# Patient Record
Sex: Male | Born: 1940
Health system: Southern US, Community
[De-identification: ages and names within clinical notes are randomized; demographics above are authoritative.]

## PROBLEM LIST (undated history)

## (undated) DIAGNOSIS — K2970 Gastritis, unspecified, without bleeding: Secondary | ICD-10-CM

## (undated) DIAGNOSIS — I2510 Atherosclerotic heart disease of native coronary artery without angina pectoris: Secondary | ICD-10-CM

## (undated) DIAGNOSIS — N400 Enlarged prostate without lower urinary tract symptoms: Secondary | ICD-10-CM

## (undated) DIAGNOSIS — I5022 Chronic systolic (congestive) heart failure: Secondary | ICD-10-CM

## (undated) DIAGNOSIS — C61 Malignant neoplasm of prostate: Secondary | ICD-10-CM

## (undated) DIAGNOSIS — I480 Paroxysmal atrial fibrillation: Secondary | ICD-10-CM

## (undated) DIAGNOSIS — K635 Polyp of colon: Secondary | ICD-10-CM

## (undated) DIAGNOSIS — G4730 Sleep apnea, unspecified: Secondary | ICD-10-CM

## (undated) DIAGNOSIS — J449 Chronic obstructive pulmonary disease, unspecified: Secondary | ICD-10-CM

## (undated) DIAGNOSIS — E785 Hyperlipidemia, unspecified: Secondary | ICD-10-CM

## (undated) DIAGNOSIS — C439 Malignant melanoma of skin, unspecified: Secondary | ICD-10-CM

## (undated) DIAGNOSIS — K750 Abscess of liver: Secondary | ICD-10-CM

## (undated) DIAGNOSIS — I5020 Unspecified systolic (congestive) heart failure: Secondary | ICD-10-CM

## (undated) DIAGNOSIS — Z87898 Personal history of other specified conditions: Secondary | ICD-10-CM

## (undated) DIAGNOSIS — E049 Nontoxic goiter, unspecified: Secondary | ICD-10-CM

## (undated) DIAGNOSIS — M1990 Unspecified osteoarthritis, unspecified site: Secondary | ICD-10-CM

## (undated) HISTORY — DX: Benign prostatic hyperplasia without lower urinary tract symptoms: N40.0

## (undated) HISTORY — DX: Chronic obstructive pulmonary disease, unspecified: J44.9

## (undated) HISTORY — DX: Hyperlipidemia, unspecified: E78.5

## (undated) HISTORY — DX: Personal history of other specified conditions: Z87.898

## (undated) HISTORY — DX: Abscess of liver: K75.0

## (undated) HISTORY — DX: Chronic systolic (congestive) heart failure: I50.22

## (undated) HISTORY — DX: Paroxysmal atrial fibrillation: I48.0

## (undated) HISTORY — DX: Nontoxic goiter, unspecified: E04.9

## (undated) HISTORY — PX: OTHER SURGICAL HISTORY: SHX169

## (undated) HISTORY — DX: Sleep apnea, unspecified: G47.30

## (undated) HISTORY — DX: Unspecified systolic (congestive) heart failure: I50.20

## (undated) HISTORY — PX: CHOLECYSTECTOMY: SHX55

## (undated) HISTORY — PX: LIVER SURGERY: SHX698

## (undated) HISTORY — DX: Unspecified osteoarthritis, unspecified site: M19.90

## (undated) HISTORY — DX: Gastritis, unspecified, without bleeding: K29.70

## (undated) HISTORY — DX: Malignant melanoma of skin, unspecified: C43.9

---

## 1999-05-30 ENCOUNTER — Encounter: Payer: Self-pay | Admitting: Family Medicine

## 1999-05-30 ENCOUNTER — Ambulatory Visit (HOSPITAL_COMMUNITY): Admission: RE | Admit: 1999-05-30 | Discharge: 1999-05-30 | Payer: Self-pay | Admitting: Family Medicine

## 1999-08-08 ENCOUNTER — Encounter: Payer: Self-pay | Admitting: Thoracic Surgery

## 1999-08-08 ENCOUNTER — Ambulatory Visit (HOSPITAL_COMMUNITY): Admission: RE | Admit: 1999-08-08 | Discharge: 1999-08-08 | Payer: Self-pay | Admitting: Thoracic Surgery

## 1999-08-11 ENCOUNTER — Encounter: Payer: Self-pay | Admitting: Thoracic Surgery

## 1999-08-18 ENCOUNTER — Encounter (INDEPENDENT_AMBULATORY_CARE_PROVIDER_SITE_OTHER): Payer: Self-pay | Admitting: Specialist

## 1999-08-18 ENCOUNTER — Encounter (INDEPENDENT_AMBULATORY_CARE_PROVIDER_SITE_OTHER): Payer: Self-pay | Admitting: *Deleted

## 1999-08-18 ENCOUNTER — Ambulatory Visit (HOSPITAL_COMMUNITY): Admission: RE | Admit: 1999-08-18 | Discharge: 1999-08-18 | Payer: Self-pay | Admitting: Thoracic Surgery

## 1999-08-18 ENCOUNTER — Encounter: Payer: Self-pay | Admitting: Thoracic Surgery

## 1999-08-18 ENCOUNTER — Emergency Department (HOSPITAL_COMMUNITY): Admission: EM | Admit: 1999-08-18 | Discharge: 1999-08-18 | Payer: Self-pay | Admitting: Emergency Medicine

## 1999-10-25 ENCOUNTER — Encounter: Admission: RE | Admit: 1999-10-25 | Discharge: 1999-10-25 | Payer: Self-pay | Admitting: Thoracic Surgery

## 1999-10-25 ENCOUNTER — Encounter: Payer: Self-pay | Admitting: Thoracic Surgery

## 2000-02-14 ENCOUNTER — Encounter: Admission: RE | Admit: 2000-02-14 | Discharge: 2000-02-14 | Payer: Self-pay | Admitting: Thoracic Surgery

## 2000-02-14 ENCOUNTER — Encounter: Payer: Self-pay | Admitting: Thoracic Surgery

## 2000-08-16 ENCOUNTER — Encounter: Payer: Self-pay | Admitting: Thoracic Surgery

## 2000-08-16 ENCOUNTER — Encounter: Admission: RE | Admit: 2000-08-16 | Discharge: 2000-08-16 | Payer: Self-pay | Admitting: Thoracic Surgery

## 2001-03-28 ENCOUNTER — Encounter: Admission: RE | Admit: 2001-03-28 | Discharge: 2001-03-28 | Payer: Self-pay | Admitting: Thoracic Surgery

## 2001-03-28 ENCOUNTER — Encounter: Payer: Self-pay | Admitting: Thoracic Surgery

## 2001-04-16 ENCOUNTER — Encounter: Admission: RE | Admit: 2001-04-16 | Discharge: 2001-04-16 | Payer: Self-pay | Admitting: Family Medicine

## 2001-04-16 ENCOUNTER — Encounter: Payer: Self-pay | Admitting: Family Medicine

## 2001-07-30 ENCOUNTER — Encounter: Admission: RE | Admit: 2001-07-30 | Discharge: 2001-07-30 | Payer: Self-pay | Admitting: General Surgery

## 2001-07-30 ENCOUNTER — Encounter: Payer: Self-pay | Admitting: General Surgery

## 2001-09-14 ENCOUNTER — Emergency Department (HOSPITAL_COMMUNITY): Admission: EM | Admit: 2001-09-14 | Discharge: 2001-09-14 | Payer: Self-pay | Admitting: Emergency Medicine

## 2002-02-18 ENCOUNTER — Encounter: Payer: Self-pay | Admitting: General Surgery

## 2002-02-18 ENCOUNTER — Encounter: Admission: RE | Admit: 2002-02-18 | Discharge: 2002-02-18 | Payer: Self-pay | Admitting: General Surgery

## 2002-10-23 ENCOUNTER — Other Ambulatory Visit: Admission: RE | Admit: 2002-10-23 | Discharge: 2002-10-23 | Payer: Self-pay | Admitting: Family Medicine

## 2003-03-10 ENCOUNTER — Encounter: Admission: RE | Admit: 2003-03-10 | Discharge: 2003-03-10 | Payer: Self-pay | Admitting: General Surgery

## 2003-03-10 ENCOUNTER — Encounter: Payer: Self-pay | Admitting: General Surgery

## 2003-07-29 ENCOUNTER — Emergency Department (HOSPITAL_COMMUNITY): Admission: AD | Admit: 2003-07-29 | Discharge: 2003-07-29 | Payer: Self-pay | Admitting: Family Medicine

## 2003-09-07 ENCOUNTER — Encounter: Admission: RE | Admit: 2003-09-07 | Discharge: 2003-09-07 | Payer: Self-pay | Admitting: Family Medicine

## 2003-11-30 ENCOUNTER — Encounter: Admission: RE | Admit: 2003-11-30 | Discharge: 2003-11-30 | Payer: Self-pay | Admitting: Family Medicine

## 2004-09-19 ENCOUNTER — Ambulatory Visit (HOSPITAL_BASED_OUTPATIENT_CLINIC_OR_DEPARTMENT_OTHER): Admission: RE | Admit: 2004-09-19 | Discharge: 2004-09-19 | Payer: Self-pay | Admitting: Plastic Surgery

## 2004-09-19 ENCOUNTER — Encounter (INDEPENDENT_AMBULATORY_CARE_PROVIDER_SITE_OTHER): Payer: Self-pay | Admitting: *Deleted

## 2004-09-19 ENCOUNTER — Ambulatory Visit (HOSPITAL_COMMUNITY): Admission: RE | Admit: 2004-09-19 | Discharge: 2004-09-19 | Payer: Self-pay | Admitting: Plastic Surgery

## 2005-04-02 ENCOUNTER — Ambulatory Visit: Admission: RE | Admit: 2005-04-02 | Discharge: 2005-04-02 | Payer: Self-pay | Admitting: Family Medicine

## 2005-04-02 DIAGNOSIS — G4730 Sleep apnea, unspecified: Secondary | ICD-10-CM

## 2005-04-02 HISTORY — DX: Sleep apnea, unspecified: G47.30

## 2005-04-23 ENCOUNTER — Ambulatory Visit: Payer: Self-pay | Admitting: Internal Medicine

## 2005-05-23 ENCOUNTER — Encounter (INDEPENDENT_AMBULATORY_CARE_PROVIDER_SITE_OTHER): Payer: Self-pay | Admitting: Internal Medicine

## 2005-05-23 ENCOUNTER — Ambulatory Visit: Payer: Self-pay | Admitting: Internal Medicine

## 2005-05-23 ENCOUNTER — Ambulatory Visit (HOSPITAL_COMMUNITY): Admission: RE | Admit: 2005-05-23 | Discharge: 2005-05-23 | Payer: Self-pay | Admitting: Internal Medicine

## 2006-07-24 ENCOUNTER — Encounter (INDEPENDENT_AMBULATORY_CARE_PROVIDER_SITE_OTHER): Payer: Self-pay | Admitting: Specialist

## 2006-07-24 ENCOUNTER — Ambulatory Visit: Payer: Self-pay | Admitting: Internal Medicine

## 2006-07-24 ENCOUNTER — Ambulatory Visit (HOSPITAL_COMMUNITY): Admission: RE | Admit: 2006-07-24 | Discharge: 2006-07-24 | Payer: Self-pay | Admitting: Internal Medicine

## 2006-07-29 ENCOUNTER — Emergency Department (HOSPITAL_COMMUNITY): Admission: EM | Admit: 2006-07-29 | Discharge: 2006-07-29 | Payer: Self-pay | Admitting: Emergency Medicine

## 2006-12-09 ENCOUNTER — Emergency Department (HOSPITAL_COMMUNITY): Admission: EM | Admit: 2006-12-09 | Discharge: 2006-12-09 | Payer: Self-pay | Admitting: Emergency Medicine

## 2006-12-25 ENCOUNTER — Ambulatory Visit (HOSPITAL_COMMUNITY): Admission: RE | Admit: 2006-12-25 | Discharge: 2006-12-25 | Payer: Self-pay | Admitting: Internal Medicine

## 2008-05-13 ENCOUNTER — Ambulatory Visit (HOSPITAL_COMMUNITY): Admission: RE | Admit: 2008-05-13 | Discharge: 2008-05-13 | Payer: Self-pay | Admitting: Internal Medicine

## 2008-05-28 DIAGNOSIS — Z87898 Personal history of other specified conditions: Secondary | ICD-10-CM

## 2008-05-28 HISTORY — DX: Personal history of other specified conditions: Z87.898

## 2008-06-28 HISTORY — PX: CARDIAC CATHETERIZATION: SHX172

## 2008-07-01 ENCOUNTER — Encounter: Admission: RE | Admit: 2008-07-01 | Discharge: 2008-07-01 | Payer: Self-pay | Admitting: Cardiology

## 2008-07-06 ENCOUNTER — Inpatient Hospital Stay (HOSPITAL_COMMUNITY): Admission: RE | Admit: 2008-07-06 | Discharge: 2008-07-07 | Payer: Self-pay | Admitting: Cardiology

## 2008-07-07 ENCOUNTER — Ambulatory Visit: Payer: Self-pay | Admitting: Vascular Surgery

## 2008-07-07 ENCOUNTER — Encounter (INDEPENDENT_AMBULATORY_CARE_PROVIDER_SITE_OTHER): Payer: Self-pay | Admitting: Cardiology

## 2008-07-28 HISTORY — PX: OTHER SURGICAL HISTORY: SHX169

## 2008-09-24 ENCOUNTER — Encounter: Admission: RE | Admit: 2008-09-24 | Discharge: 2008-09-24 | Payer: Self-pay | Admitting: Cardiology

## 2009-12-16 ENCOUNTER — Ambulatory Visit (HOSPITAL_COMMUNITY): Admission: RE | Admit: 2009-12-16 | Discharge: 2009-12-16 | Payer: Self-pay | Admitting: Internal Medicine

## 2010-06-20 ENCOUNTER — Other Ambulatory Visit: Payer: Self-pay | Admitting: Dermatology

## 2010-09-12 LAB — CBC
HCT: 44.4 % (ref 39.0–52.0)
MCHC: 34.7 g/dL (ref 30.0–36.0)
MCV: 87.8 fL (ref 78.0–100.0)
MCV: 89.3 fL (ref 78.0–100.0)
Platelets: 130 10*3/uL — ABNORMAL LOW (ref 150–400)
Platelets: 130 10*3/uL — ABNORMAL LOW (ref 150–400)
RDW: 13.7 % (ref 11.5–15.5)
WBC: 8 10*3/uL (ref 4.0–10.5)

## 2010-09-12 LAB — POCT I-STAT 3, ART BLOOD GAS (G3+)
Bicarbonate: 24.9 mEq/L — ABNORMAL HIGH (ref 20.0–24.0)
O2 Saturation: 90 %
TCO2: 26 mmol/L (ref 0–100)
pO2, Arterial: 61 mmHg — ABNORMAL LOW (ref 80.0–100.0)

## 2010-09-12 LAB — CARDIAC PANEL(CRET KIN+CKTOT+MB+TROPI)
Total CK: 116 U/L (ref 7–232)
Troponin I: 0.29 ng/mL — ABNORMAL HIGH (ref 0.00–0.06)

## 2010-09-12 LAB — BASIC METABOLIC PANEL
BUN: 13 mg/dL (ref 6–23)
Chloride: 104 mEq/L (ref 96–112)
Creatinine, Ser: 0.88 mg/dL (ref 0.4–1.5)
GFR calc Af Amer: >60 mL/min (ref >60)
GFR calc non Af Amer: >60 mL/min (ref >60)
Potassium: 3.7 mEq/L (ref 3.5–5.1)

## 2010-09-12 LAB — POCT I-STAT 3, VENOUS BLOOD GAS (G3P V)
Acid-base deficit: 6 mmol/L — ABNORMAL HIGH (ref 0.0–2.0)
O2 Saturation: 65 %

## 2010-10-10 NOTE — Cardiovascular Report (Signed)
NAMEESIQUIO, BOESEN NO.:  [id number]   MEDICAL RECORD NO.:  [id number]           PATIENT TYPE:   LOCATION:                                 FACILITY:   PHYSICIAN:  Sheliah Mends, MD      DATE OF BIRTH:  09/20/1940   DATE OF PROCEDURE:  DATE OF DISCHARGE:                            CARDIAC CATHETERIZATION   CARDIOLOGISTS:  1. Nicki Guadalajara, MD  2. Sheliah Mends, MD   INDICATION:  This is a 70 year old gentleman who presented initially  with an episode of syncope.  He has multiple risk factors for coronary  artery disease and subsequently completed a cardiac event monitor and  myocardial perfusion scan.  His cardiac event monitor showed episodes of  paroxysmal atrial fibrillation with rapid ventricular response, as well  as episodes of frequent ventricular ectopy and nonsustained ventricular  tachycardia.  His myocardial perfusion scan showed only mild  abnormalities but no area of severe ischemia.  Given his initial high-  risk presentation with syncope in context of frequent ventricular  ectopy, as well as his risk factors, a decision was made to proceed to  cardiac catheterization.   TECHNIQUE:  After informed consent was obtained, the patient was brought  to the second floor cardiac cath lab at [facility] for right  and left heart catheterization.  The patient was started on conscious  sedation using Versed and fentanyl.  In addition, he received local  anesthesia using 1% lidocaine into the right groin.  Arterial and venous  access were established using the modified Seldinger technique.  A 5 and  a 7-French sheaths were placed into the right femoral artery and right  femoral vein respectively.  Right heart catheterization was performed  using Swan-Ganz catheter.  Left heart catheterization including  selective native coronary angiography and left ventriculography was  performed using a right and left 5-French Judkins catheter, as well as a  pigtail catheter.   FINDINGS:  Right heart catheterization:  RA mean 5 mmHg, RV 29/4 mmHg,  PA 29/10 mmHg, PCWP mean of 10 mmHg.   Left heart catheterization:  Hemodynamics:  Left ventricular pressure 125/5 mmHg.  Aortic pressure  140/71 mmHg.  There was no significant aortic gradient noted on  pullback.  Left ventriculogram showed mildly reduced left ventricular  function of 50% with an anterolateral hypokinesis.   Coronary angiography:  The left main artery is a large-caliber, short  vessel that trifurcates into the LAD, ramus intermedius, and left  circumflex artery.  The LAD is a large-caliber, long vessel that wraps  around the apex.  It gives off 2 small diagonal artery branches.  The  LAD has a tandem lesion in the proximal with the first lesion located in  the proximal segment, right before the takeoff of the diagonal artery,  and the second lesion in the mid segment.  Both lesions are estimated at  80%.  There are some mild luminal irregularities in the mid and distal  segment.  In addition, there is a 40% lesion in the distal segment.   Ramus intermedius:  The ramus intermedius  is a mid size branching artery  that has mild, 40% lesion in the proximal segment.   Left circumflex artery:  The left circumflex artery is a medium-sized  vessel that gives off 2 smaller obtuse marginal branches.  The left  circumflex artery has no angiographically significant coronary artery  disease.   Right coronary artery:  The right coronary artery is a large-caliber  dominant vessel that gives off the PDA.  There is no angiographic data.  Mild luminal irregularities seen in the proximal and mid segment of the  RCA without any angiographically significant lesion.   SUMMARY:  1. Single-vessel coronary artery disease with a hemodynamically      significant tandem lesion in the proximal and mid left anterior      descending.  In addition, there is a mild, 40% lesion in the distal      left  anterior descending.  2. Mild coronary artery disease in the ramus intermedius with a 40%      lesion in the proximal segment.  3. Mild diffuse irregularities in the right coronary artery without      hemodynamic significant lesion.  4. Mildly reduced left ventricular function with an ejection fraction      of 50% and a small area of mild hypokinesis in the anterolateral      segment.  5. Normal right heart pressures.   The procedure was completed without complication.  The patient proceeded  to percutaneous coronary intervention of the left anterior descending  with Dr. Nicki Guadalajara.   Dr. Nicki Guadalajara was present throughout the entire procedure and  proctored the case.      Sheliah Mends, MD  Electronically Signed     JE/MEDQ  D:  07/06/2008  T:  07/06/2008  Job:  [id number]

## 2010-10-10 NOTE — Cardiovascular Report (Signed)
NAMESAHIL, MILNER NO.:  [id number]   MEDICAL RECORD NO.:  [id number]          PATIENT TYPE:  INP   LOCATION:  2504                         FACILITY:  [facility]   PHYSICIAN:  Nicki Guadalajara, M.D.     DATE OF BIRTH:  March 22, 1941   DATE OF PROCEDURE:  07/06/2008  DATE OF DISCHARGE:                            CARDIAC CATHETERIZATION   PROCEDURE:  Percutaneous coronary intervention of the left anterior  descending artery with primary stenting done with double bolus  Integrilin/weight-adjusted heparinization/oral Plavix/intracoronary  nitroglycerin.   INDICATIONS:  Mr. Deforrest Bogle is a 70 year old patient who was  referred to Dr. Sheliah Mends for evaluation of syncope.  He has a  history of non.  He had recently undergone a nuclear stress test which  revealed subtle abnormalities anteriorly.  Dr. Garen Lah admitted him  today for diagnostic cardiac catheterization.  Please refer to his  comprehensive catheterization report.  He is now referred for  intervention to the left anterior descending artery.   PROCEDURE IN DETAIL:  The right femoral artery and right femoral vein  catheters were still in place from the diagnostic procedure.  The 5-  French arterial catheter was upgraded to a 6-French system.  The patient  was given 600 mg oral Plavix.  He also was started on double bolus  Integrilin and received 4300 weight-adjusted heparinization.  A 6-French  XB 3.5 guide was used for the interventional procedure.  IC  nitroglycerin was administered down the LAD to again make certain there  was not significant spasm contributing to the tandem high-grade  stenoses.  An ATW marker wire was used to help with the lesion length  such that both lesions could be covered with one stent.  Consequently, a  3.5 x 33 mm Cypher stent was then inserted which was necessary to  adequately cover both sites.  This was dilated x2 to 14 atmospheres.  Post-stent dilatation was done utilizing a  4.0 x 20 mm noncompliant  Voyager balloon.  This was dilated to create a vessel taper such that  resulting at 4.0 at the proximal portion of the stent and tapering down  at 3.83 at the most distal aspect of the stent.  Due to still an area of  waste at the more proximal site, a smaller 4.0 x 8 mm noncompliant  Voyager balloon was then used to allow for high-pressure forces at this  site.  This was dilated up to 14 atmospheres corresponding to a 4.01 mm  size.  Scout angiography confirmed an excellent angiographic result.  There was brisk TIMI III flow.  There was no evidence for dissection.   HEMODYNAMIC DATA:  Central aortic pressure was 121/74.   At the start of the interventional procedure, the LAD after the first  septal perforating artery had 80-85% eccentric narrowing just after the  septal perforating artery and just proximal to the first diagonal  vessel.  Just beyond this diagonal vessel, there was an 80-90% stenosis  in the segment between the first and second diagonal vessel.  Following  placement of the 3.5 x 33 mm Cypher stent, both  lesions were covered.  Following post-stent dilatation with vessel taper from 4.0 proximally to  3.83 in the distal aspect of the stent, the entire region was reduced to  0%.  There was brisk TIMI III flow.  There was no evidence for  dissection.   IMPRESSION:  Successful primary stenting of tandem lesions in the  proximal to mid left anterior descending artery with 85% tandem stenoses  being reduced to 0% with insertion of a 3.5 x 33 mm DES Cypher stent  post-dilated to a vessel taper of 4.0 to 3.83 mm done with double bolus  Integrilin/weight-adjusted heparinization/600 mg oral  Plavix/intracoronary nitroglycerin.           ______________________________  Nicki Guadalajara, M.D.     TK/MEDQ  D:  07/06/2008  T:  07/06/2008  Job:  [id number]   cc:   Sheliah Mends, MD  Nita Sells, M.D.

## 2010-10-10 NOTE — Op Note (Signed)
NAME:  Alexander Burton, Alexander Burton               ACCOUNT NO.:  [id number]   MEDICAL RECORD NO.:  [id number]          PATIENT TYPE:  AMB   LOCATION:  DAY                           FACILITY:  [facility]   PHYSICIAN:  Lionel December, M.D.    DATE OF BIRTH:  Nov 14, 1940   DATE OF PROCEDURE:  05/13/2008  DATE OF DISCHARGE:                               OPERATIVE REPORT   PROCEDURES:  Esophagogastroduodenoscopy with esophageal dilation.   INDICATION:  Zaion is a 70 year old Caucasian male with a previous  history of GERD who decided to stop his Nexium on his own, but he had  relapse of his symptoms on a daily basis.  He also started to experience  solid food dysphagia.  He has gone back to his Nexium and is feeling  much better.  His dysphagia is also improved, but not 100%.  He still  has hoarseness.  He is undergoing diagnostic/therapeutic EGD.  Procedure  and risks were reviewed with the patient and informed consent was  obtained.   MEDICATIONS FOR CONSCIOUS SEDATION:  Cetacaine spray for pharyngeal  topical anesthesia.  Demerol 50 mg IV, Versed 5 mg IV in divided doses.   FINDINGS:  Procedure performed in endoscopy suite.  The patient's vital  signs and O2 sats were monitored during the procedure and remained  stable.  The patient was placed in left lateral recumbent position and  Pentax video scope was passed through oropharynx without any difficulty  into esophagus.   Esophagus:  Mucosa of the esophagus is normal.  He had Schatzki ring,  which is not critical.  The GE junction was at 36 cm from the incisors  and hiatus was at 39.  Mucosa of the herniated part of the stomach was  normal.   Stomach was empty and distended very well with insufflation.  Folds of  proximal stomach were normal.  Examination of the mucosa and body was  normal.  He had patchy erythema and a few erosions at antrum, but no  ulcer crater was noted.  Pylorus exam was patent.  Angularis, fundus,  and cardia were examined by  retroflexing the scope and were normal.   Duodenum:  Bulbar mucosa revealed patchy edema and erythema without  erosions or ulceration.  The scope was passed to the second part of the  duodenum.  The mucosa and folds are normal.  Endoscope was withdrawn.   Esophagus was dilated by passing 56-French Maloney dilator in full  insertion.  Esophageal mucosa was reexamined postdilation, and ring was  noted to have been disrupted.  Pictures taken for the record.  Endoscope  was withdrawn.  The patient tolerated the procedure well.   FINAL DIAGNOSES:  1. No evidence of erosive esophagitis or Barrett esophagus.  2. Schatzki ring dilated/disrupted by passing 56-French Maloney      dilator.  3. Small sliding hiatal hernia.  4. Erosive antral gastritis and bulbar duodenitis.   RECOMMENDATIONS:  1. Antireflux measures reinforced.  2. He will continue the Nexium at 40 mg p.o. q.a.m.  We will check his      H. pylori serology today.  Lionel December, M.D.  Electronically Signed     NR/MEDQ  D:  05/13/2008  T:  05/14/2008  Job:  [id number]   cc:   Catalina Pizza, M.D.  Fax: [phone]

## 2010-10-13 NOTE — Discharge Summary (Signed)
Alexander Burton, Alexander Burton NO.:  [id number]   MEDICAL RECORD NO.:  [id number]          PATIENT TYPE:  INP   LOCATION:  2504                         FACILITY:  [facility]   PHYSICIAN:  Sheliah Mends, MD      DATE OF BIRTH:  [birth year]   DATE OF ADMISSION:  07/06/2008  DATE OF DISCHARGE:  07/07/2008                               DISCHARGE SUMMARY   DISCHARGE DIAGNOSES:  1. Syncope.  2. Increased ventricular ectopy with nonsustained ventricular      tachycardia.  3. Paroxysmal atrial fibrillation.  4. Coronary artery disease with percutaneous transluminal coronary      angioplasty and stent deployment to the left anterior descending,      reducing 80% stenosis to 0 and residual nonobstructive disease.  5. Mildly decreased ejection fraction of 50%.  6. 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   DISCHARGE MEDICATIONS:  1. Finasteride 5 mg daily.  2. Nexium 40 mg twice a day.  3. Coreg 3.125 twice a day.  4. Fish oil 1 capsule daily.  5. Multivitamin 1 daily.  6. AndroGel 4 mg daily.  7. Plavix 75 mg 1 daily, do not stop, stopping could cause a heart      attack.  8. Enteric-coated aspirin 325 mg daily.  9. Nitroglycerin 1/150th 1 sublingual as needed for chest pain while      sitting, 1 every 5 minutes up to 3 in 15.   DISCHARGE INSTRUCTIONS:  1. No work for 1 week.  May return in 1 week.  2. Low-sodium heart-healthy diet.  3. Wash cath site with soap and water.  Call if any bleeding,      swelling, or drainage.  4. Increase activity slowly.  5. May shower.  6. No lifting for 1 week.  No driving for 4 days.  7. Follow up with Dr. Garen Lah on July 14, 2008, at 12 noon.  8. No lifting over 8 pounds for 1 week at least.  9. He was scheduled for right groin arterial Dopplers because of an      area in the groin on July 12, 2008, at 7:00 a.m. on the second      floor of Dr. Gretta Began office.   DISCHARGE CONDITION:   Improved.   PROCEDURES:  1. Combined left heart catheterization on July 06, 2008, by Dr.      Garen Lah.  2. On July 06, 2008, percutaneous transluminal coronary angioplasty      and stent deployment with a stent by Dr. Nicki Guadalajara.   HISTORY OF PRESENT ILLNESS:  A 70 year old gentleman that was seen by  Dr. Garen Lah secondary to irregular heart beats and nonsustained  ventricular tachycardia while he was wearing CardioNet, as well as PAF.  He had a nuclear study, which showed attenuation defect in the inferior  region of the myocardium and an EF was 47%.  The patient was  asymptomatic with the arrhythmias.  Because of significant arrhythmias  and an episode of prior syncope, leading to the CardioNet, the patient  was  brought in for cardiac catheterization with results as stated  previously.  The patient underwent procedure and did well.  By the  morning of July 07, 2008, he did have right groin hematoma,  underwent an ultrasound, which showed a pseudoaneurysm in the common  femoral artery.  I discussed with Dr. Jacinto Halim, he felt the patient was  stable and could be discharged home and follow up for repeat ultrasound  on the following Monday.   The patient was aware if the pain became worse, he would need to return.  Call the office for further instructions.      Darcella Gasman. Annie Paras, N.P.      Sheliah Mends, MD  Electronically Signed    LRI/MEDQ  D:  08/03/2008  T:  08/04/2008  Job:  [id number]   cc:   Catalina Pizza, M.D.

## 2010-10-13 NOTE — Procedures (Signed)
NAMEJAYD, Alexander Burton NO.:  [id number]   MEDICAL RECORD NO.:  [id number]          PATIENT TYPE:  OUT   LOCATION:  SLEEP LAB                     FACILITY:  [facility]   PHYSICIAN:  Marcelyn Bruins, M.D. [facility] DATE OF BIRTH:  October 27, 1940   DATE OF STUDY:  04/02/2005                              NOCTURNAL POLYSOMNOGRAM   REFERRING PHYSICIAN:  Dr. Foster Simpson   INDICATION FOR THE STUDY:  Hypersomnia with sleep apnea.   EPWORTH SCORE:  12   SLEEP ARCHITECTURE:  The patient had a total sleep time of 298 minutes with  adequate slow wave sleep but decreased REM. Sleep onset latency was mildly  prolonged at 33 minutes, and REM onset was fairly rapid at 22 minutes. Sleep  efficiency was 77%.   RESPIRATORY DATA:  The patient was found to have 52 hypopneas and 75 apneas  for a respiratory disturbance index of 26 events per hour. Events were not  positional. There was no comment by the sleep technician regarding level of  snoring. The patient did not meet split night criteria secondary to the  majority of his events occurring after 2:00 a.m.   OXYGEN DATA:  The patient had O2 desaturation as low as 82% associated with  his obstructive events.   CARDIAC DATA:  There were frequent PACs and PVCs noted throughout.   MOVEMENT/PARASOMNIA:  The patient had 37 leg jerks during the study with  very little sleep disruption.   IMPRESSION/RECOMMENDATION:  Moderate obstructive sleep apnea with a  respiratory disturbance index of 26 events per hour and oxygen desaturation  as low as 82%. The patient did not meet split night criteria secondary to  most of the events occurring after 2:00 a.m.Marland Kitchen Treatment for this degree of  sleep apnea may include weight loss if applicable, upper airway surgery,  oral appliance, and CPAP. Clinical correlation is suggested. If treatment is  undertaken with CPAP, the patient will either need to have a formal pressure  titration in the sleep lab or with an auto  titrate device.                                           ______________________________  Marcelyn Bruins, M.D. [facility]  Diplomate, American Board of Sleep  Medicine    KC/MEDQ  D:  04/17/2005 08:47:28  T:  04/17/2005 30:86:57  Job:  [id number]

## 2010-10-13 NOTE — Op Note (Signed)
NAME:  Alexander Burton, Alexander Burton               ACCOUNT NO.:  [id number]   MEDICAL RECORD NO.:  [id number]          PATIENT TYPE:  AMB   LOCATION:  DAY                           FACILITY:  [facility]   PHYSICIAN:  Lionel December, M.D.    DATE OF BIRTH:  [birth year]   DATE OF PROCEDURE:  05/23/2005  DATE OF DISCHARGE:                                 OPERATIVE REPORT   PROCEDURE:  Colonoscopy with polypectomy.   INDICATION:  Alexander Burton is a 70 year old Caucasian male who is here for screening  colonoscopy. Family history is negative for colorectal carcinoma. Procedure  and risks were reviewed with the patient, and informed consent was obtained.   MEDICATIONS FOR CONSCIOUS SEDATION:  Demerol 25 mg IV, Versed 4 mg IV.   FINDINGS:  Procedure performed in endoscopy suite. The patient's vital signs  and O2 saturation were monitored during the procedure and remained stable.  The patient was placed in left lateral position. Rectal examination  performed. No abnormality noted on external or digital exam. Olympus  videoscope was placed in rectum and advanced under vision into sigmoid colon  and beyond. Few small diverticula were noted at sigmoid colon. Scope was  passed to cecum which was identified by appendiceal orifice and ileocecal  valve. This was a sessile polyp to the right of appendiceal orifice. This  was raised with submucosal injection of normal saline and snared piecemeal.  There was good hemostasis. Most of these polyps were retrieved for  histologic examination. There was a small less than 1-cm size submucosal  lipoma at ascending colon. There was another polyp at mid transverse colon  which was elongated, over centimeter long, but it appeared to be  hyperplastic. Multiple biopsies were taken for histology and during this  process was partially ablated. Mucosa of the rest of colon was normal.  Rectal mucosa similarly was normal. Scope was retroflexed to examine  anorectal junction, and small hemorrhoids  noted below the dentate line.  Endoscope was straightened and withdrawn. The patient tolerated the  procedure well.   FINAL DIAGNOSES:  1.  Sessile polyp at cecum which was snared piecemeal after lifting of a      submucosal injection of normal saline.  2.  Another polyp at transverse colon was biopsied for histology.      Endoscopically suspicious for hyperplastic polyp.  3.  Small submucosal lipoma at the ascending colon.  4.  Few diverticula at sigmoid colon.  5.  External hemorrhoids.   RECOMMENDATIONS:  No aspirin for 10 days. High-fiber diet which he will  start next week.   I will be contacting the patient with biopsy results and further  recommendations.      Lionel December, M.D.  Electronically Signed     NR/MEDQ  D:  05/23/2005  T:  05/23/2005  Job:  [id number]

## 2010-10-13 NOTE — Op Note (Signed)
NAMEARIS, EVEN NO.:  [id number]   MEDICAL RECORD NO.:  [id number]          PATIENT TYPE:  AMB   LOCATION:  DSC                          FACILITY:  [facility]   PHYSICIAN:  Consuello Bossier., M.D.DATE OF BIRTH:  1940-11-11   DATE OF PROCEDURE:  09/19/2004  DATE OF DISCHARGE:                                 OPERATIVE REPORT   PREOPERATIVE DIAGNOSES:  1.  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  2.  Dysplastic nevus, 1 cm, right forehead scalp area, with resultant 2.5 cm      closure.   POSTOPERATIVE DIAGNOSES:  1.  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  2.  Dysplastic nevus, 1 cm, right forehead scalp area, with resultant 2.5 cm      closure.   SURGEON:  Pleas Patricia, M.D.   ANESTHESIA:  Xylocaine 2% with epinephrine 1:100,000.   FINDINGS:  The patient had the above lesions, which were treated by wide  local excision and primary closure as noted above.   PROCEDURE:  The patient was brought to the operating room and marked off for  the planned elliptical excision of the right upper forehead scalp lesion as  well as the right buttock lesion.  He was prepped with Betadine and draped  sterilely, anesthetized with Xylocaine 2% with epinephrine 1:100,000.  Following the onset of anesthesia, the forehead lesion was removed as an  excisional biopsy.  The wound was then closed in interrupted running 5-0  Prolene, resulting in a 2.5 cm linear closure.  Attention was then turned to  the right buttock area, where an even wider local excisional biopsy was  performed.  This wound, which was a 5 cm in length complex______ wound, was  then closed with interrupted subcutaneous 4-0 Monocryl followed by a running  subcuticular 4-0 Monocryl with then the application of Steri-Strips.  Light  compressive dressings were applied.  The patient tolerated  the procedure  well and was able to be discharged from the operating room to be followed by  me as an outpatient.      HH/MEDQ  D:  09/19/2004  T:  09/19/2004  Job:  [id number]

## 2010-10-13 NOTE — Op Note (Signed)
NAME:  Alexander Burton, Alexander Burton               ACCOUNT NO.:  [id number]   MEDICAL RECORD NO.:  [id number]          PATIENT TYPE:  AMB   LOCATION:  DAY                           FACILITY:  [facility]   PHYSICIAN:  Lionel December, M.D.    DATE OF BIRTH:  [birth year]   DATE OF PROCEDURE:  07/24/2006  DATE OF DISCHARGE:                               OPERATIVE REPORT   PROCEDURE:  Colonoscopy with polypectomy.   INDICATIONS:  Alexander Burton is 70 year old Caucasian male who underwent  colonoscopy in December 2006 with piecemeal polypectomy of a sessile  polyp involving blunted cecum.  He had another adenoma removed from his  transverse colon.  He was advised to come back in a year to make sure he  does not have any residual polyp at cecum.  He is free of GI symptoms.  Procedure risks were reviewed with the patient, informed consent was  obtained.   MEDS FOR CONSCIOUS SEDATION:  Demerol 25 mg IV, Versed 4 mg IV.   FINDINGS:  Procedure performed in endoscopy suite.  The patient's vital  signs and O2 sat were monitored during procedure and remained stable.  The patient's preparation was satisfactory except he had stool coating  the ascending colon and cecum which had to be vigorously washed to  examine the underlying mucosa.  Cecal landmarks were well seen.  Scar  was seen at polypectomy site above and to the right of appendiceal  orifice.  Pictures taken for the record.  There was no residual polyp.  As the scope was withdrawn colonic mucosa was carefully examined.  There  was 8 to 10 mL sessile polyp at midtransverse colon.  This was very  subtle.  A submucosal saline was injected.  This polyp could never be  snared.  Change in position did not help.  Multiple biopsies were taken  from this polyp and residual polyp was coagulated using snare tip.  Polypectomy was felt to be complete.  Pictures taken for the record.  There was another small polyp at distal transverse colon which was  ablated via cold biopsy.   Mucosa rest of the colon was normal.  There  were few scattered diverticula at sigmoid colon.  Rectal mucosa was  normal.  Scope was retroflexed examine anorectal junction which was  unremarkable.  Endoscope was then withdrawn.  The patient tolerated the  procedure well.   FINAL DIAGNOSIS:  1. No evidence of residual or recurrent polyp at cecum.  2. Small polyp ablated via cold biopsy from distal transverse colon.      8 to 10 mL sessile polyp in mid transverse colon which could not be      snared after the submucosal injection of saline.  It was biopsied      and rest of it was coagulated.  3. Few diverticula at sigmoid colon.   RECOMMENDATIONS:  High-fiber diet.  No aspirin for 10 days.  I will be  contacting the patient with results of biopsy and further  recommendations.      Lionel December, M.D.  Electronically Signed     NR/MEDQ  D:  07/24/2006  T:  07/24/2006  Job:  [id number]   cc:   Catalina Pizza, M.D.  Fax: [phone]

## 2011-03-02 LAB — H. PYLORI ANTIBODY, IGG: H Pylori IgG: 0.4 {ISR}

## 2011-03-25 ENCOUNTER — Emergency Department (HOSPITAL_COMMUNITY): Payer: Medicare Other

## 2011-03-25 ENCOUNTER — Encounter: Payer: Self-pay | Admitting: Internal Medicine

## 2011-03-25 ENCOUNTER — Inpatient Hospital Stay (HOSPITAL_COMMUNITY): Payer: Medicare Other

## 2011-03-25 ENCOUNTER — Inpatient Hospital Stay (HOSPITAL_COMMUNITY)
Admission: EM | Admit: 2011-03-25 | Discharge: 2011-03-29 | DRG: 442 | Disposition: A | Discharge status: Home Health | Payer: Medicare Other | Attending: Internal Medicine | Admitting: Internal Medicine

## 2011-03-25 DIAGNOSIS — Z860100 Personal history of colon polyps, unspecified: Secondary | ICD-10-CM

## 2011-03-25 DIAGNOSIS — Z87891 Personal history of nicotine dependence: Secondary | ICD-10-CM

## 2011-03-25 DIAGNOSIS — I4891 Unspecified atrial fibrillation: Secondary | ICD-10-CM | POA: Diagnosis present

## 2011-03-25 DIAGNOSIS — E042 Nontoxic multinodular goiter: Secondary | ICD-10-CM | POA: Diagnosis present

## 2011-03-25 DIAGNOSIS — K5730 Diverticulosis of large intestine without perforation or abscess without bleeding: Secondary | ICD-10-CM | POA: Diagnosis present

## 2011-03-25 DIAGNOSIS — I10 Essential (primary) hypertension: Secondary | ICD-10-CM | POA: Diagnosis present

## 2011-03-25 DIAGNOSIS — R7881 Bacteremia: Secondary | ICD-10-CM | POA: Diagnosis present

## 2011-03-25 DIAGNOSIS — Z85820 Personal history of malignant melanoma of skin: Secondary | ICD-10-CM

## 2011-03-25 DIAGNOSIS — Z79899 Other long term (current) drug therapy: Secondary | ICD-10-CM

## 2011-03-25 DIAGNOSIS — Z9861 Coronary angioplasty status: Secondary | ICD-10-CM

## 2011-03-25 DIAGNOSIS — K750 Abscess of liver: Principal | ICD-10-CM | POA: Diagnosis present

## 2011-03-25 DIAGNOSIS — C61 Malignant neoplasm of prostate: Secondary | ICD-10-CM | POA: Diagnosis present

## 2011-03-25 DIAGNOSIS — I2510 Atherosclerotic heart disease of native coronary artery without angina pectoris: Secondary | ICD-10-CM | POA: Diagnosis present

## 2011-03-25 DIAGNOSIS — Z7982 Long term (current) use of aspirin: Secondary | ICD-10-CM

## 2011-03-25 HISTORY — DX: Atherosclerotic heart disease of native coronary artery without angina pectoris: I25.10

## 2011-03-25 HISTORY — DX: Malignant neoplasm of prostate: C61

## 2011-03-25 LAB — COMPREHENSIVE METABOLIC PANEL
Albumin: 2.6 g/dL — ABNORMAL LOW (ref 3.5–5.2)
Alkaline Phosphatase: 77 U/L (ref 39–117)
BUN: 26 mg/dL — ABNORMAL HIGH (ref 6–23)
Calcium: 8.7 mg/dL (ref 8.4–10.5)
Creatinine, Ser: 1.09 mg/dL (ref 0.50–1.35)
GFR calc Af Amer: 76 mL/min — ABNORMAL LOW (ref >90)
Glucose, Bld: 126 mg/dL — ABNORMAL HIGH (ref 70–99)
Potassium: 3.5 mEq/L (ref 3.5–5.1)
Total Protein: 5.8 g/dL — ABNORMAL LOW (ref 6.0–8.3)

## 2011-03-25 LAB — CBC
HCT: 36.7 % — ABNORMAL LOW (ref 39.0–52.0)
Hemoglobin: 12.6 g/dL — ABNORMAL LOW (ref 13.0–17.0)
MCV: 86.8 fL (ref 78.0–100.0)
RBC: 4.23 MIL/uL (ref 4.22–5.81)
RDW: 13 % (ref 11.5–15.5)
WBC: 12.2 10*3/uL — ABNORMAL HIGH (ref 4.0–10.5)

## 2011-03-25 LAB — DIFFERENTIAL
Basophils Absolute: 0 10*3/uL (ref 0.0–0.1)
Eosinophils Relative: 0 % (ref 0–5)
Lymphocytes Relative: 3 % — ABNORMAL LOW (ref 12–46)
Lymphs Abs: 0.3 10*3/uL — ABNORMAL LOW (ref 0.7–4.0)
Neutro Abs: 11.3 10*3/uL — ABNORMAL HIGH (ref 1.7–7.7)
Neutrophils Relative %: 93 % — ABNORMAL HIGH (ref 43–77)

## 2011-03-25 LAB — URINALYSIS, ROUTINE W REFLEX MICROSCOPIC
Hgb urine dipstick: NEGATIVE
Specific Gravity, Urine: 1.03 (ref 1.005–1.030)
Urobilinogen, UA: 1 mg/dL (ref 0.0–1.0)
pH: 5.5 (ref 5.0–8.0)

## 2011-03-25 LAB — URINE MICROSCOPIC-ADD ON

## 2011-03-25 LAB — HEPATIC FUNCTION PANEL
Albumin: 2.6 g/dL — ABNORMAL LOW (ref 3.5–5.2)
Alkaline Phosphatase: 73 U/L (ref 39–117)
Total Bilirubin: 1.9 mg/dL — ABNORMAL HIGH (ref 0.3–1.2)

## 2011-03-25 LAB — POCT I-STAT TROPONIN I: Troponin i, poc: 0.07 ng/mL (ref 0.00–0.08)

## 2011-03-25 LAB — LACTATE DEHYDROGENASE: LDH: 218 U/L (ref 94–250)

## 2011-03-25 LAB — LIPASE, BLOOD: Lipase: 15 U/L (ref 11–59)

## 2011-03-25 LAB — LACTIC ACID, PLASMA: Lactic Acid, Venous: 1.5 mmol/L (ref 0.5–2.2)

## 2011-03-25 MED ORDER — IOHEXOL 300 MG/ML  SOLN: 100.0000 mL | Freq: Once | INTRAMUSCULAR | Status: AC | PRN

## 2011-03-25 NOTE — H&P (Signed)
Hospital Admission Note Date: 03/25/2011  Patient name:  Alexander Burton   Medical record number:  [id number] Date of birth:  01/05/1941    Age: 70 y.o. Gender:  male PCP:    Dr. Kathleene Hazel. Ezequiel Ganser     Medical Service:   Internal Medicine Teaching Service   Attending physician:  Dr. Cliffton Asters First Contact:   Dr. Margorie John Pager: [phone]  Second Contact:   Dr. Saralyn Pilar Pager: [phone] After Hours:    First Contact  Pager: [phone]      Second Contact  Pager: [phone]   Chief Complaint: Weakness, nausea, vomiting x 2 weeks  History of Present Illness: Patient is a 70 y.o. male with a PMHx of CAD s/p stenting, BPH, HTN who presents to [facility] for evaluation of weakness x2 weeks and nausea and vomiting times one week. The patient notes that he was in his usual state of health about 2 weeks ago at which time he noted increased weakness and fatigue, secondary to which he had trouble with daily day-to-day activities. The weakness and fatigue continued to persist, then approximately one week ago he had severe nausea and vomiting x one day of mainly food products and otherwise nonbilious and nonbloody vomitus. During this one week prior to admission he continued to have decreased appetite and some intermittent nausea but no vomiting. However on the evening prior to admission the patient again had an multiple vomiting episodes and rigors. Secondary to the severity of his symptoms, the patient's wife finally convinced him to seek medical evaluation on the morning of admission. His wife notes that the patient's weakness was so severe that he required help with ambulation just to the hospital. The patient also confirmed dysuria, frequent sweating episodes, and the patient's wife does indicate that he looks more jaundiced than his baseline. He had similar episode of nausea and rigors 1 month prior to admission, although he did not seek medical care at that time. The patient otherwise otherwise  denied pain with defecation, fevers, abdominal pain, urinary retention, sore throat, cough, nasal congestion, confusion, headaches, change in vision. Weakness occurs throughout his body, and is not manifested as a focal unilateral weakness.  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 He also notes long-standing prostate issues, for which he is followed by a urologist, Dr. Clarene Duke. He frequently calls his urologist, if he has increased urinary frequency, at which time he is prescribed antibiotics.    Current Outpatient Medications: 1) Finasteride 5 mg po, 1 tab, qam  2) Androgel, Topical, 4 Pumps, daily  3) Omega-3 Fatty Acid 1 GM Cap po, daily 4) Metoprolol Tartrate 25 MG po, 0.5 tab, bid  5) Pravastatin 20 MG, po, qam  6) Multivitamin po, daily  7) Nitroglycerin SL 0.4 MG, [id number] 8) Aspirin 81 MG, po, daily  9) Viagra 25 MG, po, daily, PRN  10) Nexium 40 MG, po  qam  Allergies: No Known Allergies   Past Medical History: 1. Coronary artery disease - status-post percutaneous transluminal coronary angioplasty and stent deployment to the left anterior descending, reducing 80% stenosis to 0 and residual nonobstructive disease. (07/2008) 2. Paroxysmal atrial fibrillation - not on coumadin therapy, rate controlled with BB  3. H/o Schatzki ring - noted on EGD (04/2008) - s/p dilation  4. H/o Erosive antral gastritis and bulbar duodenitis - noted on EGD (  04/2008) 5. Testosterone deficiency - on testosterone replacement therapy (off last 1 month 2/2 cost) 6. Moderate OSA - per sleep study (2006) -  Not currently on CPAP, although recommended per sleep study. 7. Malignant melanoma in situ - s/p excision (08/2004) 2.5 cm lesion, right buttock, with resulting 4 cm right closure.  8. Colonic polyps - last colonoscopy (04/2005) - sessile polyp at cecum, 2nd polyp  at transverse colon 9. Sigmoid diverticulosis (per colonoscopy 04/2005)  10. Thyroid goiter - right greater than left multinodular goiter per thyroid US (08/2003)    Past Surgical History: 1. Cholecystectomy - with complications including bile duct and hepatic artery laceration, requiring repair at [facility] (1993) 2. Cardiac stenting (07/2008)  3. Lymph node biopsy  Family History: 1. Mother - " hole in heart" 2. Sister - colon cancer 3. One daughter, one son - no known health problems  Social History: History   Social History  . Marital Status: Married    Spouse Name: Lucendia Herrlich    Number of Children: 2    Social History Main Topics  . Smoking status: Former smoker, quit in 2009. Previously smoked one pack per day for 53 years  . Smokeless tobacco: None   . Alcohol Use: None   . Drug Use: None    Social History Narrative  . Works on and owns a farm    Review of Systems: Constitutional:  Confirms chills, diaphoresis, decreased appetite, fatigue. Denies documented fevers.   HEENT: Denies photophobia, eye pain, redness, hearing loss, ear pain, congestion, sore throat, rhinorrhea, sneezing, mouth sores, neck stiffness and tinnitus. Confirms long-standing difficulty   Respiratory: Denies SOB, DOE, cough, chest tightness, and wheezing.  Cardiovascular: Denies chest pain, palpitations and leg swelling.  Gastrointestinal: Confirms nausea, vomiting. Denies abdominal pain, diarrhea, constipation, blood in stool and abdominal distention.  Genitourinary: Confirms dysuria. Denies urgency, frequency, hematuria, flank pain and difficulty urinating.  Musculoskeletal: Denies myalgias, back pain, joint swelling, arthralgias and gait problem.   Skin: Denies pallor, rash and wound.  Neurological: Denies dizziness, seizures, syncope, light-headedness, numbness and headaches.   Hematological: Confirms easy bruising. Denies adenopathy, personal or family bleeding history.  Psychiatric/ Behavioral:  Denies suicidal ideation, mood changes, confusion, nervousness, sleep disturbance and agitation.    Vital Signs: T: 103 --> 100.4 P: 102 --> 65 BP: 102/48 RR: 18 O2 sat: 96% on RA   Physical Exam: General: Vital signs reviewed and noted. Well-developed, well-nourished, in no acute distress; alert, appropriate and cooperative throughout examination. Clammy in appearance.  Head: Normocephalic, atraumatic.  Eyes: PERRL, EOMI, No signs of anemia. Mild scleral icterus.   Nose: Mucous membranes moist, not inflammed, nonerythematous.  Throat: Oropharynx nonerythematous, no exudate appreciated, mildly dry mucous membranes.  Neck: No deformities, masses, or tenderness noted.Supple, No carotid Bruits, no JVD.  Lungs:  Normal respiratory effort. Clear to auscultation BL without crackles or wheezes.  Heart: RRR. S1 and S2 normal without gallop, murmur, or rubs.  Abdomen:  BS normoactive. Soft, Nondistended, non-tender.  No masses or organomegaly.  Extremities: No pretibial edema. No CVA tenderness.  Neurologic: A&O X3, CN II - XII are grossly intact. Motor strength is 5/5 in the all 4 extremities, Sensations intact to light touch. HTS and FTN intact. Gait normal and Romberg negative.  Skin: No visible rashes. Cholecystectomy scar noted on RUQ of abdomen.  Genital: No tenderness to palpation of prostate on rectal exam. No scrotal or penile lesions, erythema, warmth, rashes.   Lab results: Basic Metabolic Panel: Recent Labs  Basename 03/25/11 1030   NA 139   K 3.5   CL 105   CO2 24   GLUCOSE 126*   BUN 26*   CREATININE 1.09   CALCIUM 8.7   MG --   PHOS --   Liver Function Tests: Recent Labs  Basename 03/25/11 1030   AST 33   ALT 39   ALKPHOS 77   BILITOT 2.1*   PROT 5.8*   ALBUMIN 2.6*   Recent Labs  [facility] 03/25/11 1030   LIPASE 15   AMYLASE --   CBC: Recent Labs  Basename 03/25/11 1030   WBC 12.2*   NEUTROABS 11.3*   HGB 12.6*   HCT 36.7*   MCV 86.8   PLT 103*    Urinalysis:  Ref. Range 03/25/2011 10:26  Color, Urine Latest Range: YELLOW  ORANGE (A)  Appearance Latest Range: CLEAR  CLOUDY (A)  Specific Gravity, Urine Latest Range: 1.005-1.030  1.030  pH Latest Range: 5.0-8.0  5.5  Glucose, UA Latest Range: NEGATIVE mg/dL NEGATIVE  Bilirubin Urine Latest Range: NEGATIVE  MODERATE (A)  Ketones, ur Latest Range: NEGATIVE mg/dL 40 (A)  Protein Latest Range: NEGATIVE mg/dL 161 (A)  Urobilinogen, UA Latest Range: 0.0-1.0 mg/dL 1.0  Nitrite Latest Range: NEGATIVE  POSITIVE (A)  Leukocytes, UA Latest Range: NEGATIVE  SMALL (A)  Urine-Other No range found MUCOUS PRESENT  WBC, UA Latest Range: <3 WBC/hpf 7-10  Squamous Epithelial / LPF Latest Range: RARE  RARE  Casts Latest Range: NEGATIVE  HYALINE CASTS (A)    Imaging results:  1) Dg Chest 2 View - No acute cardiopulmonary abnormality.    Assessment & Plan: Pt is a 70 yo male with PMHx of CAD, BPH, Thyroid goiter who presented for evaluation of two-week history of weakness and one week history of nausea and vomiting. Pt also confirmed dysuria x 1 week with UA in the ER showing results positive for nitrites, leukocytes, and 7-10 WBC.   1) Sepsis, likely secondary to UTI - Pt meets sepsis criteria as he is hypotensive, tachycardic, febrile and with urinary source of infection, as evidenced on urinalysis. As this urinary tract infection is occuring in a male patient, it can be considered complicated in nature. There is concern for pyelonephritis despite lack of CVA tenderness, given the patient's associated nausea, vomiting, fevers, chills. CXR is otherwise negative for acute sources of pulmonary infection, therefore, we must further evaluate for a upper urinary tract infection as we would expect extended course of antibiotics if the pt is found to have pyelonephritis. Otherwise, CXR is negative for pulmonary source of infection. As well, no clear evidence of gastrointestinal infectious etiology, as pt  remains without diarrhea. Other differential diagnoses for this patient's sepsis include zoonotic illness given that the pt works on a farm with horses and cattle. However, these are considered less likely because equine sources would largely result in GI illness (due to campylobacter or shigella, etc) or respiratory infection (not indicated per CXR or pt symptomatology). Also, bovine sources of infection such as parapoxvirus infections would be expected to cause rash, which the pt has not experienced.   Plan:  Will admit to step-down unit  Will start very broad spectrum antibiotics initially, vancomycin and zosyn, with expectation to narrow antibiotics tomorrow after source of infection further clarified.  Blood cultures x 2  Urine culture  Will obtain a CT abdomen/ pelvis to evaluate for pyelonephritis, and potential other sources of infection  Initial aggressive IVF resuscitation  2) Nausea/  Vomiting - likely secondary to acute sepsis due to UTI with possible pyelonephritis (CT pending). Otherwise, acute pancreatitis unlikely given normal lipase. Acute viral or bacterial gastroenteritis unlikely given lack of concomitant diarrhea, and intermittent nature of symptoms  Plan:   Supportive care with antiemetics  IVF resuscitation  3) Weakness - Likely secondary to #1 and 2, although may be multifactorial contributed by his testosterone deficiency, that has been untreated for the last 1 month secondary to financial limitations. He also has untreated OSA (with CPAP recommended since 2006). Thyroid disease is also a consideration given history of thyroid goiter, of which the patient has not had regular follow-up. Lastly, adrenal insufficiency can be considered in setting of hypotension, nausea/ vomiting, multiple endocrinopathies.  Plan:   Treat acute illness, per #1  Check TFTs  Check AM cortisol levels  Request records from PCP and urologist regarding testosterone work-up.   4)  Coronary artery disease - status-post PCI to LAD in 07/2008. Cardiac enzymes negative x 1.   Hold initial beta blocker initially in setting of hypotension.     5) DVT PPX - Lovenox     Margorie John, M.D. (PGY1):    ____________________________________    Date/ Time:      ____________________________________     Johnette Abraham, D.O. (PGY2):  ____________________________________    Date/ Time:      ____________________________________      I have seen and examined the patient. I reviewed the resident/fellow note and agree with the findings and plan of care as documented. My additions and revisions are included.    Signature:  ____________________________________________     Internal Medicine Teaching Service Attending    Date:    ____________________________________________

## 2011-03-26 LAB — CBC
HCT: 35.3 % — ABNORMAL LOW (ref 39.0–52.0)
MCV: 87.6 fL (ref 78.0–100.0)
RBC: 4.03 MIL/uL — ABNORMAL LOW (ref 4.22–5.81)
RDW: 13.3 % (ref 11.5–15.5)
WBC: 8.3 10*3/uL (ref 4.0–10.5)

## 2011-03-26 LAB — URINE CULTURE: Culture: NO GROWTH

## 2011-03-26 LAB — BASIC METABOLIC PANEL
BUN: 21 mg/dL (ref 6–23)
CO2: 24 mEq/L (ref 19–32)
Chloride: 103 mEq/L (ref 96–112)
Creatinine, Ser: 0.96 mg/dL (ref 0.50–1.35)

## 2011-03-27 ENCOUNTER — Inpatient Hospital Stay (HOSPITAL_COMMUNITY): Payer: Medicare Other

## 2011-03-27 ENCOUNTER — Encounter (HOSPITAL_COMMUNITY): Payer: Self-pay | Admitting: Radiology

## 2011-03-27 DIAGNOSIS — K769 Liver disease, unspecified: Secondary | ICD-10-CM

## 2011-03-27 DIAGNOSIS — R112 Nausea with vomiting, unspecified: Secondary | ICD-10-CM

## 2011-03-27 DIAGNOSIS — C61 Malignant neoplasm of prostate: Secondary | ICD-10-CM

## 2011-03-27 DIAGNOSIS — K750 Abscess of liver: Secondary | ICD-10-CM | POA: Insufficient documentation

## 2011-03-27 DIAGNOSIS — R5381 Other malaise: Secondary | ICD-10-CM

## 2011-03-27 DIAGNOSIS — R5383 Other fatigue: Secondary | ICD-10-CM

## 2011-03-27 LAB — BASIC METABOLIC PANEL
BUN: 15 mg/dL (ref 6–23)
GFR calc Af Amer: >90 mL/min (ref >90)
GFR calc non Af Amer: 80 mL/min — ABNORMAL LOW (ref >90)
Potassium: 3.7 mEq/L (ref 3.5–5.1)
Sodium: 137 mEq/L (ref 135–145)

## 2011-03-27 LAB — AFP TUMOR MARKER: AFP-Tumor Marker: 1.6 ng/mL (ref 0.0–8.0)

## 2011-03-27 LAB — PSA: PSA: 5.44 ng/mL — ABNORMAL HIGH (ref <=4.00)

## 2011-03-27 LAB — GRAM STAIN

## 2011-03-28 DIAGNOSIS — K750 Abscess of liver: Secondary | ICD-10-CM

## 2011-03-28 DIAGNOSIS — R7881 Bacteremia: Secondary | ICD-10-CM

## 2011-03-28 LAB — GLUCOSE, CAPILLARY: Glucose-Capillary: 120 mg/dL — ABNORMAL HIGH (ref 70–99)

## 2011-03-28 LAB — CBC
HCT: 37.6 % — ABNORMAL LOW (ref 39.0–52.0)
MCH: 28.9 pg (ref 26.0–34.0)
MCHC: 33.2 g/dL (ref 30.0–36.0)
RDW: 12.7 % (ref 11.5–15.5)

## 2011-03-29 DIAGNOSIS — K750 Abscess of liver: Secondary | ICD-10-CM

## 2011-03-29 DIAGNOSIS — R7881 Bacteremia: Secondary | ICD-10-CM

## 2011-03-29 LAB — CULTURE, BLOOD (ROUTINE X 2)

## 2011-03-30 ENCOUNTER — Telehealth: Payer: Self-pay | Admitting: Oncology

## 2011-03-30 ENCOUNTER — Encounter: Payer: Medicare Other | Admitting: Oncology

## 2011-03-30 ENCOUNTER — Encounter: Payer: Self-pay | Admitting: Oncology

## 2011-03-30 ENCOUNTER — Telehealth: Payer: Self-pay | Admitting: *Deleted

## 2011-03-30 NOTE — Telephone Encounter (Signed)
Gennie, a nurse with [facility] called asking if pt can shower & reporting drsg is still dry & clean. Is she to change it daily? Her # is [phone]

## 2011-03-30 NOTE — Telephone Encounter (Signed)
I spoke with Alexander Burton. Mr. Radloff has any gauze dressing over his hepatic drain site. I instructed her to have it changed it to become soiled and after he takes a shower. He is feeling better and will come in to see me on November 6.

## 2011-03-30 NOTE — Telephone Encounter (Signed)
S/w pt re appt for 11/7 @ 1 pm w/FS

## 2011-03-31 LAB — CULTURE, ROUTINE-ABSCESS

## 2011-03-31 LAB — CULTURE, BLOOD (ROUTINE X 2): Culture: NO GROWTH

## 2011-04-02 ENCOUNTER — Encounter: Payer: Self-pay | Admitting: *Deleted

## 2011-04-02 ENCOUNTER — Telehealth: Payer: Self-pay | Admitting: Emergency Medicine

## 2011-04-02 DIAGNOSIS — I10 Essential (primary) hypertension: Secondary | ICD-10-CM | POA: Insufficient documentation

## 2011-04-02 NOTE — Telephone Encounter (Signed)
EXPLAINED TO PT WIFE THAT WE NEED AN ORDER FROM DR CAMPBELL AND I WOULD CALL HER BACK TO SCHEDULE FOR NEXT WEEK

## 2011-04-03 ENCOUNTER — Encounter: Payer: Self-pay | Admitting: Internal Medicine

## 2011-04-03 ENCOUNTER — Ambulatory Visit (INDEPENDENT_AMBULATORY_CARE_PROVIDER_SITE_OTHER): Payer: Medicare Other | Admitting: Internal Medicine

## 2011-04-03 ENCOUNTER — Encounter: Payer: Self-pay | Admitting: *Deleted

## 2011-04-03 VITALS — BP 105/70 | HR 75 | Temp 97.7°F | Ht 70.0 in | Wt 198.5 lb

## 2011-04-03 DIAGNOSIS — I519 Heart disease, unspecified: Secondary | ICD-10-CM

## 2011-04-03 DIAGNOSIS — I2510 Atherosclerotic heart disease of native coronary artery without angina pectoris: Secondary | ICD-10-CM | POA: Insufficient documentation

## 2011-04-03 DIAGNOSIS — Z23 Encounter for immunization: Secondary | ICD-10-CM

## 2011-04-03 DIAGNOSIS — G4730 Sleep apnea, unspecified: Secondary | ICD-10-CM | POA: Insufficient documentation

## 2011-04-03 DIAGNOSIS — K750 Abscess of liver: Secondary | ICD-10-CM

## 2011-04-03 DIAGNOSIS — Q782 Osteopetrosis: Secondary | ICD-10-CM | POA: Insufficient documentation

## 2011-04-03 DIAGNOSIS — N400 Enlarged prostate without lower urinary tract symptoms: Secondary | ICD-10-CM

## 2011-04-03 NOTE — Progress Notes (Signed)
  Subjective:    Patient ID: Alexander Burton, male    DOB: 15-Dec-1940, 70 y.o.   MRN: [id number]  HPI Alexander Burton is in for his hospital followup visit. He was hospitalized recently with several months of fatigue and chills and was found to have a 4 x 4.2 cm abscess in the left lobe of his liver complicated by a microaerophilic streptococcal bacteremia. An abscess drain was placed and cultures grew microaerophilic strep and Escherichia coli. He was discharged home on oral levofloxacin and metronidazole. He is now on day 10 of antibiotic therapy and day 8 status post drain placement.  He is feeling better with an improved appetite. He has not had any further rigors. He has noted some mild transient itching after he takes his levofloxacin each morning. It is usually most noticeable on his hands and generally resolves within about 10 minutes. He has not noted any rash. He does not find the itching to be particularly bothersome.  His wife is flushing his drain with 10 cc of saline every morning. He is now only having about 3 cc of drain output daily.    Review of Systems     Objective:   Physical Exam  Constitutional: He appears well-developed and well-nourished. No distress.       He looks better than when he was in the hospital. He is smiling.  HENT:  Mouth/Throat: Oropharynx is clear and moist. No oropharyngeal exudate.  Cardiovascular: Normal rate, regular rhythm and normal heart sounds.   No murmur heard. Pulmonary/Chest: Breath sounds normal. He has no rales.  Abdominal: Soft. Bowel sounds are normal. He exhibits no distension. There is no tenderness.       The right upper quadrant drain site looks good. There about 3-4 cc of thin, serosanguineous fluid in the JP drain bulb. He has a large, healed right upper quadrant incision from previous open cholecystectomy.  Skin: No rash noted.  Psychiatric: He has a normal mood and affect.          Assessment & Plan:

## 2011-04-03 NOTE — Assessment & Plan Note (Signed)
[name] is improving on antibiotic therapy for a polymicrobial liver abscess. His drain output has decreased significantly so I will repeat a limited CT scan to see if the drain can be removed. He feels like he can tolerate the transient itching so I will continue his current antibiotic regimen and see him back next week after the CT scan is completed.  He is [facility] Alexander [facility]. While hospitalized he was noted to have an enlarged prostate on the CT scan and a PSA of 5.44. The scan also showed some sclerotic bone lesions and T11, L3, and L4 raising the possibility of prostate cancer with bony metastases. He also has a followup appointment with Alexander Burton, his urologist, at [facility].

## 2011-04-03 NOTE — Progress Notes (Signed)
Addended by: Jennet Maduro D on: 04/03/2011 10:50 AM   Modules accepted: Orders

## 2011-04-04 ENCOUNTER — Other Ambulatory Visit: Payer: Self-pay | Admitting: Physician Assistant

## 2011-04-04 ENCOUNTER — Ambulatory Visit (HOSPITAL_COMMUNITY)
Admission: RE | Admit: 2011-04-04 | Discharge: 2011-04-04 | Disposition: A | Discharge status: Home / Self Care | Payer: Medicare Other | Source: Ambulatory Visit | Attending: Internal Medicine | Admitting: Internal Medicine

## 2011-04-04 ENCOUNTER — Telehealth: Payer: Self-pay | Admitting: Oncology

## 2011-04-04 ENCOUNTER — Ambulatory Visit: Payer: Medicare Other

## 2011-04-04 ENCOUNTER — Other Ambulatory Visit: Payer: Self-pay | Admitting: Oncology

## 2011-04-04 ENCOUNTER — Other Ambulatory Visit (HOSPITAL_BASED_OUTPATIENT_CLINIC_OR_DEPARTMENT_OTHER): Payer: Medicare Other

## 2011-04-04 ENCOUNTER — Ambulatory Visit (HOSPITAL_BASED_OUTPATIENT_CLINIC_OR_DEPARTMENT_OTHER): Payer: Medicare Other | Admitting: Oncology

## 2011-04-04 VITALS — BP 114/72 | HR 80 | Temp 97.4°F | Ht 70.0 in | Wt 199.6 lb

## 2011-04-04 DIAGNOSIS — N400 Enlarged prostate without lower urinary tract symptoms: Secondary | ICD-10-CM

## 2011-04-04 DIAGNOSIS — K750 Abscess of liver: Secondary | ICD-10-CM

## 2011-04-04 DIAGNOSIS — M899 Disorder of bone, unspecified: Secondary | ICD-10-CM

## 2011-04-04 DIAGNOSIS — J90 Pleural effusion, not elsewhere classified: Secondary | ICD-10-CM | POA: Insufficient documentation

## 2011-04-04 DIAGNOSIS — M949 Disorder of cartilage, unspecified: Secondary | ICD-10-CM

## 2011-04-04 LAB — CBC WITH DIFFERENTIAL/PLATELET
Basophils Absolute: 0 10*3/uL (ref 0.0–0.1)
EOS%: 0.6 % (ref 0.0–7.0)
Eosinophils Absolute: 0.1 10*3/uL (ref 0.0–0.5)
HGB: 13.2 g/dL (ref 13.0–17.1)
LYMPH%: 11.9 % — ABNORMAL LOW (ref 14.0–49.0)
MCH: 29.3 pg (ref 27.2–33.4)
MCV: 89.1 fL (ref 79.3–98.0)
MONO%: 5.2 % (ref 0.0–14.0)
NEUT#: 6.9 10*3/uL — ABNORMAL HIGH (ref 1.5–6.5)
NEUT%: 82 % — ABNORMAL HIGH (ref 39.0–75.0)
Platelets: 361 10*3/uL (ref 140–400)
RDW: 14.1 % (ref 11.0–14.6)

## 2011-04-04 LAB — COMPREHENSIVE METABOLIC PANEL
AST: 23 U/L (ref 0–37)
Albumin: 3 g/dL — ABNORMAL LOW (ref 3.5–5.2)
Alkaline Phosphatase: 78 U/L (ref 39–117)
BUN: 15 mg/dL (ref 6–23)
Creatinine, Ser: 0.98 mg/dL (ref 0.50–1.35)
Glucose, Bld: 117 mg/dL — ABNORMAL HIGH (ref 70–99)
Total Bilirubin: 0.5 mg/dL (ref 0.3–1.2)

## 2011-04-04 MED ORDER — SODIUM CHLORIDE 0.9 % IV SOLN: INTRAVENOUS | Status: DC

## 2011-04-04 MED ORDER — IOHEXOL 300 MG/ML  SOLN
80.0000 mL | Freq: Once | INTRAMUSCULAR | Status: AC | PRN
  Administered 2011-04-04: 80 mL via INTRAVENOUS

## 2011-04-04 NOTE — Telephone Encounter (Signed)
gve the pt his bone scan appt for nov.

## 2011-04-04 NOTE — Progress Notes (Signed)
Hematology and Oncology Follow Up Visit  Alexander Burton [id number] [birth year] 70 y.o. 04/04/2011 2:08 PM   Principle Diagnosis: 33 -year-old with a history of prostatic enlargement and possible sclerotic bony lesions rule out metastatic malignancy.   Interim History:  70 year old gentleman who I saw as an inpatient consult during his recent hospitalization in the end of October of 2012. At that time he was found to have a hepatic abscess and underwent CT-guided drainage of abscess as well as required intravenous antibiotics. During that hospitalization he underwent a CT scan of the abdomen and pelvis which showed a sclerotic bony lesions both in the ribs as well as the spine. Patient has a known history of benign prostatic as well as elevated PSA in the past however he does not have a history of prostate cancer. Patient had been followed by Dr. Gaynelle Burton and have had a mildly elevated PSA for years. He had had a prosthetic biopsy in the past that have been benign. During this hospitalization his PSA was checked and was mildly elevated at 5.44. Patient was discharged from the hospital to followup with me today. Patient clinically is feeling well is not reporting any abdominal pain is not reporting any fevers or chills is not reporting any increase in his lower back pain he does not have any neurological symptoms does not have any paresthesias or anesthesia is. He did not have any change in his bowel habits overall he is regaining most activities of daily living.    Past Medical History:  1. Coronary artery disease - status-post percutaneous transluminal coronary angioplasty and stent deployment to the left anterior descending, reducing 80% stenosis to 0 and residual nonobstructive disease. (07/2008) 2. Paroxysmal atrial fibrillation - not on coumadin therapy, rate controlled with BB  3. H/o Schatzki ring - noted on EGD (04/2008) - s/p dilation  4. H/o Erosive antral gastritis and bulbar duodenitis  - noted on EGD (04/2008) 5. Testosterone deficiency - on testosterone replacement therapy (off last 1 month 2/2 cost) 6. Moderate OSA - per sleep study (2006) - Not currently on CPAP, although recommended per sleep study. 7. Malignant melanoma in situ - s/p excision (08/2004) 2.5 cm lesion, right buttock, with resulting 4 cm right closure.  8. Colonic polyps - last colonoscopy (04/2005) - sessile polyp at cecum, 2nd polyp at transverse colon 9. Sigmoid diverticulosis (per colonoscopy 04/2005)  10. Thyroid goiter - right greater than left multinodular goiter per thyroid US (08/2003)  Past Surgical History:  1. Cholecystectomy - with complications including bile duct and hepatic artery laceration, requiring repair at [facility] (1993)  2. Cardiac stenting (07/2008)  3. Lymph node biopsy    Medications: I have reviewed the patient's current medications.      Allergies: No Known Allergies  .  Review of Systems: Constitutional:  Negative for fever, chills, night sweats, anorexia, weight loss, pain. Cardiovascular: no chest pain or dyspnea on exertion Respiratory: no cough, shortness of breath, or wheezing Neurological: negative Dermatological: negative ENT: negative Skin Gastrointestinal: no abdominal pain, change in bowel habits, or black or bloody stools Genito-Urinary: no dysuria, trouble voiding, or hematuria Hematological and Lymphatic: negative Breast: negative Musculoskeletal: negative Remaining ROS negative.  Physical Exam: Blood pressure 114/72, pulse 80, temperature 97.4 F (36.3 C), height 5\' 10"  (1.778 m), weight 199 lb 9.6 oz (90.538 kg). ECOG:  General appearance: alert and no distress Head: Normocephalic, without obvious abnormality, atraumatic Neck: no adenopathy, no carotid bruit, no JVD, supple, symmetrical, trachea midline and thyroid  not enlarged, symmetric, no tenderness/mass/nodules Lymph nodes: Cervical, supraclavicular, and axillary nodes  normal. Heart:regular rate and rhythm, no murmurs or gallops Lung:chest clear, no wheezing, rales, normal symmetric air entry, Heart exam - S1, S2 normal, no murmur, no gallop, rate regular Abdomin:normal EXT:no erythema, induration, or nodules Lab Results: Lab Results  Component Value Date   WBC 8.4 03/28/2011   HGB 13.2 04/04/2011   HCT 40.1 04/04/2011   MCV 89.1 04/04/2011   PLT 361 04/04/2011     Chemistry      Component Value Date/Time   NA 137 03/27/2011 0402   K 3.7 03/27/2011 0402   CL 105 03/27/2011 0402   CO2 25 03/27/2011 0402   BUN 15 03/27/2011 0402   CREATININE 0.98 03/27/2011 0402      Component Value Date/Time   CALCIUM 7.9* 03/27/2011 0402   ALKPHOS 73 03/25/2011 1702   AST 37 03/25/2011 1702   ALT 41 03/25/2011 1702   BILITOT 1.9* 03/25/2011 1702      CT scan 04/04/2011.  IMPRESSION:  1. Prostatic enlargement with multiple focal sclerotic bone  lesions. Evaluation for metastatic prostate cancer is recommended.  2. Indeterminate hypo enhancing lesions in the liver are worrisome  for metastasis.  3. Right inguinal hernia containing fat.     Impression and Plan: 70 year old gentleman with the following issues:  1. Questionable sclerotic bony lesions in the spine that could be related to malignancy versus benign bone findings. I have personally reviewed these imaging studies with radiology and as mentioned the differential diagnosis includes possible benign causes however metastatic malignancy could not be completely ruled out. The study of choice recommended by radiology as a bone scan which we will stop arrange for it in the near future. If his bone scan is negative then these bone lesions arm also likely benign in nature and no further workup is needed. However, if these findings are indeed positive on the bone scan then further workup will be needed and I will be in the form of a biopsy of these bony lesions versus another imaging study such as fluorinated  PET scan. After the results of the of the bone scan are in I will communicate the findings to [name] at that time. 2. Hepatic abscess he has a drain in place and followed by infectious disease Dr. Orvan Falconer and intervention radiology.      Spent more than half the time coordinating care.    [facility], MD 11/7/20122:08 PM

## 2011-04-05 ENCOUNTER — Ambulatory Visit (HOSPITAL_COMMUNITY)
Admission: RE | Admit: 2011-04-05 | Discharge: 2011-04-05 | Disposition: A | Discharge status: Home / Self Care | Payer: Medicare Other | Source: Ambulatory Visit | Attending: Physician Assistant | Admitting: Physician Assistant

## 2011-04-05 DIAGNOSIS — K750 Abscess of liver: Secondary | ICD-10-CM | POA: Insufficient documentation

## 2011-04-05 LAB — PROTIME-INR
INR: 1.24 (ref 0.00–1.49)
Prothrombin Time: 15.9 s — ABNORMAL HIGH (ref 11.6–15.2)

## 2011-04-05 LAB — CBC
HCT: 43.8 % (ref 39.0–52.0)
Hemoglobin: 14.6 g/dL (ref 13.0–17.0)
MCH: 29.6 pg (ref 26.0–34.0)
MCHC: 33.3 g/dL (ref 30.0–36.0)
RBC: 4.93 MIL/uL (ref 4.22–5.81)

## 2011-04-05 MED ORDER — FENTANYL CITRATE 0.05 MG/ML IJ SOLN
INTRAMUSCULAR | Status: AC | PRN
  Administered 2011-04-05 (×2): 25 ug via INTRAVENOUS
  Administered 2011-04-05: 50 ug via INTRAVENOUS

## 2011-04-05 MED ORDER — FENTANYL CITRATE 0.05 MG/ML IJ SOLN
INTRAMUSCULAR | Status: AC
  Filled 2011-04-05: qty 2

## 2011-04-05 MED ORDER — MIDAZOLAM HCL 2 MG/2ML IJ SOLN
INTRAMUSCULAR | Status: AC
  Filled 2011-04-05: qty 2

## 2011-04-05 MED ORDER — MIDAZOLAM HCL 5 MG/5ML IJ SOLN
INTRAMUSCULAR | Status: AC | PRN
  Administered 2011-04-05 (×2): 1 mg via INTRAVENOUS

## 2011-04-05 NOTE — Procedures (Signed)
Procedure:  CT guided hepatic abscess catheter manipulation and aspiration Findings:  Old catheter wired and manipulated.  New puncture of liver and advancement of drain.  Aspiration performed.  Sample sent for culture Plan:  2 hour recovery

## 2011-04-05 NOTE — H&P (Signed)
Alexander Burton is an 70 y.o. male.   Chief Complaint: Peri hepatic abscess / fluid collection.  HPI: 70 yo male with previous drain placement. Recent CT reveals new fluid collection. Reviewed by Dr Fredia Sorrow - may need to reposition drain or place new drain.  Past Medical History  Diagnosis Date  . Atrial fibrillation   . CAD (coronary artery disease)   . Prostate ca   . BPH (benign prostatic hyperplasia)   . Gastritis   . Osteoarthritis   . Melanoma     sigmoid diverticulosis  . Goiter     Past Surgical History  Procedure Date  . Cholecystectomy   . Surgical resection, melanoma     Family History  Problem Relation Age of Onset  . Cancer Sister     colon   Social History:  reports that he quit smoking about 3 years ago. His smoking use included Cigarettes. He quit after 50 years of use. He has never used smokeless tobacco. He reports that he drinks about .5 ounces of alcohol per week. He reports that he does not use illicit drugs.  Allergies: No Known Allergies  Medications Prior to Admission  Medication Sig Dispense Refill  . aspirin 81 MG tablet Take 81 mg by mouth daily.        Marland Kitchen esomeprazole (NEXIUM) 40 MG capsule Take 40 mg by mouth daily before breakfast.        . finasteride (PROSCAR) 5 MG tablet Take 5 mg by mouth daily.        . fish oil-omega-3 fatty acids 1000 MG capsule Take 2 g by mouth daily.        Marland Kitchen levofloxacin (LEVAQUIN) 750 MG tablet Take 750 mg by mouth daily.        . metoprolol succinate (TOPROL-XL) 25 MG 24 hr tablet Take 25 mg by mouth daily.        . metroNIDAZOLE (FLAGYL) 500 MG tablet Take 500 mg by mouth 3 (three) times daily.        . Multiple Vitamin (MULTIVITAMIN) tablet Take 1 tablet by mouth daily.        . pravastatin (PRAVACHOL) 20 MG tablet Take 20 mg by mouth daily.        Marland Kitchen testosterone (ANDROGEL) 50 MG/5GM GEL Place 10 g onto the skin daily.         Medications Prior to Admission  Medication Dose Route Frequency Provider Last Rate  Last Dose  . iohexol (OMNIPAQUE) 300 MG/ML injection 80 mL  80 mL Intravenous Once PRN Medication Radiologist   80 mL at 04/04/11 0939  . DISCONTD: 0.9 %  sodium chloride infusion   Intravenous Continuous Anselm Pancoast, PA        Results for orders placed in visit on 04/04/11 (from the past 48 hour(s))  COMPREHENSIVE METABOLIC PANEL     Status: Abnormal (Preliminary result)   Collection Time   04/04/11  1:20 PM      Component Value Range Comment   Sodium 139  135 - 145 (mEq/L)    Potassium 4.3  3.5 - 5.3 (mEq/L)    Chloride 105  96 - 112 (mEq/L)    CO2 26  19 - 32 (mEq/L)    Glucose, Bld 117 (*) 70 - 99 (mg/dL)    BUN 15  6 - 23 (mg/dL)    Creatinine, Ser 1.61  0.50 - 1.35 (mg/dL)    Total Bilirubin 0.5  0.3 - 1.2 (mg/dL)    Alkaline  Phosphatase 78  39 - 117 (U/L)    AST 23  0 - 37 (U/L)    ALT 27  0 - 53 (U/L)    Total Protein 5.6 (*) 6.0 - 8.3 (g/dL)    Albumin 3.0 (*) 3.5 - 5.2 (g/dL)    Calcium 8.2 (*) 8.4 - 10.5 (mg/dL)   COMPREHENSIVE METABOLIC PANEL     Status: Abnormal   Collection Time   04/04/11  1:20 PM      Component Value Range Comment   Sodium 139  135 - 145 (mEq/L)    Potassium 4.3  3.5 - 5.3 (mEq/L)    Chloride 105  96 - 112 (mEq/L)    CO2 26  19 - 32 (mEq/L)    Glucose, Bld 117 (*) 70 - 99 (mg/dL)    BUN 15  6 - 23 (mg/dL)    Creatinine, Ser 1.61  0.50 - 1.35 (mg/dL)    Total Bilirubin 0.5  0.3 - 1.2 (mg/dL)    Alkaline Phosphatase 78  39 - 117 (U/L)    AST 23  0 - 37 (U/L)    ALT 27  0 - 53 (U/L)    Total Protein 5.6 (*) 6.0 - 8.3 (g/dL)    Albumin 3.0 (*) 3.5 - 5.2 (g/dL)    Calcium 8.2 (*) 8.4 - 10.5 (mg/dL)    Ct Abdomen W Contrast  04/04/2011  *RADIOLOGY REPORT*  Clinical Data:  Evaluate liver abscess, possibly remove JP drain  CT ABDOMEN WITH CONTRAST  Technique:  Multidetector CT imaging of the abdomen was performed using the standard protocol following bolus administration of intravenous contrast.  Contrast: 80mL OMNIPAQUE IOHEXOL 300 MG/ML IV  SOLN  Comparison:  CT abdomen pelvis - 03/25/2011; CT guided drain placement - 03/27/2011  Findings:  Interval decrease in size of hypoattenuating abscess within the anterior segment of the right lobe of the liver, however, there is now more well defined approximately 3.6 x 1.6 cm hypoattenuating, peripherally enhancing collection inferior and lateral to the coil of the abscess drainage catheter.  It is unclear on this examination whether the drainage catheter communicates with this now more well defined abscess.  There is a minimal amount of air about the catheter.  No new hepatic lesions.  Normal hepatic contour.  No peri hepatic fluid/ascites.  Post cholecystectomy. There is unchanged mild intrahepatic biliary ductal dilatation within the left lobe of the liver.  There is symmetric enhancement and excretion of the bilateral kidneys.  No urinary obstruction.  Unchanged appearance of left- sided partially exophytic renal cyst.  The bilateral adrenal glands are normal.  Normal pancreas and spleen.  Postsurgical change of a loop of bowel within the right lower abdomen. The bowel is otherwise normal in course and caliber without wall thickening or evidence of obstruction.  There is minimal ectasia of the infrarenal abdominal aorta, measuring approximately 2.7 x 2.5 cm in greatest transverse axial dimension. No retroperitoneal or mesenteric adenopathy.  Limited visualization of the lower thorax demonstrates interval development of a small right-sided pleural effusion and right basilar heterogeneous opacities some of which demonstrate air bronchograms.  Normal heart size.  Coronary artery calcifications. No pericardial effusion.  Unchanged sclerotic lesions within the T8, T12 and L4 vertebral bodies, the left 9th and right 8th ribs as well as within the visualized superior aspect of the right iliac crest, worrisome for multifocal osseous metastatic disease.  IMPRESSION: 1.  Interval decrease in size of hypoattenuating  abscess within the anterior segment of  the right lobe of the liver however there is a residual now more well defined approximately 3.6 cm abscess which does not definitely communicate with the subjacent abscess drainage catheter.  2.  Interval development of small right-sided pleural effusion and right basilar airspace opacities, possibly atelectasis though underlying infection is not excluded.  3.  Unchanged multifocal sclerotic osseous lesions worrisome for metastatic prostate cancer.  Above findings discussed with Michael Litter, PA ( interventional radiology) at [phone].  Original Report Authenticated By: Waynard Reeds, M.D.    Review of Systems  Constitutional: Positive for fever and chills.  Respiratory: Negative for shortness of breath.   Cardiovascular: Negative for chest pain and palpitations.  Gastrointestinal: Negative for abdominal pain.  Endo/Heme/Allergies: Bruises/bleeds easily.    Blood pressure 119/82, pulse 80, temperature 96 F (35.6 C), temperature source Oral, resp. rate 22, height 5\' 9"  (1.753 m), weight 195 lb (88.451 kg), SpO2 96.00%. Physical Exam Airway -1  Heart - RRR Lungs - decreased but clear Abd - soft non tender  Assessment/Plan Hepatic drain repositioning vs new hepatic drain.  Deatrice Spanbauer 04/05/2011, 9:42 AM

## 2011-04-08 LAB — CULTURE, ROUTINE-ABSCESS: Culture: NO GROWTH

## 2011-04-10 ENCOUNTER — Other Ambulatory Visit: Payer: Self-pay | Admitting: Ophthalmology

## 2011-04-10 LAB — ANAEROBIC CULTURE

## 2011-04-12 ENCOUNTER — Encounter: Payer: Self-pay | Admitting: Internal Medicine

## 2011-04-12 ENCOUNTER — Ambulatory Visit (INDEPENDENT_AMBULATORY_CARE_PROVIDER_SITE_OTHER): Payer: Medicare Other | Admitting: Internal Medicine

## 2011-04-12 VITALS — BP 121/76 | HR 71 | Temp 98.1°F | Ht 71.0 in | Wt 194.0 lb

## 2011-04-12 DIAGNOSIS — K750 Abscess of liver: Secondary | ICD-10-CM

## 2011-04-12 MED ORDER — METRONIDAZOLE 500 MG PO TABS: 500.0000 mg | ORAL_TABLET | Freq: Three times a day (TID) | ORAL | Status: AC

## 2011-04-12 MED ORDER — LEVOFLOXACIN 750 MG PO TABS: 750.0000 mg | ORAL_TABLET | Freq: Every day | ORAL | Status: DC

## 2011-04-12 NOTE — Progress Notes (Signed)
  Subjective:    Patient ID: Alexander Burton, male    DOB: 07-18-1940, 70 y.o.   MRN: [id number]  HPI Alexander Burton is in for his routine visit. He is feeling much better. He is now completed 19 days of antibiotic therapy for his liver abscess. Last week interventional radiology tried to reposition his catheter to access the residual abscess but was unsuccessful so the catheter was removed. He has not had any problems tolerating his antibiotics. The mild transient itching on his palms has resolved. He has not had any further fever.    Review of Systems     Objective:   Physical Exam  Constitutional: He appears well-developed and well-nourished. No distress.  Abdominal: Soft. Bowel sounds are normal. He exhibits no distension. There is no tenderness.  Psychiatric: He has a normal mood and affect.          Assessment & Plan:

## 2011-04-12 NOTE — Assessment & Plan Note (Signed)
His liver abscess is improving. However I will continue his current antibiotics and plan a repeat CT scan in 2 weeks before deciding when to stop the antibiotics.

## 2011-04-18 ENCOUNTER — Encounter (HOSPITAL_COMMUNITY)
Admission: RE | Admit: 2011-04-18 | Discharge: 2011-04-18 | Disposition: A | Discharge status: Home / Self Care | Payer: Medicare Other | Source: Ambulatory Visit | Attending: Oncology | Admitting: Oncology

## 2011-04-18 DIAGNOSIS — N400 Enlarged prostate without lower urinary tract symptoms: Secondary | ICD-10-CM

## 2011-04-18 DIAGNOSIS — M899 Disorder of bone, unspecified: Secondary | ICD-10-CM | POA: Insufficient documentation

## 2011-04-18 DIAGNOSIS — M949 Disorder of cartilage, unspecified: Secondary | ICD-10-CM | POA: Insufficient documentation

## 2011-04-18 MED ORDER — TECHNETIUM TC 99M MEDRONATE IV KIT
25.0000 | PACK | Freq: Once | INTRAVENOUS | Status: AC | PRN
  Administered 2011-04-18: 25 via INTRAVENOUS

## 2011-04-25 ENCOUNTER — Other Ambulatory Visit: Payer: Self-pay | Admitting: Oncology

## 2011-04-25 ENCOUNTER — Encounter: Payer: Self-pay | Admitting: *Deleted

## 2011-04-25 DIAGNOSIS — K750 Abscess of liver: Secondary | ICD-10-CM

## 2011-04-25 DIAGNOSIS — N400 Enlarged prostate without lower urinary tract symptoms: Secondary | ICD-10-CM

## 2011-04-26 ENCOUNTER — Telehealth: Payer: Self-pay | Admitting: Oncology

## 2011-04-26 NOTE — Telephone Encounter (Signed)
S/w wife re appt for 3/28 @ 11:30 am.

## 2011-04-27 ENCOUNTER — Ambulatory Visit
Admission: RE | Admit: 2011-04-27 | Discharge: 2011-04-27 | Disposition: A | Discharge status: Home / Self Care | Payer: Medicare Other | Source: Ambulatory Visit | Attending: Internal Medicine | Admitting: Internal Medicine

## 2011-04-27 DIAGNOSIS — K750 Abscess of liver: Secondary | ICD-10-CM

## 2011-04-27 MED ORDER — IOHEXOL 300 MG/ML  SOLN
100.0000 mL | Freq: Once | INTRAMUSCULAR | Status: AC | PRN
  Administered 2011-04-27: 100 mL via INTRAVENOUS

## 2011-04-30 NOTE — Discharge Summary (Signed)
Internal Medicine Teaching [facility] Discharge Note  Name: Alexander Burton MRN: [id number] DOB: Oct 24, 1940 70 y.o.  Date of Admission: 03/25/2011 10:05 AM Date of Discharge: 03/29/2011 Attending Physician: Dr. Campbell/Dr. Ninetta Lights  Discharge Diagnosis: 1) Hepatic Abscess- found on CT and confirmed with liver biopsy which showed E coli and microaerophilic streptococci.  2) Sclerotic bone lesions- of unknown origin. Suspicious for prostatic cancer although PSA is not elevated so other causes are a possibility. Will have further workup with outpatient oncology. 3) CAD s/p percutaneous angioplasty- PCI to LAD in 07/2008 4) Prostatic enlargement- managed by outpatient urologist Dr. Earlene Plater with finasteride. 5)Sleep apnea  Discharge Medications: Levofloxacin 750mg  by mouth daily for 14 days Metronidazole 500mg  by mouth TID for 14 days Aleve 220mg , 2 tab by mouth daily Androgel 4 pumps topically daily ASA 81mg  daily Finasteride 5mg  every morning Fish oil OTC 1 tab daily Metoprolol tartrate 12.5 mg BID MV 1 tab daily Nexium 40mg  every morning NTG 0.4 mg SL 1 tab under tongue q76min PRN CP for 3 doses Pravachol 20mg  daily  Viagra 25mg  daily PRN erectile dysfunction  Disposition and follow-up:   Alexander Burton was discharged from [facility] in Stable condition.   Dr. Orvan Falconer- ID clinic- monitor patient's progress on oral antibiotic regimen. Dr. Clelia Croft- oncology- patient will need further work up of his sclerotic bone lesions for possible malignant cause. Dr. Earlene Plater- urology- for Prostate Ca work up/BPH follow up Interventional radiology- to follow up abscess to determine if it has drained properly if indicated based on clinical course on antibiotics. Dr. Blair Dolphin office will arrange for the follow up if necessary  Consultations:  Oncology, Interventional Radiology  Procedures Performed:  Ct Abdomen W Contrast  04/27/2011  *RADIOLOGY REPORT*  Clinical Data:   Liver abscess.  CT ABDOMEN WITH CONTRAST  Technique:  Multidetector CT imaging of the abdomen was performed using the standard protocol following bolus administration of intravenous contrast.  Contrast: OMNIPAQUE IOHEXOL 300 MG/ML IV SOLN  Comparison:  04/04/2011 and bone scan 04/18/2011.  Findings:  Lung bases show a small right pleural effusion, slightly increased in size.  There is air space disease in the adjacent right lower lobe.  A small nodular density in the right lower lobe may be infectious or inflammatory.  Minimal subpleural atelectasis in the left lower lobe.  Heart size normal.  No pericardial effusion.  Percutaneous drain has been removed from a complex abscess in the right hepatic lobe.  Currently, there is a mildly heterogeneous and intermediate density collection remaining, measuring 2.9 x 2.7 cm (previously 5.1 x 4.4 cm, when remeasured.  Locules of internal air are likely related to the drain.  There is new pneumobilia.  Postop changes of cholecystectomy.  Adrenal glands and right kidney are unremarkable. Low attenuation lesions in the kidneys measure up to 3.5 cm on the left, and are likely cysts.  Spleen, pancreas, and stomach are unremarkable. Postoperative changes are seen in the distal small bowel. Visualized portion of the small bowel and colon is otherwise unremarkable.  Atherosclerotic calcification of the arterial vasculature. Infrarenal aorta measures up to 2.7 cm.  Proximal left common iliac artery measures 1.7 cm.  No pathologically enlarged lymph nodes. No free fluid.  Scattered sclerotic lesions are again seen in the spine and ribs.  IMPRESSION:  1.  Improving right hepatic lobe abscess, with interval removal of the drainage catheter. 2.  New pneumobilia may relate to the previous indwelling drainage catheter. 3.  Slight enlargement of  a small right pleural effusion, with air space disease in the right lower lobe. 4.  Scattered sclerotic lesions in the spine and ribs,  indeterminate.  Please refer to examination performed 04/18/2011 for additional imaging evaluation.  Original Report Authenticated By: Reyes Ivan, M.D.   Ct Abdomen W Contrast  04/04/2011  *RADIOLOGY REPORT*  Clinical Data:  Evaluate liver abscess, possibly remove JP drain  CT ABDOMEN WITH CONTRAST  Technique:  Multidetector CT imaging of the abdomen was performed using the standard protocol following bolus administration of intravenous contrast.  Contrast: 80mL OMNIPAQUE IOHEXOL 300 MG/ML IV SOLN  Comparison:  CT abdomen pelvis - 03/25/2011; CT guided drain placement - 03/27/2011  Findings:  Interval decrease in size of hypoattenuating abscess within the anterior segment of the right lobe of the liver, however, there is now more well defined approximately 3.6 x 1.6 cm hypoattenuating, peripherally enhancing collection inferior and lateral to the coil of the abscess drainage catheter.  It is unclear on this examination whether the drainage catheter communicates with this now more well defined abscess.  There is a minimal amount of air about the catheter.  No new hepatic lesions.  Normal hepatic contour.  No peri hepatic fluid/ascites.  Post cholecystectomy. There is unchanged mild intrahepatic biliary ductal dilatation within the left lobe of the liver.  There is symmetric enhancement and excretion of the bilateral kidneys.  No urinary obstruction.  Unchanged appearance of left- sided partially exophytic renal cyst.  The bilateral adrenal glands are normal.  Normal pancreas and spleen.  Postsurgical change of a loop of bowel within the right lower abdomen. The bowel is otherwise normal in course and caliber without wall thickening or evidence of obstruction.  There is minimal ectasia of the infrarenal abdominal aorta, measuring approximately 2.7 x 2.5 cm in greatest transverse axial dimension. No retroperitoneal or mesenteric adenopathy.  Limited visualization of the lower thorax demonstrates interval  development of a small right-sided pleural effusion and right basilar heterogeneous opacities some of which demonstrate air bronchograms.  Normal heart size.  Coronary artery calcifications. No pericardial effusion.  Unchanged sclerotic lesions within the T8, T12 and L4 vertebral bodies, the left 9th and right 8th ribs as well as within the visualized superior aspect of the right iliac crest, worrisome for multifocal osseous metastatic disease.  IMPRESSION: 1.  Interval decrease in size of hypoattenuating abscess within the anterior segment of the right lobe of the liver however there is a residual now more well defined approximately 3.6 cm abscess which does not definitely communicate with the subjacent abscess drainage catheter.  2.  Interval development of small right-sided pleural effusion and right basilar airspace opacities, possibly atelectasis though underlying infection is not excluded.  3.  Unchanged multifocal sclerotic osseous lesions worrisome for metastatic prostate cancer.  Above findings discussed with Michael Litter, PA ( interventional radiology) at [phone].  Original Report Authenticated By: Waynard Reeds, M.D.   Ct Guided Abscess Drain  04/05/2011  *RADIOLOGY REPORT*  Clinical Data: History of percutaneous drainage of hepatic abscess on 03/27/2011.  Follow-up CT yesterday demonstrated decompression at the level of the drain and undrained component of residual abscess posterior and lateral to the distal portion of the indwelling percutaneous drain.  The patient presents for potential repositioning versus placement of new hepatic drain.  CT GUIDED DRAINAGE OF HEPATIC ABSCESS  Sedation:  2.0 mg IV Versed;  100 mcg IV Fentanyl  Total Moderate Sedation Time: 30 minutes.  Procedure:  The procedure, risks, benefits,  and alternatives were explained to the patient.  Questions regarding the procedure were encouraged and answered. The patient understands and consents to the procedure.  The indwelling  hepatic drain and surrounding skin was prepped with betadine in a sterile fashion, and a sterile drape was applied covering the operative field.  A sterile gown and sterile gloves were used for the procedure. Local anesthesia was provided with 1% Lidocaine.  Initial CT was performed through the liver.  The indwelling drain was aspirated.  It was then manipulated with a guidewire.  The drain was then removed.  An 18 gauge trocar needle was advanced into the liver.  Aspiration was performed.  A guidewire was advanced.  After dilating wire access up to 10-French, a 10-French percutaneous drainage catheter was placed.  Aspiration was performed through the drain and the drain additionally retracted.  The drain was ultimately removed.  Complications: None  Findings: Wire manipulation of the preexisting drain did not allow access of the undrained component of abscess in the right lobe of the liver.  This area was therefore repunctured.  Aspiration yielded a small amount of bloody fluid containing some debris.  A sample of the fluid was sent for culture analysis.  Initial guide wire advancement was confined to the region of this undrained component of abscess.  After drain placement, the drain clearly advanced through the fluid collection into the medial aspect of the liver.  As the drain was withdrawn, there was some evidence of bleeding into the liver tract and the drain became occluded by clot.  As the drain was retracted into the region of the residual abscess fluid, further aspiration of bloody fluid was performed. Given positioning of the drain, it was not felt that leaving an indwelling drain would benefit the patient.  Follow-up CT with contrast may be helpful after further appropriate treatment to determine if there is any need for additional percutaneous drainage procedures.  IMPRESSION: The preexisting hepatic drain could not be manipulated into a position to drain the residual fluid identified by CT in the  liver. This site was repunctured and fluid withdrawn and sent for culture analysis.  A percutaneous drain was advanced over a guidewire which ended up traversing through the residual fluid collection into the liver parenchyma.  This did result in some tract bleeding that occluded the catheter.  There was ability to aspirate some additional bloody fluid from the residual fluid collection.  Given the behavior of the catheter, a drain was not left in place.  Original Report Authenticated By: Reola Calkins, M.D.   Nm Bone Scan Whole Body  04/18/2011  *RADIOLOGY REPORT*  Clinical Data: Evaluate for metastatic cancer.  Blastic lesions on CT.  NUCLEAR MEDICINE WHOLE BODY BONE SCINTIGRAPHY  Technique:  Whole body anterior and posterior images were obtained approximately 3 hours after intravenous injection of radiopharmaceutical.  Radiopharmaceutical: 26.5 mCi technetium 33m MDP IV.  Comparison: CT 03/25/2011.  Findings: The blastic areas within the ribs, lumbar spine, and iliac bones did not demonstrate increased radiotracer activity. There is a focus of increased activity within the left scapula inferior to the glenoid.  Focus of increased activity noted within the upper right calvarium.  Suspect increased activity within the left superior pubic ramus.  This may correspond to a sclerotic area seen on prior CT.  The other areas were not imaged on the prior CT abdomen pelvis.  IMPRESSION: Focal areas of increased activity within the left scapula, right calvarium and left superior pubic ramus.  The  appearance could certainly represent metastatic disease.  The other blastic areas on CT do not demonstrate increased radiotracer uptake.  Original Report Authenticated By: Cyndie Chime, M.D.   Liver biopsy- Gram Stain ABUNDANT WBC PRESENT,BOTH PMN AND MONONUCLEAR MODERATE GRAM POSITIVE COCCI IN PAIRS IN CHAINS  Admission HPI:  Patient is a 70 y.o. male with a PMHx of CAD s/p stenting, BPH, HTN who presents to [facility] for  evaluation of weakness x2 weeks and nausea and vomiting times one week. The patient notes that he was in his usual state of health about 2 weeks ago at which time he noted increased weakness and fatigue, secondary to which he had trouble with daily day-to-day activities. The weakness and fatigue continued to persist, then approximately one week ago he had severe nausea and vomiting x one day of mainly food products and otherwise nonbilious and nonbloody vomitus. During this one week prior to admission he continued to have decreased appetite and some intermittent nausea but no vomiting. However on the evening prior to admission the patient again had an multiple vomiting episodes and rigors. Secondary to the severity of his symptoms, the patient's wife finally convinced him to seek medical evaluation on the morning of admission. His wife notes that the patient's weakness was so severe that he required help with ambulation just to the hospital. The patient also confirmed dysuria, frequent sweating episodes, and the patient's wife does indicate that he looks more jaundiced than his baseline. He had similar episode of nausea and rigors 1 month prior to admission, although he did not seek medical care at that time. The patient otherwise otherwise denied pain with defecation, fevers, abdominal pain, urinary retention, sore throat, cough, nasal congestion, confusion, headaches, change in vision. Weakness occurs throughout his body, and is not manifested as a focal unilateral weakness.  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 He also notes long-standing prostate issues, for which he is followed by a urologist, Dr. Clarene Duke. He frequently calls his urologist, if he has increased urinary frequency, at which time he is prescribed antibiotics.   Admission Vital Signs:  T:  103 --> 100.4   P:  102 --> 65  BP:  102/48  RR:  18  O2 sat:  96% on RA   Admission Physical Exam:  General:  Vital signs reviewed and noted. Well-developed, well-nourished, in no acute distress; alert, appropriate and cooperative throughout examination. Clammy in appearance.   Head:  Normocephalic, atraumatic.   Eyes:  PERRL, EOMI, No signs of anemia. Mild scleral icterus.   Nose:  Mucous membranes moist, not inflammed, nonerythematous.   Throat:  Oropharynx nonerythematous, no exudate appreciated, mildly dry mucous membranes.   Neck:  No deformities, masses, or tenderness noted.Supple, No carotid Bruits, no JVD.   Lungs:  Normal respiratory effort. Clear to auscultation BL without crackles or wheezes.   Heart:  RRR. S1 and S2 normal without gallop, murmur, or rubs.   Abdomen:  BS normoactive. Soft, Nondistended, non-tender. No masses or organomegaly.   Extremities:  No pretibial edema. No CVA tenderness.   Neurologic:  A&O X3, CN II - XII are grossly intact. Motor strength is 5/5 in the all 4 extremities, Sensations intact to light touch. HTS and FTN intact. Gait normal and Romberg negative.   Skin:  No visible rashes. Cholecystectomy scar noted on RUQ of abdomen.  Genital:  No tenderness to palpation of prostate on rectal exam. No scrotal or penile lesions, erythema, warmth, rashes.    Admission Lab results:  Basic Metabolic Panel:  Recent Labs   [facility]  03/25/11 1030    NA  139    K  3.5    CL  105    CO2  24    GLUCOSE  126*    BUN  26*    CREATININE  1.09    CALCIUM  8.7    MG  --    PHOS  --    Liver Function Tests:  Recent Labs   [facility]  03/25/11 1030    AST  33    ALT  39    ALKPHOS  77    BILITOT  2.1*    PROT  5.8*    ALBUMIN  2.6*    Recent Labs   [facility]  03/25/11 1030    LIPASE  15    AMYLASE  --    CBC:  Recent Labs   Basename  03/25/11 1030    WBC  12.2*    NEUTROABS  11.3*    HGB  12.6*    HCT  36.7*    MCV  86.8    PLT  103*    Urinalysis:   Ref.  Range  03/25/2011 10:26   Color, Urine  Latest Range: YELLOW  ORANGE (A)   Appearance  Latest Range: CLEAR  CLOUDY (A)   Specific Gravity, Urine  Latest Range: 1.005-1.030  1.030   pH  Latest Range: 5.0-8.0  5.5   Glucose, UA  Latest Range: NEGATIVE mg/dL  NEGATIVE   Bilirubin Urine  Latest Range: NEGATIVE  MODERATE (A)   Ketones, ur  Latest Range: NEGATIVE mg/dL  40 (A)   Protein  Latest Range: NEGATIVE mg/dL  409 (A)   Urobilinogen, UA  Latest Range: 0.0-1.0 mg/dL  1.0   Nitrite  Latest Range: NEGATIVE  POSITIVE (A)   Leukocytes, UA  Latest Range: NEGATIVE  SMALL (A)   Urine-Other  No range found  MUCOUS PRESENT   WBC, UA  Latest Range: <3 WBC/hpf  7-10   Squamous Epithelial / LPF  Latest Range: RARE  RARE   Casts  Latest Range: NEGATIVE  HYALINE CASTS (A)     Hospital Course by problem list: #Hepatic abscess: Patient presented with fevers, rigors and weakness with no obvious source as her had no evidence of skin infection, prostatitis, pneumonia or UTI. After abdominal CT noted lesion in liver that was metastasis vs. abscess, a liver biopsy was obtained by interventional radiology. The gram stain from the biopsy showed abundant WBCs and gram positive cocci in pairs and chains. Initial gram stain grew E. Coli and microaerophilic streptococci. Patient was started on IV vancomycin and zosyn inititally which was then reduced to zosyn alone. He was sent out on an oral regimen of levofloxacin and metronidazole with follow up with infectious disease clinic.  #Sclerotic Bone lesions: patient and his family members were informed that he likely had prostate cancer given his weight loss, fevers, and findings of sclerotic bone lesions and liver mass. After the liver mass was found to be an infection and not a metastasis he was informed that cancer is still a possibility although it is not a certainty and he would need further testing to determine the cause. His PSA was only mildly elevated and his CEA  antigen was within normal limits.  Discharge Vitals:  Temp: 98.7   HR: 76   RR: 20   BP: 147/82 O2 Sat: 92% RA  Discharge Labs:    Ref. Range 03/28/2011 13:07  WBC Latest Range: 4.0-10.5 K/uL 8.4  RBC Latest Range: 4.22-5.81 MIL/uL 4.33  HGB Latest Range: 13.0-17.0 g/dL 44.0 (L)  HCT Latest Range: 39.0-52.0 % 37.6 (L)  MCV Latest Range: 78.0-100.0 fL 86.8  MCH Latest Range: 26.0-34.0 pg 28.9  MCHC Latest Range: 30.0-36.0 g/dL 10.2  RDW Latest Range: 11.5-15.5 % 12.7  Platelets Latest Range: 150-400 K/uL 140 (L)     Ref. Range 03/27/2011 04:02  Sodium Latest Range: 135-145 mEq/L 137  Potassium Latest Range: 3.5-5.3 mEq/L 3.7  Chloride Latest Range: 96-112 mEq/L 105  CO2 Latest Range: 19-32 mEq/L 25  BUN Latest Range: 6-23 mg/dL 15  Creat Latest Range: 0.50-1.35 mg/dL 7.25  Calcium Latest Range: 8.4-10.5 mg/dL 7.9 (L)  GFR calc non Af Amer Latest Range: >90 mL/min 80 (L)  GFR calc Af Amer Latest Range: >90 mL/min >90  Glucose Latest Range: 70-99 mg/dL 366 (H)    Signed: KAPADIA, Wynter Grave 04/30/2011, 7:32 PM

## 2011-05-03 ENCOUNTER — Encounter: Payer: Self-pay | Admitting: Internal Medicine

## 2011-05-03 ENCOUNTER — Ambulatory Visit (INDEPENDENT_AMBULATORY_CARE_PROVIDER_SITE_OTHER): Payer: Medicare Other | Admitting: Internal Medicine

## 2011-05-03 ENCOUNTER — Telehealth: Payer: Self-pay | Admitting: *Deleted

## 2011-05-03 VITALS — BP 109/69 | HR 66 | Temp 97.7°F | Ht 71.0 in | Wt 192.8 lb

## 2011-05-03 DIAGNOSIS — N400 Enlarged prostate without lower urinary tract symptoms: Secondary | ICD-10-CM

## 2011-05-03 DIAGNOSIS — K750 Abscess of liver: Secondary | ICD-10-CM

## 2011-05-03 MED ORDER — LEVOFLOXACIN 750 MG PO TABS: 750.0000 mg | ORAL_TABLET | Freq: Every day | ORAL | Status: DC

## 2011-05-03 NOTE — Progress Notes (Signed)
  Subjective:    Patient ID: Alexander Burton, male    DOB: [birth year], 70 y.o.   MRN: [id number]  HPI Mr. Gavigan is in for his routine followup visit. He is now completing 6 weeks of oral antibiotic therapy for his liver abscess. He is continuing to improve. He has had no more fever and his appetite is improving. 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 He was told that his bone scan did not show any strong evidence of bone cancer. He is scheduled to have a prostate biopsy by Dr. Darvin Neighbours in early January.    Review of Systems     Objective:   Physical Exam  Constitutional: He appears well-developed and well-nourished. No distress.  HENT:  Mouth/Throat: Oropharynx is clear and moist. No oropharyngeal exudate.  Cardiovascular: Normal rate, regular rhythm and normal heart sounds.   No murmur heard. Pulmonary/Chest: Breath sounds normal. He has no wheezes. He has no rales.  Abdominal: Soft. He exhibits no mass. There is no tenderness.  Psychiatric: He has a normal mood and affect.          Assessment & Plan:

## 2011-05-03 NOTE — Telephone Encounter (Signed)
Pt to f/u w/ Dr. Darvin Neighbours.  Faxing OV notes from today's visit.

## 2011-05-03 NOTE — Assessment & Plan Note (Signed)
His recent followup CT scan showed a marked decrease in the hypodense area in the right hepatic lobe. There is no residual abscess in the hypodense area is now only 27 x 28 mm in diameter. I strongly suspect that he has been cured and have recommended that he stop all antibiotics at this time. I suspect that his continued problem with anorexia may be related to his antibiotics. Dr. Earlene Plater as recommended that he be on Levaquin for 2 weeks before his biopsy. I've agreed to refill Levaquin which she will start 2 weeks before the procedure. He knows to call me if he has any problems suggesting a recurrence of his abscess.

## 2011-05-31 DIAGNOSIS — N400 Enlarged prostate without lower urinary tract symptoms: Secondary | ICD-10-CM | POA: Diagnosis not present

## 2011-05-31 DIAGNOSIS — R972 Elevated prostate specific antigen [PSA]: Secondary | ICD-10-CM | POA: Diagnosis not present

## 2011-05-31 DIAGNOSIS — K750 Abscess of liver: Secondary | ICD-10-CM | POA: Diagnosis not present

## 2011-05-31 DIAGNOSIS — N529 Male erectile dysfunction, unspecified: Secondary | ICD-10-CM | POA: Diagnosis not present

## 2011-05-31 DIAGNOSIS — K4090 Unilateral inguinal hernia, without obstruction or gangrene, not specified as recurrent: Secondary | ICD-10-CM | POA: Diagnosis not present

## 2011-05-31 DIAGNOSIS — R351 Nocturia: Secondary | ICD-10-CM | POA: Diagnosis not present

## 2011-05-31 DIAGNOSIS — E291 Testicular hypofunction: Secondary | ICD-10-CM | POA: Diagnosis not present

## 2011-05-31 DIAGNOSIS — M949 Disorder of cartilage, unspecified: Secondary | ICD-10-CM | POA: Diagnosis not present

## 2011-06-19 ENCOUNTER — Ambulatory Visit (INDEPENDENT_AMBULATORY_CARE_PROVIDER_SITE_OTHER): Payer: Medicare Other | Admitting: Internal Medicine

## 2011-06-19 ENCOUNTER — Encounter: Payer: Self-pay | Admitting: Internal Medicine

## 2011-06-19 VITALS — BP 137/86 | HR 63 | Temp 97.4°F | Ht 71.0 in | Wt 197.5 lb

## 2011-06-19 DIAGNOSIS — K750 Abscess of liver: Secondary | ICD-10-CM | POA: Diagnosis not present

## 2011-06-19 NOTE — Progress Notes (Signed)
Patient ID: Alexander Burton, male   DOB: 26-Dec-1940, 71 y.o.   MRN: [id number]  INFECTIOUS DISEASE PROGRESS NOTE    Subjective: [name] is in with his wife for his routine followup visit. He completed antibiotics for his liver abscess in early December. He did have a brief course of levofloxacin prior to his prostate biopsy earlier this month. Fortunately the biopsy did not show any evidence of cancer. In general he is feeling better. His appetite is good and he has resumed most of his usual activities. However, he did have one mild chill a week ago and he's had several episodes of "hot flashes" over the past month. These generally occur about 2 AM and may be associated with a mild sweat. He has not taken his temperature.  Objective:   General: He is in good spirits and appears in good health. His weight is up 5 pounds but he is wearing heavy work boots. Skin: No rash Lungs: Clear Cor: Regular S1 and S2 and no murmurs Abdomen: Soft and nontender   Assessment: I suspect that his liver abscess has been cured but I'm concerned about the one episode of chills and the possible nighttime fevers and sweats. I've asked him to take his temperature when he has these episodes and to call me in 2 weeks.  Plan: 1. Observe off of antibiotics 2. Monitor his temperatures and have him call me within the next 2 weeks   Cliffton Asters, MD [facility] [phone] pager   [phone] cell 06/19/2011, 10:26 AM

## 2011-06-29 ENCOUNTER — Encounter (INDEPENDENT_AMBULATORY_CARE_PROVIDER_SITE_OTHER): Payer: Self-pay | Admitting: *Deleted

## 2011-07-13 ENCOUNTER — Other Ambulatory Visit (INDEPENDENT_AMBULATORY_CARE_PROVIDER_SITE_OTHER): Payer: Self-pay | Admitting: *Deleted

## 2011-07-13 ENCOUNTER — Telehealth (INDEPENDENT_AMBULATORY_CARE_PROVIDER_SITE_OTHER): Payer: Self-pay | Admitting: *Deleted

## 2011-07-13 DIAGNOSIS — Z800 Family history of malignant neoplasm of digestive organs: Secondary | ICD-10-CM

## 2011-07-13 DIAGNOSIS — Z86010 Personal history of colonic polyps: Secondary | ICD-10-CM

## 2011-07-13 DIAGNOSIS — R195 Other fecal abnormalities: Secondary | ICD-10-CM

## 2011-07-13 NOTE — Telephone Encounter (Signed)
Patient needs movi prep 

## 2011-07-16 MED ORDER — PEG-KCL-NACL-NASULF-NA ASC-C 100 G PO SOLR: 1.0000 | Freq: Once | ORAL | Status: DC

## 2011-07-25 ENCOUNTER — Telehealth (INDEPENDENT_AMBULATORY_CARE_PROVIDER_SITE_OTHER): Payer: Self-pay | Admitting: *Deleted

## 2011-07-25 NOTE — Telephone Encounter (Signed)
agree

## 2011-07-25 NOTE — Telephone Encounter (Signed)
Requesting MD/PCP:  zack hall     Name & DOB: Alexander Burton December 27, 2040     Procedure: TCS  Reason/Indication:  Hx polyps, fam hx colon ca, loose stool  Has patient had this procedure before?  yes  If so, when, by whom and where?  2/08  Is there a family history of colon cancer?  yes  Who?  What age when diagnosed?  sister  Is patient diabetic?   no      Does patient have prosthetic heart valve?  no  Do you have a pacemaker?  no  Has patient had joint replacement within last 12 months?  no  Is patient on Coumadin, Plavix and/or Aspirin? yes  Medications: asa 81 mg daily, metoprolol 25 mg daily, finasteride 5 mg daily, nexium 40 mg daily, pravastatin 20 mg daily, centrum silver, fish oil  Allergies: nkda  Pharmacy: [facility]  Medication Adjustment: asa 2 days  Procedure date & time: 08/16/11 at 1200

## 2011-08-07 MED ORDER — SODIUM CHLORIDE 0.9 % IJ SOLN
INTRAMUSCULAR | Status: AC
  Filled 2011-08-07: qty 10

## 2011-08-13 ENCOUNTER — Encounter (HOSPITAL_COMMUNITY): Payer: Self-pay | Admitting: Pharmacy Technician

## 2011-08-15 MED ORDER — SODIUM CHLORIDE 0.45 % IV SOLN
Freq: Once | INTRAVENOUS | Status: AC
  Administered 2011-08-16: 12:00:00 via INTRAVENOUS

## 2011-08-16 ENCOUNTER — Ambulatory Visit (HOSPITAL_COMMUNITY)
Admission: RE | Admit: 2011-08-16 | Discharge: 2011-08-16 | Disposition: A | Discharge status: Home / Self Care | Payer: Medicare Other | Source: Ambulatory Visit | Attending: Internal Medicine | Admitting: Internal Medicine

## 2011-08-16 ENCOUNTER — Encounter (HOSPITAL_COMMUNITY)
Admission: RE | Disposition: A | Discharge status: Home / Self Care | Payer: Self-pay | Source: Ambulatory Visit | Attending: Internal Medicine

## 2011-08-16 ENCOUNTER — Encounter (HOSPITAL_COMMUNITY): Payer: Self-pay | Admitting: *Deleted

## 2011-08-16 DIAGNOSIS — D126 Benign neoplasm of colon, unspecified: Secondary | ICD-10-CM | POA: Diagnosis not present

## 2011-08-16 DIAGNOSIS — Z860100 Personal history of colon polyps, unspecified: Secondary | ICD-10-CM | POA: Insufficient documentation

## 2011-08-16 DIAGNOSIS — K5730 Diverticulosis of large intestine without perforation or abscess without bleeding: Secondary | ICD-10-CM

## 2011-08-16 DIAGNOSIS — Z7982 Long term (current) use of aspirin: Secondary | ICD-10-CM | POA: Diagnosis not present

## 2011-08-16 DIAGNOSIS — Z800 Family history of malignant neoplasm of digestive organs: Secondary | ICD-10-CM

## 2011-08-16 DIAGNOSIS — R195 Other fecal abnormalities: Secondary | ICD-10-CM

## 2011-08-16 HISTORY — PX: COLONOSCOPY: SHX5424

## 2011-08-16 HISTORY — DX: Polyp of colon: K63.5

## 2011-08-16 SURGERY — COLONOSCOPY
Anesthesia: Moderate Sedation

## 2011-08-16 MED ORDER — MIDAZOLAM HCL 5 MG/5ML IJ SOLN
INTRAMUSCULAR | Status: AC
  Filled 2011-08-16: qty 10

## 2011-08-16 MED ORDER — SIMETHICONE 40 MG/0.6ML PO SUSP
ORAL | Status: DC | PRN
  Administered 2011-08-16: 12:00:00

## 2011-08-16 MED ORDER — MIDAZOLAM HCL 5 MG/5ML IJ SOLN
INTRAMUSCULAR | Status: DC | PRN
  Administered 2011-08-16 (×2): 2 mg via INTRAVENOUS

## 2011-08-16 MED ORDER — MEPERIDINE HCL 50 MG/ML IJ SOLN
INTRAMUSCULAR | Status: DC | PRN
  Administered 2011-08-16 (×2): 25 mg via INTRAVENOUS

## 2011-08-16 MED ORDER — MEPERIDINE HCL 50 MG/ML IJ SOLN
INTRAMUSCULAR | Status: AC
  Filled 2011-08-16: qty 1

## 2011-08-16 NOTE — H&P (Signed)
Alexander Burton is an 71 y.o. male.   Chief Complaint: Patient is here for colonoscopy. HPI: Patient is a 71 year old Caucasian male with history of colonic adenomas. He's had for a surveillance colonoscopy. He denies abdominal pain or change in his bowel habits. He also denies rectal bleeding. A few months ago he was hospitalized at [facility]  for liver abscess felt to be related to extreme the following dental work. Family history is negative for colorectal carcinoma/  Past Medical History  Diagnosis Date  . Atrial fibrillation   . CAD (coronary artery disease)   . Prostate ca   . BPH (benign prostatic hyperplasia)   . Gastritis   . Osteoarthritis   . Melanoma     sigmoid diverticulosis  . Goiter   . Colon polyps    recent hospitalization at [facility] age for liver abscess in October 2012  Past Surgical History  Procedure Date  . Cholecystectomy   . Surgical resection, melanoma     Family History  Problem Relation Age of Onset  . Cancer Sister     colon  . Colon cancer Neg Hx    Social History:  reports that he quit smoking about 4 years ago. His smoking use included Cigarettes. He has a 75 pack-year smoking history. He has never used smokeless tobacco. He reports that he drinks about .5 ounces of alcohol per week. He reports that he does not use illicit drugs.  Allergies: No Known Allergies  Medications Prior to Admission  Medication Dose Route Frequency Provider Last Rate Last Dose  . 0.45 % sodium chloride infusion   Intravenous Once Malissa Hippo, MD 20 mL/hr at 08/16/11 1131    . meperidine (DEMEROL) 50 MG/ML injection           . midazolam (VERSED) 5 MG/5ML injection           . simethicone susp in sterile water 1000 mL irrigation    PRN Malissa Hippo, MD      . sodium chloride 0.9 % injection            Medications Prior to Admission  Medication Sig Dispense Refill  . aspirin EC 81 MG tablet Take 81 mg by mouth daily.      . Cyanocobalamin (VITAMIN B-12 PO) Take 1  tablet by mouth daily.      Marland Kitchen esomeprazole (NEXIUM) 40 MG capsule Take 40 mg by mouth daily before breakfast.        . finasteride (PROSCAR) 5 MG tablet Take 5 mg by mouth daily.        . metoprolol succinate (TOPROL-XL) 25 MG 24 hr tablet Take 25 mg by mouth daily.        . Multiple Vitamin (MULITIVITAMIN WITH MINERALS) TABS Take 1 tablet by mouth daily.      . naproxen sodium (ALEVE) 220 MG tablet Take 220 mg by mouth every 8 (eight) hours as needed. For pain      . peg 3350 powder (MOVIPREP) 100 G SOLR Take 1 kit (100 g total) by mouth once.  1 kit  0  . pravastatin (PRAVACHOL) 20 MG tablet Take 20 mg by mouth daily.        . nitroGLYCERIN (NITROSTAT) 0.4 MG SL tablet Place 0.4 mg under the tongue every 5 (five) minutes as needed. For chest pains        No results found for this or any previous visit (from the past 48 hour(s)). No results found.  Review  of Systems  Constitutional: Negative for weight loss.  Gastrointestinal: Negative for abdominal pain, diarrhea, constipation, blood in stool and melena.    Blood pressure 130/85, pulse 59, temperature 97.7 F (36.5 C), temperature source Oral, resp. rate 14, height 5\' 11"  (1.803 m), weight 197 lb (89.359 kg), SpO2 99.00%. Physical Exam  Constitutional: He appears well-developed and well-nourished.  HENT:  Mouth/Throat: Oropharynx is clear and moist.  Eyes: Conjunctivae are normal.  Neck: No thyromegaly present.  Cardiovascular: Normal rate, regular rhythm and normal heart sounds.   No murmur heard. Respiratory: Effort normal and breath sounds normal.  GI: Soft. He exhibits no distension and no mass. There is no tenderness.  Musculoskeletal: He exhibits no edema.  Lymphadenopathy:    He has no cervical adenopathy.  Skin: Skin is warm and dry.     Assessment/Plan History of colonic polyps. Surveillance colonoscopy  Sterlin Knightly U 08/16/2011, 12:17 PM

## 2011-08-16 NOTE — Op Note (Signed)
COLONOSCOPY PROCEDURE REPORT  PATIENT:  Alexander Burton  MR#:  [id number] Birthdate:  August 06, 1940, 71 y.o., male Endoscopist:  Dr. Malissa Hippo, MD Referred By:  Dr. Dwana Melena, MD Procedure Date: 08/16/2011  Procedure:   Colonoscopy with snare polypectomy.  Indications:  Patient is a 71 year old Caucasian male with history of colonic adenomas. His last exam was in November 2007. He has irregular bowel movements but denies melena or rectal bleeding.  Informed Consent:  The procedure and risks were reviewed with the patient and informed consent was obtained.  Medications:  Demerol 50 mg IV Versed 4 mg IV  Description of procedure:  After a digital rectal exam was performed, that colonoscope was advanced from the anus through the rectum and colon to the area of the cecum, ileocecal valve and appendiceal orifice. The cecum was deeply intubated. These structures were well-seen and photographed for the record. From the level of the cecum and ileocecal valve, the scope was slowly and cautiously withdrawn. The mucosal surfaces were carefully surveyed utilizing scope tip to flexion to facilitate fold flattening as needed. The scope was pulled down into the rectum where a thorough exam including retroflexion was performed.  Findings:   Prep excellent. Multiple diverticula at  sigmoid colon.. About 12 x 5 mm sessile polyp at cecum next to a scar felt to be  recurrence at previous site. I was able to elevate this polyp with saline injection and  it was snared.  Right edge was coagulated using snare tip. Single Hemoclip applied to polypectomy site. 5 to 6 mm polyp at transverse colon. Approach to this polyp was difficult with the snare. This polyp was ablated via cold biopsy.  Rest of the mucosa was normal The rectal mucosa and anal rectal junction unremarkable.   Therapeutic/Diagnostic Maneuvers Performed:  See above  Complications:  None  Cecal Withdrawal Time:  20 minutes  Impression:    Examination performed to cecum. 12 x 5 mm Sessile cecal polyp snared following saline injection and Hemoclip applied to polypectomy site. Small polyp ablated via cold biopsy from transverse colon. Multiple diverticula at sigmoid colon.  Recommendations:  Standard instructions given. Allergy contacting patient with results of biopsy and further recommendations.  Charistopher Rumble U  08/16/2011 12:54 PM  CC: Dr. Dwana Melena, MD, MD & Dr. Bonnetta Barry ref. provider found

## 2011-08-16 NOTE — Discharge Instructions (Signed)
No aspirin for one week. High fiber diet. No driving for 24 hours. Please remember you cannot have MRI until you have past Hemoclip. Physician Will contact him with biopsy resultsDiverticulosis Diverticulosis is a common condition that develops when small pouches (diverticula) form in the wall of the colon. The risk of diverticulosis increases with age. It happens more often in people who eat a low-fiber diet. Most individuals with diverticulosis have no symptoms. Those individuals with symptoms usually experience abdominal pain, constipation, or loose stools (diarrhea). HOME CARE INSTRUCTIONS   Increase the amount of fiber in your diet as directed by your caregiver or dietician. This may reduce symptoms of diverticulosis.   Your caregiver may recommend taking a dietary fiber supplement.   Drink at least 6 to 8 glasses of water each day to prevent constipation.   Try not to strain when you have a bowel movement.   Your caregiver may recommend avoiding nuts and seeds to prevent complications, although this is still an uncertain benefit.   Only take over-the-counter or prescription medicines for pain, discomfort, or fever as directed by your caregiver.  FOODS WITH HIGH FIBER CONTENT INCLUDE:  Fruits. Apple, peach, pear, tangerine, raisins, prunes.   Vegetables. Brussels sprouts, asparagus, broccoli, cabbage, carrot, cauliflower, romaine lettuce, spinach, summer squash, tomato, winter squash, zucchini.   Starchy Vegetables. Baked beans, kidney beans, lima beans, split peas, lentils, potatoes (with skin).   Grains. Whole wheat bread, brown rice, bran flake cereal, plain oatmeal, white rice, shredded wheat, bran muffins.  SEEK IMMEDIATE MEDICAL CARE IF:   You develop increasing pain or severe bloating.   You have an oral temperature above 102 F (38.9 C), not controlled by medicine.   You develop vomiting or bowel movements that are bloody or black.  Document Released: 02/09/2004  Document Revised: 05/03/2011 Document Reviewed: 10/12/2009 [facility] Patient Information 2012 [place], [place].Colon Polyps A polyp is extra tissue that grows inside your body. Colon polyps grow in the large intestine. The large intestine, also called the colon, is part of your digestive system. It is a long, hollow tube at the end of your digestive tract where your body makes and stores stool. Most polyps are not dangerous. They are benign. This means they are not cancerous. But over time, some types of polyps can turn into cancer. Polyps that are smaller than a pea are usually not harmful. But larger polyps could someday become or may already be cancerous. To be safe, doctors remove all polyps and test them.  WHO GETS POLYPS? Anyone can get polyps, but certain people are more likely than others. You may have a greater chance of getting polyps if:  You are over 50.   You have had polyps before.   Someone in your family has had polyps.   Someone in your family has had cancer of the large intestine.   Find out if someone in your family has had polyps. You may also be more likely to get polyps if you:   Eat a lot of fatty foods.   Smoke.   Drink alcohol.   Do not exercise.   Eat too much.  SYMPTOMS  Most small polyps do not cause symptoms. People often do not know they have one until their caregiver finds it during a regular checkup or while testing them for something else. Some people do have symptoms like these:  Bleeding from the anus. You might notice blood on your underwear or on toilet paper after you have had a bowel movement.  Constipation or diarrhea that lasts more than a week.   Blood in the stool. Blood can make stool look black or it can show up as red streaks in the stool.  If you have any of these symptoms, see your caregiver. HOW DOES THE DOCTOR TEST FOR POLYPS? The doctor can use four tests to check for polyps:  Digital rectal exam. The caregiver wears gloves and  checks your rectum (the last part of the large intestine) to see if it feels normal. This test would find polyps only in the rectum. Your caregiver may need to do one of the other tests listed below to find polyps higher up in the intestine.   Barium enema. The caregiver puts a liquid called barium into your rectum before taking x-rays of your large intestine. Barium makes your intestine look white in the pictures. Polyps are dark, so they are easy to see.   Sigmoidoscopy. With this test, the caregiver can see inside your large intestine. A thin flexible tube is placed into your rectum. The device is called a sigmoidoscope, which has a light and a tiny video camera in it. The caregiver uses the sigmoidoscope to look at the last third of your large intestine.   Colonoscopy. This test is like sigmoidoscopy, but the caregiver looks at all of the large intestine. It usually requires sedation. This is the most common method for finding and removing polyps.  TREATMENT   The caregiver will remove the polyp during sigmoidoscopy or colonoscopy. The polyp is then tested for cancer.   If you have had polyps, your caregiver may want you to get tested regularly in the future.  PREVENTION  There is not one sure way to prevent polyps. You might be able to lower your risk of getting them if you:  Eat more fruits and vegetables and less fatty food.   Do not smoke.   Avoid alcohol.   Exercise every day.   Lose weight if you are overweight.   Eating more calcium and folate can also lower your risk of getting polyps. Some foods that are rich in calcium are milk, cheese, and broccoli. Some foods that are rich in folate are chickpeas, kidney beans, and spinach.   Aspirin might help prevent polyps. Studies are under way.  Document Released: 02/08/2004 Document Revised: 05/03/2011 Document Reviewed: 07/16/2007 [facility] Patient Information 2012 [place], [place].Colonoscopy Care After Read the instructions  outlined below and refer to this sheet in the next few weeks. These discharge instructions provide you with general information on caring for yourself after you leave the hospital. Your doctor may also give you specific instructions. While your treatment has been planned according to the most current medical practices available, unavoidable complications occasionally occur. If you have any problems or questions after discharge, call your doctor. HOME CARE INSTRUCTIONS ACTIVITY:  You may resume your regular activity, but move at a slower pace for the next 24 hours.   Take frequent rest periods for the next 24 hours.   Walking will help get rid of the air and reduce the bloated feeling in your belly (abdomen).   No driving for 24 hours (because of the medicine (anesthesia) used during the test).   You may shower.   Do not sign any important legal documents or operate any machinery for 24 hours (because of the anesthesia used during the test).  NUTRITION:  Drink plenty of fluids.   You may resume your normal diet as instructed by your doctor.   Begin  with a light meal and progress to your normal diet. Heavy or fried foods are harder to digest and may make you feel sick to your stomach (nauseated).   Avoid alcoholic beverages for 24 hours or as instructed.  MEDICATIONS:  You may resume your normal medications unless your doctor tells you otherwise.  WHAT TO EXPECT TODAY:  Some feelings of bloating in the abdomen.   Passage of more gas than usual.   Spotting of blood in your stool or on the toilet paper.  IF YOU HAD POLYPS REMOVED DURING THE COLONOSCOPY:  No aspirin products for 7 days or as instructed.   No alcohol for 7 days or as instructed.   Eat a soft diet for the next 24 hours.  FINDING OUT THE RESULTS OF YOUR TEST Not all test results are available during your visit. If your test results are not back during the visit, make an appointment with your caregiver to find out  the results. Do not assume everything is normal if you have not heard from your caregiver or the medical facility. It is important for you to follow up on all of your test results.  SEEK IMMEDIATE MEDICAL CARE IF:  You have more than a spotting of blood in your stool.   Your belly is swollen (abdominal distention).   You are nauseated or vomiting.   You have a fever.   You have abdominal pain or discomfort that is severe or gets worse throughout the day.  Document Released: 12/27/2003 Document Revised: 05/03/2011 Document Reviewed: 12/25/2007 [facility] Patient Information 2012 [place], [place].

## 2011-08-20 ENCOUNTER — Encounter (HOSPITAL_COMMUNITY): Payer: Self-pay | Admitting: Internal Medicine

## 2011-08-23 ENCOUNTER — Ambulatory Visit (HOSPITAL_BASED_OUTPATIENT_CLINIC_OR_DEPARTMENT_OTHER): Payer: Medicare Other | Admitting: Oncology

## 2011-08-23 ENCOUNTER — Other Ambulatory Visit (HOSPITAL_BASED_OUTPATIENT_CLINIC_OR_DEPARTMENT_OTHER): Payer: Medicare Other | Admitting: Lab

## 2011-08-23 VITALS — BP 135/83 | HR 57 | Temp 97.6°F | Ht 71.0 in | Wt 192.2 lb

## 2011-08-23 DIAGNOSIS — I10 Essential (primary) hypertension: Secondary | ICD-10-CM

## 2011-08-23 DIAGNOSIS — N400 Enlarged prostate without lower urinary tract symptoms: Secondary | ICD-10-CM | POA: Diagnosis not present

## 2011-08-23 DIAGNOSIS — M949 Disorder of cartilage, unspecified: Secondary | ICD-10-CM | POA: Diagnosis not present

## 2011-08-23 DIAGNOSIS — K750 Abscess of liver: Secondary | ICD-10-CM | POA: Diagnosis not present

## 2011-08-23 DIAGNOSIS — M899 Disorder of bone, unspecified: Secondary | ICD-10-CM

## 2011-08-23 LAB — COMPREHENSIVE METABOLIC PANEL
ALT: 19 U/L (ref 0–53)
AST: 21 U/L (ref 0–37)
Albumin: 3.8 g/dL (ref 3.5–5.2)
BUN: 12 mg/dL (ref 6–23)
CO2: 29 mEq/L (ref 19–32)
Calcium: 8.8 mg/dL (ref 8.4–10.5)
Chloride: 107 mEq/L (ref 96–112)
Potassium: 4.2 mEq/L (ref 3.5–5.3)

## 2011-08-23 LAB — CBC WITH DIFFERENTIAL/PLATELET
BASO%: 0.5 % (ref 0.0–2.0)
EOS%: 1.2 % (ref 0.0–7.0)
MCH: 29.4 pg (ref 27.2–33.4)
MCHC: 33.2 g/dL (ref 32.0–36.0)
MONO#: 0.3 10*3/uL (ref 0.1–0.9)
RDW: 14 % (ref 11.0–14.6)
WBC: 5.3 10*3/uL (ref 4.0–10.3)
lymph#: 1.3 10*3/uL (ref 0.9–3.3)

## 2011-08-23 NOTE — Progress Notes (Signed)
Hematology and Oncology Follow Up Visit  Alexander Burton [id number] 1941/04/09 71 y.o. 08/23/2011 12:31 PM   Principle Diagnosis: 71 -year-old with a history of prostatic enlargement and possible sclerotic bony lesions rule out metastatic malignancy vs. Benign etiology.    Interim History:  71 year old gentleman who I saw as an inpatient consult during his recent hospitalization in the end of October of 2012. At that time he was found to have a hepatic abscess and underwent CT-guided drainage of abscess as well as required intravenous antibiotics. During that hospitalization he underwent a CT scan of the abdomen and pelvis which showed a sclerotic bony lesions both in the ribs as well as the spine. Patient has a known history of benign prostatic as well as elevated PSA in the past however he does not have a history of prostate cancer. Patient had been followed by Dr. Gaynelle Burton and have had a mildly elevated PSA for years. He had had a prosthetic biopsy in the past that have been benign.    Since his last visit, Alexander Burton clinically is feeling well is not reporting any abdominal pain is not reporting any fevers or chills is not reporting any increase in his lower back pain he does not have any neurological symptoms does not have any paresthesias or anesthesia is. He did not have any change in his bowel habits overall he is regaining most activities of daily living.   Medications: I have reviewed the patient's current medications.  Allergies: No Known Allergies   Review of Systems: Constitutional:  Negative for fever, chills, night sweats, anorexia, weight loss, pain. Cardiovascular: no chest pain or dyspnea on exertion Respiratory: no cough, shortness of breath, or wheezing Neurological: negative Dermatological: negative ENT: negative Skin Gastrointestinal: no abdominal pain, change in bowel habits, or black or bloody stools Genito-Urinary: no dysuria, trouble voiding, or  hematuria Hematological and Lymphatic: negative Breast: negative Musculoskeletal: negative Remaining ROS negative.  Physical Exam: Blood pressure 135/83, pulse 57, temperature 97.6 F (36.4 C), temperature source Oral, height 5\' 11"  (1.803 m), weight 192 lb 3.2 oz (87.181 kg). ECOG: 1 General appearance: alert and no distress Head: Normocephalic, without obvious abnormality, atraumatic Neck: no adenopathy, no carotid bruit, no JVD, supple, symmetrical, trachea midline and thyroid not enlarged, symmetric, no tenderness/mass/nodules Lymph nodes: Cervical, supraclavicular, and axillary nodes normal. Heart:regular rate and rhythm, no murmurs or gallops Lung:chest clear, no wheezing, rales, normal symmetric air entry, Heart exam - S1, S2 normal, no murmur, no gallop, rate regular Abdomin:normal EXT:no erythema, induration, or nodules Lab Results: Lab Results  Component Value Date   WBC 5.3 08/23/2011   HGB 14.4 08/23/2011   HCT 43.3 08/23/2011   MCV 88.6 08/23/2011   PLT 137 Occ Large platelets present* 08/23/2011     Chemistry      Component Value Date/Time   NA 139 04/04/2011 1320   NA 139 04/04/2011 1320   K 4.3 04/04/2011 1320   K 4.3 04/04/2011 1320   CL 105 04/04/2011 1320   CL 105 04/04/2011 1320   CO2 26 04/04/2011 1320   CO2 26 04/04/2011 1320   BUN 15 04/04/2011 1320   BUN 15 04/04/2011 1320   CREATININE 0.98 04/04/2011 1320   CREATININE 0.98 04/04/2011 1320      Component Value Date/Time   CALCIUM 8.2* 04/04/2011 1320   CALCIUM 8.2* 04/04/2011 1320   ALKPHOS 78 04/04/2011 1320   ALKPHOS 78 04/04/2011 1320   AST 23 04/04/2011 1320   AST 23 04/04/2011  1320   ALT 27 04/04/2011 1320   ALT 27 04/04/2011 1320   BILITOT 0.5 04/04/2011 1320   BILITOT 0.5 04/04/2011 1320      Impression and Plan: 71 year old gentleman with the following issues:  1. Questionable sclerotic bony lesions in the spine that could be related to malignancy versus benign bone findings. I have personally reviewed  these imaging studies with radiology and as mentioned the differential diagnosis includes possible benign causes however metastatic malignancy could not be completely ruled out. I will repeat his bone scan in 3-4 months and compare to his previous one. If there are changes at that time, I will consider a bone biopsy.   2. Hepatic abscess he has a drain in place and followed by infectious disease Alexander Burton and intervention radiology.    Alexander Hose, MD 3/28/201312:31 PM

## 2011-08-29 ENCOUNTER — Encounter (INDEPENDENT_AMBULATORY_CARE_PROVIDER_SITE_OTHER): Payer: Self-pay | Admitting: *Deleted

## 2011-09-04 DIAGNOSIS — H1045 Other chronic allergic conjunctivitis: Secondary | ICD-10-CM | POA: Diagnosis not present

## 2011-09-04 DIAGNOSIS — H0230 Blepharochalasis unspecified eye, unspecified eyelid: Secondary | ICD-10-CM | POA: Diagnosis not present

## 2011-09-04 DIAGNOSIS — H04129 Dry eye syndrome of unspecified lacrimal gland: Secondary | ICD-10-CM | POA: Diagnosis not present

## 2011-09-04 DIAGNOSIS — H2510 Age-related nuclear cataract, unspecified eye: Secondary | ICD-10-CM | POA: Diagnosis not present

## 2011-10-30 DIAGNOSIS — I10 Essential (primary) hypertension: Secondary | ICD-10-CM | POA: Diagnosis not present

## 2011-10-30 DIAGNOSIS — I2510 Atherosclerotic heart disease of native coronary artery without angina pectoris: Secondary | ICD-10-CM | POA: Diagnosis not present

## 2011-10-30 DIAGNOSIS — Z9861 Coronary angioplasty status: Secondary | ICD-10-CM | POA: Diagnosis not present

## 2011-10-30 DIAGNOSIS — E782 Mixed hyperlipidemia: Secondary | ICD-10-CM | POA: Diagnosis not present

## 2011-11-06 ENCOUNTER — Telehealth: Payer: Self-pay | Admitting: *Deleted

## 2011-11-06 NOTE — Telephone Encounter (Signed)
I spoke to Mrs. Alexander Burton. He has been having night sweats for the last 3-4 weeks with subjective fevers. His temperature has been in the 99-100 range. He has also had anorexia and believes he has lost some weight.  I asked that he come in tomorrow morning and would like him scheduled for a visit at 10 AM.

## 2011-11-06 NOTE — Telephone Encounter (Signed)
Message left, specifics of appt given.

## 2011-11-06 NOTE — Telephone Encounter (Signed)
Patient scheduled appointment for tomorrow at 10:00 AM

## 2011-11-06 NOTE — Telephone Encounter (Signed)
Patient's wife called stating he is running a low grade fever of 99-100 for the past couple of days.  He is also having night sweats.  Last seen 05/2011, and was told to call if any fevers within 2 weeks.  Please advise. Wendall Mola CMA

## 2011-11-07 ENCOUNTER — Encounter: Payer: Self-pay | Admitting: Internal Medicine

## 2011-11-07 ENCOUNTER — Ambulatory Visit (INDEPENDENT_AMBULATORY_CARE_PROVIDER_SITE_OTHER): Payer: Medicare Other | Admitting: Internal Medicine

## 2011-11-07 VITALS — BP 133/85 | HR 58 | Temp 97.8°F | Ht 71.0 in | Wt 192.8 lb

## 2011-11-07 DIAGNOSIS — R61 Generalized hyperhidrosis: Secondary | ICD-10-CM | POA: Diagnosis not present

## 2011-11-07 LAB — COMPREHENSIVE METABOLIC PANEL
Albumin: 3.7 g/dL (ref 3.5–5.2)
Alkaline Phosphatase: 195 U/L — ABNORMAL HIGH (ref 39–117)
CO2: 27 mEq/L (ref 19–32)
Calcium: 8.9 mg/dL (ref 8.4–10.5)
Chloride: 107 mEq/L (ref 96–112)
Glucose, Bld: 88 mg/dL (ref 70–99)
Potassium: 4.5 mEq/L (ref 3.5–5.3)
Sodium: 142 mEq/L (ref 135–145)
Total Protein: 6 g/dL (ref 6.0–8.3)

## 2011-11-07 LAB — CBC
Hemoglobin: 14.2 g/dL (ref 13.0–17.0)
RBC: 4.81 MIL/uL (ref 4.22–5.81)
WBC: 6 10*3/uL (ref 4.0–10.5)

## 2011-11-07 LAB — URINALYSIS, ROUTINE W REFLEX MICROSCOPIC
Bilirubin Urine: NEGATIVE
Protein, ur: NEGATIVE mg/dL
Urobilinogen, UA: 0.2 mg/dL (ref 0.0–1.0)

## 2011-11-07 NOTE — Progress Notes (Signed)
Patient ID: Alexander Burton, male   DOB: Sep 24, 1940, 71 y.o.   MRN: [id number]     [facility] for Infectious Disease  Patient Active Problem List  Diagnosis  . Hypertension  . Liver abscess  . CAD S/P percutaneous coronary angioplasty  . Prostate enlargement  . Bony sclerosis  . Sleep apnea  . Night sweats    Patient's Medications  New Prescriptions   No medications on file  Previous Medications   CYANOCOBALAMIN (VITAMIN B-12 PO)    Take 1 tablet by mouth daily.   ESOMEPRAZOLE (NEXIUM) 40 MG CAPSULE    Take 40 mg by mouth daily before breakfast.     FINASTERIDE (PROSCAR) 5 MG TABLET    Take 5 mg by mouth daily.     METOPROLOL SUCCINATE (TOPROL-XL) 25 MG 24 HR TABLET    Take 25 mg by mouth daily.     MULTIPLE VITAMIN (MULITIVITAMIN WITH MINERALS) TABS    Take 1 tablet by mouth daily.   NITROGLYCERIN (NITROSTAT) 0.4 MG SL TABLET    Place 0.4 mg under the tongue every 5 (five) minutes as needed. For chest pains   PRAVASTATIN (PRAVACHOL) 20 MG TABLET    Take 20 mg by mouth daily.    Modified Medications   No medications on file  Discontinued Medications   No medications on file    Subjective: Alexander Burton is seen because he started feeling badly about one month ago. I saw him last October for fever of unknown origin and he was found to have a polymicrobial liver abscess.  That appeared to have resolved after percutaneous drainage and a course of anti-biotic therapy. He was doing well as of his last visit with me in January. He resumed all normal activities but started to feel worse about one month ago. He states that he suddenly became quite fatigued and could not do his usual activities. He also began having drenching night sweats and low-grade fevers up to 99.5. He also has developed some anorexia and believes he has probably lost a few pounds. About one week ago he started having some mild dysuria and noted some blood in his urine on a few occasions. He saw his cardiologist, Dr.  Herbie Baltimore, last week and mentioned his symptoms and was told that he needed to be seen soon.   He has baseline nocturia that has not changed. He also has some intermittent blurring of his vision over the past year that has not changed and he states that he was told his eye exam was normal recently.he denies any headache or other pains, sore throat, cough, shortness of breath, chest pain, nausea, vomiting, or diarrhea.  Objective: Temp: 97.8 F (36.6 C) (06/12 0932) Temp src: Oral (06/12 0932) BP: 133/85 mmHg (06/12 0932) Pulse Rate: 58  (06/12 0932)  General: his weight is down 5 pounds since January to 192. He is in no distress. Skin: he has a few small bruises on his forearms and well tanned skin. She no rash or splinter hemorrhages. No palpable adenopathy Eyes: No conjunctival hemorrhages Oral: Clear Lungs: clear Cor: regular S1 and S2 no murmurs Abdomen: soft and nontender. I cannot feel a liver, spleen or other masses. No CVA or suprapubic tenderness Joints and extremities: normal.  Assessment: I do not see any obvious cause for his recent night sweats, low-grade fevers and anorexia. I doubt they are all explained by his recent bladder symptoms. It would be unusual for him to have a relapse of his liver  abscess this long after completing antibiotic therapy but I would have a low threshold for repeating an abdominal CT scan if no cause of his symptoms are forthcoming. I will start with basic blood work, urine studies and blood cultures today.  Plan: 1. Observe off of antibiotics 2. CBC and complete metabolic panel 3. UA and urine culture 4. Blood cultures x2 5. Followup in one week   Cliffton Asters, MD [facility] for Infectious Disease [facility] [phone] pager   [phone] cell 11/07/2011, 10:27 AM

## 2011-11-08 LAB — CULTURE, URINE COMPREHENSIVE: Colony Count: NO GROWTH

## 2011-11-09 ENCOUNTER — Other Ambulatory Visit: Payer: Self-pay | Admitting: Internal Medicine

## 2011-11-09 ENCOUNTER — Telehealth: Payer: Self-pay | Admitting: Internal Medicine

## 2011-11-09 DIAGNOSIS — K750 Abscess of liver: Secondary | ICD-10-CM

## 2011-11-09 NOTE — Telephone Encounter (Signed)
He continues to have low-grade fevers, malaise and night sweats. His liver enzymes have gone up recently. His blood cultures are negative so far. I will repeat a contrasted CT scan of his abdomen to look for relapse of his liver abscess or other lesions. I spoke with Alexander Burton (cel [phone]) and her they agree with that plan.

## 2011-11-09 NOTE — Progress Notes (Signed)
Scheduled at [facility], [address]  Appt.  Monday, November 12, 2011 @ 1000.  Clear liquids only 4 hours prior.  Pt/wife notified.  Verbalized understanding.

## 2011-11-12 ENCOUNTER — Ambulatory Visit
Admission: RE | Admit: 2011-11-12 | Discharge: 2011-11-12 | Disposition: A | Discharge status: Home / Self Care | Payer: Medicare Other | Source: Ambulatory Visit | Attending: Internal Medicine | Admitting: Internal Medicine

## 2011-11-12 DIAGNOSIS — K750 Abscess of liver: Secondary | ICD-10-CM | POA: Diagnosis not present

## 2011-11-12 DIAGNOSIS — I719 Aortic aneurysm of unspecified site, without rupture: Secondary | ICD-10-CM | POA: Diagnosis not present

## 2011-11-12 DIAGNOSIS — M948X9 Other specified disorders of cartilage, unspecified sites: Secondary | ICD-10-CM | POA: Diagnosis not present

## 2011-11-12 MED ORDER — IOHEXOL 300 MG/ML  SOLN: 100.0000 mL | Freq: Once | INTRAMUSCULAR | Status: DC | PRN

## 2011-11-12 MED ORDER — IOHEXOL 300 MG/ML  SOLN
100.0000 mL | Freq: Once | INTRAMUSCULAR | Status: AC | PRN
  Administered 2011-11-12: 100 mL via INTRAVENOUS

## 2011-11-13 ENCOUNTER — Telehealth: Payer: Self-pay | Admitting: *Deleted

## 2011-11-13 NOTE — Telephone Encounter (Signed)
Reminded of appt for tomorrow.

## 2011-11-14 ENCOUNTER — Encounter: Payer: Self-pay | Admitting: Internal Medicine

## 2011-11-14 ENCOUNTER — Ambulatory Visit (INDEPENDENT_AMBULATORY_CARE_PROVIDER_SITE_OTHER): Payer: Medicare Other | Admitting: Internal Medicine

## 2011-11-14 VITALS — BP 114/70 | HR 61 | Temp 97.7°F | Ht 71.0 in | Wt 193.5 lb

## 2011-11-14 DIAGNOSIS — R748 Abnormal levels of other serum enzymes: Secondary | ICD-10-CM

## 2011-11-14 DIAGNOSIS — R7402 Elevation of levels of lactic acid dehydrogenase (LDH): Secondary | ICD-10-CM

## 2011-11-14 LAB — COMPREHENSIVE METABOLIC PANEL
Albumin: 3.6 g/dL (ref 3.5–5.2)
CO2: 27 mEq/L (ref 19–32)
Glucose, Bld: 99 mg/dL (ref 70–99)
Potassium: 4 mEq/L (ref 3.5–5.3)
Sodium: 140 mEq/L (ref 135–145)
Total Bilirubin: 1.6 mg/dL — ABNORMAL HIGH (ref 0.3–1.2)
Total Protein: 5.8 g/dL — ABNORMAL LOW (ref 6.0–8.3)

## 2011-11-14 NOTE — Progress Notes (Signed)
Patient ID: Alexander Burton, male   DOB: [birth year], 71 y.o.   MRN: [id number]    [facility] for Infectious Disease  Patient Active Problem List  Diagnosis  . Hypertension  . Liver abscess  . CAD S/P percutaneous coronary angioplasty  . Prostate enlargement  . Bony sclerosis  . Sleep apnea  . Night sweats  . Elevated liver enzymes    Patient's Medications  New Prescriptions   No medications on file  Previous Medications   ASPIRIN 81 MG TABLET    Take 81 mg by mouth daily.   CYANOCOBALAMIN (VITAMIN B-12 PO)    Take 1 tablet by mouth daily.   ESOMEPRAZOLE (NEXIUM) 40 MG CAPSULE    Take 40 mg by mouth daily before breakfast.     FINASTERIDE (PROSCAR) 5 MG TABLET    Take 5 mg by mouth daily.     METOPROLOL SUCCINATE (TOPROL-XL) 25 MG 24 HR TABLET    Take 25 mg by mouth daily.     MULTIPLE VITAMIN (MULITIVITAMIN WITH MINERALS) TABS    Take 1 tablet by mouth daily.   NITROGLYCERIN (NITROSTAT) 0.4 MG SL TABLET    Place 0.4 mg under the tongue every 5 (five) minutes as needed. For chest pains   PRAVASTATIN (PRAVACHOL) 20 MG TABLET    Take 20 mg by mouth daily.    Modified Medications   No medications on file  Discontinued Medications   No medications on file    Subjective: Alexander Burton is in for his followup visit today. There have been a few occasions in the past week when he has felt slightly warm but his highest recorded temperature has been 98.8. He has not had any further night sweats but he continues to feel quite fatigued. He has had a few bouts of nausea without vomiting. He states that he occasionally has very mild dysuria but has not noted any hematuria. He states his appetite is not as good as it normally is.  Objective: Temp: 97.7 F (36.5 C) (06/19 1434) Temp src: Oral (06/19 1434) BP: 114/70 mmHg (06/19 1434) Pulse Rate: 61  (06/19 1434)  General: he is in no distress. His usual dry wit is intact. Skin: eyes no change in the tanned, skin over his hands and forearms. He  has a bruise on his left forearm. He has no acute rash or palpable adenopathy Lungs: clear Cor: regular S1 and S2 and no murmurs Abdomen: soft and nontender. I cannot feel a liver, spleen or other masses   Lab Results Lab Results  Component Value Date   WBC 6.0 11/07/2011   HGB 14.2 11/07/2011   HCT 42.2 11/07/2011   MCV 87.7 11/07/2011   PLT 207 11/07/2011    Lab Results  Component Value Date   CREATININE 0.88 11/07/2011   BUN 16 11/07/2011   NA 142 11/07/2011   K 4.5 11/07/2011   CL 107 11/07/2011   CO2 27 11/07/2011    Lab Results  Component Value Date   ALT 106* 11/07/2011   AST 78* 11/07/2011   ALKPHOS 195* 11/07/2011   BILITOT 1.1 11/07/2011    Blood cultures: No growth  CT ABDOMEN WITH CONTRAST   IMPRESSION:  1. No residual or recurrent liver abscess.  2. Prominent cystic duct remnant.  3. Stable small aortic aneurysm.  4. Sclerotic bone lesions, question history of prostate cancer.  5. Stable atherosclerotic changes involving the aorta and small  aortic aneurysm.  Original Report Authenticated By: P. Loralie Champagne, M.D.  Assessment: There is no evidence of relapse of his liver abscess but his liver enzymes have risen coincident with feeling worse over the last 5 weeks. I see no apparent cause for his elevated liver enzymes but will repeat them today and check viral serologies.  Plan: 1. Repeat liver enzymes 2. Check acute hepatitis panel and CMV IgM   Cliffton Asters, MD [facility] for Infectious Disease [facility] [phone] pager   [phone] cell 11/14/2011, 3:00 PM

## 2011-11-15 LAB — HEPATITIS PANEL, ACUTE: Hepatitis B Surface Ag: NEGATIVE

## 2011-11-16 LAB — CMV IGM: CMV IgM: 0.2 (ref <0.90)

## 2011-11-20 ENCOUNTER — Telehealth: Payer: Self-pay | Admitting: Internal Medicine

## 2011-11-20 NOTE — Telephone Encounter (Signed)
I spoke with Alexander Burton today. He has not had any further fever since his visit with me last week. However he has developed an upper respiratory infection and feels fairly miserable with that. I informed him that his liver enzymes continue to remain elevated but the other testing did not reveal any acute problems. I suggested that we wait and see how he is feeling once his upper respiratory infection resolves. He will need followup liver enzyme testing. He is due to see his primary care physician, Dr. Dwana Melena, on June 28 and has a followup visit with me next month.

## 2011-11-23 DIAGNOSIS — R7402 Elevation of levels of lactic acid dehydrogenase (LDH): Secondary | ICD-10-CM | POA: Diagnosis not present

## 2011-11-23 DIAGNOSIS — R7401 Elevation of levels of liver transaminase levels: Secondary | ICD-10-CM | POA: Diagnosis not present

## 2011-11-23 DIAGNOSIS — E785 Hyperlipidemia, unspecified: Secondary | ICD-10-CM | POA: Diagnosis not present

## 2011-11-23 DIAGNOSIS — R5381 Other malaise: Secondary | ICD-10-CM | POA: Diagnosis not present

## 2011-11-23 DIAGNOSIS — E782 Mixed hyperlipidemia: Secondary | ICD-10-CM | POA: Diagnosis not present

## 2011-11-23 DIAGNOSIS — R5383 Other fatigue: Secondary | ICD-10-CM | POA: Diagnosis not present

## 2011-11-23 DIAGNOSIS — E559 Vitamin D deficiency, unspecified: Secondary | ICD-10-CM | POA: Diagnosis not present

## 2011-11-23 DIAGNOSIS — Z79899 Other long term (current) drug therapy: Secondary | ICD-10-CM | POA: Diagnosis not present

## 2011-11-23 DIAGNOSIS — I10 Essential (primary) hypertension: Secondary | ICD-10-CM | POA: Diagnosis not present

## 2011-12-05 ENCOUNTER — Other Ambulatory Visit: Payer: Self-pay | Admitting: Internal Medicine

## 2011-12-05 ENCOUNTER — Telehealth: Payer: Self-pay | Admitting: *Deleted

## 2011-12-05 DIAGNOSIS — R748 Abnormal levels of other serum enzymes: Secondary | ICD-10-CM

## 2011-12-05 DIAGNOSIS — R197 Diarrhea, unspecified: Secondary | ICD-10-CM | POA: Insufficient documentation

## 2011-12-05 NOTE — Telephone Encounter (Signed)
Wife reports diarrhea, weak, anorexia, nausea, sweats, fatigue but no fever x 1 wk.  No vomiting.  Full PE by PCP Dr Margo Aye last week.  Very little food/fluid intake.  Next available MD clinic appt 12/17/11.  Please advise.

## 2011-12-05 NOTE — Telephone Encounter (Signed)
Please ask [name] to come in for repeat lab work and so we can obtain stool specimens for culture, Giardia and C. Difficile PCR. I will call him once the results are available.

## 2011-12-05 NOTE — Telephone Encounter (Signed)
Coming in 12/06/11 in the AM.  Bringing stool specimen.

## 2011-12-06 ENCOUNTER — Other Ambulatory Visit: Payer: Self-pay | Admitting: Internal Medicine

## 2011-12-06 ENCOUNTER — Other Ambulatory Visit: Payer: Medicare Other

## 2011-12-06 DIAGNOSIS — R748 Abnormal levels of other serum enzymes: Secondary | ICD-10-CM

## 2011-12-06 DIAGNOSIS — R197 Diarrhea, unspecified: Secondary | ICD-10-CM

## 2011-12-06 LAB — COMPREHENSIVE METABOLIC PANEL
BUN: 19 mg/dL (ref 6–23)
CO2: 28 mEq/L (ref 19–32)
Creat: 0.93 mg/dL (ref 0.50–1.35)
Glucose, Bld: 98 mg/dL (ref 70–99)
Total Bilirubin: 2.5 mg/dL — ABNORMAL HIGH (ref 0.3–1.2)

## 2011-12-07 ENCOUNTER — Other Ambulatory Visit: Payer: Medicare Other

## 2011-12-07 DIAGNOSIS — R197 Diarrhea, unspecified: Secondary | ICD-10-CM | POA: Diagnosis not present

## 2011-12-08 LAB — CBC
HCT: 45.1 % (ref 39.0–52.0)
Hemoglobin: 13.4 g/dL (ref 13.0–17.0)
MCH: 29.6 pg (ref 26.0–34.0)
MCV: 99.8 fL (ref 78.0–100.0)
RBC: 4.52 MIL/uL (ref 4.22–5.81)

## 2011-12-10 DIAGNOSIS — R300 Dysuria: Secondary | ICD-10-CM | POA: Diagnosis not present

## 2011-12-10 DIAGNOSIS — N529 Male erectile dysfunction, unspecified: Secondary | ICD-10-CM | POA: Diagnosis not present

## 2011-12-10 DIAGNOSIS — R972 Elevated prostate specific antigen [PSA]: Secondary | ICD-10-CM | POA: Diagnosis not present

## 2011-12-13 ENCOUNTER — Telehealth: Payer: Self-pay | Admitting: Licensed Clinical Social Worker

## 2011-12-13 NOTE — Telephone Encounter (Signed)
Patient's wife called wanting lab results from last weeks visit, the patient is still having night sweats.

## 2011-12-14 ENCOUNTER — Other Ambulatory Visit: Payer: Self-pay | Admitting: *Deleted

## 2011-12-14 ENCOUNTER — Other Ambulatory Visit: Payer: Self-pay | Admitting: Internal Medicine

## 2011-12-14 DIAGNOSIS — K750 Abscess of liver: Secondary | ICD-10-CM | POA: Diagnosis not present

## 2011-12-14 DIAGNOSIS — R61 Generalized hyperhidrosis: Secondary | ICD-10-CM

## 2011-12-14 LAB — COMPREHENSIVE METABOLIC PANEL
ALT: 90 U/L — ABNORMAL HIGH (ref 0–53)
AST: 99 U/L — ABNORMAL HIGH (ref 0–37)
Albumin: 3.7 g/dL (ref 3.5–5.2)
Alkaline Phosphatase: 597 U/L — ABNORMAL HIGH (ref 39–117)
BUN: 20 mg/dL (ref 6–23)
Creat: 0.94 mg/dL (ref 0.50–1.35)
Potassium: 4.3 mEq/L (ref 3.5–5.3)

## 2011-12-14 LAB — CBC WITH DIFFERENTIAL/PLATELET
Basophils Absolute: 0 10*3/uL (ref 0.0–0.1)
Basophils Relative: 0 % (ref 0–1)
HCT: 39.8 % (ref 39.0–52.0)
MCHC: 33.9 g/dL (ref 30.0–36.0)
Monocytes Absolute: 0.5 10*3/uL (ref 0.1–1.0)
Neutro Abs: 7.5 10*3/uL (ref 1.7–7.7)
Platelets: 318 10*3/uL (ref 150–400)
RDW: 13.5 % (ref 11.5–15.5)

## 2011-12-14 NOTE — Telephone Encounter (Signed)
Alexander Burton continues to be bothered by chills and night sweats. He has been taking his temperature but the highest recorded temperature is 98.8 recently. He still has anorexia and weakness. He saw his urologist, Dr. Darvin Neighbours, 4 days ago and had a PSA and urinalysis done. He told Dr. Earlene Plater and he been having some blood in his urine. He has not been on any antibiotic recently. His liver enzymes continue to climb. He has C. difficile PCR was negative. I will have him come in for repeat blood work including repeat blood cultures today.

## 2011-12-17 ENCOUNTER — Other Ambulatory Visit: Payer: Medicare Other | Admitting: Lab

## 2011-12-17 ENCOUNTER — Encounter (HOSPITAL_COMMUNITY): Payer: Self-pay

## 2011-12-17 ENCOUNTER — Other Ambulatory Visit: Payer: Medicare Other

## 2011-12-17 ENCOUNTER — Encounter (HOSPITAL_COMMUNITY)
Admission: RE | Admit: 2011-12-17 | Discharge: 2011-12-17 | Disposition: A | Discharge status: Home / Self Care | Payer: Medicare Other | Source: Ambulatory Visit | Attending: Oncology | Admitting: Oncology

## 2011-12-17 DIAGNOSIS — K750 Abscess of liver: Secondary | ICD-10-CM | POA: Diagnosis not present

## 2011-12-17 DIAGNOSIS — C439 Malignant melanoma of skin, unspecified: Secondary | ICD-10-CM | POA: Diagnosis not present

## 2011-12-17 DIAGNOSIS — I10 Essential (primary) hypertension: Secondary | ICD-10-CM | POA: Insufficient documentation

## 2011-12-17 MED ORDER — TECHNETIUM TC 99M MEDRONATE IV KIT
23.7000 | PACK | Freq: Once | INTRAVENOUS | Status: AC | PRN
Start: 2011-12-17 — End: 2011-12-17
  Administered 2011-12-17: 23.7 via INTRAVENOUS

## 2011-12-18 ENCOUNTER — Other Ambulatory Visit: Payer: Self-pay | Admitting: Internal Medicine

## 2011-12-18 ENCOUNTER — Telehealth: Payer: Self-pay | Admitting: Licensed Clinical Social Worker

## 2011-12-18 DIAGNOSIS — R748 Abnormal levels of other serum enzymes: Secondary | ICD-10-CM

## 2011-12-18 NOTE — Telephone Encounter (Signed)
His blood cultures are negative but his alkaline phosphatase continues to climb and is now over 500. He had one fever or last week shortly after having his blood drawn on July 19. His temperature was 101.9 associated with shaking chills. He is feeling better today. I've asked the lab to obtain a GGT to help determine if the alkaline phosphatase is of hepatic origin. If it is elevated all I will get a right upper quadrant ultrasound. He is due to see me July 30.

## 2011-12-18 NOTE — Telephone Encounter (Signed)
Patient called inquiring about results, I gave her the results of the blood culture and explained it was preliminary. She was concerned about her husband not feeling any better and wanted to talk to Dr. Orvan Falconer. She would like a call on her cell at [phone]

## 2011-12-19 LAB — GAMMA GT: GGT: 239 U/L — ABNORMAL HIGH (ref 7–51)

## 2011-12-20 ENCOUNTER — Telehealth: Payer: Self-pay | Admitting: *Deleted

## 2011-12-20 DIAGNOSIS — R972 Elevated prostate specific antigen [PSA]: Secondary | ICD-10-CM | POA: Diagnosis not present

## 2011-12-20 DIAGNOSIS — K750 Abscess of liver: Secondary | ICD-10-CM

## 2011-12-20 DIAGNOSIS — R310 Gross hematuria: Secondary | ICD-10-CM | POA: Diagnosis not present

## 2011-12-20 DIAGNOSIS — R748 Abnormal levels of other serum enzymes: Secondary | ICD-10-CM

## 2011-12-20 NOTE — Telephone Encounter (Signed)
Pt scheduled at [facility], [address], [address], for Tues., July 30 @ 0930 for RUQ U/S.  NPO after midnight.  Pt will be finished by 1015 in time for appt w/ Dr. Orvan Falconer at [phone].  RN spoke with pt's wife.  Shared appt information and NPO status.  Wife verbalized back these instructions.

## 2011-12-21 ENCOUNTER — Ambulatory Visit (HOSPITAL_BASED_OUTPATIENT_CLINIC_OR_DEPARTMENT_OTHER): Payer: Medicare Other | Admitting: Oncology

## 2011-12-21 ENCOUNTER — Telehealth: Payer: Self-pay | Admitting: Oncology

## 2011-12-21 VITALS — BP 126/79 | HR 59 | Temp 97.0°F | Ht 71.0 in | Wt 186.5 lb

## 2011-12-21 DIAGNOSIS — R5381 Other malaise: Secondary | ICD-10-CM | POA: Diagnosis not present

## 2011-12-21 DIAGNOSIS — B338 Other specified viral diseases: Secondary | ICD-10-CM | POA: Diagnosis not present

## 2011-12-21 DIAGNOSIS — M899 Disorder of bone, unspecified: Secondary | ICD-10-CM

## 2011-12-21 DIAGNOSIS — K750 Abscess of liver: Secondary | ICD-10-CM

## 2011-12-21 DIAGNOSIS — M949 Disorder of cartilage, unspecified: Secondary | ICD-10-CM | POA: Diagnosis not present

## 2011-12-21 DIAGNOSIS — R5383 Other fatigue: Secondary | ICD-10-CM

## 2011-12-21 NOTE — Progress Notes (Signed)
Hematology and Oncology Follow Up Visit  Alexander Burton [id number] [birth year] 71 y.o. 12/21/2011 11:05 AM   Principle Diagnosis: 24 -year-old with a history of prostatic enlargement and possible sclerotic bony lesions rule out metastatic malignancy vs. Benign etiology.    Interim History:  Alexander Burton who I saw as an inpatient consult during his recent hospitalization in the end of October of 2012. At that time he was found to have a hepatic abscess and underwent CT-guided drainage of abscess as well as required intravenous antibiotics. During that hospitalization he underwent a CT scan of the abdomen and pelvis which showed a sclerotic bony lesions both in the ribs as well as the spine. Patient has a known history of benign prostatic as well as elevated PSA in the past however he does not have a history of prostate cancer. Patient had been followed by Dr. Gaynelle Arabian and have had a mildly elevated PSA for years. He had had a prosthetic biopsy in the past that have been benign.  Since his last visit, Alexander Burton clinically is feeling well is not reporting any abdominal pain is not reporting any fevers or chills is not reporting any increase in his lower back pain he does not have any neurological symptoms does not have any paresthesias or anesthesia is. He did not have any change in his bowel habits overall he is regaining most activities of daily living. He does reports night sweats at times as well as some weight loss. His LFT are elevated and getting U/S done and following up with Dr. Orvan Falconer from [place].  Medications: I have reviewed the patient's current medications.  Allergies: No Known Allergies   Review of Systems: Constitutional:  Negative for fever, chills, night sweats, anorexia, weight loss, pain. Cardiovascular: no chest pain or dyspnea on exertion Respiratory: no cough, shortness of breath, or wheezing Neurological: negative Dermatological: negative ENT:  negative Skin Gastrointestinal: no abdominal pain, change in bowel habits, or black or bloody stools Genito-Urinary: no dysuria, trouble voiding, or hematuria Hematological and Lymphatic: negative Breast: negative Musculoskeletal: negative Remaining ROS negative.  Physical Exam: Blood pressure 126/79, pulse Alexander, temperature 97 F (36.1 C), temperature source Oral, height 5\' 11"  (1.803 m), weight 186 lb 8 oz (84.596 kg). ECOG: 1 General appearance: alert and no distress Head: Normocephalic, without obvious abnormality, atraumatic Neck: no adenopathy, no carotid bruit, no JVD, supple, symmetrical, trachea midline and thyroid not enlarged, symmetric, no tenderness/mass/nodules Lymph nodes: Cervical, supraclavicular, and axillary nodes normal. Heart:regular rate and rhythm, no murmurs or gallops Lung:chest clear, no wheezing, rales, normal symmetric air entry, Heart exam - S1, S2 normal, no murmur, no gallop, rate regular Abdomin:normal EXT:no erythema, induration, or nodules Lab Results: Lab Results  Component Value Date   WBC 9.2 12/14/2011   HGB 13.5 12/14/2011   HCT 39.8 12/14/2011   MCV 87.5 12/14/2011   PLT 318 12/14/2011     Chemistry      Component Value Date/Time   NA 142 12/14/2011 1546   K 4.3 12/14/2011 1546   CL 106 12/14/2011 1546   CO2 29 12/14/2011 1546   BUN 20 12/14/2011 1546   CREATININE 0.94 12/14/2011 1546   CREATININE 0.89 08/23/2011 1139      Component Value Date/Time   CALCIUM 8.8 12/14/2011 1546   ALKPHOS 597* 12/14/2011 1546   AST 99* 12/14/2011 1546   ALT 90* 12/14/2011 1546   BILITOT 0.8 12/14/2011 1546     NUCLEAR MEDICINE WHOLE BODY BONE SCINTIGRAPHY  Technique: Whole  body anterior and posterior images were obtained  approximately 3 hours after intravenous injection of  radiopharmaceutical.  Radiopharmaceutical: 23.7MILLI CURIE TC-MDP TECHNETIUM TC 68M  MEDRONATE IV KIT  Comparison: 04/18/2011  Findings:  Focal areas of increased uptake identified at  the right right  parietal bone, lateral margin of the mid left scapula, and at the  medial margin of the left acetabulum, consistent with osseous  metastases.  These are unchanged since the previous exam.  No additional areas of abnormal increased or decreased tracer  localization identified. Tracer retention within urinary bladder  obscures portions of the anterior central pelvis.  Otherwise normal urinary tract and soft tissue distribution of  tracer.  IMPRESSION:  Stable foci of uptake at the left scapula, right parietal bone and  left pelvis, cannot exclude osseous metastases.  No new scintigraphic abnormalities identified.   Impression and Plan: Alexander Burton with the following issues:  1. Questionable sclerotic bony lesions in the spine that could be related to malignancy versus benign bone findings. I have personally reviewed these imaging studies with radiology and as mentioned the differential diagnosis includes possible benign causes however metastatic malignancy could not be completely ruled out. His follow up Bone scan on 12/17/2011 is unchanged makes it less likely a cancer diagnosis. I will continue to follow him for that.  2. Hepatic abscess that has resolved. Still have elevated LFTs and being followed by Dr. Orvan Falconer.  3. Weakness/fever/ sweats. Unclear etiology, There is always a chance there is an occult cancer but it is not very obvious at this time.  I will continue to follow him for that.     [facility], MD 7/26/201311:05 AM

## 2011-12-21 NOTE — Telephone Encounter (Signed)
Gave pt appt calendar for September 2013 lab and MD °

## 2011-12-25 ENCOUNTER — Encounter: Payer: Self-pay | Admitting: Internal Medicine

## 2011-12-25 ENCOUNTER — Ambulatory Visit (INDEPENDENT_AMBULATORY_CARE_PROVIDER_SITE_OTHER): Payer: Medicare Other | Admitting: Internal Medicine

## 2011-12-25 ENCOUNTER — Ambulatory Visit
Admission: RE | Admit: 2011-12-25 | Discharge: 2011-12-25 | Disposition: A | Discharge status: Home / Self Care | Payer: Medicare Other | Source: Ambulatory Visit | Attending: Internal Medicine | Admitting: Internal Medicine

## 2011-12-25 VITALS — BP 137/80 | HR 63 | Temp 97.7°F | Ht 71.0 in | Wt 184.8 lb

## 2011-12-25 DIAGNOSIS — K750 Abscess of liver: Secondary | ICD-10-CM

## 2011-12-25 DIAGNOSIS — R748 Abnormal levels of other serum enzymes: Secondary | ICD-10-CM | POA: Diagnosis not present

## 2011-12-25 DIAGNOSIS — R634 Abnormal weight loss: Secondary | ICD-10-CM | POA: Diagnosis not present

## 2011-12-25 DIAGNOSIS — Z9089 Acquired absence of other organs: Secondary | ICD-10-CM | POA: Diagnosis not present

## 2011-12-25 NOTE — Progress Notes (Signed)
Patient ID: Alexander Burton, male   DOB: [birth year], 71 y.o.   MRN: [id number]    [facility] for Infectious Disease  Patient Active Problem List  Diagnosis  . Hypertension  . Liver abscess  . CAD S/P percutaneous coronary angioplasty  . Prostate enlargement  . Bony sclerosis  . Sleep apnea  . Night sweats  . Elevated liver enzymes  . Diarrhea  . Weight loss, unintentional    Patient's Medications  New Prescriptions   No medications on file  Previous Medications   ASPIRIN 81 MG TABLET    Take 81 mg by mouth daily.   CYANOCOBALAMIN (VITAMIN B-12 PO)    Take 1 tablet by mouth daily.   ESOMEPRAZOLE (NEXIUM) 40 MG CAPSULE    Take 40 mg by mouth daily before breakfast.     FINASTERIDE (PROSCAR) 5 MG TABLET    Take 5 mg by mouth daily.     METOPROLOL SUCCINATE (TOPROL-XL) 25 MG 24 HR TABLET    Take 25 mg by mouth daily.     MULTIPLE VITAMIN (MULITIVITAMIN WITH MINERALS) TABS    Take 1 tablet by mouth daily.   NITROGLYCERIN (NITROSTAT) 0.4 MG SL TABLET    Place 0.4 mg under the tongue every 5 (five) minutes as needed. For chest pains   PRAVASTATIN (PRAVACHOL) 20 MG TABLET    Take 20 mg by mouth daily.    Modified Medications   No medications on file  Discontinued Medications   No medications on file    Subjective: Alexander Burton is in for his routine visit. He continues to have extreme fatigue and malaise. His appetite is poor and he continues to lose weight. He has not had any recent fevers that he is aware of but states that he does have mild night sweats at night. He has not had any pain, nausea, vomiting, diarrhea, or dysuria. He occasionally has some mild dry cough but no shortness of breath. 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  Objective: Temp: 97.7 F (36.5 C) (07/30 1026) Temp src: Oral (07/30 1026) BP: 137/80 mmHg (07/30 1026) Pulse Rate: 63  (07/30  1026)  General: He looks worried and frustrated Skin: Scattered bruises but no acute rash Lungs: Clear Cor: Regular S1 and S2 and no murmur Abdomen: Soft nontender  Lab Results Lab Results  Component Value Date   WBC 9.2 12/14/2011   HGB 13.5 12/14/2011   HCT 39.8 12/14/2011   MCV 87.5 12/14/2011   PLT 318 12/14/2011    Lab Results  Component Value Date   CREATININE 0.94 12/14/2011   BUN 20 12/14/2011   NA 142 12/14/2011   K 4.3 12/14/2011   CL 106 12/14/2011   CO2 29 12/14/2011    Lab Results  Component Value Date   ALT 90* 12/14/2011   AST 99* 12/14/2011   ALKPHOS 597* 12/14/2011   BILITOT 0.8 12/14/2011      Assessment: So far there is no clear explanation for his malaise, liver enzymes with predominant elevation about alkaline phosphatase and weight loss. His CT scan did not show any recurrence of his liver abscess or other new lesions and an ultrasound today showed no acute abnormalities. He has had a prior cholecystectomy. Blood cultures done in June and again in early July are negative.  Plan: GI referral   Cliffton Asters, MD  [facility] [phone] pager   [phone] cell 12/25/2011, 10:50 AM

## 2011-12-25 NOTE — Progress Notes (Signed)
Pt has an appointment with Dr. Samuel Jester in [place], Monday, December 31, 2011 @ 1530.  Pt notified.

## 2011-12-31 ENCOUNTER — Other Ambulatory Visit (INDEPENDENT_AMBULATORY_CARE_PROVIDER_SITE_OTHER): Payer: Self-pay | Admitting: Internal Medicine

## 2011-12-31 ENCOUNTER — Ambulatory Visit (INDEPENDENT_AMBULATORY_CARE_PROVIDER_SITE_OTHER): Payer: Medicare Other | Admitting: Internal Medicine

## 2011-12-31 ENCOUNTER — Encounter (INDEPENDENT_AMBULATORY_CARE_PROVIDER_SITE_OTHER): Payer: Self-pay | Admitting: Internal Medicine

## 2011-12-31 VITALS — BP 110/70 | HR 68 | Temp 97.5°F | Resp 20 | Ht 71.0 in | Wt 187.7 lb

## 2011-12-31 DIAGNOSIS — R509 Fever, unspecified: Secondary | ICD-10-CM

## 2011-12-31 DIAGNOSIS — R748 Abnormal levels of other serum enzymes: Secondary | ICD-10-CM

## 2011-12-31 NOTE — Patient Instructions (Signed)
Discontinue pravastatin for now. Physician will contact you with results of blood work.

## 2011-12-31 NOTE — Progress Notes (Signed)
Presenting complaint;  Elevated transaminases with markedly elevated serum alkaline phosphatase. Intermittent fever and chills. History of present illness;  Alexander Burton is 71 year old Caucasian male who is referred through courtesy of Dr. Cliffton Asters for evaluation of elevated transaminases and rapidly rising alkaline phosphatase. Patient was admitted to [facility] in October 2012 and fever chills and weakness and found to have liver abscess. He was treated with antibiotics and percutaneous drainage. Cultures grew Escherichia coli and microaerophilic streptococci. During this acute illness agents transaminases remained normal he had mildly elevated bilirubin.  Patient did fine until 10-12 weeks ago when he began to experience similar symptoms that he had in October 2012 when he was diagnosed with hepatic abscess. On initial presentation in June 2013 his AST was 78, ALT 106 and AP was 195. He had abdominopelvic CT on 11/12/2011 and there was no evidence of recurrent liver abscess. His LFTs are repeated and alkaline phosphatase has been rising with fluctuating but stable transaminases. On 12/25/2011 underwent hepatobiliary ultrasound and no acute abnormality was noted. CBD measured 4 mm. Evidence of prior cholecystectomy. Since the recurrent hepatic abscess was ruled out patient is therefore here for further evaluation. He is accompanied by his wife. His symptoms are episodic generally occurring once a week. He feels extremely tired, gums febrile and developed chills and diaphoreses along with frequent belching and nausea and vomiting. He also loses his appetite for couple of days. He has lost a few pounds. He denies hematemesis diarrhea melena or rectal bleeding. He has noted intermittent hematuria usually after he's been riding his tractor. Serology is Dr. Earlene Plater feels it secondary to enlarged prostate. Review of the systems is positive for pain in his knee and shoulder joints. He denies pruritus,  abdominal pain or skin rash. He recalls he had 2 of his seat pulled few weeks prior to diagnosis of liver abscess. He is using Anaprox no more than 3-4 times a week. Patient also complains of intermittent solid food dysphagia and wants to have his esophagus dilated.   Current Medications: Current Outpatient Prescriptions  Medication Sig Dispense Refill  . aspirin 81 MG tablet Take 81 mg by mouth daily.      . Cyanocobalamin (VITAMIN B-12 PO) Take 1 tablet by mouth daily.      Marland Kitchen esomeprazole (NEXIUM) 40 MG capsule Take 40 mg by mouth daily before breakfast.        . finasteride (PROSCAR) 5 MG tablet Take 5 mg by mouth daily.        . metoprolol succinate (TOPROL-XL) 25 MG 24 hr tablet Take 25 mg by mouth daily.        . Multiple Vitamin (MULITIVITAMIN WITH MINERALS) TABS Take 1 tablet by mouth daily.      . naproxen sodium (ANAPROX) 220 MG tablet Take 220 mg by mouth as needed.      . nitroGLYCERIN (NITROSTAT) 0.4 MG SL tablet Place 0.4 mg under the tongue every 5 (five) minutes as needed. For chest pains      . pravastatin (PRAVACHOL) 20 MG tablet Take 20 mg by mouth daily.         Past medical history; Hypertension. Hyperlipidemia. Sleep apnea. He suffered bile duct injury at the time of laparoscopic cholecystectomy in 1993 and was transferred from [facility] to [facility] and injury repaired. He has not experienced any problems since then. Mediastinal lymph node biopsy In March 2001 no malignancy identified. Lymph node biopsy from neck in March 2003 and once again was negative.  Benign prostatic hypertrophy. Prostate biopsy negative for carcinoma. He is under care of Dr. Earlene Plater. Coronary artery disease. One vessel was stented in February 2010. History of colonic polyps removed on 3 prior colonoscopies. Most recent exam was in March 2013 with removal of 2 tubular adenomas. History of sclerotic bone lesions. He has been evaluated by oncologist and bone scan November 2012 and motor is Nedra Hai  in July 2013 and these lesions are stable. Malignant melanoma in situ removed from  right gluteal region in April 2006. He has small AAA less than 3 cm in diameter.  Allergies; NKA. Family history; Father died of emphysema at age 61. Mother had CAD and died at 51. He has one sister with surgery for colorectal carcinoma 2 years ago at age 8 and is doing fine. Social history; He is married and has 2 children in good health. 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 He smoked one pack of cigarettes per day for over 40 years and finally quit 5 years ago. He drinks alcohol socially no more than 2 drinks a month.  Objective: Blood pressure 110/70, pulse 68, temperature 97.5 F (36.4 C), temperature source Oral, resp. rate 20, height 5\' 11"  (1.803 m), weight 187 lb 11.2 oz (85.14 kg). Patient is alert and in no acute distress Conjunctiva is pink. Sclera is nonicteric Oropharyngeal mucosa is normal. No neck masses or thyromegaly noted. Cardiac exam with regular rhythm normal S1 and S2. No murmur or gallop noted. Lungs are clear to auscultation. Abdomen. He has prominent right subcostal scar. Bowel sounds are normal. Abdomen is soft and nontender without organomegaly or masses. Rectal examination deferred. Normal external genitalia. No LE edema or clubbing noted.  Labs/studies Results: Extensive lab data reviewed. AFP 1.6 on 03/27/2011. CBC with differential from 12/14/2011. WBC 9.2, H&H 13.5 and 39.8, platelet count 318K. diff pertinent for 81% neutrophils. LFTs from 12/14/2011; Total bilirubin 0.8, AP 597, AST 99, ALT 90 and serum albumin 3.7. Blood cultures from 12/14/2011 are negative. Recent ultrasound and CT reviewed along along with mixing studies from October and November 2012.      Assessment:  #1. [name] is 71 year old Caucasian male who presents with 10 to 12 week history of elevated transaminases with fluctuating pattern but rapidly rising alkaline phosphatase.  His alkaline phosphatase was 70 in March 2013, 195 on 11/07/2011 and 5 97 on 12/14/2011. Patient also has been experiencing spells of fever up to 102, chills diaphoreses fatigue loss of appetite nausea vomiting and belching. He generally recovers within 2-3 days. He was treated for liver abscess last fall. No abnormality noted to hepatic parenchyma on CT of 11/12/2011 an ultrasound of 12/20/2011. Patient's symptom complex and cholestatic pattern suggests autoimmune etiology such as autoimmune cholangitis, sarcoidosis as well as PBC although acute symptoms like his would be unusual. Given history of bile duct injury, I am also concerned that he has recurrent cholangitis but I do not see any evidence of dilated intrahepatic biliary radicals. I believe this is the most likely diagnosis given history of liver abscess  In October 2012. #2. Solid food dysphagia to be addressed when acute illness has resolved.    Recommendations; Will review imaging studies with Dr. Tyron Russell or associates. He would go to the lab for the following studies; LFTs, sedimentation rate, ANA, AMA, SMA,  ACE level as well as hepatitis B surface antigen and hepatitis C virus antibody. Will ask lab to freeze some serum in case further studies are needed. Patient advised to hold off  pravastatin until his condition has  been sorted out.  Will request records from [facility] regarding surgery for bile duct injury in 1993. Patient go to the lab for another set of blood cultures blood cultures x2 if he has temp greater than 101F.  Further recommendations will be made after lab studies completed and reviewed.   Addendum; Patient's vital markers were negative in June 2013; therefore [facility] lab called and these 2 tests cancelled.

## 2012-01-01 LAB — HEPATIC FUNCTION PANEL
ALT: 67 U/L — ABNORMAL HIGH (ref 0–53)
AST: 51 U/L — ABNORMAL HIGH (ref 0–37)
Alkaline Phosphatase: 523 U/L — ABNORMAL HIGH (ref 39–117)
Bilirubin, Direct: 0.2 mg/dL (ref 0.0–0.3)
Indirect Bilirubin: 0.5 mg/dL (ref 0.0–0.9)
Total Protein: 6.2 g/dL (ref 6.0–8.3)

## 2012-01-01 LAB — ANTI-NUCLEAR AB-TITER (ANA TITER): ANA Titer 1: 1:320 {titer} — ABNORMAL HIGH

## 2012-01-01 LAB — ANA: Anti Nuclear Antibody(ANA): POSITIVE — AB

## 2012-01-08 LAB — ANGIOTENSIN CONVERTING ENZYME: Angiotensin-Converting Enzyme: 42 U/L (ref 8–52)

## 2012-01-09 ENCOUNTER — Other Ambulatory Visit: Payer: Self-pay | Admitting: Radiology

## 2012-01-09 ENCOUNTER — Other Ambulatory Visit (INDEPENDENT_AMBULATORY_CARE_PROVIDER_SITE_OTHER): Payer: Self-pay | Admitting: Internal Medicine

## 2012-01-09 ENCOUNTER — Encounter (INDEPENDENT_AMBULATORY_CARE_PROVIDER_SITE_OTHER): Payer: Self-pay

## 2012-01-09 DIAGNOSIS — R748 Abnormal levels of other serum enzymes: Secondary | ICD-10-CM

## 2012-01-10 LAB — ANTI-SMOOTH MUSCLE ANTIBODY, IGG: Smooth Muscle Ab: 13 U (ref <20)

## 2012-01-11 ENCOUNTER — Encounter (HOSPITAL_COMMUNITY): Payer: Self-pay | Admitting: Pharmacy Technician

## 2012-01-14 ENCOUNTER — Ambulatory Visit (HOSPITAL_COMMUNITY)
Admission: RE | Admit: 2012-01-14 | Discharge: 2012-01-14 | Disposition: A | Discharge status: Home / Self Care | Payer: Medicare Other | Source: Ambulatory Visit | Attending: Internal Medicine | Admitting: Internal Medicine

## 2012-01-14 ENCOUNTER — Encounter (HOSPITAL_COMMUNITY): Payer: Self-pay

## 2012-01-14 DIAGNOSIS — R7989 Other specified abnormal findings of blood chemistry: Secondary | ICD-10-CM | POA: Insufficient documentation

## 2012-01-14 DIAGNOSIS — Z8546 Personal history of malignant neoplasm of prostate: Secondary | ICD-10-CM | POA: Diagnosis not present

## 2012-01-14 DIAGNOSIS — K8309 Other cholangitis: Secondary | ICD-10-CM | POA: Diagnosis not present

## 2012-01-14 DIAGNOSIS — R748 Abnormal levels of other serum enzymes: Secondary | ICD-10-CM

## 2012-01-14 LAB — CBC
Platelets: 122 10*3/uL — ABNORMAL LOW (ref 150–400)
RBC: 4.37 MIL/uL (ref 4.22–5.81)
WBC: 5.8 10*3/uL (ref 4.0–10.5)

## 2012-01-14 LAB — PROTIME-INR
INR: 1.13 (ref 0.00–1.49)
Prothrombin Time: 14.7 seconds (ref 11.6–15.2)

## 2012-01-14 MED ORDER — FENTANYL CITRATE 0.05 MG/ML IJ SOLN
INTRAMUSCULAR | Status: AC | PRN
  Administered 2012-01-14: 25 ug via INTRAVENOUS

## 2012-01-14 MED ORDER — SODIUM CHLORIDE 0.9 % IV SOLN: INTRAVENOUS | Status: DC

## 2012-01-14 MED ORDER — SODIUM CHLORIDE 0.9 % IV SOLN
Freq: Once | INTRAVENOUS | Status: AC
  Administered 2012-01-14: 1000 mL via INTRAVENOUS

## 2012-01-14 MED ORDER — HYDROCODONE-ACETAMINOPHEN 5-325 MG PO TABS: 1.0000 | ORAL_TABLET | ORAL | Status: DC | PRN

## 2012-01-14 MED ORDER — FENTANYL CITRATE 0.05 MG/ML IJ SOLN
INTRAMUSCULAR | Status: AC
  Filled 2012-01-14: qty 4

## 2012-01-14 MED ORDER — MIDAZOLAM HCL 2 MG/2ML IJ SOLN
INTRAMUSCULAR | Status: AC
  Filled 2012-01-14: qty 4

## 2012-01-14 MED ORDER — MIDAZOLAM HCL 5 MG/5ML IJ SOLN
INTRAMUSCULAR | Status: AC | PRN
  Administered 2012-01-14: 1 mg via INTRAVENOUS

## 2012-01-14 NOTE — Progress Notes (Signed)
1330 post liver biopsy and medication discharge instructions given  Both patient and spouse voice understanding.

## 2012-01-14 NOTE — Procedures (Signed)
US guided random core biopsy right hepatic lobe x3 via 18 gauge needle. Medications utilized- 1% lidocaine to skin and SQ tissues, versed 1 mg IV, fentanyl 25 mcg IV. Pathology pending. No immediate complications.

## 2012-01-14 NOTE — H&P (Signed)
Alexander Burton is an 71 y.o. male.   Chief Complaint: increased liver function tests x 3 months Hx liver abscess- no evidence abscess on CT 10/2011 Pt now scheduled for liver core biopsy HPI: afib; CAD; prostate ca; melanoma  Past Medical History  Diagnosis Date  . Atrial fibrillation   . CAD (coronary artery disease)   . Prostate ca   . BPH (benign prostatic hyperplasia)   . Gastritis   . Osteoarthritis   . Melanoma     sigmoid diverticulosis  . Goiter   . Colon polyps     Past Surgical History  Procedure Date  . Cholecystectomy   . Surgical resection, melanoma   . Colonoscopy 08/16/2011    Procedure: COLONOSCOPY;  Surgeon: Malissa Hippo, MD;  Location: [facility];  Service: Endoscopy;  Laterality: N/A;  1200    Family History  Problem Relation Age of Onset  . Cancer Sister     colon  . Colon cancer Neg Hx    Social History:  reports that he quit smoking about 4 years ago. His smoking use included Cigarettes. He has a 75 pack-year smoking history. He has never used smokeless tobacco. He reports that he drinks about .5 ounces of alcohol per week. He reports that he does not use illicit drugs.  Allergies: No Known Allergies   (Not in a hospital admission)  Results for orders placed during the hospital encounter of 01/14/12 (from the past 48 hour(s))  APTT     Status: Normal   Collection Time   01/14/12  8:34 AM      Component Value Range Comment   aPTT 30  24 - 37 seconds   CBC     Status: Abnormal   Collection Time   01/14/12  8:34 AM      Component Value Range Comment   WBC 5.8  4.0 - 10.5 K/uL    RBC 4.37  4.22 - 5.81 MIL/uL    Hemoglobin 13.0  13.0 - 17.0 g/dL    HCT 14.7 (*) 82.9 - 52.0 %    MCV 88.3  78.0 - 100.0 fL    MCH 29.7  26.0 - 34.0 pg    MCHC 33.7  30.0 - 36.0 g/dL    RDW 56.2  13.0 - 86.5 %    Platelets 122 (*) 150 - 400 K/uL   PROTIME-INR     Status: Normal   Collection Time   01/14/12  8:34 AM      Component Value Range Comment   Prothrombin Time 14.7  11.6 - 15.2 seconds    INR 1.13  0.00 - 1.49    No results found.  Review of Systems  Constitutional: Positive for weight loss. Negative for fever.  Respiratory: Negative for shortness of breath and wheezing.   Cardiovascular: Negative for chest pain.  Gastrointestinal: Positive for nausea and abdominal pain.    Blood pressure 120/72, pulse 60, temperature 97.1 F (36.2 C), temperature source Oral, resp. rate 18, height 5\' 11"  (1.803 m), weight 185 lb (83.915 kg), SpO2 97.00%. Physical Exam  Constitutional: He is oriented to person, place, and time. He appears well-developed and well-nourished.  Cardiovascular: Normal rate, regular rhythm and normal heart sounds.   No murmur heard. Respiratory: Effort normal and breath sounds normal. He has no wheezes.  GI: Soft. Bowel sounds are normal. There is no tenderness.  Musculoskeletal: Normal range of motion.  Neurological: He is alert and oriented to person, place, and time.  Skin: Skin is warm and dry.  Psychiatric: He has a normal mood and affect. His behavior is normal. Judgment and thought content normal.     Assessment/Plan Increasing liver fxns x 3 months Has hx of liver abscess - no evidence on CT 6/13 No fever; wbc normal Scheduled now for liver core bx Pt aware of procedure benefits and risks and agreeable to proceed Consent signed  Rashema Seawright A 01/14/2012, 9:25 AM

## 2012-01-15 ENCOUNTER — Other Ambulatory Visit (INDEPENDENT_AMBULATORY_CARE_PROVIDER_SITE_OTHER): Payer: Self-pay | Admitting: Internal Medicine

## 2012-01-15 DIAGNOSIS — K8309 Other cholangitis: Secondary | ICD-10-CM

## 2012-01-15 MED ORDER — METRONIDAZOLE 500 MG PO TABS: 500.0000 mg | ORAL_TABLET | Freq: Two times a day (BID) | ORAL | Status: AC

## 2012-01-15 MED ORDER — LEVOFLOXACIN 500 MG PO TABS: 500.0000 mg | ORAL_TABLET | Freq: Every day | ORAL | Status: AC

## 2012-01-17 ENCOUNTER — Encounter (INDEPENDENT_AMBULATORY_CARE_PROVIDER_SITE_OTHER): Payer: Self-pay | Admitting: Internal Medicine

## 2012-01-17 ENCOUNTER — Ambulatory Visit (INDEPENDENT_AMBULATORY_CARE_PROVIDER_SITE_OTHER): Payer: Medicare Other | Admitting: Internal Medicine

## 2012-01-17 VITALS — BP 110/68 | HR 76 | Temp 98.7°F | Resp 20 | Ht 71.0 in | Wt 185.5 lb

## 2012-01-17 DIAGNOSIS — K8309 Other cholangitis: Secondary | ICD-10-CM

## 2012-01-17 NOTE — Progress Notes (Signed)
Presenting complaint;  Followup for liver problems. Patient is here to review results of all studies and further management. Last visit 12/31/2011.  Subjective:  Patient is 71 year old Caucasian male who was last seen on 12/31/2011 for elevated transaminases with cholestatic pattern. He was treated for liver abscess in October 2012. This time he presented with elevated transaminases and recurrent spells of fever and chills for 12 weeks.He was evaluated by Dr.John Orvan Falconer. Blood cultures were negative. No biliary dilation noted on imaging studies. Past history significant for hepaticojejunostomy in 1993 for bile duct injury at the time of laparoscopic cholecystectomy. Patient's sedimentation rate was elevated at 49. ANA was positive at a titer of 1:320 with speckled pattern. SMA, ANA and ACE levels were negative or normal. He therefore underwent liver biopsy on 01/14/2012 revealing typical changes of acute cholangitis. I reviewed the results with patient's wife over 2 days ago and he was begun on Levaquin and metronidazole and this visit was arranged. He had fever one week ago. Resume he feels fine. He denies abdominal pain nausea vomiting. His weight is down by 2 pounds.   Current Medications: Current Outpatient Prescriptions  Medication Sig Dispense Refill  . aspirin 81 MG tablet Take 81 mg by mouth daily.      Marland Kitchen esomeprazole (NEXIUM) 40 MG capsule Take 40 mg by mouth daily before breakfast.        . finasteride (PROSCAR) 5 MG tablet Take 5 mg by mouth daily.        . hydroxypropyl methylcellulose (ISOPTO TEARS) 2.5 % ophthalmic solution Place 2 drops into both eyes 3 (three) times daily as needed. For dry eyes      . levofloxacin (LEVAQUIN) 500 MG tablet Take 1 tablet (500 mg total) by mouth daily.  15 tablet  1  . metoprolol succinate (TOPROL-XL) 25 MG 24 hr tablet Take 25 mg by mouth daily.       . metroNIDAZOLE (FLAGYL) 500 MG tablet Take 1 tablet (500 mg total) by mouth 2 (two) times  daily.  30 tablet  1  . Multiple Vitamin (MULITIVITAMIN WITH MINERALS) TABS Take 1 tablet by mouth daily.      . naproxen sodium (ANAPROX) 220 MG tablet Take 220 mg by mouth as needed. For pain      . nitroGLYCERIN (NITROSTAT) 0.4 MG SL tablet Place 0.4 mg under the tongue every 5 (five) minutes as needed. For chest pains      . pravastatin (PRAVACHOL) 20 MG tablet Take 20 mg by mouth daily.        . vitamin B-12 (CYANOCOBALAMIN) 1000 MCG tablet Take 1,000 mcg by mouth daily.         Objective: Blood pressure 110/68, pulse 76, temperature 98.7 F (37.1 C), temperature source Oral, resp. rate 20, height 5\' 11"  (1.803 m), weight 185 lb 8 oz (84.142 kg). Patient appears to be in no acute distress. Conjunctiva is pink. Sclera is nonicteric Oropharyngeal mucosa is normal. No neck masses or thyromegaly noted. Abdomen. He has long right subcostal scar. Abdomen is soft and nontender without organomegaly or masses.  No LE edema or clubbing noted.  Labs/studies Results: Liver biopsy from 01/14/2012 reveals changes of acute cholangitis.  Assessment:  Recurrent cholangitis appears to be secondary to anastomotic stricture at hepatic lobe jejunostomy. He needs to have the stricture dilated one way or the other. I doubt that this would be approachable via ERCP and he possibly would need percutaneous dilation and stenting. Obviously he would need cholangiography at that  time. Patient and his wife would like for him to be referred to [facility] where he had hepaticojejunostomy.   Plan:  Continue Levaquin and metronidazole at current dose. Report to the emergency room if he have temp greater than 101F. I will contact Dr. Gayland Curry of Biliary service at [facility] and arrange for visit ASAP.   Copy to Dr.Zack Hall,MD Copy to Dr. Cliffton Asters, MD

## 2012-01-17 NOTE — Patient Instructions (Addendum)
Restora  1 capsule by mouth daily. Referral to [facility] to be arranged. Notify if he have abdominal pain or fever greater than 101F

## 2012-02-06 DIAGNOSIS — K8309 Other cholangitis: Secondary | ICD-10-CM | POA: Diagnosis not present

## 2012-02-06 DIAGNOSIS — I10 Essential (primary) hypertension: Secondary | ICD-10-CM | POA: Diagnosis not present

## 2012-02-06 DIAGNOSIS — I2510 Atherosclerotic heart disease of native coronary artery without angina pectoris: Secondary | ICD-10-CM | POA: Diagnosis not present

## 2012-02-08 DIAGNOSIS — T819XXA Unspecified complication of procedure, initial encounter: Secondary | ICD-10-CM | POA: Diagnosis not present

## 2012-02-08 DIAGNOSIS — K8309 Other cholangitis: Secondary | ICD-10-CM | POA: Diagnosis not present

## 2012-02-15 ENCOUNTER — Telehealth: Payer: Self-pay | Admitting: Oncology

## 2012-02-15 NOTE — Telephone Encounter (Signed)
Pt/wife lmonvm to cx 9/26 appt due to surgery @ [facility]. Message to FS.

## 2012-02-18 DIAGNOSIS — I2510 Atherosclerotic heart disease of native coronary artery without angina pectoris: Secondary | ICD-10-CM | POA: Diagnosis present

## 2012-02-18 DIAGNOSIS — IMO0002 Reserved for concepts with insufficient information to code with codable children: Secondary | ICD-10-CM | POA: Diagnosis present

## 2012-02-18 DIAGNOSIS — I10 Essential (primary) hypertension: Secondary | ICD-10-CM | POA: Diagnosis present

## 2012-02-18 DIAGNOSIS — Z5189 Encounter for other specified aftercare: Secondary | ICD-10-CM | POA: Diagnosis not present

## 2012-02-18 DIAGNOSIS — Z9861 Coronary angioplasty status: Secondary | ICD-10-CM | POA: Diagnosis not present

## 2012-02-18 DIAGNOSIS — K831 Obstruction of bile duct: Secondary | ICD-10-CM | POA: Diagnosis not present

## 2012-02-18 DIAGNOSIS — I714 Abdominal aortic aneurysm, without rupture, unspecified: Secondary | ICD-10-CM | POA: Diagnosis present

## 2012-02-18 DIAGNOSIS — Z23 Encounter for immunization: Secondary | ICD-10-CM | POA: Diagnosis not present

## 2012-02-18 DIAGNOSIS — K8309 Other cholangitis: Secondary | ICD-10-CM | POA: Diagnosis not present

## 2012-02-19 DIAGNOSIS — Z9861 Coronary angioplasty status: Secondary | ICD-10-CM | POA: Diagnosis not present

## 2012-02-19 DIAGNOSIS — I2510 Atherosclerotic heart disease of native coronary artery without angina pectoris: Secondary | ICD-10-CM | POA: Diagnosis not present

## 2012-02-19 DIAGNOSIS — IMO0002 Reserved for concepts with insufficient information to code with codable children: Secondary | ICD-10-CM | POA: Diagnosis not present

## 2012-02-19 DIAGNOSIS — I10 Essential (primary) hypertension: Secondary | ICD-10-CM | POA: Diagnosis not present

## 2012-02-19 DIAGNOSIS — K8309 Other cholangitis: Secondary | ICD-10-CM | POA: Diagnosis not present

## 2012-02-19 DIAGNOSIS — I714 Abdominal aortic aneurysm, without rupture: Secondary | ICD-10-CM | POA: Diagnosis not present

## 2012-02-19 DIAGNOSIS — Z5189 Encounter for other specified aftercare: Secondary | ICD-10-CM | POA: Diagnosis not present

## 2012-02-19 DIAGNOSIS — K831 Obstruction of bile duct: Secondary | ICD-10-CM | POA: Diagnosis not present

## 2012-02-21 ENCOUNTER — Ambulatory Visit: Payer: Medicare Other | Admitting: Oncology

## 2012-02-21 ENCOUNTER — Other Ambulatory Visit: Payer: Medicare Other | Admitting: Lab

## 2012-02-22 DIAGNOSIS — Z9889 Other specified postprocedural states: Secondary | ICD-10-CM | POA: Diagnosis not present

## 2012-02-22 DIAGNOSIS — R109 Unspecified abdominal pain: Secondary | ICD-10-CM | POA: Diagnosis not present

## 2012-02-22 DIAGNOSIS — K8309 Other cholangitis: Secondary | ICD-10-CM | POA: Diagnosis not present

## 2012-02-22 DIAGNOSIS — R198 Other specified symptoms and signs involving the digestive system and abdomen: Secondary | ICD-10-CM | POA: Diagnosis not present

## 2012-02-22 DIAGNOSIS — Z79899 Other long term (current) drug therapy: Secondary | ICD-10-CM | POA: Diagnosis not present

## 2012-02-22 DIAGNOSIS — R630 Anorexia: Secondary | ICD-10-CM | POA: Diagnosis not present

## 2012-02-25 ENCOUNTER — Other Ambulatory Visit (HOSPITAL_COMMUNITY): Payer: Self-pay | Admitting: Surgery

## 2012-02-25 DIAGNOSIS — K828 Other specified diseases of gallbladder: Secondary | ICD-10-CM

## 2012-02-27 ENCOUNTER — Telehealth: Payer: Self-pay | Admitting: Oncology

## 2012-02-27 NOTE — Telephone Encounter (Signed)
Returned wife's call re r/s appt and gv her new appt for 10/25 @ 8:30 am.

## 2012-02-28 ENCOUNTER — Ambulatory Visit (HOSPITAL_COMMUNITY)
Admission: RE | Admit: 2012-02-28 | Discharge: 2012-02-28 | Disposition: A | Discharge status: Home / Self Care | Payer: Medicare Other | Source: Ambulatory Visit | Attending: Surgery | Admitting: Surgery

## 2012-02-28 DIAGNOSIS — I77811 Abdominal aortic ectasia: Secondary | ICD-10-CM | POA: Insufficient documentation

## 2012-02-28 DIAGNOSIS — Z9089 Acquired absence of other organs: Secondary | ICD-10-CM | POA: Diagnosis not present

## 2012-02-28 DIAGNOSIS — K828 Other specified diseases of gallbladder: Secondary | ICD-10-CM | POA: Diagnosis not present

## 2012-02-28 DIAGNOSIS — I700 Atherosclerosis of aorta: Secondary | ICD-10-CM | POA: Diagnosis not present

## 2012-02-28 MED ORDER — IOHEXOL 300 MG/ML  SOLN
100.0000 mL | Freq: Once | INTRAMUSCULAR | Status: AC | PRN
  Administered 2012-02-28: 100 mL via INTRAVENOUS

## 2012-03-18 DIAGNOSIS — T85698A Other mechanical complication of other specified internal prosthetic devices, implants and grafts, initial encounter: Secondary | ICD-10-CM | POA: Diagnosis not present

## 2012-03-18 DIAGNOSIS — K8309 Other cholangitis: Secondary | ICD-10-CM | POA: Diagnosis not present

## 2012-03-18 DIAGNOSIS — Y838 Other surgical procedures as the cause of abnormal reaction of the patient, or of later complication, without mention of misadventure at the time of the procedure: Secondary | ICD-10-CM | POA: Diagnosis not present

## 2012-03-21 ENCOUNTER — Other Ambulatory Visit (HOSPITAL_BASED_OUTPATIENT_CLINIC_OR_DEPARTMENT_OTHER): Payer: Medicare Other

## 2012-03-21 ENCOUNTER — Telehealth: Payer: Self-pay | Admitting: Oncology

## 2012-03-21 ENCOUNTER — Ambulatory Visit (HOSPITAL_BASED_OUTPATIENT_CLINIC_OR_DEPARTMENT_OTHER): Payer: Medicare Other | Admitting: Oncology

## 2012-03-21 VITALS — BP 109/71 | HR 68 | Temp 97.1°F | Resp 18 | Ht 71.0 in | Wt 177.2 lb

## 2012-03-21 DIAGNOSIS — K8309 Other cholangitis: Secondary | ICD-10-CM

## 2012-03-21 DIAGNOSIS — C61 Malignant neoplasm of prostate: Secondary | ICD-10-CM

## 2012-03-21 DIAGNOSIS — M949 Disorder of cartilage, unspecified: Secondary | ICD-10-CM

## 2012-03-21 DIAGNOSIS — K750 Abscess of liver: Secondary | ICD-10-CM

## 2012-03-21 DIAGNOSIS — N400 Enlarged prostate without lower urinary tract symptoms: Secondary | ICD-10-CM

## 2012-03-21 DIAGNOSIS — M899 Disorder of bone, unspecified: Secondary | ICD-10-CM | POA: Diagnosis not present

## 2012-03-21 LAB — COMPREHENSIVE METABOLIC PANEL (CC13)
AST: 30 U/L (ref 5–34)
Alkaline Phosphatase: 214 U/L — ABNORMAL HIGH (ref 40–150)
BUN: 13 mg/dL (ref 7.0–26.0)
Creatinine: 0.8 mg/dL (ref 0.7–1.3)
Glucose: 92 mg/dl (ref 70–99)
Total Bilirubin: 1.3 mg/dL — ABNORMAL HIGH (ref 0.20–1.20)

## 2012-03-21 LAB — CBC WITH DIFFERENTIAL/PLATELET
Eosinophils Absolute: 0.1 10*3/uL (ref 0.0–0.5)
MONO#: 0.4 10*3/uL (ref 0.1–0.9)
MONO%: 6.3 % (ref 0.0–14.0)
NEUT#: 4.4 10*3/uL (ref 1.5–6.5)
RBC: 4.89 10*6/uL (ref 4.20–5.82)
RDW: 14.1 % (ref 11.0–14.6)
WBC: 6.1 10*3/uL (ref 4.0–10.3)

## 2012-03-21 NOTE — Progress Notes (Signed)
Hematology and Oncology Follow Up Visit  Alexander Burton [id number] 04-21-1941 71 y.o. 03/21/2012 8:59 AM   Principle Diagnosis: 45 -year-old with a history of prostatic enlargement and possible sclerotic bony lesions rule out metastatic malignancy vs. Benign etiology.    Interim History:  71 year old gentleman who I saw as an inpatient consult during his recent hospitalization in the end of October of 2012. At that time he was found to have a hepatic abscess and underwent CT-guided drainage of abscess as well as required intravenous antibiotics. During that hospitalization he underwent a CT scan of the abdomen and pelvis which showed a sclerotic bony lesions both in the ribs as well as the spine. Patient has a known history of benign prostatic as well as elevated PSA in the past however he does not have a history of prostate cancer. Patient had been followed by Dr. Gaynelle Burton and have had a mildly elevated PSA for years. He had had a prosthetic biopsy in the past that have been benign.  His Lesions by bone scan appeared stable over the last year.   Since his last visit, Alexander Burton had multiple evaluations for his  Recurrent cholangitis and had a procedure done at [facility]. clinically is feeling well is not reporting any abdominal pain is not reporting any fevers or chills is not reporting any increase in his lower back pain he does not have any neurological symptoms does not have any paresthesias or anesthesia is. He did not have any change in his bowel habits overall he is regaining most activities of daily living. He does reports night sweats at times as well as some weight loss. He is continue to be on antibiotics.   Medications: I have reviewed the patient's current medications.  Allergies: No Known Allergies   Review of Systems: Constitutional:  Negative for fever, chills, night sweats, anorexia, weight loss, pain. Cardiovascular: no chest pain or dyspnea on exertion Respiratory: no  cough, shortness of breath, or wheezing Neurological: negative Dermatological: negative ENT: negative Skin Gastrointestinal: no abdominal pain, change in bowel habits, or black or bloody stools Genito-Urinary: no dysuria, trouble voiding, or hematuria Hematological and Lymphatic: negative Breast: negative Musculoskeletal: negative Remaining ROS negative.  Physical Exam: Blood pressure 109/71, pulse 68, temperature 97.1 F (36.2 C), temperature source Oral, resp. rate 18, height 5\' 11"  (1.803 m), weight 177 lb 3.2 oz (80.377 kg). ECOG: 1 General appearance: alert and no distress Head: Normocephalic, without obvious abnormality, atraumatic Neck: no adenopathy, no carotid bruit, no JVD, supple, symmetrical, trachea midline and thyroid not enlarged, symmetric, no tenderness/mass/nodules Lymph nodes: Cervical, supraclavicular, and axillary nodes normal. Heart:regular rate and rhythm, no murmurs or gallops Lung:chest clear, no wheezing, rales, normal symmetric air entry, Heart exam - S1, S2 normal, no murmur, no gallop, rate regular Abdomin:normal EXT:no erythema, induration, or nodules Lab Results: Lab Results  Component Value Date   WBC 6.1 03/21/2012   HGB 14.9 03/21/2012   HCT 43.8 03/21/2012   MCV 89.7 03/21/2012   PLT 138* 03/21/2012     Chemistry      Component Value Date/Time   NA 142 12/14/2011 1546   K 4.3 12/14/2011 1546   CL 106 12/14/2011 1546   CO2 29 12/14/2011 1546   BUN 20 12/14/2011 1546   CREATININE 0.94 12/14/2011 1546   CREATININE 0.89 08/23/2011 1139      Component Value Date/Time   CALCIUM 8.8 12/14/2011 1546   ALKPHOS 523* 12/31/2011 1736   AST 51* 12/31/2011 1736  ALT 67* 12/31/2011 1736   BILITOT 0.7 12/31/2011 1736     CT ABDOMEN WITH CONTRAST 02/27/2012  Stable appearance of multiple sclerotic bony lesions dating  back to at least March 25, 2011.  Surgical changes of cholecystectomy and hepaticojejunostomy. A  percutaneous transhepatic biliary drain  enters the liver via the  right anterior biliary ductal system and passes through the  hepaticojejunostomy. The biliary drain is coiled upon itself  within the proximal jejunum. No evidence of enteric, or biliary  obstruction. The side-to-side enteral enteric anastomosis in the  right lower quadrant is patent and mildly dilated.   Impression and Plan: 71 year old gentleman with the following issues:  1. Questionable sclerotic bony lesions in the spine that could be related to malignancy versus benign bone findings. I have personally reviewed these imaging studies with radiology and as mentioned the differential diagnosis includes possible benign causes however metastatic malignancy could not be completely ruled out. His follow up Bone scan on 12/17/2011 is unchanged makes it less likely a cancer diagnosis. His CT scan from 10/2 was reviewed and support benign findings.  I will repeat his bone scan next year.   2. Recurrent cholangitis appears to be secondary to anastomotic stricture at hepatic lobe jejunostomy. Managed by hepatology.  3. Weakness/fever/ sweats. This is likely due to cholangitis which it seems to be improving.     Alexander Signore, MD 10/25/20138:59 AM

## 2012-03-21 NOTE — Telephone Encounter (Signed)
Gave pt appt for June 2014 lab and Bone scan then MD visit

## 2012-03-25 DIAGNOSIS — K8309 Other cholangitis: Secondary | ICD-10-CM | POA: Diagnosis not present

## 2012-03-28 DIAGNOSIS — J9819 Other pulmonary collapse: Secondary | ICD-10-CM | POA: Diagnosis not present

## 2012-03-28 DIAGNOSIS — R197 Diarrhea, unspecified: Secondary | ICD-10-CM | POA: Diagnosis not present

## 2012-03-28 DIAGNOSIS — I10 Essential (primary) hypertension: Secondary | ICD-10-CM | POA: Diagnosis present

## 2012-03-28 DIAGNOSIS — I739 Peripheral vascular disease, unspecified: Secondary | ICD-10-CM | POA: Diagnosis present

## 2012-03-28 DIAGNOSIS — K831 Obstruction of bile duct: Secondary | ICD-10-CM | POA: Diagnosis not present

## 2012-03-28 DIAGNOSIS — T8579XA Infection and inflammatory reaction due to other internal prosthetic devices, implants and grafts, initial encounter: Secondary | ICD-10-CM | POA: Diagnosis not present

## 2012-03-28 DIAGNOSIS — Z85820 Personal history of malignant melanoma of skin: Secondary | ICD-10-CM | POA: Diagnosis not present

## 2012-03-28 DIAGNOSIS — R112 Nausea with vomiting, unspecified: Secondary | ICD-10-CM | POA: Diagnosis not present

## 2012-03-28 DIAGNOSIS — K8309 Other cholangitis: Secondary | ICD-10-CM | POA: Diagnosis not present

## 2012-03-28 DIAGNOSIS — I714 Abdominal aortic aneurysm, without rupture: Secondary | ICD-10-CM | POA: Diagnosis present

## 2012-03-28 DIAGNOSIS — Z9861 Coronary angioplasty status: Secondary | ICD-10-CM | POA: Diagnosis not present

## 2012-03-28 DIAGNOSIS — I2510 Atherosclerotic heart disease of native coronary artery without angina pectoris: Secondary | ICD-10-CM | POA: Diagnosis present

## 2012-03-28 DIAGNOSIS — N400 Enlarged prostate without lower urinary tract symptoms: Secondary | ICD-10-CM | POA: Diagnosis present

## 2012-03-28 DIAGNOSIS — R509 Fever, unspecified: Secondary | ICD-10-CM | POA: Diagnosis not present

## 2012-04-03 DIAGNOSIS — Z4682 Encounter for fitting and adjustment of non-vascular catheter: Secondary | ICD-10-CM | POA: Diagnosis not present

## 2012-04-03 DIAGNOSIS — Y838 Other surgical procedures as the cause of abnormal reaction of the patient, or of later complication, without mention of misadventure at the time of the procedure: Secondary | ICD-10-CM | POA: Diagnosis not present

## 2012-04-03 DIAGNOSIS — K8309 Other cholangitis: Secondary | ICD-10-CM | POA: Diagnosis not present

## 2012-04-03 DIAGNOSIS — T85698A Other mechanical complication of other specified internal prosthetic devices, implants and grafts, initial encounter: Secondary | ICD-10-CM | POA: Diagnosis not present

## 2012-04-11 DIAGNOSIS — K8309 Other cholangitis: Secondary | ICD-10-CM | POA: Diagnosis not present

## 2012-04-14 ENCOUNTER — Encounter (INDEPENDENT_AMBULATORY_CARE_PROVIDER_SITE_OTHER): Payer: Self-pay

## 2012-05-02 DIAGNOSIS — K8309 Other cholangitis: Secondary | ICD-10-CM | POA: Diagnosis not present

## 2012-06-02 DIAGNOSIS — K831 Obstruction of bile duct: Secondary | ICD-10-CM | POA: Diagnosis not present

## 2012-06-02 DIAGNOSIS — K8309 Other cholangitis: Secondary | ICD-10-CM | POA: Diagnosis not present

## 2012-06-09 DIAGNOSIS — N400 Enlarged prostate without lower urinary tract symptoms: Secondary | ICD-10-CM | POA: Diagnosis not present

## 2012-06-09 DIAGNOSIS — R21 Rash and other nonspecific skin eruption: Secondary | ICD-10-CM | POA: Diagnosis not present

## 2012-06-09 DIAGNOSIS — R972 Elevated prostate specific antigen [PSA]: Secondary | ICD-10-CM | POA: Diagnosis not present

## 2012-06-09 DIAGNOSIS — N320 Bladder-neck obstruction: Secondary | ICD-10-CM | POA: Diagnosis not present

## 2012-06-09 DIAGNOSIS — E291 Testicular hypofunction: Secondary | ICD-10-CM | POA: Diagnosis not present

## 2012-06-20 DIAGNOSIS — K8309 Other cholangitis: Secondary | ICD-10-CM | POA: Diagnosis not present

## 2012-07-21 DIAGNOSIS — L738 Other specified follicular disorders: Secondary | ICD-10-CM | POA: Diagnosis not present

## 2012-07-21 DIAGNOSIS — R197 Diarrhea, unspecified: Secondary | ICD-10-CM | POA: Diagnosis not present

## 2012-07-25 DIAGNOSIS — K8309 Other cholangitis: Secondary | ICD-10-CM | POA: Diagnosis not present

## 2012-08-08 DIAGNOSIS — K8309 Other cholangitis: Secondary | ICD-10-CM | POA: Diagnosis not present

## 2012-08-08 DIAGNOSIS — Y838 Other surgical procedures as the cause of abnormal reaction of the patient, or of later complication, without mention of misadventure at the time of the procedure: Secondary | ICD-10-CM | POA: Diagnosis not present

## 2012-08-08 DIAGNOSIS — T85698A Other mechanical complication of other specified internal prosthetic devices, implants and grafts, initial encounter: Secondary | ICD-10-CM | POA: Diagnosis not present

## 2012-08-08 DIAGNOSIS — Z434 Encounter for attention to other artificial openings of digestive tract: Secondary | ICD-10-CM | POA: Diagnosis not present

## 2012-08-08 DIAGNOSIS — I10 Essential (primary) hypertension: Secondary | ICD-10-CM | POA: Diagnosis not present

## 2012-08-15 DIAGNOSIS — Z9089 Acquired absence of other organs: Secondary | ICD-10-CM | POA: Diagnosis not present

## 2012-08-15 DIAGNOSIS — K831 Obstruction of bile duct: Secondary | ICD-10-CM | POA: Diagnosis not present

## 2012-08-15 DIAGNOSIS — Z931 Gastrostomy status: Secondary | ICD-10-CM | POA: Diagnosis not present

## 2012-08-15 DIAGNOSIS — R509 Fever, unspecified: Secondary | ICD-10-CM | POA: Diagnosis not present

## 2012-08-15 DIAGNOSIS — R059 Cough, unspecified: Secondary | ICD-10-CM | POA: Diagnosis not present

## 2012-08-15 DIAGNOSIS — K7689 Other specified diseases of liver: Secondary | ICD-10-CM | POA: Diagnosis not present

## 2012-08-15 DIAGNOSIS — R110 Nausea: Secondary | ICD-10-CM | POA: Diagnosis not present

## 2012-08-15 DIAGNOSIS — G4733 Obstructive sleep apnea (adult) (pediatric): Secondary | ICD-10-CM | POA: Diagnosis not present

## 2012-08-15 DIAGNOSIS — R05 Cough: Secondary | ICD-10-CM | POA: Diagnosis not present

## 2012-08-15 DIAGNOSIS — I2510 Atherosclerotic heart disease of native coronary artery without angina pectoris: Secondary | ICD-10-CM | POA: Diagnosis not present

## 2012-08-15 DIAGNOSIS — I714 Abdominal aortic aneurysm, without rupture: Secondary | ICD-10-CM | POA: Diagnosis not present

## 2012-08-15 DIAGNOSIS — Z7982 Long term (current) use of aspirin: Secondary | ICD-10-CM | POA: Diagnosis not present

## 2012-08-15 DIAGNOSIS — K8309 Other cholangitis: Secondary | ICD-10-CM | POA: Diagnosis not present

## 2012-08-15 DIAGNOSIS — Z938 Other artificial opening status: Secondary | ICD-10-CM | POA: Diagnosis not present

## 2012-08-15 DIAGNOSIS — I10 Essential (primary) hypertension: Secondary | ICD-10-CM | POA: Diagnosis not present

## 2012-08-15 DIAGNOSIS — R935 Abnormal findings on diagnostic imaging of other abdominal regions, including retroperitoneum: Secondary | ICD-10-CM | POA: Diagnosis not present

## 2012-08-15 DIAGNOSIS — E785 Hyperlipidemia, unspecified: Secondary | ICD-10-CM | POA: Diagnosis present

## 2012-08-15 DIAGNOSIS — Z79899 Other long term (current) drug therapy: Secondary | ICD-10-CM | POA: Diagnosis not present

## 2012-08-15 DIAGNOSIS — R918 Other nonspecific abnormal finding of lung field: Secondary | ICD-10-CM | POA: Diagnosis not present

## 2012-08-26 DIAGNOSIS — K831 Obstruction of bile duct: Secondary | ICD-10-CM | POA: Diagnosis not present

## 2012-08-26 DIAGNOSIS — K8309 Other cholangitis: Secondary | ICD-10-CM | POA: Diagnosis not present

## 2012-09-05 DIAGNOSIS — H04129 Dry eye syndrome of unspecified lacrimal gland: Secondary | ICD-10-CM | POA: Diagnosis not present

## 2012-09-05 DIAGNOSIS — H1045 Other chronic allergic conjunctivitis: Secondary | ICD-10-CM | POA: Diagnosis not present

## 2012-09-05 DIAGNOSIS — H2510 Age-related nuclear cataract, unspecified eye: Secondary | ICD-10-CM | POA: Diagnosis not present

## 2012-09-08 DIAGNOSIS — K8309 Other cholangitis: Secondary | ICD-10-CM | POA: Diagnosis not present

## 2012-09-08 DIAGNOSIS — K831 Obstruction of bile duct: Secondary | ICD-10-CM | POA: Diagnosis not present

## 2012-09-29 DIAGNOSIS — IMO0002 Reserved for concepts with insufficient information to code with codable children: Secondary | ICD-10-CM | POA: Diagnosis not present

## 2012-09-29 DIAGNOSIS — K831 Obstruction of bile duct: Secondary | ICD-10-CM | POA: Diagnosis not present

## 2012-09-29 DIAGNOSIS — K8309 Other cholangitis: Secondary | ICD-10-CM | POA: Diagnosis not present

## 2012-10-21 DIAGNOSIS — I10 Essential (primary) hypertension: Secondary | ICD-10-CM | POA: Diagnosis not present

## 2012-10-21 DIAGNOSIS — E782 Mixed hyperlipidemia: Secondary | ICD-10-CM | POA: Diagnosis not present

## 2012-10-24 ENCOUNTER — Telehealth: Payer: Self-pay | Admitting: Hematology & Oncology

## 2012-10-24 NOTE — Telephone Encounter (Signed)
Pt's wife called wanting to cx 6-25 and 27 appointments due to appointment at [facility] on 6-23. I gave her main CC number and told her I would transfer her to Dr. Alver Fisher RN and if she didn't answer to leave a detailed message. I transferred call to desk RN for Dr. Clelia Croft.

## 2012-11-17 DIAGNOSIS — T85698A Other mechanical complication of other specified internal prosthetic devices, implants and grafts, initial encounter: Secondary | ICD-10-CM | POA: Diagnosis not present

## 2012-11-17 DIAGNOSIS — Z980 Intestinal bypass and anastomosis status: Secondary | ICD-10-CM | POA: Diagnosis not present

## 2012-11-17 DIAGNOSIS — K8309 Other cholangitis: Secondary | ICD-10-CM | POA: Diagnosis not present

## 2012-11-17 DIAGNOSIS — Y838 Other surgical procedures as the cause of abnormal reaction of the patient, or of later complication, without mention of misadventure at the time of the procedure: Secondary | ICD-10-CM | POA: Diagnosis not present

## 2012-11-19 ENCOUNTER — Other Ambulatory Visit (HOSPITAL_BASED_OUTPATIENT_CLINIC_OR_DEPARTMENT_OTHER): Payer: Medicare Other

## 2012-11-19 ENCOUNTER — Encounter (HOSPITAL_COMMUNITY)
Admission: RE | Admit: 2012-11-19 | Discharge: 2012-11-19 | Disposition: A | Discharge status: Home / Self Care | Payer: Medicare Other | Source: Ambulatory Visit | Attending: Oncology | Admitting: Oncology

## 2012-11-19 DIAGNOSIS — M899 Disorder of bone, unspecified: Secondary | ICD-10-CM | POA: Diagnosis not present

## 2012-11-19 DIAGNOSIS — K750 Abscess of liver: Secondary | ICD-10-CM

## 2012-11-19 DIAGNOSIS — N400 Enlarged prostate without lower urinary tract symptoms: Secondary | ICD-10-CM

## 2012-11-19 DIAGNOSIS — M949 Disorder of cartilage, unspecified: Secondary | ICD-10-CM | POA: Diagnosis not present

## 2012-11-19 DIAGNOSIS — C61 Malignant neoplasm of prostate: Secondary | ICD-10-CM | POA: Diagnosis not present

## 2012-11-19 LAB — CBC WITH DIFFERENTIAL/PLATELET
Basophils Absolute: 0 10*3/uL (ref 0.0–0.1)
Eosinophils Absolute: 0.1 10*3/uL (ref 0.0–0.5)
HCT: 39.6 % (ref 38.4–49.9)
HGB: 13.5 g/dL (ref 13.0–17.1)
MCH: 29.7 pg (ref 27.2–33.4)
MCV: 87.3 fL (ref 79.3–98.0)
MONO%: 7.5 % (ref 0.0–14.0)
NEUT#: 4.3 10*3/uL (ref 1.5–6.5)
NEUT%: 67.8 % (ref 39.0–75.0)
Platelets: 148 10*3/uL (ref 140–400)
RDW: 14.4 % (ref 11.0–14.6)

## 2012-11-19 LAB — COMPREHENSIVE METABOLIC PANEL (CC13)
ALT: 23 U/L (ref 0–55)
Albumin: 2.9 g/dL — ABNORMAL LOW (ref 3.5–5.0)
CO2: 25 mEq/L (ref 22–29)
Calcium: 8.6 mg/dL (ref 8.4–10.4)
Chloride: 109 mEq/L — ABNORMAL HIGH (ref 98–107)
Potassium: 4 mEq/L (ref 3.5–5.1)
Sodium: 141 mEq/L (ref 136–145)
Total Protein: 5.9 g/dL — ABNORMAL LOW (ref 6.4–8.3)

## 2012-11-19 MED ORDER — TECHNETIUM TC 99M MEDRONATE IV KIT
25.0000 | PACK | Freq: Once | INTRAVENOUS | Status: AC | PRN
  Administered 2012-11-19: 25 via INTRAVENOUS

## 2012-11-21 ENCOUNTER — Telehealth: Payer: Self-pay | Admitting: Oncology

## 2012-11-21 ENCOUNTER — Ambulatory Visit (HOSPITAL_BASED_OUTPATIENT_CLINIC_OR_DEPARTMENT_OTHER): Payer: Medicare Other | Admitting: Oncology

## 2012-11-21 VITALS — BP 140/88 | HR 67 | Temp 97.0°F | Resp 20 | Ht 71.0 in | Wt 184.1 lb

## 2012-11-21 DIAGNOSIS — Q782 Osteopetrosis: Secondary | ICD-10-CM

## 2012-11-21 NOTE — Progress Notes (Signed)
Hematology and Oncology Follow Up Visit  Alexander Burton [id number] [birth year] 72 y.o. 11/21/2012 9:07 AM   Principle Diagnosis: 21 -year-old with a history of prostatic enlargement and possible sclerotic bony lesions rule out metastatic malignancy vs. Benign etiology.    Interim History: 72 year old gentleman who I saw as an inpatient consult during his recent hospitalization in the end of October of 2012. At that time he was found to have a hepatic abscess and underwent CT-guided drainage of abscess as well as required intravenous antibiotics. During that hospitalization he underwent a CT scan of the abdomen and pelvis which showed a sclerotic bony lesions both in the ribs as well as the spine. Patient has a known history of benign prostatic as well as elevated PSA in the past however he does not have a history of prostate cancer. Patient had been followed by Dr. Gaynelle Arabian and have had a mildly elevated PSA for years. He had had a prosthetic biopsy in the past that have been benign.  His Lesions by bone scan appeared stable over the last 2 years.   Since his last visit, [name] had multiple evaluations for his  Recurrent cholangitis and had a procedure done at [facility]. clinically is feeling well is not reporting any abdominal pain is not reporting any fevers or chills is not reporting any increase in his lower back pain he does not have any neurological symptoms does not have any paresthesias or anesthesia is. He did not have any change in his bowel habits overall he is regaining most activities of daily living.  No bone pain reported.   Medications: I have reviewed the patient's current medications.  Allergies: No Known Allergies   Review of Systems: Constitutional:  Negative for fever, chills, night sweats, anorexia, weight loss, pain. Cardiovascular: no chest pain or dyspnea on exertion Respiratory: no cough, shortness of breath, or wheezing Neurological: negative Dermatological:  negative ENT: negative Skin Gastrointestinal: no abdominal pain, change in bowel habits, or black or bloody stools Genito-Urinary: no dysuria, trouble voiding, or hematuria Hematological and Lymphatic: negative Breast: negative Musculoskeletal: negative Remaining ROS negative.  Physical Exam: Blood pressure 140/88, pulse 67, temperature 97 F (36.1 C), resp. rate 20, height 5\' 11"  (1.803 m), weight 184 lb 1.6 oz (83.507 kg). ECOG: 1 General appearance: alert and no distress Head: Normocephalic, without obvious abnormality, atraumatic Neck: no adenopathy, no carotid bruit, no JVD, supple, symmetrical, trachea midline and thyroid not enlarged, symmetric, no tenderness/mass/nodules Lymph nodes: Cervical, supraclavicular, and axillary nodes normal. Heart:regular rate and rhythm, no murmurs or gallops Lung:chest clear, no wheezing, rales, normal symmetric air entry, Heart exam - S1, S2 normal, no murmur, no gallop, rate regular Abdomin:normal EXT:no erythema, induration, or nodules Lab Results: Lab Results  Component Value Date   WBC 6.4 11/19/2012   HGB 13.5 11/19/2012   HCT 39.6 11/19/2012   MCV 87.3 11/19/2012   PLT 148 11/19/2012     Chemistry      Component Value Date/Time   NA 141 11/19/2012 1044   NA 142 12/14/2011 1546   K 4.0 11/19/2012 1044   K 4.3 12/14/2011 1546   CL 109* 11/19/2012 1044   CL 106 12/14/2011 1546   CO2 25 11/19/2012 1044   CO2 29 12/14/2011 1546   BUN 26.3* 11/19/2012 1044   BUN 20 12/14/2011 1546   CREATININE 0.9 11/19/2012 1044   CREATININE 0.94 12/14/2011 1546   CREATININE 0.89 08/23/2011 1139      Component Value Date/Time  CALCIUM 8.6 11/19/2012 1044   CALCIUM 8.8 12/14/2011 1546   ALKPHOS 282* 11/19/2012 1044   ALKPHOS 523* 12/31/2011 1736   AST 24 11/19/2012 1044   AST 51* 12/31/2011 1736   ALT 23 11/19/2012 1044   ALT 67* 12/31/2011 1736   BILITOT 0.81 11/19/2012 1044   BILITOT 0.7 12/31/2011 1736     NUCLEAR MEDICINE WHOLE BODY BONE SCINTIGRAPHY   Technique: Whole body anterior and posterior images were obtained  approximately 3 hours after intravenous injection of  radiopharmaceutical.  Radiopharmaceutical: CURIE TC-MDP TECHNETIUM TC 83M  MEDRONATE IV KIT  Comparison: 12/17/2011  Radiographic correlation: None  Findings:  Abnormal foci of increased tracer localization at the right  parietal calvarium, lateral mid left scapula, and medial margin of  the left hip joint are unchanged.  New focus of abnormal increased tracer localization at  anterolateral right seventh rib.  No additional new sites of abnormal tracer localization identified.  Expected urinary tract and soft tissue distribution of tracer.  IMPRESSION:  Stable sites of abnormal increased tracer localization at the high  right parietal bone, left scapula, and medial aspect of left hip  question osseous metastases.  New focus of increased tracer localization at the anterolateral  right seventh rib, nonspecific, metastatic disease not excluded.     Impression and Plan: 72 year old gentleman with the following issues:  1. Questionable sclerotic bony lesions in the spine that could be related to malignancy versus benign bone findings. I have personally reviewed these imaging studies. The differential diagnosis includes possible benign causes however metastatic malignancy could not be completely ruled out. His follow up Bone scan  is unchanged makes it less likely a cancer diagnosis. His CT scan from 10/2 was reviewed and support benign findings.  For the time being we will continue observation and follow up. If any of these lesions appear more obvious, we will attempt a biopsy.   2. Recurrent cholangitis appears to be secondary to anastomotic stricture at hepatic lobe jejunostomy. Managed by hepatology at [facility].   3. Weakness/fever/ sweats. This is likely due to cholangitis which it seems to be improving.     [facility], MD 6/27/20149:07 AM

## 2012-11-21 NOTE — Telephone Encounter (Signed)
Pt wants to come by at the end of June 2015 lab and MD ok with Dr. Clelia Croft

## 2012-11-22 ENCOUNTER — Encounter: Payer: Self-pay | Admitting: *Deleted

## 2012-11-24 ENCOUNTER — Encounter: Payer: Self-pay | Admitting: Cardiology

## 2012-11-25 ENCOUNTER — Encounter: Payer: Self-pay | Admitting: Cardiology

## 2012-11-25 ENCOUNTER — Ambulatory Visit (INDEPENDENT_AMBULATORY_CARE_PROVIDER_SITE_OTHER): Payer: Medicare Other | Admitting: Cardiology

## 2012-11-25 VITALS — BP 116/76 | HR 58 | Ht 71.0 in | Wt 184.7 lb

## 2012-11-25 DIAGNOSIS — K8309 Other cholangitis: Secondary | ICD-10-CM

## 2012-11-25 DIAGNOSIS — Z9861 Coronary angioplasty status: Secondary | ICD-10-CM

## 2012-11-25 DIAGNOSIS — I4891 Unspecified atrial fibrillation: Secondary | ICD-10-CM

## 2012-11-25 DIAGNOSIS — Z955 Presence of coronary angioplasty implant and graft: Secondary | ICD-10-CM

## 2012-11-25 DIAGNOSIS — I2510 Atherosclerotic heart disease of native coronary artery without angina pectoris: Secondary | ICD-10-CM

## 2012-11-25 DIAGNOSIS — Z01818 Encounter for other preprocedural examination: Secondary | ICD-10-CM

## 2012-11-25 DIAGNOSIS — I480 Paroxysmal atrial fibrillation: Secondary | ICD-10-CM

## 2012-11-25 DIAGNOSIS — I10 Essential (primary) hypertension: Secondary | ICD-10-CM

## 2012-11-25 MED ORDER — PRAVASTATIN SODIUM 20 MG PO TABS: 20.0000 mg | ORAL_TABLET | Freq: Every day | ORAL | Status: DC

## 2012-11-25 MED ORDER — METOPROLOL TARTRATE 25 MG PO TABS: 12.5000 mg | ORAL_TABLET | Freq: Two times a day (BID) | ORAL | Status: DC

## 2012-11-25 NOTE — Patient Instructions (Addendum)
Your physician wants you to follow-up in: 1 year. You will receive a reminder letter in the mail two months in advance. If you don't receive a letter, please call our office to schedule the follow-up appointment.  We sent refills of your Pravastatin & Metoprolol to your pharmacy.  If you need to have surgery in the future, contact us to set up an appointment for cardiac clearance.

## 2012-12-02 DIAGNOSIS — Z9861 Coronary angioplasty status: Secondary | ICD-10-CM | POA: Diagnosis not present

## 2012-12-02 DIAGNOSIS — K831 Obstruction of bile duct: Secondary | ICD-10-CM | POA: Diagnosis not present

## 2012-12-02 DIAGNOSIS — I10 Essential (primary) hypertension: Secondary | ICD-10-CM | POA: Diagnosis not present

## 2012-12-02 DIAGNOSIS — I2510 Atherosclerotic heart disease of native coronary artery without angina pectoris: Secondary | ICD-10-CM | POA: Diagnosis not present

## 2012-12-02 DIAGNOSIS — K8309 Other cholangitis: Secondary | ICD-10-CM | POA: Diagnosis not present

## 2012-12-03 DIAGNOSIS — H01139 Eczematous dermatitis of unspecified eye, unspecified eyelid: Secondary | ICD-10-CM | POA: Diagnosis not present

## 2012-12-03 DIAGNOSIS — H04129 Dry eye syndrome of unspecified lacrimal gland: Secondary | ICD-10-CM | POA: Diagnosis not present

## 2012-12-03 DIAGNOSIS — H0230 Blepharochalasis unspecified eye, unspecified eyelid: Secondary | ICD-10-CM | POA: Diagnosis not present

## 2012-12-03 DIAGNOSIS — H1045 Other chronic allergic conjunctivitis: Secondary | ICD-10-CM | POA: Diagnosis not present

## 2012-12-07 ENCOUNTER — Encounter: Payer: Self-pay | Admitting: Cardiology

## 2012-12-07 DIAGNOSIS — Z955 Presence of coronary angioplasty implant and graft: Secondary | ICD-10-CM | POA: Insufficient documentation

## 2012-12-07 DIAGNOSIS — I2510 Atherosclerotic heart disease of native coronary artery without angina pectoris: Secondary | ICD-10-CM | POA: Insufficient documentation

## 2012-12-07 DIAGNOSIS — I480 Paroxysmal atrial fibrillation: Secondary | ICD-10-CM | POA: Insufficient documentation

## 2012-12-07 NOTE — Progress Notes (Signed)
Patient ID: Alexander Burton, male   DOB: January 19, 1941, 72 y.o.   MRN: [id number]  Clinic Note: HPI: Alexander Burton is a 72 y.o. male with a PMH below who presents today for follow-up of coronary disease. He is a very pleasant 72 year old gentleman whom I last saw a year ago. He's had significant complications fromhis distant cholecystectomy and now has an indwelling drain. Apparently is due to have a drain exchanged at [facility] by Dr. Bennye Alm (of [facility]). There is making the need to go back for a repeat operation.  Interval History: , cardiology standpoint he is doing fine he denies any chest pain or shortness of breath with exertion.he does note occasionally getting yellowish short of breath with activity but not routinely. He does have easy bruising and bleeding but the nothing overly significant. He denies  For any lightheadedness, dizziness or wooziness except for what is having issues with his chronic liver problems. He denies any TIA or amaurosis fugax symptoms. No syncope or near-syncope. No claudication symptoms. He still remains as active as he can be with his indwelling abscess drain. I think he is on suppressive antibiotics now. He denies any melena, hematochezia or hematuria. No nosebleed.  Past Medical History  Diagnosis Date  . Prostate ca   . BPH (benign prostatic hyperplasia)   . Gastritis   . Osteoarthritis   . Melanoma     sigmoid diverticulosis  . Goiter   . Colon polyps   . CAD (coronary artery disease), native coronary artery February 2010    mid LAD lesion: PCI with 3.5 mm x 35 mm Cypher DES postdilated to 4.0 mm;   . Hyperlipidemia   . PAF (paroxysmal atrial fibrillation)   . COPD (chronic obstructive pulmonary disease)   . Syncope and collapse January 2010    PVC's and14 beats NSVT as well as atrial afib  . Sleep apnea 04/02/2005    nocturnal polysomogram ordered K.Clance  . Hepatic abscess, chronic[572.0]    resulting from distant surgery; currently with indwelling drain  . Presence of drug coated stent in LAD coronary artery     Prior Cardiac Evaluation and Past Surgical History: Past Surgical History  Procedure Laterality Date  . Cholecystectomy      complicated by rupture of common bile duct and other vascular structures. Has now had multiple issues with chronic hepatic abscess. Currently has indwelling drain  . Surgical resection, melanoma    . Colonoscopy  08/16/2011    Procedure: COLONOSCOPY;  Surgeon: Malissa Hippo, MD;  Location: [facility];  Service: Endoscopy;  Laterality: N/A;  1200  . Cardiac catheterization  Feb 2010    post DES to LAD ,3.5 X 33 mm Cypher stent,dilated up to 3.8 mm 80 to90% stenosis btwn first and second diag branc w.moderate disease EF 50%  . Doppler echocardiography  JAN 2010    EF 50 to 55% w/boderline concentic LVH,mildly dilated atrium ,frequent PVC   . Nm myocar perf wall motion  06/09/2008    EF 47% LV systolic fx mildly reduced.Ventricular fx preserved.  Clint Lipps doppler  07/28/2008    NORMAL RGT GROIN DUPLEX DOPPLER  . Cardionet monitor  06/08/2008-06/24/2008    Allergies  Allergen Reactions  . Other Itching and Other (See Comments)    Contrast dye erythema    Current Outpatient Prescriptions  Medication Sig Dispense Refill  . aspirin 81 MG tablet Take 81 mg by mouth daily.      Marland Kitchen  esomeprazole (NEXIUM) 40 MG capsule Take 40 mg by mouth daily before breakfast.        . finasteride (PROSCAR) 5 MG tablet Take 5 mg by mouth daily.        Marland Kitchen levofloxacin (LEVAQUIN) 500 MG tablet Take 500 mg by mouth Daily.      . metoprolol tartrate (LOPRESSOR) 25 MG tablet Take 0.5 tablets (12.5 mg total) by mouth 2 (two) times daily.  90 tablet  3  . Multiple Vitamin (MULITIVITAMIN WITH MINERALS) TABS Take 1 tablet by mouth daily.      . naproxen sodium (ANAPROX) 220 MG tablet Take 220 mg by mouth as needed. For pain      . nitroGLYCERIN (NITROSTAT) 0.4 MG  SL tablet Place 0.4 mg under the tongue every 5 (five) minutes as needed. For chest pains      . NON FORMULARY Place 1 drop into both eyes as needed. Restora - OTC      . pravastatin (PRAVACHOL) 20 MG tablet Take 1 tablet (20 mg total) by mouth daily.  90 tablet  3  . predniSONE (DELTASONE) 50 MG tablet Take 50 mg by mouth every 7 (seven) weeks. 13 hrs, 7 hrs, 1 hr prior to procedure      . Probiotic Product (PROBIOTIC DAILY PO) Take 1 tablet by mouth daily.      . vitamin B-12 (CYANOCOBALAMIN) 1000 MCG tablet Take 1,000 mcg by mouth daily.       No current facility-administered medications for this visit.    History   Social History  . Marital Status: Married    Spouse Name: N/A    Number of Children: 2  . Years of Education: N/A   Occupational History  .     Social History Main Topics  . Smoking status: Former Smoker -- 1.50 packs/day for 50 years    Types: Cigarettes    Quit date: 05/29/2007  . Smokeless tobacco: Never Used  . Alcohol Use: 0.5 oz/week    1 drink(s) per week  . Drug Use: No  . Sexually Active: Not on file   Other Topics Concern  . Not on file   Social History Narrative   Married father to come a grandfather 3. 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 Has more fatigue with walking but still able to walk up to 2 miles prior to his most recent hospitalization.    ROS: A comprehensive Review of Systems - Negative except pertinent positives noted above and GI issues below. Gastrointestinal ROS: indwelling drain for recurrent hepatic abscess. Due for exchange within the month. Possible upcoming surgery.  PHYSICAL EXAM BP 116/76  Pulse 58  Ht 5\' 11"  (1.803 m)  Wt 184 lb 11.2 oz (83.779 kg)  BMI 25.77 kg/m2 General appearance: alert, cooperative, appears stated age, no distress and pleasant mood and affect. Anicteric. Well-nourished. Answers questions verbally. Significant thoracic kyphosis with barrel chest. Neck: no carotid bruit and no  JVD Lungs: clear to auscultation bilaterally, normal percussion bilaterally and diminished /distant tubular breath sounds throughout Heart: regular rate and rhythm, S1, S2 normal, no murmur, click, rub or gallop, normal apical impulse and intermittently there is a split S2 Abdomen: soft/NT/M.D. as and BS. He does have a drain from the right upper quadrant that is dressed with a dressing clean dry and intact. Extremities: extremities normal, atraumatic, no cyanosis or edema, no edema, redness or tenderness in the calves or thighs and no ulcers, gangrene or trophic changes Pulses: mildly  diminished posterior tibial pulses bilaterally. Neurologic: Grossly normal  QMV:HQIONGEXB today: Yes Rate:58 , Rhythm: sinus bradycardia, otherwise normal;    ASSESSMENT: Overall stable from cardiac standpoint with possible upcoming surgery. He has not had an ischemic evaluation since his stent. I recommended he discuss with his liver doctors history if and when he should stop the Pravachol to avoid any hepatic toxicity.  CAD S/P percutaneous coronary angioplasty Relatively stable. No active symptoms but is not really been as active as he usually is. Since he is having intermittent dyspnea and is not really been active of late. I think would be prudent for risk stratification to consider a stress test, simply due to the nature of the surgery. Within not having active angina, however I would be reluctant to proceed with any invasive evaluation unless he were to have a high-risk stress test  Pre-op evaluation If there is a planned surgery, would consider Myoview stress test, based on his overall lack of activity with his current illness.  Cholangitis, recurrent I'm somewhat concerned about it being a statin for his dyslipidemia. I would defer to his hepatologist in surgeon as to whether think he should stay on pravastatin at this current time.  It would be fine to stop it if need be.  Presence of drug coated  stent in LAD coronary artery Currently just on aspirin. Not Plavix, Effient, or Brilinta.  PAF (paroxysmal atrial fibrillation) As best until he's not had any further episodes of this since his PCI. For that reason he has not been on any anticoagulation, and is only a beta blocker for rate control.  Hypertension Well-controlled on current agents.   PLAN: Per problem list. Orders Placed This Encounter  Procedures  . EKG 12-Lead    Followup: 1 year  Zephyr Sausedo W. Herbie Baltimore, M.D., M.S. [facility] [address] [place], [place]  [address]  [phone] Pager # [phone]

## 2012-12-07 NOTE — Assessment & Plan Note (Signed)
Well-controlled on current agents.

## 2012-12-07 NOTE — Assessment & Plan Note (Signed)
I'm somewhat concerned about it being a statin for his dyslipidemia. I would defer to his hepatologist in surgeon as to whether think he should stay on pravastatin at this current time.  It would be fine to stop it if need be.

## 2012-12-07 NOTE — Assessment & Plan Note (Signed)
Relatively stable. No active symptoms but is not really been as active as he usually is. Since he is having intermittent dyspnea and is not really been active of late. I think would be prudent for risk stratification to consider a stress test, simply due to the nature of the surgery. Within not having active angina, however I would be reluctant to proceed with any invasive evaluation unless he were to have a high-risk stress test

## 2012-12-07 NOTE — Assessment & Plan Note (Signed)
Currently just on aspirin. Not Plavix, Effient, or Brilinta.

## 2012-12-07 NOTE — Assessment & Plan Note (Signed)
If there is a planned surgery, would consider Myoview stress test, based on his overall lack of activity with his current illness.

## 2012-12-07 NOTE — Assessment & Plan Note (Signed)
As best until he's not had any further episodes of this since his PCI. For that reason he has not been on any anticoagulation, and is only a beta blocker for rate control.

## 2012-12-15 DIAGNOSIS — N320 Bladder-neck obstruction: Secondary | ICD-10-CM | POA: Diagnosis not present

## 2012-12-15 DIAGNOSIS — R972 Elevated prostate specific antigen [PSA]: Secondary | ICD-10-CM | POA: Diagnosis not present

## 2012-12-15 DIAGNOSIS — N400 Enlarged prostate without lower urinary tract symptoms: Secondary | ICD-10-CM | POA: Diagnosis not present

## 2013-01-14 DIAGNOSIS — K831 Obstruction of bile duct: Secondary | ICD-10-CM | POA: Diagnosis not present

## 2013-01-14 DIAGNOSIS — I739 Peripheral vascular disease, unspecified: Secondary | ICD-10-CM | POA: Diagnosis not present

## 2013-01-14 DIAGNOSIS — Z9861 Coronary angioplasty status: Secondary | ICD-10-CM | POA: Diagnosis not present

## 2013-01-14 DIAGNOSIS — I2510 Atherosclerotic heart disease of native coronary artery without angina pectoris: Secondary | ICD-10-CM | POA: Diagnosis not present

## 2013-01-14 DIAGNOSIS — K8309 Other cholangitis: Secondary | ICD-10-CM | POA: Diagnosis not present

## 2013-01-14 DIAGNOSIS — N400 Enlarged prostate without lower urinary tract symptoms: Secondary | ICD-10-CM | POA: Diagnosis not present

## 2013-01-14 DIAGNOSIS — E785 Hyperlipidemia, unspecified: Secondary | ICD-10-CM | POA: Diagnosis not present

## 2013-01-14 DIAGNOSIS — G4730 Sleep apnea, unspecified: Secondary | ICD-10-CM | POA: Diagnosis not present

## 2013-01-14 DIAGNOSIS — I10 Essential (primary) hypertension: Secondary | ICD-10-CM | POA: Diagnosis not present

## 2013-01-22 ENCOUNTER — Telehealth: Payer: Self-pay | Admitting: Cardiology

## 2013-01-22 NOTE — Telephone Encounter (Signed)
Having liver surgery on 9-3.Need to know if he need to stop his aspirin? If so when? If you don t get her on her cell phone-please leave message on her home [phone]

## 2013-01-22 NOTE — Telephone Encounter (Signed)
Message forwarded to Dr. Harding.  

## 2013-01-23 NOTE — Telephone Encounter (Signed)
We usually stop things ~5 days pre-op.   Would prefer that the patient does not stop it, but if the surgeons insist - she should not take it starting today.  Marykay Lex, MD

## 2013-01-28 DIAGNOSIS — I2510 Atherosclerotic heart disease of native coronary artery without angina pectoris: Secondary | ICD-10-CM | POA: Diagnosis not present

## 2013-01-28 DIAGNOSIS — R0902 Hypoxemia: Secondary | ICD-10-CM | POA: Diagnosis not present

## 2013-01-28 DIAGNOSIS — J9819 Other pulmonary collapse: Secondary | ICD-10-CM | POA: Diagnosis not present

## 2013-01-28 DIAGNOSIS — K831 Obstruction of bile duct: Secondary | ICD-10-CM | POA: Diagnosis not present

## 2013-01-28 DIAGNOSIS — N400 Enlarged prostate without lower urinary tract symptoms: Secondary | ICD-10-CM | POA: Diagnosis present

## 2013-01-28 DIAGNOSIS — K560 Paralytic ileus: Secondary | ICD-10-CM | POA: Diagnosis not present

## 2013-01-28 DIAGNOSIS — I10 Essential (primary) hypertension: Secondary | ICD-10-CM | POA: Diagnosis present

## 2013-01-28 DIAGNOSIS — A419 Sepsis, unspecified organism: Secondary | ICD-10-CM | POA: Diagnosis not present

## 2013-01-28 DIAGNOSIS — A4901 Methicillin susceptible Staphylococcus aureus infection, unspecified site: Secondary | ICD-10-CM | POA: Diagnosis present

## 2013-01-28 DIAGNOSIS — Z9889 Other specified postprocedural states: Secondary | ICD-10-CM | POA: Diagnosis not present

## 2013-01-28 DIAGNOSIS — Z9861 Coronary angioplasty status: Secondary | ICD-10-CM | POA: Diagnosis not present

## 2013-01-28 DIAGNOSIS — R918 Other nonspecific abnormal finding of lung field: Secondary | ICD-10-CM | POA: Diagnosis not present

## 2013-01-28 DIAGNOSIS — I739 Peripheral vascular disease, unspecified: Secondary | ICD-10-CM | POA: Diagnosis present

## 2013-01-28 DIAGNOSIS — Z85820 Personal history of malignant melanoma of skin: Secondary | ICD-10-CM | POA: Diagnosis not present

## 2013-01-28 DIAGNOSIS — R6510 Systemic inflammatory response syndrome (SIRS) of non-infectious origin without acute organ dysfunction: Secondary | ICD-10-CM | POA: Diagnosis not present

## 2013-01-28 DIAGNOSIS — E785 Hyperlipidemia, unspecified: Secondary | ICD-10-CM | POA: Diagnosis present

## 2013-01-28 DIAGNOSIS — K660 Peritoneal adhesions (postprocedural) (postinfection): Secondary | ICD-10-CM | POA: Diagnosis present

## 2013-01-28 DIAGNOSIS — E876 Hypokalemia: Secondary | ICD-10-CM | POA: Diagnosis not present

## 2013-01-28 DIAGNOSIS — B3789 Other sites of candidiasis: Secondary | ICD-10-CM | POA: Diagnosis present

## 2013-01-28 DIAGNOSIS — K929 Disease of digestive system, unspecified: Secondary | ICD-10-CM | POA: Diagnosis present

## 2013-01-28 DIAGNOSIS — Z4682 Encounter for fitting and adjustment of non-vascular catheter: Secondary | ICD-10-CM | POA: Diagnosis not present

## 2013-01-28 DIAGNOSIS — J90 Pleural effusion, not elsewhere classified: Secondary | ICD-10-CM | POA: Diagnosis not present

## 2013-01-28 DIAGNOSIS — K8309 Other cholangitis: Secondary | ICD-10-CM | POA: Diagnosis not present

## 2013-01-28 DIAGNOSIS — G4730 Sleep apnea, unspecified: Secondary | ICD-10-CM | POA: Diagnosis present

## 2013-01-28 NOTE — Telephone Encounter (Signed)
Returned call to [place].  Stated pt did stop ASA and is in surgery now.  RN apologized for miscommunication as RN was out of the office and message was not received until today.  Verbalized understanding.

## 2013-02-07 DIAGNOSIS — I739 Peripheral vascular disease, unspecified: Secondary | ICD-10-CM | POA: Diagnosis not present

## 2013-02-07 DIAGNOSIS — I10 Essential (primary) hypertension: Secondary | ICD-10-CM | POA: Diagnosis not present

## 2013-02-07 DIAGNOSIS — G4733 Obstructive sleep apnea (adult) (pediatric): Secondary | ICD-10-CM | POA: Diagnosis not present

## 2013-02-07 DIAGNOSIS — I2510 Atherosclerotic heart disease of native coronary artery without angina pectoris: Secondary | ICD-10-CM | POA: Diagnosis not present

## 2013-02-09 DIAGNOSIS — I10 Essential (primary) hypertension: Secondary | ICD-10-CM | POA: Diagnosis not present

## 2013-02-09 DIAGNOSIS — I2510 Atherosclerotic heart disease of native coronary artery without angina pectoris: Secondary | ICD-10-CM | POA: Diagnosis not present

## 2013-02-09 DIAGNOSIS — G4733 Obstructive sleep apnea (adult) (pediatric): Secondary | ICD-10-CM | POA: Diagnosis not present

## 2013-02-09 DIAGNOSIS — I739 Peripheral vascular disease, unspecified: Secondary | ICD-10-CM | POA: Diagnosis not present

## 2013-02-10 DIAGNOSIS — I10 Essential (primary) hypertension: Secondary | ICD-10-CM | POA: Diagnosis not present

## 2013-02-10 DIAGNOSIS — G4733 Obstructive sleep apnea (adult) (pediatric): Secondary | ICD-10-CM | POA: Diagnosis not present

## 2013-02-10 DIAGNOSIS — I2510 Atherosclerotic heart disease of native coronary artery without angina pectoris: Secondary | ICD-10-CM | POA: Diagnosis not present

## 2013-02-10 DIAGNOSIS — I739 Peripheral vascular disease, unspecified: Secondary | ICD-10-CM | POA: Diagnosis not present

## 2013-02-11 DIAGNOSIS — G4733 Obstructive sleep apnea (adult) (pediatric): Secondary | ICD-10-CM | POA: Diagnosis not present

## 2013-02-11 DIAGNOSIS — I739 Peripheral vascular disease, unspecified: Secondary | ICD-10-CM | POA: Diagnosis not present

## 2013-02-11 DIAGNOSIS — I10 Essential (primary) hypertension: Secondary | ICD-10-CM | POA: Diagnosis not present

## 2013-02-11 DIAGNOSIS — I2510 Atherosclerotic heart disease of native coronary artery without angina pectoris: Secondary | ICD-10-CM | POA: Diagnosis not present

## 2013-02-12 DIAGNOSIS — I10 Essential (primary) hypertension: Secondary | ICD-10-CM | POA: Diagnosis not present

## 2013-02-12 DIAGNOSIS — I2510 Atherosclerotic heart disease of native coronary artery without angina pectoris: Secondary | ICD-10-CM | POA: Diagnosis not present

## 2013-02-12 DIAGNOSIS — G4733 Obstructive sleep apnea (adult) (pediatric): Secondary | ICD-10-CM | POA: Diagnosis not present

## 2013-02-12 DIAGNOSIS — I739 Peripheral vascular disease, unspecified: Secondary | ICD-10-CM | POA: Diagnosis not present

## 2013-02-16 DIAGNOSIS — I739 Peripheral vascular disease, unspecified: Secondary | ICD-10-CM | POA: Diagnosis not present

## 2013-02-16 DIAGNOSIS — I2510 Atherosclerotic heart disease of native coronary artery without angina pectoris: Secondary | ICD-10-CM | POA: Diagnosis not present

## 2013-02-16 DIAGNOSIS — I10 Essential (primary) hypertension: Secondary | ICD-10-CM | POA: Diagnosis not present

## 2013-02-16 DIAGNOSIS — R300 Dysuria: Secondary | ICD-10-CM | POA: Diagnosis not present

## 2013-02-16 DIAGNOSIS — G4733 Obstructive sleep apnea (adult) (pediatric): Secondary | ICD-10-CM | POA: Diagnosis not present

## 2013-02-17 DIAGNOSIS — I739 Peripheral vascular disease, unspecified: Secondary | ICD-10-CM | POA: Diagnosis not present

## 2013-02-17 DIAGNOSIS — G4733 Obstructive sleep apnea (adult) (pediatric): Secondary | ICD-10-CM | POA: Diagnosis not present

## 2013-02-17 DIAGNOSIS — I2510 Atherosclerotic heart disease of native coronary artery without angina pectoris: Secondary | ICD-10-CM | POA: Diagnosis not present

## 2013-02-17 DIAGNOSIS — I10 Essential (primary) hypertension: Secondary | ICD-10-CM | POA: Diagnosis not present

## 2013-02-19 DIAGNOSIS — I739 Peripheral vascular disease, unspecified: Secondary | ICD-10-CM | POA: Diagnosis not present

## 2013-02-19 DIAGNOSIS — I10 Essential (primary) hypertension: Secondary | ICD-10-CM | POA: Diagnosis not present

## 2013-02-19 DIAGNOSIS — G4733 Obstructive sleep apnea (adult) (pediatric): Secondary | ICD-10-CM | POA: Diagnosis not present

## 2013-02-19 DIAGNOSIS — I2510 Atherosclerotic heart disease of native coronary artery without angina pectoris: Secondary | ICD-10-CM | POA: Diagnosis not present

## 2013-02-20 DIAGNOSIS — K831 Obstruction of bile duct: Secondary | ICD-10-CM | POA: Diagnosis not present

## 2013-02-20 DIAGNOSIS — Z9889 Other specified postprocedural states: Secondary | ICD-10-CM | POA: Diagnosis not present

## 2013-02-20 DIAGNOSIS — K8309 Other cholangitis: Secondary | ICD-10-CM | POA: Diagnosis not present

## 2013-02-24 DIAGNOSIS — R609 Edema, unspecified: Secondary | ICD-10-CM | POA: Diagnosis not present

## 2013-02-24 DIAGNOSIS — K750 Abscess of liver: Secondary | ICD-10-CM | POA: Diagnosis not present

## 2013-02-24 DIAGNOSIS — I10 Essential (primary) hypertension: Secondary | ICD-10-CM | POA: Diagnosis present

## 2013-02-24 DIAGNOSIS — T819XXA Unspecified complication of procedure, initial encounter: Secondary | ICD-10-CM | POA: Diagnosis not present

## 2013-02-24 DIAGNOSIS — Z9889 Other specified postprocedural states: Secondary | ICD-10-CM | POA: Diagnosis not present

## 2013-02-24 DIAGNOSIS — R918 Other nonspecific abnormal finding of lung field: Secondary | ICD-10-CM | POA: Diagnosis not present

## 2013-02-24 DIAGNOSIS — R197 Diarrhea, unspecified: Secondary | ICD-10-CM | POA: Diagnosis not present

## 2013-02-24 DIAGNOSIS — E785 Hyperlipidemia, unspecified: Secondary | ICD-10-CM | POA: Diagnosis present

## 2013-02-24 DIAGNOSIS — N400 Enlarged prostate without lower urinary tract symptoms: Secondary | ICD-10-CM | POA: Diagnosis present

## 2013-02-24 DIAGNOSIS — K831 Obstruction of bile duct: Secondary | ICD-10-CM | POA: Diagnosis not present

## 2013-02-24 DIAGNOSIS — J90 Pleural effusion, not elsewhere classified: Secondary | ICD-10-CM | POA: Diagnosis not present

## 2013-02-24 DIAGNOSIS — J9819 Other pulmonary collapse: Secondary | ICD-10-CM | POA: Diagnosis not present

## 2013-02-24 DIAGNOSIS — I2510 Atherosclerotic heart disease of native coronary artery without angina pectoris: Secondary | ICD-10-CM | POA: Diagnosis present

## 2013-02-24 DIAGNOSIS — Z4682 Encounter for fitting and adjustment of non-vascular catheter: Secondary | ICD-10-CM | POA: Diagnosis not present

## 2013-02-24 DIAGNOSIS — R188 Other ascites: Secondary | ICD-10-CM | POA: Diagnosis not present

## 2013-02-24 DIAGNOSIS — J95811 Postprocedural pneumothorax: Secondary | ICD-10-CM | POA: Diagnosis not present

## 2013-02-24 DIAGNOSIS — E860 Dehydration: Secondary | ICD-10-CM | POA: Diagnosis present

## 2013-02-24 DIAGNOSIS — R109 Unspecified abdominal pain: Secondary | ICD-10-CM | POA: Diagnosis not present

## 2013-02-24 DIAGNOSIS — J9383 Other pneumothorax: Secondary | ICD-10-CM | POA: Diagnosis not present

## 2013-02-24 DIAGNOSIS — K651 Peritoneal abscess: Secondary | ICD-10-CM | POA: Diagnosis not present

## 2013-02-24 DIAGNOSIS — R509 Fever, unspecified: Secondary | ICD-10-CM | POA: Diagnosis not present

## 2013-03-03 DIAGNOSIS — I2510 Atherosclerotic heart disease of native coronary artery without angina pectoris: Secondary | ICD-10-CM | POA: Diagnosis not present

## 2013-03-03 DIAGNOSIS — I10 Essential (primary) hypertension: Secondary | ICD-10-CM | POA: Diagnosis not present

## 2013-03-03 DIAGNOSIS — I739 Peripheral vascular disease, unspecified: Secondary | ICD-10-CM | POA: Diagnosis not present

## 2013-03-03 DIAGNOSIS — G4733 Obstructive sleep apnea (adult) (pediatric): Secondary | ICD-10-CM | POA: Diagnosis not present

## 2013-03-04 DIAGNOSIS — I10 Essential (primary) hypertension: Secondary | ICD-10-CM | POA: Diagnosis not present

## 2013-03-04 DIAGNOSIS — I2510 Atherosclerotic heart disease of native coronary artery without angina pectoris: Secondary | ICD-10-CM | POA: Diagnosis not present

## 2013-03-04 DIAGNOSIS — G4733 Obstructive sleep apnea (adult) (pediatric): Secondary | ICD-10-CM | POA: Diagnosis not present

## 2013-03-04 DIAGNOSIS — I739 Peripheral vascular disease, unspecified: Secondary | ICD-10-CM | POA: Diagnosis not present

## 2013-03-06 DIAGNOSIS — G4730 Sleep apnea, unspecified: Secondary | ICD-10-CM | POA: Diagnosis not present

## 2013-03-06 DIAGNOSIS — Z4682 Encounter for fitting and adjustment of non-vascular catheter: Secondary | ICD-10-CM | POA: Diagnosis not present

## 2013-03-06 DIAGNOSIS — R188 Other ascites: Secondary | ICD-10-CM | POA: Diagnosis not present

## 2013-03-06 DIAGNOSIS — I739 Peripheral vascular disease, unspecified: Secondary | ICD-10-CM | POA: Diagnosis not present

## 2013-03-06 DIAGNOSIS — I10 Essential (primary) hypertension: Secondary | ICD-10-CM | POA: Diagnosis not present

## 2013-03-06 DIAGNOSIS — R110 Nausea: Secondary | ICD-10-CM | POA: Diagnosis not present

## 2013-03-06 DIAGNOSIS — R5381 Other malaise: Secondary | ICD-10-CM | POA: Diagnosis not present

## 2013-03-06 DIAGNOSIS — M7989 Other specified soft tissue disorders: Secondary | ICD-10-CM | POA: Diagnosis not present

## 2013-03-06 DIAGNOSIS — N400 Enlarged prostate without lower urinary tract symptoms: Secondary | ICD-10-CM | POA: Diagnosis not present

## 2013-03-06 DIAGNOSIS — T85898A Other specified complication of other internal prosthetic devices, implants and grafts, initial encounter: Secondary | ICD-10-CM | POA: Diagnosis not present

## 2013-03-06 DIAGNOSIS — K750 Abscess of liver: Secondary | ICD-10-CM | POA: Diagnosis not present

## 2013-03-06 DIAGNOSIS — K8309 Other cholangitis: Secondary | ICD-10-CM | POA: Diagnosis not present

## 2013-03-06 DIAGNOSIS — Z79899 Other long term (current) drug therapy: Secondary | ICD-10-CM | POA: Diagnosis not present

## 2013-03-06 DIAGNOSIS — I319 Disease of pericardium, unspecified: Secondary | ICD-10-CM | POA: Diagnosis not present

## 2013-03-06 DIAGNOSIS — E785 Hyperlipidemia, unspecified: Secondary | ICD-10-CM | POA: Diagnosis not present

## 2013-03-06 DIAGNOSIS — R609 Edema, unspecified: Secondary | ICD-10-CM | POA: Diagnosis not present

## 2013-03-06 DIAGNOSIS — I2510 Atherosclerotic heart disease of native coronary artery without angina pectoris: Secondary | ICD-10-CM | POA: Diagnosis not present

## 2013-03-07 DIAGNOSIS — R110 Nausea: Secondary | ICD-10-CM | POA: Diagnosis not present

## 2013-03-07 DIAGNOSIS — Z4682 Encounter for fitting and adjustment of non-vascular catheter: Secondary | ICD-10-CM | POA: Diagnosis not present

## 2013-03-07 DIAGNOSIS — I319 Disease of pericardium, unspecified: Secondary | ICD-10-CM | POA: Diagnosis not present

## 2013-03-07 DIAGNOSIS — G4730 Sleep apnea, unspecified: Secondary | ICD-10-CM | POA: Diagnosis not present

## 2013-03-07 DIAGNOSIS — T85898A Other specified complication of other internal prosthetic devices, implants and grafts, initial encounter: Secondary | ICD-10-CM | POA: Diagnosis not present

## 2013-03-07 DIAGNOSIS — K8309 Other cholangitis: Secondary | ICD-10-CM | POA: Diagnosis not present

## 2013-03-07 DIAGNOSIS — R188 Other ascites: Secondary | ICD-10-CM | POA: Diagnosis not present

## 2013-03-07 DIAGNOSIS — R609 Edema, unspecified: Secondary | ICD-10-CM | POA: Diagnosis not present

## 2013-03-07 DIAGNOSIS — K750 Abscess of liver: Secondary | ICD-10-CM | POA: Diagnosis not present

## 2013-03-07 DIAGNOSIS — I10 Essential (primary) hypertension: Secondary | ICD-10-CM | POA: Diagnosis not present

## 2013-03-08 DIAGNOSIS — I10 Essential (primary) hypertension: Secondary | ICD-10-CM | POA: Diagnosis not present

## 2013-03-08 DIAGNOSIS — K651 Peritoneal abscess: Secondary | ICD-10-CM | POA: Diagnosis not present

## 2013-03-08 DIAGNOSIS — T85898A Other specified complication of other internal prosthetic devices, implants and grafts, initial encounter: Secondary | ICD-10-CM | POA: Diagnosis not present

## 2013-03-08 DIAGNOSIS — R5381 Other malaise: Secondary | ICD-10-CM | POA: Diagnosis not present

## 2013-03-08 DIAGNOSIS — G4730 Sleep apnea, unspecified: Secondary | ICD-10-CM | POA: Diagnosis not present

## 2013-03-08 DIAGNOSIS — K750 Abscess of liver: Secondary | ICD-10-CM | POA: Diagnosis not present

## 2013-03-08 DIAGNOSIS — R110 Nausea: Secondary | ICD-10-CM | POA: Diagnosis not present

## 2013-03-08 DIAGNOSIS — K8309 Other cholangitis: Secondary | ICD-10-CM | POA: Diagnosis not present

## 2013-03-09 DIAGNOSIS — R5381 Other malaise: Secondary | ICD-10-CM | POA: Diagnosis not present

## 2013-03-09 DIAGNOSIS — Z79899 Other long term (current) drug therapy: Secondary | ICD-10-CM | POA: Diagnosis not present

## 2013-03-09 DIAGNOSIS — R110 Nausea: Secondary | ICD-10-CM | POA: Diagnosis not present

## 2013-03-09 DIAGNOSIS — I10 Essential (primary) hypertension: Secondary | ICD-10-CM | POA: Diagnosis not present

## 2013-03-09 DIAGNOSIS — I2510 Atherosclerotic heart disease of native coronary artery without angina pectoris: Secondary | ICD-10-CM | POA: Diagnosis not present

## 2013-03-09 DIAGNOSIS — K651 Peritoneal abscess: Secondary | ICD-10-CM | POA: Diagnosis not present

## 2013-03-09 DIAGNOSIS — G4733 Obstructive sleep apnea (adult) (pediatric): Secondary | ICD-10-CM | POA: Diagnosis not present

## 2013-03-09 DIAGNOSIS — I739 Peripheral vascular disease, unspecified: Secondary | ICD-10-CM | POA: Diagnosis not present

## 2013-03-13 DIAGNOSIS — K651 Peritoneal abscess: Secondary | ICD-10-CM | POA: Diagnosis not present

## 2013-03-13 DIAGNOSIS — Z9889 Other specified postprocedural states: Secondary | ICD-10-CM | POA: Diagnosis not present

## 2013-03-16 DIAGNOSIS — G4733 Obstructive sleep apnea (adult) (pediatric): Secondary | ICD-10-CM | POA: Diagnosis not present

## 2013-03-16 DIAGNOSIS — I739 Peripheral vascular disease, unspecified: Secondary | ICD-10-CM | POA: Diagnosis not present

## 2013-03-16 DIAGNOSIS — I2510 Atherosclerotic heart disease of native coronary artery without angina pectoris: Secondary | ICD-10-CM | POA: Diagnosis not present

## 2013-03-16 DIAGNOSIS — T8140XA Infection following a procedure, unspecified, initial encounter: Secondary | ICD-10-CM | POA: Diagnosis not present

## 2013-03-16 DIAGNOSIS — I10 Essential (primary) hypertension: Secondary | ICD-10-CM | POA: Diagnosis not present

## 2013-03-23 DIAGNOSIS — I10 Essential (primary) hypertension: Secondary | ICD-10-CM | POA: Diagnosis not present

## 2013-03-23 DIAGNOSIS — Z79899 Other long term (current) drug therapy: Secondary | ICD-10-CM | POA: Diagnosis not present

## 2013-03-23 DIAGNOSIS — I2510 Atherosclerotic heart disease of native coronary artery without angina pectoris: Secondary | ICD-10-CM | POA: Diagnosis not present

## 2013-03-23 DIAGNOSIS — G4733 Obstructive sleep apnea (adult) (pediatric): Secondary | ICD-10-CM | POA: Diagnosis not present

## 2013-03-23 DIAGNOSIS — I739 Peripheral vascular disease, unspecified: Secondary | ICD-10-CM | POA: Diagnosis not present

## 2013-03-24 DIAGNOSIS — J9819 Other pulmonary collapse: Secondary | ICD-10-CM | POA: Diagnosis not present

## 2013-03-24 DIAGNOSIS — E785 Hyperlipidemia, unspecified: Secondary | ICD-10-CM | POA: Diagnosis not present

## 2013-03-24 DIAGNOSIS — J9383 Other pneumothorax: Secondary | ICD-10-CM | POA: Diagnosis not present

## 2013-03-24 DIAGNOSIS — I2510 Atherosclerotic heart disease of native coronary artery without angina pectoris: Secondary | ICD-10-CM | POA: Diagnosis not present

## 2013-03-24 DIAGNOSIS — J90 Pleural effusion, not elsewhere classified: Secondary | ICD-10-CM | POA: Diagnosis not present

## 2013-03-24 DIAGNOSIS — R918 Other nonspecific abnormal finding of lung field: Secondary | ICD-10-CM | POA: Diagnosis not present

## 2013-03-24 DIAGNOSIS — Z79899 Other long term (current) drug therapy: Secondary | ICD-10-CM | POA: Diagnosis not present

## 2013-03-24 DIAGNOSIS — I10 Essential (primary) hypertension: Secondary | ICD-10-CM | POA: Diagnosis not present

## 2013-03-24 DIAGNOSIS — R109 Unspecified abdominal pain: Secondary | ICD-10-CM | POA: Diagnosis not present

## 2013-03-24 DIAGNOSIS — R509 Fever, unspecified: Secondary | ICD-10-CM | POA: Diagnosis not present

## 2013-03-24 DIAGNOSIS — K8309 Other cholangitis: Secondary | ICD-10-CM | POA: Diagnosis not present

## 2013-03-25 DIAGNOSIS — J90 Pleural effusion, not elsewhere classified: Secondary | ICD-10-CM | POA: Diagnosis not present

## 2013-03-25 DIAGNOSIS — J9819 Other pulmonary collapse: Secondary | ICD-10-CM | POA: Diagnosis not present

## 2013-03-25 DIAGNOSIS — R918 Other nonspecific abnormal finding of lung field: Secondary | ICD-10-CM | POA: Diagnosis not present

## 2013-03-27 DIAGNOSIS — R609 Edema, unspecified: Secondary | ICD-10-CM | POA: Diagnosis not present

## 2013-03-27 DIAGNOSIS — K651 Peritoneal abscess: Secondary | ICD-10-CM | POA: Diagnosis not present

## 2013-03-27 DIAGNOSIS — R918 Other nonspecific abnormal finding of lung field: Secondary | ICD-10-CM | POA: Diagnosis not present

## 2013-03-27 DIAGNOSIS — J90 Pleural effusion, not elsewhere classified: Secondary | ICD-10-CM | POA: Diagnosis not present

## 2013-03-27 DIAGNOSIS — Z9889 Other specified postprocedural states: Secondary | ICD-10-CM | POA: Diagnosis not present

## 2013-03-27 DIAGNOSIS — J9383 Other pneumothorax: Secondary | ICD-10-CM | POA: Diagnosis not present

## 2013-03-31 DIAGNOSIS — K651 Peritoneal abscess: Secondary | ICD-10-CM | POA: Diagnosis not present

## 2013-03-31 DIAGNOSIS — R109 Unspecified abdominal pain: Secondary | ICD-10-CM | POA: Diagnosis not present

## 2013-03-31 DIAGNOSIS — R110 Nausea: Secondary | ICD-10-CM | POA: Diagnosis not present

## 2013-03-31 DIAGNOSIS — Z9889 Other specified postprocedural states: Secondary | ICD-10-CM | POA: Diagnosis not present

## 2013-03-31 DIAGNOSIS — J90 Pleural effusion, not elsewhere classified: Secondary | ICD-10-CM | POA: Diagnosis not present

## 2013-03-31 DIAGNOSIS — R609 Edema, unspecified: Secondary | ICD-10-CM | POA: Diagnosis not present

## 2013-04-03 DIAGNOSIS — I739 Peripheral vascular disease, unspecified: Secondary | ICD-10-CM | POA: Diagnosis not present

## 2013-04-03 DIAGNOSIS — I10 Essential (primary) hypertension: Secondary | ICD-10-CM | POA: Diagnosis not present

## 2013-04-03 DIAGNOSIS — I2510 Atherosclerotic heart disease of native coronary artery without angina pectoris: Secondary | ICD-10-CM | POA: Diagnosis not present

## 2013-04-03 DIAGNOSIS — G4733 Obstructive sleep apnea (adult) (pediatric): Secondary | ICD-10-CM | POA: Diagnosis not present

## 2013-04-17 DIAGNOSIS — J90 Pleural effusion, not elsewhere classified: Secondary | ICD-10-CM | POA: Diagnosis not present

## 2013-04-17 DIAGNOSIS — R110 Nausea: Secondary | ICD-10-CM | POA: Diagnosis not present

## 2013-04-17 DIAGNOSIS — R197 Diarrhea, unspecified: Secondary | ICD-10-CM | POA: Diagnosis not present

## 2013-04-17 DIAGNOSIS — K651 Peritoneal abscess: Secondary | ICD-10-CM | POA: Diagnosis not present

## 2013-05-15 DIAGNOSIS — K831 Obstruction of bile duct: Secondary | ICD-10-CM | POA: Diagnosis not present

## 2013-05-15 DIAGNOSIS — K8309 Other cholangitis: Secondary | ICD-10-CM | POA: Diagnosis not present

## 2013-06-18 ENCOUNTER — Other Ambulatory Visit: Payer: Self-pay | Admitting: Dermatology

## 2013-06-18 DIAGNOSIS — D239 Other benign neoplasm of skin, unspecified: Secondary | ICD-10-CM | POA: Diagnosis not present

## 2013-06-18 DIAGNOSIS — D2330 Other benign neoplasm of skin of unspecified part of face: Secondary | ICD-10-CM | POA: Diagnosis not present

## 2013-06-18 DIAGNOSIS — L570 Actinic keratosis: Secondary | ICD-10-CM | POA: Diagnosis not present

## 2013-06-18 DIAGNOSIS — Z85828 Personal history of other malignant neoplasm of skin: Secondary | ICD-10-CM | POA: Diagnosis not present

## 2013-06-18 DIAGNOSIS — L821 Other seborrheic keratosis: Secondary | ICD-10-CM | POA: Diagnosis not present

## 2013-06-18 DIAGNOSIS — Z85820 Personal history of malignant melanoma of skin: Secondary | ICD-10-CM | POA: Diagnosis not present

## 2013-06-18 DIAGNOSIS — D485 Neoplasm of uncertain behavior of skin: Secondary | ICD-10-CM | POA: Diagnosis not present

## 2013-07-28 DIAGNOSIS — R1084 Generalized abdominal pain: Secondary | ICD-10-CM | POA: Diagnosis not present

## 2013-07-28 DIAGNOSIS — K651 Peritoneal abscess: Secondary | ICD-10-CM | POA: Diagnosis not present

## 2013-07-28 DIAGNOSIS — K8309 Other cholangitis: Secondary | ICD-10-CM | POA: Diagnosis not present

## 2013-07-28 DIAGNOSIS — R509 Fever, unspecified: Secondary | ICD-10-CM | POA: Diagnosis not present

## 2013-07-29 ENCOUNTER — Other Ambulatory Visit (HOSPITAL_COMMUNITY): Payer: Self-pay | Admitting: Internal Medicine

## 2013-07-29 ENCOUNTER — Ambulatory Visit (HOSPITAL_COMMUNITY)
Admission: RE | Admit: 2013-07-29 | Discharge: 2013-07-29 | Disposition: A | Discharge status: Home / Self Care | Payer: Medicare Other | Source: Ambulatory Visit | Attending: Internal Medicine | Admitting: Internal Medicine

## 2013-07-29 DIAGNOSIS — R197 Diarrhea, unspecified: Secondary | ICD-10-CM | POA: Insufficient documentation

## 2013-07-29 DIAGNOSIS — R509 Fever, unspecified: Secondary | ICD-10-CM | POA: Diagnosis not present

## 2013-07-29 DIAGNOSIS — N400 Enlarged prostate without lower urinary tract symptoms: Secondary | ICD-10-CM | POA: Diagnosis not present

## 2013-07-29 DIAGNOSIS — J90 Pleural effusion, not elsewhere classified: Secondary | ICD-10-CM | POA: Diagnosis not present

## 2013-07-29 DIAGNOSIS — I723 Aneurysm of iliac artery: Secondary | ICD-10-CM | POA: Diagnosis not present

## 2013-07-29 DIAGNOSIS — K7460 Unspecified cirrhosis of liver: Secondary | ICD-10-CM

## 2013-07-29 DIAGNOSIS — K651 Peritoneal abscess: Secondary | ICD-10-CM | POA: Diagnosis not present

## 2013-07-29 DIAGNOSIS — K5730 Diverticulosis of large intestine without perforation or abscess without bleeding: Secondary | ICD-10-CM | POA: Insufficient documentation

## 2013-07-29 DIAGNOSIS — R109 Unspecified abdominal pain: Secondary | ICD-10-CM | POA: Diagnosis not present

## 2013-07-29 DIAGNOSIS — R634 Abnormal weight loss: Secondary | ICD-10-CM | POA: Diagnosis not present

## 2013-07-29 DIAGNOSIS — K8309 Other cholangitis: Secondary | ICD-10-CM | POA: Diagnosis not present

## 2013-07-29 DIAGNOSIS — Z7682 Awaiting organ transplant status: Secondary | ICD-10-CM

## 2013-07-29 DIAGNOSIS — R1084 Generalized abdominal pain: Secondary | ICD-10-CM | POA: Diagnosis not present

## 2013-07-30 ENCOUNTER — Ambulatory Visit (HOSPITAL_COMMUNITY): Admission: RE | Admit: 2013-07-30 | Payer: Medicare Other | Source: Ambulatory Visit

## 2013-07-31 DIAGNOSIS — K8309 Other cholangitis: Secondary | ICD-10-CM | POA: Diagnosis not present

## 2013-07-31 DIAGNOSIS — R109 Unspecified abdominal pain: Secondary | ICD-10-CM | POA: Diagnosis not present

## 2013-07-31 DIAGNOSIS — R110 Nausea: Secondary | ICD-10-CM | POA: Diagnosis not present

## 2013-07-31 DIAGNOSIS — K651 Peritoneal abscess: Secondary | ICD-10-CM | POA: Diagnosis not present

## 2013-07-31 DIAGNOSIS — K831 Obstruction of bile duct: Secondary | ICD-10-CM | POA: Diagnosis not present

## 2013-09-15 DIAGNOSIS — R109 Unspecified abdominal pain: Secondary | ICD-10-CM | POA: Diagnosis not present

## 2013-09-15 DIAGNOSIS — R197 Diarrhea, unspecified: Secondary | ICD-10-CM | POA: Diagnosis not present

## 2013-09-15 DIAGNOSIS — R509 Fever, unspecified: Secondary | ICD-10-CM | POA: Diagnosis not present

## 2013-11-02 DIAGNOSIS — N400 Enlarged prostate without lower urinary tract symptoms: Secondary | ICD-10-CM | POA: Diagnosis not present

## 2013-11-02 DIAGNOSIS — N320 Bladder-neck obstruction: Secondary | ICD-10-CM | POA: Diagnosis not present

## 2013-11-02 DIAGNOSIS — R972 Elevated prostate specific antigen [PSA]: Secondary | ICD-10-CM | POA: Diagnosis not present

## 2013-11-02 DIAGNOSIS — N529 Male erectile dysfunction, unspecified: Secondary | ICD-10-CM | POA: Diagnosis not present

## 2013-11-09 ENCOUNTER — Encounter (INDEPENDENT_AMBULATORY_CARE_PROVIDER_SITE_OTHER): Payer: Self-pay | Admitting: *Deleted

## 2013-11-09 DIAGNOSIS — N400 Enlarged prostate without lower urinary tract symptoms: Secondary | ICD-10-CM | POA: Diagnosis present

## 2013-11-09 DIAGNOSIS — K5730 Diverticulosis of large intestine without perforation or abscess without bleeding: Secondary | ICD-10-CM | POA: Diagnosis not present

## 2013-11-09 DIAGNOSIS — J449 Chronic obstructive pulmonary disease, unspecified: Secondary | ICD-10-CM | POA: Diagnosis present

## 2013-11-09 DIAGNOSIS — R509 Fever, unspecified: Secondary | ICD-10-CM | POA: Diagnosis not present

## 2013-11-09 DIAGNOSIS — R109 Unspecified abdominal pain: Secondary | ICD-10-CM | POA: Diagnosis not present

## 2013-11-09 DIAGNOSIS — K219 Gastro-esophageal reflux disease without esophagitis: Secondary | ICD-10-CM | POA: Diagnosis present

## 2013-11-09 DIAGNOSIS — M949 Disorder of cartilage, unspecified: Secondary | ICD-10-CM | POA: Diagnosis present

## 2013-11-09 DIAGNOSIS — Z87891 Personal history of nicotine dependence: Secondary | ICD-10-CM | POA: Diagnosis not present

## 2013-11-09 DIAGNOSIS — I10 Essential (primary) hypertension: Secondary | ICD-10-CM | POA: Diagnosis present

## 2013-11-09 DIAGNOSIS — E785 Hyperlipidemia, unspecified: Secondary | ICD-10-CM | POA: Diagnosis present

## 2013-11-09 DIAGNOSIS — R1084 Generalized abdominal pain: Secondary | ICD-10-CM | POA: Diagnosis not present

## 2013-11-09 DIAGNOSIS — M899 Disorder of bone, unspecified: Secondary | ICD-10-CM | POA: Diagnosis present

## 2013-11-09 DIAGNOSIS — A419 Sepsis, unspecified organism: Secondary | ICD-10-CM | POA: Diagnosis not present

## 2013-11-09 DIAGNOSIS — D696 Thrombocytopenia, unspecified: Secondary | ICD-10-CM | POA: Diagnosis not present

## 2013-11-09 DIAGNOSIS — K5289 Other specified noninfective gastroenteritis and colitis: Secondary | ICD-10-CM | POA: Diagnosis present

## 2013-11-09 DIAGNOSIS — I2510 Atherosclerotic heart disease of native coronary artery without angina pectoris: Secondary | ICD-10-CM | POA: Diagnosis not present

## 2013-11-20 ENCOUNTER — Ambulatory Visit (HOSPITAL_BASED_OUTPATIENT_CLINIC_OR_DEPARTMENT_OTHER): Payer: Medicare Other | Admitting: Oncology

## 2013-11-20 ENCOUNTER — Telehealth: Payer: Self-pay | Admitting: Oncology

## 2013-11-20 ENCOUNTER — Other Ambulatory Visit (HOSPITAL_BASED_OUTPATIENT_CLINIC_OR_DEPARTMENT_OTHER): Payer: Medicare Other

## 2013-11-20 ENCOUNTER — Encounter: Payer: Self-pay | Admitting: Oncology

## 2013-11-20 VITALS — BP 130/75 | HR 57 | Temp 97.5°F | Resp 17 | Ht 71.0 in | Wt 182.6 lb

## 2013-11-20 DIAGNOSIS — K745 Biliary cirrhosis, unspecified: Secondary | ICD-10-CM | POA: Diagnosis not present

## 2013-11-20 DIAGNOSIS — M899 Disorder of bone, unspecified: Secondary | ICD-10-CM

## 2013-11-20 DIAGNOSIS — M949 Disorder of cartilage, unspecified: Secondary | ICD-10-CM

## 2013-11-20 DIAGNOSIS — Q782 Osteopetrosis: Secondary | ICD-10-CM

## 2013-11-20 DIAGNOSIS — D696 Thrombocytopenia, unspecified: Secondary | ICD-10-CM | POA: Diagnosis not present

## 2013-11-20 DIAGNOSIS — N400 Enlarged prostate without lower urinary tract symptoms: Secondary | ICD-10-CM | POA: Diagnosis not present

## 2013-11-20 DIAGNOSIS — C61 Malignant neoplasm of prostate: Secondary | ICD-10-CM

## 2013-11-20 LAB — CBC WITH DIFFERENTIAL/PLATELET
BASO%: 0.6 % (ref 0.0–2.0)
Basophils Absolute: 0 10*3/uL (ref 0.0–0.1)
EOS ABS: 0.1 10*3/uL (ref 0.0–0.5)
EOS%: 1.3 % (ref 0.0–7.0)
HCT: 34.3 % — ABNORMAL LOW (ref 38.4–49.9)
HGB: 10.7 g/dL — ABNORMAL LOW (ref 13.0–17.1)
LYMPH%: 17.1 % (ref 14.0–49.0)
MCH: 26.8 pg — AB (ref 27.2–33.4)
MCHC: 31.3 g/dL — ABNORMAL LOW (ref 32.0–36.0)
MCV: 85.6 fL (ref 79.3–98.0)
MONO#: 0.3 10*3/uL (ref 0.1–0.9)
MONO%: 6.5 % (ref 0.0–14.0)
NEUT#: 3.6 10*3/uL (ref 1.5–6.5)
NEUT%: 74.5 % (ref 39.0–75.0)
PLATELETS: 133 10*3/uL — AB (ref 140–400)
RBC: 4.01 10*6/uL — AB (ref 4.20–5.82)
RDW: 17.2 % — ABNORMAL HIGH (ref 11.0–14.6)
WBC: 4.9 10*3/uL (ref 4.0–10.3)
lymph#: 0.8 10*3/uL — ABNORMAL LOW (ref 0.9–3.3)

## 2013-11-20 LAB — COMPREHENSIVE METABOLIC PANEL (CC13)
ALK PHOS: 330 U/L — AB (ref 40–150)
ALT: 46 U/L (ref 0–55)
AST: 40 U/L — ABNORMAL HIGH (ref 5–34)
Albumin: 3 g/dL — ABNORMAL LOW (ref 3.5–5.0)
Anion Gap: 7 mEq/L (ref 3–11)
BILIRUBIN TOTAL: 1.18 mg/dL (ref 0.20–1.20)
BUN: 16 mg/dL (ref 7.0–26.0)
CO2: 28 mEq/L (ref 22–29)
Calcium: 8.6 mg/dL (ref 8.4–10.4)
Chloride: 110 mEq/L — ABNORMAL HIGH (ref 98–109)
Creatinine: 0.8 mg/dL (ref 0.7–1.3)
Glucose: 78 mg/dl (ref 70–140)
Potassium: 4.3 mEq/L (ref 3.5–5.1)
SODIUM: 144 meq/L (ref 136–145)
TOTAL PROTEIN: 5.8 g/dL — AB (ref 6.4–8.3)

## 2013-11-20 NOTE — Telephone Encounter (Signed)
gv adn printed appt sched and avs for pt for Dec °

## 2013-11-20 NOTE — Progress Notes (Signed)
Hematology and Oncology Follow Up Visit  Alexander Burton [id number] 10-11-1940 73 y.o. 11/20/2013 9:56 AM   Principle Diagnosis: 33 -year-old with a history of prostatic enlargement and possible sclerotic bony lesions rule out metastatic malignancy vs. Benign etiology. He also has chronic cholangitis associated with a chronic biliary obstruction. This finding dates back to October of 2012 and followup bone scans failed to reveal clear-cut malignancy.   Interim History: Alexander Burton presents today for a followup visit. He is a pleasant gentleman with a rather complex history of recurrent cholangitis and had an incidental finding of sclerotic bony lesions. For the last 2 years, following bone scans failed to show any changes suggesting a benign etiology. Since his last visit, Alexander Burton had a hepatic resection in September of 2015 at [facility]. The pathology showed chronic cholangitis and inflammation without any malignancy. He was doing fairly well till last week where he had an episode of sepsis and required hospitalization at an outside facility. He also had a CT scan of the abdomen and pelvis which documented the previous sclerotic lesions. This treated with intravenous antibiotics and recovered in 2 days. clinically is feeling well is not reporting any abdominal pain is not reporting any fevers or chills is not reporting any increase in his lower back pain he does not have any neurological symptoms does not have any paresthesias or anesthesia is. He did not have any change in his bowel habits overall he is regaining most activities of daily living. He has not reported any bone pain at this time.  Medications: I have reviewed the patient's current medications.  Allergies:  Allergies  Allergen Reactions  . Contrast Media [Iodinated Diagnostic Agents]     Hives, needs pre meds  . Other Itching and Other (See Comments)    Contrast dye erythema     Review of  Systems: Constitutional:  Negative for fever, chills, night sweats, anorexia, weight loss, pain. Cardiovascular: no chest pain or dyspnea on exertion Respiratory: no cough, shortness of breath, or wheezing Gastrointestinal: no abdominal pain, change in bowel habits, or black or bloody stools Genito-Urinary: no dysuria, trouble voiding, or hematuria  Remaining ROS negative.  Physical Exam: Blood pressure 130/75, pulse 57, temperature 97.5 F (36.4 C), temperature source Oral, resp. rate 17, height 5\' 11"  (1.803 m), weight 182 lb 9.6 oz (82.827 kg), SpO2 99.00%. ECOG: 1 General appearance: alert and awake not in any distress. Head: Normocephalic, without obvious abnormality Neck: no adenopathy Lymph nodes: Cervical, supraclavicular, and axillary nodes normal. Heart:regular rate and rhythm, no murmurs or gallops Lung:chest clear, no wheezing, rales, normal symmetric air entry. Abdomin: Soft and nontender no hepatosplenomegaly or EXT:no erythema, induration, or nodules   Lab Results: Lab Results  Component Value Date   WBC 4.9 11/20/2013   HGB 10.7* 11/20/2013   HCT 34.3* 11/20/2013   MCV 85.6 11/20/2013   PLT 133* 11/20/2013     Chemistry      Component Value Date/Time   NA 144 11/20/2013 0911   NA 142 12/14/2011 1546   K 4.3 11/20/2013 0911   K 4.3 12/14/2011 1546   CL 109* 11/19/2012 1044   CL 106 12/14/2011 1546   CO2 28 11/20/2013 0911   CO2 29 12/14/2011 1546   BUN 16.0 11/20/2013 0911   BUN 20 12/14/2011 1546   CREATININE 0.8 11/20/2013 0911   CREATININE 0.94 12/14/2011 1546   CREATININE 0.89 08/23/2011 1139      Component Value Date/Time   CALCIUM  8.6 11/20/2013 0911   CALCIUM 8.8 12/14/2011 1546   ALKPHOS 330* 11/20/2013 0911   ALKPHOS 523* 12/31/2011 1736   AST 40* 11/20/2013 0911   AST 51* 12/31/2011 1736   ALT 46 11/20/2013 0911   ALT 67* 12/31/2011 1736   BILITOT 1.18 11/20/2013 0911   BILITOT 0.7 12/31/2011 1736      CT ABDOMEN AND PELVIS WITHOUT CONTRAST  TECHNIQUE:   Multidetector CT imaging of the abdomen and pelvis was performed  following the standard protocol without intravenous contrast.  COMPARISON: This CT abdomen 02/28/2012.  FINDINGS:  The patient has a small to moderate right pleural effusion. There is  no left pleural effusion or pericardial effusion. Lung bases are  emphysematous. A 0.6 cm right lower lobe nodule is unchanged since  the 2012 exam.  The patient is status post resection of the right hepatic lobe. The  gallbladder has been removed. The adrenal glands, pancreas and right  kidney are unremarkable. There is an unchanged left renal cyst. 1.7  cm left common iliac artery aneurysm is unchanged. The descending  aorta is ectatic. Scattered atherosclerosis is noted. The prostate  gland is enlarged. Extensive sigmoid diverticulosis without  diverticulitis is identified. The appendix is unremarkable. Surgical  anastomosis in bowel of the right upper quadrant is identified.  There is no bowel obstruction or evidence of bowel ischemia. No  lymphadenopathy is seen.  Bones demonstrate multiple sclerotic lesions in the spine and pelvis  which do not appear notably changed.  IMPRESSION:  No acute finding in the abdomen or pelvis.  Small to moderate right pleural effusion.  Status post right hepatectomy without complicating feature.  No change in scattered sclerotic bony lesions.  No change in a 1.7 cm right common iliac artery aneurysm.  Prostatomegaly.  Sigmoid diverticulosis without diverticulitis.     Impression and Plan: 73 year old gentleman with the following issues:  1. Questionable sclerotic bony lesions in the spine that could be related to malignancy versus benign bone findings. I have personally reviewed these imaging studies including bone scans dating back to 2013 and the most recent CT scan of the abdomen on March of 2015. It is unclear to me the etiology of these lesions but I think it's reasonable to repeat the bone  scan. If the bone scan shows stable findings and will continue observation. If it shows progression of these lesions, we will consider a biopsy.  2. Recurrent cholangitis: He is status post hepatectomy in September of 2015 but he continues to have recurrent episodes of cholangitis. He is following up at [facility]  3. Weakness/fever/ sweats. This is likely due to cholangitis.  4. Thrombocytopenia: It was noted that his platelet counts trachea down to around 50,000 but that was associated with sepsis. His blood counts now back to normal.  5. Followup: Will be in 6 months sooner if his bone scan should any abnormalities    SHADAD,FIRAS, MD 6/26/20159:56 AM

## 2013-11-24 DIAGNOSIS — R509 Fever, unspecified: Secondary | ICD-10-CM | POA: Diagnosis not present

## 2013-11-24 DIAGNOSIS — K8309 Other cholangitis: Secondary | ICD-10-CM | POA: Diagnosis not present

## 2013-11-24 DIAGNOSIS — K831 Obstruction of bile duct: Secondary | ICD-10-CM | POA: Diagnosis not present

## 2013-11-26 DIAGNOSIS — R945 Abnormal results of liver function studies: Secondary | ICD-10-CM | POA: Diagnosis not present

## 2013-11-26 DIAGNOSIS — A419 Sepsis, unspecified organism: Secondary | ICD-10-CM | POA: Diagnosis not present

## 2013-11-26 DIAGNOSIS — N401 Enlarged prostate with lower urinary tract symptoms: Secondary | ICD-10-CM | POA: Diagnosis not present

## 2013-11-26 DIAGNOSIS — N138 Other obstructive and reflux uropathy: Secondary | ICD-10-CM | POA: Diagnosis not present

## 2013-11-26 DIAGNOSIS — R7881 Bacteremia: Secondary | ICD-10-CM | POA: Diagnosis not present

## 2013-12-07 ENCOUNTER — Ambulatory Visit (INDEPENDENT_AMBULATORY_CARE_PROVIDER_SITE_OTHER): Payer: Medicare Other | Admitting: Cardiology

## 2013-12-07 ENCOUNTER — Encounter: Payer: Self-pay | Admitting: Cardiology

## 2013-12-07 VITALS — BP 122/68 | HR 54 | Ht 71.0 in | Wt 179.4 lb

## 2013-12-07 DIAGNOSIS — Z9861 Coronary angioplasty status: Secondary | ICD-10-CM | POA: Diagnosis not present

## 2013-12-07 DIAGNOSIS — I10 Essential (primary) hypertension: Secondary | ICD-10-CM | POA: Diagnosis not present

## 2013-12-07 DIAGNOSIS — R634 Abnormal weight loss: Secondary | ICD-10-CM

## 2013-12-07 DIAGNOSIS — I2510 Atherosclerotic heart disease of native coronary artery without angina pectoris: Secondary | ICD-10-CM

## 2013-12-07 DIAGNOSIS — R7881 Bacteremia: Secondary | ICD-10-CM

## 2013-12-07 DIAGNOSIS — E785 Hyperlipidemia, unspecified: Secondary | ICD-10-CM

## 2013-12-07 DIAGNOSIS — I4891 Unspecified atrial fibrillation: Secondary | ICD-10-CM | POA: Diagnosis not present

## 2013-12-07 DIAGNOSIS — I480 Paroxysmal atrial fibrillation: Secondary | ICD-10-CM

## 2013-12-07 DIAGNOSIS — K750 Abscess of liver: Secondary | ICD-10-CM

## 2013-12-07 NOTE — Patient Instructions (Signed)
[place] X2  Your physician has requested that you have an echocardiogram. Echocardiography is a painless test that uses sound waves to create images of your heart. It provides your doctor with information about the size and shape of your heart and how well your heart's chambers and valves are working. This procedure takes approximately one hour. There are no restrictions for this procedure.    Your physician wants you to follow-up iN 6 MONTH DR HARDING 30 MIN APPT. You will receive a reminder letter in the mail two months in advance. If you don't receive a letter, please call our office to schedule the follow-up appointment.

## 2013-12-09 ENCOUNTER — Encounter: Payer: Self-pay | Admitting: Cardiology

## 2013-12-09 ENCOUNTER — Other Ambulatory Visit: Payer: Self-pay | Admitting: Cardiology

## 2013-12-09 DIAGNOSIS — E785 Hyperlipidemia, unspecified: Secondary | ICD-10-CM | POA: Insufficient documentation

## 2013-12-09 DIAGNOSIS — R7881 Bacteremia: Secondary | ICD-10-CM | POA: Insufficient documentation

## 2013-12-09 NOTE — Assessment & Plan Note (Signed)
I have a concern that he had enterococcal bacteremia and of unknown source with intermittent episodes of febrile illnesses. Clearly we need to start looking for a possible source or abscess. At this point it is prudent to evaluate for possible endocarditis as an abscess. He currently feels fine, but his last few month history is concerning.   Plan: Check blood cultures x2 to ensure negative ; check 2-D echocardiogram to evaluate for possible valve vegetation; if either cultures are positive or the echocardiogram is abnormal, would have a low threshold for proceeding with TEE.

## 2013-12-09 NOTE — Assessment & Plan Note (Signed)
His weight is remaining relatively stable although he is still more pounds now from last year. This could be simply related to his current illnesses, however with her being concerned of possible abscess, chronic illness is a possibility.

## 2013-12-09 NOTE — Assessment & Plan Note (Signed)
By report he had a CT scan done of his abdomen that did not show any recurrent liver abscess. Of course this is most obvious source for bacteremia, however when enterococcal bacteremia, checking for possible endocarditis is pertinent.

## 2013-12-09 NOTE — Assessment & Plan Note (Signed)
On statin. Labs are followed by PCP. 

## 2013-12-09 NOTE — Assessment & Plan Note (Signed)
Well-controlled on current meds 

## 2013-12-09 NOTE — Assessment & Plan Note (Signed)
No anginal symptoms. Actually did well despite being systemically ill. He is on beta blocker as well as aspirin and statin.

## 2013-12-09 NOTE — Telephone Encounter (Signed)
Rx was sent to pharmacy electronically. 

## 2013-12-09 NOTE — Progress Notes (Signed)
PCP: [name], MD  Clinic Note: Chief Complaint  Patient presents with  . Annual Exam    Sept 2014 - 1/2 liver removed/bile duct reconstruction surgery - denies chest pain/SOB - reports bilateral LE edema - has episodes about every 6-8 weeks has temp spike, low HR (treated for sepsis while in [facility], took Cipro)   HPI: Alexander Burton is a 73 y.o. male with a Cardiovascular Problem List below who presents today for one-year followup. I last saw him back in July of 2014. He was doing relatively well from cardiac standpoint, but was having issues related to his gallbladder surgery years ago. He ended up developing a hepatic abscess that led to sepsis and he was transferred to [facility] for treatment.  Interval History: Even though relatively well since I last saw him, but while down in [facility] on a vacation, he started having fevers and chills and nausea and upset stomach. Unit up being admitted to the hospital ward he was treated for sepsis with ciprofloxacin. She was found to have enterococcal bacteremia and was treated with antibiotics. It seems like he has had about 4 episodes not quite to the extent of what he had this time  in January, March and then twice in June. He has been having intermittent hot flushes and chills and abdominal pain with nausea. Each episode lasts a couple days but the one we went in [facility] was of slightly longer episode of close to a week. No source of infection was ever identified.  Despite him being very sick, for cardiac standpoint he was relatively asymptomatic. Denied any chest tightness pressure less exertion or with while sick and septic. He had no pole of heart failure during his hospitalization or outside. No PND, orthopnea or edema. No rapid or heartbeats with the exception of when he does have these hot flushes he feels his heart racing.  He has not had any syncope or near syncope symptoms, no TIA or amaurosis fugax symptoms. He has not had  any melena, hematochezia or dysuria. No rashes or lesions.  Past Medical History  Diagnosis Date  . Prostate ca   . BPH (benign prostatic hyperplasia)   . Gastritis   . Osteoarthritis   . Melanoma     sigmoid diverticulosis  . Goiter   . Colon polyps   . CAD (coronary artery disease), native coronary artery February 2010    mid LAD lesion: PCI with 3.5 mm x 35 mm Cypher DES postdilated to 4.0 mm;   . Hyperlipidemia with target LDL less than 70     For CAD  . PAF (paroxysmal atrial fibrillation)   . COPD (chronic obstructive pulmonary disease)   . Syncope and collapse January 2010    PVC's and14 beats NSVT as well as atrial afib  . Sleep apnea 04/02/2005    nocturnal polysomogram ordered K.Clance  . 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 Presence of drug coated stent in LAD coronary artery     Prior Cardiac Evaluation and Past Surgical History: Past Surgical History  Procedure Laterality Date  . Cholecystectomy      complicated by rupture of common bile duct and other vascular structures. Has now had multiple issues with chronic hepatic abscess. Currently has indwelling drain  . Surgical resection, melanoma    . Colonoscopy  08/16/2011    Procedure: COLONOSCOPY;  Surgeon: Rogene Houston, MD;  Location: [facility];  Service: Endoscopy;  Laterality: N/A;  1200  . Cardiac catheterization  Feb 2010    post DES to LAD ,3.5 X 33 mm Cypher stent,dilated up to 3.8 mm 80 to90% stenosis btwn first and second diag branc w.moderate disease EF 50%  . Doppler echocardiography  JAN 2010    EF 50 to 55% w/boderline concentic LVH,mildly dilated atrium ,frequent PVC  9METS  . Nm myocar perf wall motion  06/09/2008    EF 99% LV systolic fx mildly reduced.Ventricular fx preserved.  Modena Nunnery doppler  07/28/2008    NORMAL RGT GROIN DUPLEX DOPPLER  . Cardionet monitor  06/08/2008-06/24/2008    MEDICATIONS AND ALLERGIES REVIEWED IN  EPIC No Change in Social and Family History  ROS: A comprehensive Review of Systems - Notable positives per history of present illness; all other review of systems negative. Recurrent episodes of right years night sweats chills as noted above.  Wt Readings from Last 3 Encounters:  12/07/13 179 lb 6.4 oz (81.375 kg)  11/20/13 182 lb 9.6 oz (82.827 kg)  11/25/12 184 lb 11.2 oz (83.779 kg)    PHYSICAL EXAM BP 122/68  Pulse 54  Ht 5\' 11"  (1.803 m)  Wt 179 lb 6.4 oz (81.375 kg)  BMI 25.03 kg/m2 General appearance: alert, cooperative, appears stated age, no distress and pleasant mood and affect. Anicteric. Well-nourished. Answers questions verbally. Significant thoracic kyphosis with barrel chest.  Neck: no carotid bruit and no JVD  Lungs: clear to auscultation bilaterally, normal percussion bilaterally and diminished /distant tubular breath sounds throughout  Heart: regular rate and rhythm, S1, S2 normal, no murmur, click, rub or gallop, normal apical impulse and intermittently there is a split S2  Abdomen: soft/NT/M.D. as and BS. He does have a drain from the right upper quadrant that is dressed with a dressing clean dry and intact.  Extremities: extremities normal, atraumatic, no cyanosis or edema, no edema, redness or tenderness in the calves or thighs and no ulcers, gangrene or trophic changes  Pulses: mildly diminished posterior tibial pulses bilaterally.  Neurologic: Grossly normal   Adult ECG Report  Rate: 54 ;  Rhythm: sinus bradycardia; low voltage but otherwise normal EKG  Recent Labs  notable labs from his hospitalization showed blood cultures positive for enterococcus faceum  ASSESSMENT / PLAN: Bacteremia - enterococcal I have a concern that he had enterococcal bacteremia and of unknown source with intermittent episodes of febrile illnesses. Clearly we need to start looking for a possible source or abscess. At this point it is prudent to evaluate for possible endocarditis as  an abscess. He currently feels fine, but his last few month history is concerning.   Plan: Check blood cultures x2 to ensure negative ; check 2-D echocardiogram to evaluate for possible valve vegetation; if either cultures are positive or the echocardiogram is abnormal, would have a low threshold for proceeding with TEE.   CAD S/P percutaneous coronary angioplasty No anginal symptoms. Actually did well despite being systemically ill. He is on beta blocker as well as aspirin and statin.  PAF (paroxysmal atrial fibrillation) Adult he has had any recurrent episodes. Nothing to suggest that he had any A. fib during his hospitalization for sepsis this time. Without any recurrence, I agree with not considering full anticoagulation.  Essential hypertension Well-controlled on current meds  Liver abscess By report he had a CT scan done of his abdomen that did not show any recurrent liver abscess. Of course this is most obvious source for bacteremia, however  when enterococcal bacteremia, checking for possible endocarditis is pertinent.  Weight loss, unintentional His weight is remaining relatively stable although he is still more pounds now from last year. This could be simply related to his current illnesses, however with her being concerned of possible abscess, chronic illness is a possibility.  Hyperlipidemia with target LDL less than 70 On statin. Labs are followed by PCP.    Orders Placed This Encounter  Procedures  . Culture, blood (single)  . Culture, blood (single)  . EKG 12-Lead  . 2D Echocardiogram without contrast    DX ENTRECOCCOUS -E- CLOI    Standing Status: Future     Number of Occurrences:      Standing Expiration Date: 12/07/2014    Order Specific Question:  Type of Echo    Answer:  Complete    Order Specific Question:  Where should this test be performed    Answer:  [facility]    Order Specific Question:  Reason for exam-Echo    Answer:  Other - See Comments  Section    Order Specific Question:  Reason for exam-Echo    Answer:  Fever 780.6   Meds ordered this encounter  Medications  . tamsulosin (FLOMAX) 0.4 MG CAPS capsule    Sig: Take 1 capsule by mouth daily.    Followup: 6 months  DAVID W. Ellyn Hack, M.D., M.S. Interventional Cardiologist [facility]

## 2013-12-09 NOTE — Assessment & Plan Note (Signed)
Adult he has had any recurrent episodes. Nothing to suggest that he had any A. fib during his hospitalization for sepsis this time. Without any recurrence, I agree with not considering full anticoagulation.

## 2013-12-10 ENCOUNTER — Telehealth: Payer: Self-pay | Admitting: Medical Oncology

## 2013-12-10 ENCOUNTER — Telehealth: Payer: Self-pay | Admitting: Oncology

## 2013-12-10 ENCOUNTER — Ambulatory Visit (HOSPITAL_COMMUNITY)
Admission: RE | Admit: 2013-12-10 | Discharge: 2013-12-10 | Disposition: A | Discharge status: Home / Self Care | Payer: Medicare Other | Source: Ambulatory Visit | Attending: Oncology | Admitting: Oncology

## 2013-12-10 ENCOUNTER — Encounter (HOSPITAL_COMMUNITY)
Admission: RE | Admit: 2013-12-10 | Discharge: 2013-12-10 | Disposition: A | Discharge status: Home / Self Care | Payer: Medicare Other | Source: Ambulatory Visit | Attending: Oncology | Admitting: Oncology

## 2013-12-10 DIAGNOSIS — C61 Malignant neoplasm of prostate: Secondary | ICD-10-CM | POA: Diagnosis not present

## 2013-12-10 DIAGNOSIS — M948X9 Other specified disorders of cartilage, unspecified sites: Secondary | ICD-10-CM | POA: Insufficient documentation

## 2013-12-10 DIAGNOSIS — C439 Malignant melanoma of skin, unspecified: Secondary | ICD-10-CM | POA: Diagnosis not present

## 2013-12-10 DIAGNOSIS — R7881 Bacteremia: Secondary | ICD-10-CM | POA: Diagnosis not present

## 2013-12-10 MED ORDER — TECHNETIUM TC 99M MEDRONATE IV KIT
27.0000 | PACK | Freq: Once | INTRAVENOUS | Status: AC | PRN
  Administered 2013-12-10: 27 via INTRAVENOUS

## 2013-12-10 NOTE — Telephone Encounter (Signed)
Per MD, informed patient of bone scan resulted with no changes. Denies questions at this time.

## 2013-12-10 NOTE — Telephone Encounter (Signed)
Message copied by Maxwell Marion on Thu Dec 10, 2013  4:40 PM ------      Message from: Wyatt Portela      Created: Thu Dec 10, 2013  4:24 PM       Please call him with the result of the bone scan: unchanged from last scans. No change is needed. ------

## 2013-12-10 NOTE — Telephone Encounter (Signed)
Pt confirmed labs per 06/25 POF, gave pt AVS.....kj

## 2013-12-15 ENCOUNTER — Ambulatory Visit (HOSPITAL_COMMUNITY)
Admission: RE | Admit: 2013-12-15 | Discharge: 2013-12-15 | Disposition: A | Discharge status: Home / Self Care | Payer: Medicare Other | Source: Ambulatory Visit | Attending: Cardiovascular Disease | Admitting: Cardiovascular Disease

## 2013-12-15 DIAGNOSIS — R7881 Bacteremia: Secondary | ICD-10-CM | POA: Insufficient documentation

## 2013-12-15 DIAGNOSIS — I059 Rheumatic mitral valve disease, unspecified: Secondary | ICD-10-CM

## 2013-12-15 NOTE — Progress Notes (Signed)
2D Echocardiogram Complete.  12/15/2013   Keyaria Lawson, RDCS  

## 2013-12-16 ENCOUNTER — Telehealth: Payer: Self-pay | Admitting: Cardiology

## 2013-12-16 LAB — CULTURE, BLOOD (SINGLE)
PRELIMINARY REPORT: NO GROWTH
Preliminary Report: NEGATIVE
Preliminary Report: NEGATIVE
Preliminary Report: NO GROWTH

## 2013-12-16 NOTE — Telephone Encounter (Signed)
[facility] called - Blood culture results came through incorrectly should have read no growth 5 days.  A new report will come through.  Will notify Dr. Ellyn Hack of correction and to look out for report.

## 2013-12-17 NOTE — Telephone Encounter (Signed)
Blood cultures and echo report given to wife.  Voiced understanding.

## 2013-12-17 NOTE — Telephone Encounter (Signed)
We can inform him that cultures are negative & Echo looked OK - no vegetation.  Leonie Man, MD

## 2013-12-21 DIAGNOSIS — N320 Bladder-neck obstruction: Secondary | ICD-10-CM | POA: Diagnosis not present

## 2013-12-21 DIAGNOSIS — N529 Male erectile dysfunction, unspecified: Secondary | ICD-10-CM | POA: Diagnosis not present

## 2013-12-21 DIAGNOSIS — N400 Enlarged prostate without lower urinary tract symptoms: Secondary | ICD-10-CM | POA: Diagnosis not present

## 2014-01-14 DIAGNOSIS — N320 Bladder-neck obstruction: Secondary | ICD-10-CM | POA: Diagnosis not present

## 2014-01-14 DIAGNOSIS — N529 Male erectile dysfunction, unspecified: Secondary | ICD-10-CM | POA: Diagnosis not present

## 2014-01-14 DIAGNOSIS — N400 Enlarged prostate without lower urinary tract symptoms: Secondary | ICD-10-CM | POA: Diagnosis not present

## 2014-02-09 ENCOUNTER — Encounter (INDEPENDENT_AMBULATORY_CARE_PROVIDER_SITE_OTHER): Payer: Self-pay | Admitting: *Deleted

## 2014-02-09 ENCOUNTER — Ambulatory Visit (INDEPENDENT_AMBULATORY_CARE_PROVIDER_SITE_OTHER): Payer: Medicare Other | Admitting: Internal Medicine

## 2014-02-09 ENCOUNTER — Encounter (INDEPENDENT_AMBULATORY_CARE_PROVIDER_SITE_OTHER): Payer: Self-pay | Admitting: Internal Medicine

## 2014-02-09 VITALS — BP 110/68 | HR 74 | Temp 97.1°F | Resp 18 | Ht 71.0 in | Wt 182.9 lb

## 2014-02-09 DIAGNOSIS — R945 Abnormal results of liver function studies: Secondary | ICD-10-CM

## 2014-02-09 DIAGNOSIS — I2510 Atherosclerotic heart disease of native coronary artery without angina pectoris: Secondary | ICD-10-CM | POA: Diagnosis not present

## 2014-02-09 DIAGNOSIS — R1310 Dysphagia, unspecified: Secondary | ICD-10-CM

## 2014-02-09 DIAGNOSIS — Z9861 Coronary angioplasty status: Secondary | ICD-10-CM | POA: Diagnosis not present

## 2014-02-09 DIAGNOSIS — R7989 Other specified abnormal findings of blood chemistry: Secondary | ICD-10-CM

## 2014-02-09 LAB — HEPATIC FUNCTION PANEL
ALBUMIN: 3.7 g/dL (ref 3.5–5.2)
ALT: 22 U/L (ref 0–53)
AST: 29 U/L (ref 0–37)
Alkaline Phosphatase: 166 U/L — ABNORMAL HIGH (ref 39–117)
BILIRUBIN TOTAL: 1.1 mg/dL (ref 0.2–1.2)
Bilirubin, Direct: 0.2 mg/dL (ref 0.0–0.3)
Indirect Bilirubin: 0.9 mg/dL (ref 0.2–1.2)
Total Protein: 5.9 g/dL — ABNORMAL LOW (ref 6.0–8.3)

## 2014-02-09 LAB — CBC
HCT: 36.9 % — ABNORMAL LOW (ref 39.0–52.0)
Hemoglobin: 12.1 g/dL — ABNORMAL LOW (ref 13.0–17.0)
MCH: 26.8 pg (ref 26.0–34.0)
MCHC: 32.8 g/dL (ref 30.0–36.0)
MCV: 81.8 fL (ref 78.0–100.0)
PLATELETS: 115 10*3/uL — AB (ref 150–400)
RBC: 4.51 MIL/uL (ref 4.22–5.81)
RDW: 16.5 % — AB (ref 11.5–15.5)
WBC: 4.8 10*3/uL (ref 4.0–10.5)

## 2014-02-09 LAB — SEDIMENTATION RATE: SED RATE: 13 mm/h (ref 0–16)

## 2014-02-09 NOTE — Progress Notes (Signed)
Presenting complaint;  Recent history of febrile illness. Dysphagia.  History of present illness;  Patient is 73 year old Caucasian male who has a remote history of bile duct injury leading to hepaticojejunostomy. He was last seen in August 2013 for recurrent cholangitis unresponsive to percutaneous dilation of stricture and eventually went on to have resection of right hepatic lobe one year ago at [facility]. He did fine until January this year when he began to have fever. He had 2 episodes of fever in January once in March and then again 2 months ago when he developed temp of over 104 with delirium and was hospitalized in [place]. He was treated for sepsis but cultures remained negative. He was subsequent seen by Dr. Colonel Bald of [facility] and underwent CT and MRCP which are unremarkable. His illness was felt not to be related to biliary tract. He was then evaluated by cardiology and echo was negative and similarly urologic evaluation was also negative. Patient was also evaluated by Dr. Alen Blew Re: second rod bone lesions seen on abdominopelvic CT of 11/09/2013. He underwent bone scan which is felt to be negative for metastatic disease. Patient has remained afebrile for 3 months. His wife is concerned that his symptoms will happen again. Presently he is free of abdominal pain. He has good appetite. Bowels move daily but every other day. He may have occasional constipation. He denies melena or rectal bleeding. He states he lost 12 pounds with his acute illness 3 months ago in he's gained most of the weight back. He reports intermittent dysphagia with solids. He's been having dysphagia for about 2 years. He points to the suprasternal area as site of bolus obstruction. He underwent EGD with ED in December 2009 for symptomatic relief. Review of the systems is negative for skin rash joint pains headache cough chest pain shortness of breath nausea or vomiting.  Current Medications: Outpatient  Encounter Prescriptions as of 02/09/2014  Medication Sig  . aspirin 81 MG tablet Take 81 mg by mouth daily.  Marland Kitchen esomeprazole (NEXIUM) 40 MG capsule Take 40 mg by mouth daily before breakfast.    . finasteride (PROSCAR) 5 MG tablet Take 5 mg by mouth daily.    . metoprolol tartrate (LOPRESSOR) 25 MG tablet TAKE ONE-HALF TABLET BY MOUTH TWICE DAILY  . Multiple Vitamin (MULITIVITAMIN WITH MINERALS) TABS Take 1 tablet by mouth daily.  . naproxen sodium (ANAPROX) 220 MG tablet Take 220 mg by mouth as needed. For pain  . nitroGLYCERIN (NITROSTAT) 0.4 MG SL tablet Place 0.4 mg under the tongue every 5 (five) minutes as needed. For chest pains  . NON FORMULARY Place 1 drop into both eyes as needed. Restora - OTC  . pravastatin (PRAVACHOL) 20 MG tablet TAKE ONE TABLET BY MOUTH EVERY DAY  . Probiotic Product (PROBIOTIC DAILY PO) Take 1 tablet by mouth daily.  . sildenafil (VIAGRA) 50 MG tablet Take 50 mg by mouth daily as needed for erectile dysfunction.  . tamsulosin (FLOMAX) 0.4 MG CAPS capsule Take 1 capsule by mouth daily.   Past medical history;  Hypertension.  Hyperlipidemia.  Sleep apnea.  He suffered bile duct injury at the time of laparoscopic cholecystectomy in 1993 and was transferred from [facility] to [facility] and injury repaired.   He underwent percutaneous drainage for liver abscess at [facility] in October 2012. He was evaluated by me in August 2013 for recurrent fever abdominal pain and elevated transaminases a liver biopsy revealed cholangitis felt to be secondary to biliary tract  disease. He was referred to [facility] date he had percutaneous dilation of hepaticojejunostomy with stenting but it did not work. He eventually had resection of hepatic lobe a September 2014. Mediastinal lymph node biopsy In March 2001 no malignancy identified.  Lymph node biopsy from neck in March 2003 and once again was negative.  Benign prostatic hypertrophy. Prostate biopsy negative for carcinoma. He is under  care of Dr. Rosana Hoes.  Coronary artery disease. One vessel was stented in February 2010.  History of colonic polyps removed on 3 prior colonoscopies. Most recent exam was in March 2013 with removal of 2 tubular adenomas.  History of sclerotic bone lesions. He has been evaluated by oncologist and bone scan November 2012 and motor is Truman Hayward in July 2013 and these lesions are stable.  Malignant melanoma in situ removed from right gluteal region in April 2006.  He has small AAA less than 3 cm in diameter.   Objective: Blood pressure 110/68, pulse 74, temperature 97.1 F (36.2 C), temperature source Oral, resp. rate 18, height 5\' 11"  (1.803 m), weight 182 lb 14.4 oz (82.963 kg). Patient is alert and in no acute distress. Conjunctiva is pink. Sclera is nonicteric Oropharyngeal mucosa is normal. No neck masses or thyromegaly noted. Cardiac exam with regular rhythm normal S1 and S2. No murmur or gallop noted. Lungs are clear to auscultation. Abdomen is symmetrical. Bowel sounds are normal. On palpation abdomen is soft and nontender without organomegaly or masses.  No LE edema or clubbing noted.  Labs/studies Results: Blood cultures from 12/16/2013 were negative CBC from 11/20/2013. WBC 4.9, H&H 10.7 and 34.3 and platelet count 133K. Bilirubin 1.18, AP 3:30, AST 40, ALT 46, total protein 5.8 and albumin 3.0 Calcium 8.7  Abdominal pelvic CT from 11/09/2013 revealed postsurgical changes of the right lobe resection multifocal second right bony lesion suspicious for metastatic disease, splenomegaly and enlarged prostate diverticulosis without diverticulitis. Assessment:  #1. History of febrile illness. He had intermittent fever from January 2015 to June 2015. He was hospitalized in June 2015 with what appears to be sepsis although cultures were negative. He had subsequent evaluation at [facility] and biliary  or anastomotic stricture ruled out. Echo was negative for vegetations and urologic evaluation is  unremarkable. Suspect febrile illness secondary to cholangitis given his prior history and lack of other etiology. He has gone 3 months without fever which is reassuring. He will need to be monitored closely. #2. Solid food dysphagia. He may have Schatzki's ring or esophageal stricture. Heartburn is well controlled with therapy. #3. History of anemia most likely secondary to acute illness.   Plan:  Patient will go to the lab for CBC, LFTs and sedimentation rate. Patient advised to go to ER if he has temp greater than  102 F or if he has abdominal pain nausea and vomiting and low-grade fever. Barium pill esophagogram. He may or may not need EGD depending on results of this study and clinical course. Office visit in 6 months or earlier if needed.

## 2014-02-09 NOTE — Patient Instructions (Addendum)
If you have fever greater than 102 F please go to emergency room immediately. Physician will call with results of blood tests and barium study

## 2014-02-15 ENCOUNTER — Ambulatory Visit (HOSPITAL_COMMUNITY)
Admission: RE | Admit: 2014-02-15 | Discharge: 2014-02-15 | Disposition: A | Discharge status: Home / Self Care | Payer: Medicare Other | Source: Ambulatory Visit | Attending: Internal Medicine | Admitting: Internal Medicine

## 2014-02-15 DIAGNOSIS — R1310 Dysphagia, unspecified: Secondary | ICD-10-CM | POA: Diagnosis not present

## 2014-02-25 DIAGNOSIS — Z23 Encounter for immunization: Secondary | ICD-10-CM | POA: Diagnosis not present

## 2014-03-23 ENCOUNTER — Emergency Department (HOSPITAL_COMMUNITY): Payer: Medicare Other

## 2014-03-23 ENCOUNTER — Inpatient Hospital Stay (HOSPITAL_COMMUNITY)
Admission: EM | Admit: 2014-03-23 | Discharge: 2014-03-26 | DRG: 872 | Disposition: A | Discharge status: Home / Self Care | Payer: Medicare Other | Attending: Family Medicine | Admitting: Family Medicine

## 2014-03-23 ENCOUNTER — Encounter (HOSPITAL_COMMUNITY): Payer: Self-pay | Admitting: Emergency Medicine

## 2014-03-23 DIAGNOSIS — K830 Cholangitis: Secondary | ICD-10-CM | POA: Diagnosis present

## 2014-03-23 DIAGNOSIS — G4730 Sleep apnea, unspecified: Secondary | ICD-10-CM | POA: Diagnosis present

## 2014-03-23 DIAGNOSIS — Z800 Family history of malignant neoplasm of digestive organs: Secondary | ICD-10-CM | POA: Diagnosis not present

## 2014-03-23 DIAGNOSIS — Z7982 Long term (current) use of aspirin: Secondary | ICD-10-CM | POA: Diagnosis not present

## 2014-03-23 DIAGNOSIS — K750 Abscess of liver: Secondary | ICD-10-CM | POA: Diagnosis present

## 2014-03-23 DIAGNOSIS — D696 Thrombocytopenia, unspecified: Secondary | ICD-10-CM | POA: Diagnosis present

## 2014-03-23 DIAGNOSIS — J449 Chronic obstructive pulmonary disease, unspecified: Secondary | ICD-10-CM | POA: Diagnosis present

## 2014-03-23 DIAGNOSIS — R748 Abnormal levels of other serum enzymes: Secondary | ICD-10-CM | POA: Diagnosis not present

## 2014-03-23 DIAGNOSIS — N400 Enlarged prostate without lower urinary tract symptoms: Secondary | ICD-10-CM | POA: Diagnosis present

## 2014-03-23 DIAGNOSIS — M1990 Unspecified osteoarthritis, unspecified site: Secondary | ICD-10-CM | POA: Diagnosis present

## 2014-03-23 DIAGNOSIS — K8309 Other cholangitis: Secondary | ICD-10-CM

## 2014-03-23 DIAGNOSIS — A419 Sepsis, unspecified organism: Principal | ICD-10-CM | POA: Diagnosis present

## 2014-03-23 DIAGNOSIS — Z85820 Personal history of malignant melanoma of skin: Secondary | ICD-10-CM

## 2014-03-23 DIAGNOSIS — R1110 Vomiting, unspecified: Secondary | ICD-10-CM | POA: Diagnosis not present

## 2014-03-23 DIAGNOSIS — R100 Acute abdomen: Secondary | ICD-10-CM | POA: Diagnosis not present

## 2014-03-23 DIAGNOSIS — Z9861 Coronary angioplasty status: Secondary | ICD-10-CM | POA: Diagnosis not present

## 2014-03-23 DIAGNOSIS — Z87891 Personal history of nicotine dependence: Secondary | ICD-10-CM

## 2014-03-23 DIAGNOSIS — Z91041 Radiographic dye allergy status: Secondary | ICD-10-CM

## 2014-03-23 DIAGNOSIS — Z9889 Other specified postprocedural states: Secondary | ICD-10-CM | POA: Diagnosis not present

## 2014-03-23 DIAGNOSIS — D638 Anemia in other chronic diseases classified elsewhere: Secondary | ICD-10-CM | POA: Diagnosis present

## 2014-03-23 DIAGNOSIS — A4151 Sepsis due to Escherichia coli [E. coli]: Secondary | ICD-10-CM

## 2014-03-23 DIAGNOSIS — R7881 Bacteremia: Secondary | ICD-10-CM | POA: Diagnosis not present

## 2014-03-23 DIAGNOSIS — Z8546 Personal history of malignant neoplasm of prostate: Secondary | ICD-10-CM

## 2014-03-23 DIAGNOSIS — I2510 Atherosclerotic heart disease of native coronary artery without angina pectoris: Secondary | ICD-10-CM | POA: Diagnosis present

## 2014-03-23 DIAGNOSIS — R509 Fever, unspecified: Secondary | ICD-10-CM | POA: Diagnosis present

## 2014-03-23 DIAGNOSIS — I10 Essential (primary) hypertension: Secondary | ICD-10-CM

## 2014-03-23 DIAGNOSIS — I480 Paroxysmal atrial fibrillation: Secondary | ICD-10-CM | POA: Diagnosis present

## 2014-03-23 DIAGNOSIS — D649 Anemia, unspecified: Secondary | ICD-10-CM

## 2014-03-23 DIAGNOSIS — I714 Abdominal aortic aneurysm, without rupture: Secondary | ICD-10-CM | POA: Diagnosis not present

## 2014-03-23 DIAGNOSIS — E785 Hyperlipidemia, unspecified: Secondary | ICD-10-CM | POA: Diagnosis present

## 2014-03-23 LAB — URINALYSIS, ROUTINE W REFLEX MICROSCOPIC
Bilirubin Urine: NEGATIVE
Glucose, UA: NEGATIVE mg/dL
Hgb urine dipstick: NEGATIVE
Ketones, ur: NEGATIVE mg/dL
Leukocytes, UA: NEGATIVE
Nitrite: NEGATIVE
Protein, ur: NEGATIVE mg/dL
Specific Gravity, Urine: 1.02 (ref 1.005–1.030)
Urobilinogen, UA: 1 mg/dL (ref 0.0–1.0)
pH: 7 (ref 5.0–8.0)

## 2014-03-23 LAB — COMPREHENSIVE METABOLIC PANEL
ALT: 218 U/L — ABNORMAL HIGH (ref 0–53)
AST: 551 U/L — ABNORMAL HIGH (ref 0–37)
Albumin: 3.5 g/dL (ref 3.5–5.2)
Alkaline Phosphatase: 447 U/L — ABNORMAL HIGH (ref 39–117)
Anion gap: 14 (ref 5–15)
BUN: 14 mg/dL (ref 6–23)
CO2: 24 mEq/L (ref 19–32)
Calcium: 8.7 mg/dL (ref 8.4–10.5)
Chloride: 101 mEq/L (ref 96–112)
Creatinine, Ser: 0.86 mg/dL (ref 0.50–1.35)
GFR calc Af Amer: >90 mL/min (ref >90)
GFR calc non Af Amer: 83 mL/min — ABNORMAL LOW (ref >90)
Glucose, Bld: 140 mg/dL — ABNORMAL HIGH (ref 70–99)
Potassium: 4 mEq/L (ref 3.7–5.3)
Sodium: 139 mEq/L (ref 137–147)
Total Bilirubin: 2.6 mg/dL — ABNORMAL HIGH (ref 0.3–1.2)
Total Protein: 6.6 g/dL (ref 6.0–8.3)

## 2014-03-23 LAB — CBC WITH DIFFERENTIAL/PLATELET
Basophils Absolute: 0 10*3/uL (ref 0.0–0.1)
Basophils Relative: 0 % (ref 0–1)
Eosinophils Absolute: 0 10*3/uL (ref 0.0–0.7)
Eosinophils Relative: 0 % (ref 0–5)
HCT: 36.1 % — ABNORMAL LOW (ref 39.0–52.0)
Hemoglobin: 11.7 g/dL — ABNORMAL LOW (ref 13.0–17.0)
Lymphocytes Relative: 3 % — ABNORMAL LOW (ref 12–46)
Lymphs Abs: 0.2 10*3/uL — ABNORMAL LOW (ref 0.7–4.0)
MCH: 27 pg (ref 26.0–34.0)
MCHC: 32.4 g/dL (ref 30.0–36.0)
MCV: 83.2 fL (ref 78.0–100.0)
Monocytes Absolute: 0.2 10*3/uL (ref 0.1–1.0)
Monocytes Relative: 2 % — ABNORMAL LOW (ref 3–12)
Neutro Abs: 8.1 10*3/uL — ABNORMAL HIGH (ref 1.7–7.7)
Neutrophils Relative %: 95 % — ABNORMAL HIGH (ref 43–77)
Platelets: 84 10*3/uL — ABNORMAL LOW (ref 150–400)
RBC: 4.34 MIL/uL (ref 4.22–5.81)
RDW: 15.9 % — ABNORMAL HIGH (ref 11.5–15.5)
WBC: 8.5 10*3/uL (ref 4.0–10.5)

## 2014-03-23 MED ORDER — FINASTERIDE 5 MG PO TABS
5.0000 mg | ORAL_TABLET | Freq: Every day | ORAL | Status: DC
  Administered 2014-03-24 – 2014-03-26 (×3): 5 mg via ORAL
  Filled 2014-03-23 (×6): qty 1

## 2014-03-23 MED ORDER — SODIUM CHLORIDE 0.9 % IV SOLN
INTRAVENOUS | Status: AC
  Administered 2014-03-23: 21:00:00 via INTRAVENOUS

## 2014-03-23 MED ORDER — PIPERACILLIN-TAZOBACTAM 3.375 G IVPB 30 MIN
3.3750 g | Freq: Once | INTRAVENOUS | Status: AC
  Administered 2014-03-23: 3.375 g via INTRAVENOUS
  Filled 2014-03-23: qty 50

## 2014-03-23 MED ORDER — ACETAMINOPHEN 325 MG PO TABS: 650.0000 mg | ORAL_TABLET | Freq: Four times a day (QID) | ORAL | Status: DC | PRN

## 2014-03-23 MED ORDER — VANCOMYCIN HCL IN DEXTROSE 1-5 GM/200ML-% IV SOLN
1000.0000 mg | Freq: Two times a day (BID) | INTRAVENOUS | Status: DC
  Administered 2014-03-24 – 2014-03-25 (×3): 1000 mg via INTRAVENOUS
  Filled 2014-03-23 (×3): qty 200

## 2014-03-23 MED ORDER — TAMSULOSIN HCL 0.4 MG PO CAPS
0.4000 mg | ORAL_CAPSULE | Freq: Every day | ORAL | Status: DC
  Administered 2014-03-23 – 2014-03-26 (×4): 0.4 mg via ORAL
  Filled 2014-03-23 (×4): qty 1

## 2014-03-23 MED ORDER — ACETAMINOPHEN 650 MG RE SUPP: 650.0000 mg | Freq: Four times a day (QID) | RECTAL | Status: DC | PRN

## 2014-03-23 MED ORDER — ASPIRIN 81 MG PO CHEW
81.0000 mg | CHEWABLE_TABLET | Freq: Every day | ORAL | Status: DC
  Administered 2014-03-23 – 2014-03-26 (×4): 81 mg via ORAL
  Filled 2014-03-23 (×4): qty 1

## 2014-03-23 MED ORDER — VANCOMYCIN HCL IN DEXTROSE 1-5 GM/200ML-% IV SOLN
1000.0000 mg | Freq: Once | INTRAVENOUS | Status: AC
  Administered 2014-03-23: 1000 mg via INTRAVENOUS
  Filled 2014-03-23: qty 200

## 2014-03-23 MED ORDER — ENOXAPARIN SODIUM 40 MG/0.4ML ~~LOC~~ SOLN: 40.0000 mg | SUBCUTANEOUS | Status: DC

## 2014-03-23 MED ORDER — MORPHINE SULFATE 2 MG/ML IJ SOLN: 2.0000 mg | INTRAMUSCULAR | Status: DC | PRN

## 2014-03-23 MED ORDER — SODIUM CHLORIDE 0.9 % IV SOLN
INTRAVENOUS | Status: DC
  Administered 2014-03-24: 04:00:00 via INTRAVENOUS

## 2014-03-23 MED ORDER — METOPROLOL TARTRATE 25 MG PO TABS
37.5000 mg | ORAL_TABLET | Freq: Two times a day (BID) | ORAL | Status: DC
  Administered 2014-03-23 – 2014-03-26 (×6): 37.5 mg via ORAL
  Filled 2014-03-23 (×6): qty 2

## 2014-03-23 MED ORDER — ONDANSETRON HCL 4 MG/2ML IJ SOLN: 4.0000 mg | Freq: Four times a day (QID) | INTRAMUSCULAR | Status: DC | PRN

## 2014-03-23 MED ORDER — ONDANSETRON HCL 4 MG PO TABS: 4.0000 mg | ORAL_TABLET | Freq: Four times a day (QID) | ORAL | Status: DC | PRN

## 2014-03-23 MED ORDER — PIPERACILLIN-TAZOBACTAM 3.375 G IVPB
3.3750 g | Freq: Three times a day (TID) | INTRAVENOUS | Status: DC
  Administered 2014-03-24 – 2014-03-26 (×7): 3.375 g via INTRAVENOUS
  Filled 2014-03-23 (×17): qty 50

## 2014-03-23 NOTE — ED Notes (Addendum)
Since midday,  Chills, fever, vomiting, and abdominal pain.  Temp 102 at home.  Had 1/2 his liver removed 1 year ago and has had 2 infections with sepsis since surgery.

## 2014-03-23 NOTE — Progress Notes (Signed)
ANTIBIOTIC CONSULT NOTE - INITIAL  Pharmacy Consult for vancomycin & Zosyn  Indication: sepsis  Allergies  Allergen Reactions  . Contrast Media [Iodinated Diagnostic Agents]     Hives, needs pre meds  . Other Itching and Other (See Comments)    Contrast dye erythema    Patient Measurements: Height: 5\' 11"  (180.3 cm) Weight: 184 lb 8 oz (83.689 kg) IBW/kg (Calculated) : 75.3 Adjusted Body Weight: 78kg  Vital Signs: Temp: 98.4 F (36.9 C) (10/27 2219) Temp Source: Oral (10/27 2125) BP: 117/56 mmHg (10/27 2125) Pulse Rate: 81 (10/27 2125) Intake/Output from previous day:   Intake/Output from this shift:    Labs:  Recent Labs  03/23/14 1850  WBC 8.5  HGB 11.7*  PLT 84*  CREATININE 0.86   Estimated Creatinine Clearance: 79 ml/min (by C-G formula based on Cr of 0.86). No results found for this basename: VANCOTROUGH, Corlis Leak, VANCORANDOM, [place], GENTPEAK, GENTRANDOM, [place], TOBRAPEAK, TOBRARND, AMIKACINPEAK, AMIKACINTROU, AMIKACIN,  in the last 72 hours   Microbiology: Recent Results (from the past 720 hour(s))  CULTURE, BLOOD (ROUTINE X 2)     Status: None   Collection Time    03/23/14  7:28 PM      Result Value Ref Range Status   Specimen Description BLOOD RIGHT HAND   Final   Special Requests BOTTLES DRAWN AEROBIC AND ANAEROBIC [place]   Final   Culture PENDING   Incomplete   Report Status PENDING   Incomplete    Medical History: Past Medical History  Diagnosis Date  . Prostate ca   . BPH (benign prostatic hyperplasia)   . Gastritis   . Osteoarthritis   . Melanoma     sigmoid diverticulosis  . Goiter   . Colon polyps   . CAD (coronary artery disease), native coronary artery February 2010    mid LAD lesion: PCI with 3.5 mm x 35 mm Cypher DES postdilated to 4.0 mm;   . Hyperlipidemia with target LDL less than 70     For CAD  . PAF (paroxysmal atrial fibrillation)   . COPD (chronic obstructive pulmonary disease)   . Syncope and collapse  January 2010    PVC's and14 beats NSVT as well as atrial afib  . Sleep apnea 04/02/2005    nocturnal polysomogram ordered K.Clance  . 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 Presence of drug coated stent in LAD coronary artery     Medications:  Scheduled:  . sodium chloride   Intravenous STAT  . aspirin  81 mg Oral Daily  . enoxaparin (LOVENOX) injection  40 mg Subcutaneous Q24H  . [START ON 03/24/2014] finasteride  5 mg Oral Daily  . metoprolol tartrate  37.5 mg Oral BID  . [START ON 03/24/2014] piperacillin-tazobactam (ZOSYN)  IV  3.375 g Intravenous 3 times per day  . tamsulosin  0.4 mg Oral Daily  . [START ON 03/24/2014] vancomycin  1,000 mg Intravenous Q12H   Infusions:  . sodium chloride     PRN: acetaminophen, acetaminophen, morphine injection, ondansetron (ZOFRAN) IV, ondansetron  Assessment: 66yr male with hx of prostate CA, chronic live abscess (with indwellin drain), colon polyps, CAD, COPD.  Patient with dx of R/O sepsis to be treated with vancomycin and Zosyn.  Patient aleady had one dose Zosyn 3.375gm and Vancomycin 1g at approx 2000 tonight.  Goal of Therapy:  Desire vancomycin trough level 15-75mcg/ml & standard Zosyn regimen with CrCl >80ml/min  Plan:  1.  Vancomycin 1gm IV q12h 2.  Zosyn 3.375gm IV q8h 3.  Monitor for indices of infection & renal function 4.  Check actual vancomycin steady state serum trough level as clinically indicated  Krimson Massmann, Milta Deiters E 03/23/2014,10:52 PM

## 2014-03-23 NOTE — ED Provider Notes (Signed)
CSN: [id number]     Arrival date & time 03/23/14  1802 History   First MD Initiated Contact with Patient 03/23/14 [place]     Chief Complaint  Patient presents with  . Abdominal Pain  . Fever     (Consider location/radiation/quality/duration/timing/severity/associated sxs/prior Treatment) Patient is a 74 y.o. male presenting with abdominal pain and fever. The history is provided by the patient.  Abdominal Pain Associated symptoms: fatigue and fever   Associated symptoms: no chest pain, no diarrhea, no nausea, no shortness of breath and no vomiting   Fever Associated symptoms: no chest pain, no diarrhea, no headaches, no nausea, no rash and no vomiting    patient presents with fever right there is some slight confusion. Has had a history of same in the past. Had a remote liver abscess and later had cholangitis. Has had some episodes of fever since and it required admission. There was question of recurrent cholangitis. Per family patient's gastroenterologist, Dr [name] on admission to the hospital and would want certain tests. No nausea. No cough. No diarrhea. Slight upper abdominal pain. No headache. No dysuria. Fever reportedly up to 102 at home.  Past Medical History  Diagnosis Date  . Prostate ca   . BPH (benign prostatic hyperplasia)   . Gastritis   . Osteoarthritis   . Melanoma     sigmoid diverticulosis  . Goiter   . Colon polyps   . CAD (coronary artery disease), native coronary artery February 2010    mid LAD lesion: PCI with 3.5 mm x 35 mm Cypher DES postdilated to 4.0 mm;   . Hyperlipidemia with target LDL less than 70     For CAD  . PAF (paroxysmal atrial fibrillation)   . COPD (chronic obstructive pulmonary disease)   . Syncope and collapse January 2010    PVC's and14 beats NSVT as well as atrial afib  . Sleep apnea 04/02/2005    nocturnal polysomogram ordered K.Clance  . 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 Presence of drug coated stent in LAD coronary artery    Past Surgical History  Procedure Laterality Date  . Cholecystectomy      complicated by rupture of common bile duct and other vascular structures. Has now had multiple issues with chronic hepatic abscess. Currently has indwelling drain  . Surgical resection, melanoma    . Colonoscopy  08/16/2011    Procedure: COLONOSCOPY;  Surgeon: Rogene Houston, MD;  [facility];  Service: Endoscopy;  Laterality: N/A;  1200  . Cardiac catheterization  Feb 2010    post DES to LAD ,3.5 X 33 mm Cypher stent,dilated up to 3.8 mm 80 to90% stenosis btwn first and second diag branc w.moderate disease EF 50%  . Doppler echocardiography  JAN 2010    EF 50 to 55% w/boderline concentic LVH,mildly dilated atrium ,frequent PVC  9METS  . Nm myocar perf wall motion  06/09/2008    EF 01% LV systolic fx mildly reduced.Ventricular fx preserved.  Modena Nunnery doppler  07/28/2008    NORMAL RGT GROIN DUPLEX DOPPLER  . Cardionet monitor  06/08/2008-06/24/2008  . Liver surgery  09-03/14    @ [facility]   Family History  Problem Relation Age of Onset  . Cancer Sister     colon  . Colon cancer Neg Hx    History  Substance Use Topics  . Smoking status: Former Smoker -- 1.50 packs/day for 50 years  Types: Cigarettes    Quit date: 05/29/2007  . Smokeless tobacco: Never Used  . Alcohol Use: 0.5 oz/week    1 drink(s) per week    Review of Systems  Constitutional: Positive for fever and fatigue. Negative for activity change and appetite change.  Eyes: Negative for pain.  Respiratory: Negative for chest tightness and shortness of breath.   Cardiovascular: Negative for chest pain and leg swelling.  Gastrointestinal: Positive for abdominal pain. Negative for nausea, vomiting and diarrhea.  Genitourinary: Negative for flank pain.  Musculoskeletal: Negative for back pain and neck stiffness.  Skin: Negative for color change and rash.  Neurological: Negative  for weakness, numbness and headaches.  Psychiatric/Behavioral: Negative for behavioral problems.      Allergies  Contrast media and Other  Home Medications   Prior to Admission medications   Medication Sig Start Date End Date Taking? Authorizing Provider  aspirin 81 MG tablet Take 81 mg by mouth daily.    Historical Provider, MD  esomeprazole (NEXIUM) 40 MG capsule Take 40 mg by mouth daily before breakfast.      Historical Provider, MD  finasteride (PROSCAR) 5 MG tablet Take 5 mg by mouth daily.      Historical Provider, MD  metoprolol tartrate (LOPRESSOR) 25 MG tablet TAKE ONE-HALF TABLET BY MOUTH TWICE DAILY 12/09/13   Leonie Man, MD  Multiple Vitamin (MULITIVITAMIN WITH MINERALS) TABS Take 1 tablet by mouth daily.    Historical Provider, MD  naproxen sodium (ANAPROX) 220 MG tablet Take 220 mg by mouth as needed. For pain    Historical Provider, MD  nitroGLYCERIN (NITROSTAT) 0.4 MG SL tablet Place 0.4 mg under the tongue every 5 (five) minutes as needed. For chest pains    Historical Provider, MD  NON FORMULARY Place 1 drop into both eyes as needed. Restora - OTC    Historical Provider, MD  pravastatin (PRAVACHOL) 20 MG tablet TAKE ONE TABLET BY MOUTH EVERY DAY 12/09/13   Leonie Man, MD  Probiotic Product (PROBIOTIC DAILY PO) Take 1 tablet by mouth daily.    Historical Provider, MD  sildenafil (VIAGRA) 50 MG tablet Take 50 mg by mouth daily as needed for erectile dysfunction.    Historical Provider, MD  tamsulosin (FLOMAX) 0.4 MG CAPS capsule Take 1 capsule by mouth daily. 11/26/13   Historical Provider, MD   BP 141/68  Pulse 88  Temp(Src) 98.8 F (37.1 C) (Oral)  Resp 18  Ht 5\' 11"  (1.803 m)  Wt 180 lb (81.647 kg)  BMI 25.12 kg/m2  SpO2 96% Physical Exam  Nursing note and vitals reviewed. Constitutional: He is oriented to person, place, and time. He appears well-developed and well-nourished.  Patient appears somewhat uncomfortable  HENT:  Head: Normocephalic and  atraumatic.  Eyes: EOM are normal. Pupils are equal, round, and reactive to light.  Neck: Normal range of motion. Neck supple.  Cardiovascular: Normal rate, regular rhythm and normal heart sounds.   No murmur heard. Pulmonary/Chest: Effort normal and breath sounds normal.  Abdominal: Soft. Bowel sounds are normal. He exhibits no distension and no mass. There is tenderness. There is no rebound and no guarding.  Mild upper abdominal tenderness without rebound or guarding.  Musculoskeletal: Normal range of motion. He exhibits no edema.  Neurological: He is alert and oriented to person, place, and time. No cranial nerve deficit.  Skin: Skin is warm and dry.  Psychiatric: He has a normal mood and affect.    ED Course  Procedures (including critical  care time) Labs Review Labs Reviewed  CBC WITH DIFFERENTIAL - Abnormal; Notable for the following:    Hemoglobin 11.7 (*)    HCT 36.1 (*)    RDW 15.9 (*)    Platelets 84 (*)    Neutrophils Relative % 95 (*)    Neutro Abs 8.1 (*)    Lymphocytes Relative 3 (*)    Lymphs Abs 0.2 (*)    Monocytes Relative 2 (*)    All other components within normal limits  COMPREHENSIVE METABOLIC PANEL - Abnormal; Notable for the following:    Glucose, Bld 140 (*)    AST 551 (*)    ALT 218 (*)    Alkaline Phosphatase 447 (*)    Total Bilirubin 2.6 (*)    GFR calc non Af Amer 83 (*)    All other components within normal limits  CULTURE, BLOOD (ROUTINE X 2)  CULTURE, BLOOD (ROUTINE X 2)  URINALYSIS, ROUTINE W REFLEX MICROSCOPIC    Imaging Review Dg Abd Acute W/chest  03/23/2014   CLINICAL DATA:  Fever.  Chills.  Vomiting.  Abdominal pain.  EXAM: ACUTE ABDOMEN SERIES (ABDOMEN 2 VIEW & CHEST 1 VIEW)  COMPARISON:  None.  FINDINGS: There is no evidence of dilated bowel loops or free intraperitoneal air. Multiple surgical clips seen in right upper quadrant. No radiopaque calculi identified.  Heart size and mediastinal contours are within normal limits. Both  lungs are clear.  IMPRESSION: No acute findings.   Electronically Signed   By: Earle Gell M.D.   On: 03/23/2014 19:29     EKG Interpretation None      MDM   Final diagnoses:  Fever  Cholangitis, recurrent    Patient with fever, discussed with Dr. Melony Overly. Recommends IV antibiotics and admission to hospital. Will need CT, however with patient's dye allergy and will be done tomorrow. Has a history of cholangitis. Admit to internal medicine. LFTs are elevated.    Jasper Riling. Alvino Chapel, MD 03/23/14 2053

## 2014-03-23 NOTE — H&P (Signed)
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 Patient has a remote history of bile duct injury leading to hepaticojejunostomy. Patient developed cholangitis in 2013, unresponsive to percutaneous dilation of the stricture and eventually had resection of right hepatic lobe at [facility] one year ago. Patient was fine is generally of this year when he started to have episodes of recurrent fever every 6-8 weeks. Patient was subsequently seen at [facility] again and underwent CT and MRCP in June which was unremarkable. No clear source of infection was found but the fever was felt to be related to the biliary tract. Patient is followed by GI Dr. Corbin Ade Today patient came to the hospital after he developed fever 102 at home along with chills and vomiting. Patient has right upper quadrant pain. Today his labs revealed elevated AST 551 ALT 218 and bili 2.6, he has normal white count. GI was consulted by the ED physician who recommended medical admission with IV antibiotics. No CT scan could be obtained as patient has contrast media allergy. He denies chest pain, no shortness of breath no cough, no dysuria urgency or frequency of urination. Allergies:   Allergies  Allergen Reactions  . Contrast Media [Iodinated Diagnostic Agents]     Hives, needs pre meds  . Other Itching and Other (See Comments)    Contrast dye erythema      Past Medical History  Diagnosis Date  . Prostate ca     . BPH (benign prostatic hyperplasia)   . Gastritis   . Osteoarthritis   . Melanoma     sigmoid diverticulosis  . Goiter   . Colon polyps   . CAD (coronary artery disease), native coronary artery February 2010    mid LAD lesion: PCI with 3.5 mm x 35 mm Cypher DES postdilated to 4.0 mm;   . Hyperlipidemia with target LDL less than 70     For CAD  . PAF (paroxysmal atrial fibrillation)   . COPD (chronic obstructive pulmonary disease)   . Syncope and collapse January 2010    PVC's and14 beats NSVT as well as atrial afib  . Sleep apnea 04/02/2005    nocturnal polysomogram ordered K.Clance  . 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 Presence of drug coated stent in LAD coronary artery     Past Surgical History  Procedure Laterality Date  . Cholecystectomy      complicated by rupture of common bile duct and other vascular structures. Has now had multiple issues with chronic hepatic abscess. Currently has indwelling drain  . Surgical resection, melanoma    . Colonoscopy  08/16/2011    Procedure: COLONOSCOPY;  Surgeon: Rogene Houston, MD;  Location: [facility];  Service: Endoscopy;  Laterality: N/A;  1200  . Cardiac catheterization  Feb 2010    post DES to LAD ,3.5 X 33 mm Cypher stent,dilated up to 3.8 mm 80 to90% stenosis btwn first and second diag branc w.moderate disease EF 50%  . Doppler echocardiography  JAN 2010  EF 50 to 55% w/boderline concentic LVH,mildly dilated atrium ,frequent PVC  9METS  . Nm myocar perf wall motion  06/09/2008    EF 21% LV systolic fx mildly reduced.Ventricular fx preserved.  Modena Nunnery doppler  07/28/2008    NORMAL RGT GROIN DUPLEX DOPPLER  . Cardionet monitor  06/08/2008-06/24/2008  . Liver surgery  09-03/14    @ [facility]    Prior to Admission medications   Medication Sig Start Date End Date Taking? Authorizing Provider  aspirin 81 MG tablet Take 81 mg by mouth daily.    Historical  Provider, MD  esomeprazole (NEXIUM) 40 MG capsule Take 40 mg by mouth daily before breakfast.      Historical Provider, MD  finasteride (PROSCAR) 5 MG tablet Take 5 mg by mouth daily.      Historical Provider, MD  metoprolol tartrate (LOPRESSOR) 25 MG tablet TAKE ONE-HALF TABLET BY MOUTH TWICE DAILY 12/09/13   Leonie Man, MD  Multiple Vitamin (MULITIVITAMIN WITH MINERALS) TABS Take 1 tablet by mouth daily.    Historical Provider, MD  naproxen sodium (ANAPROX) 220 MG tablet Take 220 mg by mouth as needed. For pain    Historical Provider, MD  nitroGLYCERIN (NITROSTAT) 0.4 MG SL tablet Place 0.4 mg under the tongue every 5 (five) minutes as needed. For chest pains    Historical Provider, MD  NON FORMULARY Place 1 drop into both eyes as needed. Restora - OTC    Historical Provider, MD  pravastatin (PRAVACHOL) 20 MG tablet TAKE ONE TABLET BY MOUTH EVERY DAY 12/09/13   Leonie Man, MD  Probiotic Product (PROBIOTIC DAILY PO) Take 1 tablet by mouth daily.    Historical Provider, MD  sildenafil (VIAGRA) 50 MG tablet Take 50 mg by mouth daily as needed for erectile dysfunction.    Historical Provider, MD  tamsulosin (FLOMAX) 0.4 MG CAPS capsule Take 1 capsule by mouth daily. 11/26/13   Historical Provider, MD    Social History:  reports that he quit smoking about 6 years ago. His smoking use included Cigarettes. He has a 75 pack-year smoking history. He has never used smokeless tobacco. He reports that he drinks about .5 ounces of alcohol per week. He reports that he does not use illicit drugs.  Family History  Problem Relation Age of Onset  . Cancer Sister     colon  . Colon cancer Neg Hx      All the positives are listed in BOLD  Review of Systems:  HEENT: Headache, blurred vision, runny nose, sore throat Neck: Hypothyroidism, hyperthyroidism,,lymphadenopathy Chest : Shortness of breath, history of COPD, Asthma Heart : Chest pain, history of coronary arterey disease GI:  Nausea,  vomiting, diarrhea, constipation, GERD GU: Dysuria, urgency, frequency of urination, hematuria Neuro: Stroke, seizures, syncope Psych: Depression, anxiety, hallucinations   Physical Exam: Blood pressure 141/68, pulse 88, temperature 98.8 F (37.1 C), temperature source Oral, resp. rate 18, height 5\' 11"  (1.803 m), weight 81.647 kg (180 lb), SpO2 96.00%. Constitutional:   Patient is a well-developed and well-nourished male* in no acute distress and cooperative with exam. Head: Normocephalic and atraumatic Mouth: Mucus membranes moist Eyes: PERRL, EOMI, conjunctivae normal Neck: Supple, No Thyromegaly Cardiovascular: RRR, S1 normal, S2 normal Pulmonary/Chest: CTAB, no wheezes, rales, or rhonchi Abdominal: Soft. Positive right upper quadrant tenderness to palpation, non-distended, bowel sounds are normal, no masses, organomegaly, or guarding present.  Neurological: A&O x3, Strenght is normal and symmetric bilaterally, cranial nerve II-XII are grossly intact, no focal motor deficit,  sensory intact to light touch bilaterally.  Extremities : No Cyanosis, Clubbing or Edema  Labs on Admission:  Basic Metabolic Panel:  Recent Labs Lab 03/23/14 1850  NA 139  K 4.0  CL 101  CO2 24  GLUCOSE 140*  BUN 14  CREATININE 0.86  CALCIUM 8.7   Liver Function Tests:  Recent Labs Lab 03/23/14 1850  AST 551*  ALT 218*  ALKPHOS 447*  BILITOT 2.6*  PROT 6.6  ALBUMIN 3.5   No results found for this basename: LIPASE, AMYLASE,  in the last 168 hours No results found for this basename: AMMONIA,  in the last 168 hours CBC:  Recent Labs Lab 03/23/14 1850  WBC 8.5  NEUTROABS 8.1*  HGB 11.7*  HCT 36.1*  MCV 83.2  PLT 84*    Radiological Exams on Admission: Dg Abd Acute W/chest  03/23/2014   CLINICAL DATA:  Fever.  Chills.  Vomiting.  Abdominal pain.  EXAM: ACUTE ABDOMEN SERIES (ABDOMEN 2 VIEW & CHEST 1 VIEW)  COMPARISON:  None.  FINDINGS: There is no evidence of dilated bowel loops or  free intraperitoneal air. Multiple surgical clips seen in right upper quadrant. No radiopaque calculi identified.  Heart size and mediastinal contours are within normal limits. Both lungs are clear.  IMPRESSION: No acute findings.   Electronically Signed   By: Earle Gell M.D.   On: 03/23/2014 19:29   .    Assessment/Plan Principal Problem:   Cholangitis, recurrent Active Problems:   Essential hypertension   Liver abscess   CAD S/P percutaneous coronary angioplasty   Fever  Fever,? Cholangitis Patient has had recurrent episodes of fever with elevated liver enzymes with no clear-cut cause of infection. At this time will umbilically start the patient on vancomycin and Zosyn, will obtain abdominal ultrasound in a.m. GI consultation in a.m.  Essential hypertension Continue metoprolol, blood pressure is well controlled at this time.  CAD status post PTCA Continue aspirin, metoprolol. We'll hold the statin at this time due to the elevated liver enzymes.  DVT prophylaxis Lovenox  Code status: Patient is full code  Family discussion: Admission, patients condition and plan of care including tests being ordered have been discussed with the patient and his wife and daughter at bedside* who indicate understanding and agree with the plan and Code Status.   Time Spent on Admission: 60 minutes  [facility] Pager: [phone] 03/23/2014, 8:30 PM  If 7PM-7AM, please contact night-coverage  [email or web address]  Password [email or web address]

## 2014-03-24 ENCOUNTER — Inpatient Hospital Stay (HOSPITAL_COMMUNITY): Payer: Medicare Other

## 2014-03-24 DIAGNOSIS — R509 Fever, unspecified: Secondary | ICD-10-CM

## 2014-03-24 DIAGNOSIS — R748 Abnormal levels of other serum enzymes: Secondary | ICD-10-CM

## 2014-03-24 DIAGNOSIS — K830 Cholangitis: Secondary | ICD-10-CM

## 2014-03-24 DIAGNOSIS — R7881 Bacteremia: Secondary | ICD-10-CM

## 2014-03-24 DIAGNOSIS — D638 Anemia in other chronic diseases classified elsewhere: Secondary | ICD-10-CM

## 2014-03-24 DIAGNOSIS — D696 Thrombocytopenia, unspecified: Secondary | ICD-10-CM

## 2014-03-24 DIAGNOSIS — D649 Anemia, unspecified: Secondary | ICD-10-CM

## 2014-03-24 LAB — COMPREHENSIVE METABOLIC PANEL
ALT: 245 U/L — ABNORMAL HIGH (ref 0–53)
AST: 415 U/L — ABNORMAL HIGH (ref 0–37)
Albumin: 2.8 g/dL — ABNORMAL LOW (ref 3.5–5.2)
Alkaline Phosphatase: 375 U/L — ABNORMAL HIGH (ref 39–117)
Anion gap: 12 (ref 5–15)
BUN: 16 mg/dL (ref 6–23)
CALCIUM: 8.2 mg/dL — AB (ref 8.4–10.5)
CO2: 24 meq/L (ref 19–32)
CREATININE: 0.98 mg/dL (ref 0.50–1.35)
Chloride: 104 mEq/L (ref 96–112)
GFR, EST NON AFRICAN AMERICAN: 78 mL/min — AB (ref >90)
GLUCOSE: 110 mg/dL — AB (ref 70–99)
Potassium: 3.7 mEq/L (ref 3.7–5.3)
Sodium: 140 mEq/L (ref 137–147)
Total Bilirubin: 4 mg/dL — ABNORMAL HIGH (ref 0.3–1.2)
Total Protein: 5.6 g/dL — ABNORMAL LOW (ref 6.0–8.3)

## 2014-03-24 LAB — CBC
HCT: 32.8 % — ABNORMAL LOW (ref 39.0–52.0)
HEMOGLOBIN: 10.6 g/dL — AB (ref 13.0–17.0)
MCH: 26.8 pg (ref 26.0–34.0)
MCHC: 32.3 g/dL (ref 30.0–36.0)
MCV: 82.8 fL (ref 78.0–100.0)
Platelets: 97 10*3/uL — ABNORMAL LOW (ref 150–400)
RBC: 3.96 MIL/uL — AB (ref 4.22–5.81)
RDW: 16 % — ABNORMAL HIGH (ref 11.5–15.5)
WBC: 13.2 10*3/uL — ABNORMAL HIGH (ref 4.0–10.5)

## 2014-03-24 LAB — PROTIME-INR
INR: 1.36 (ref 0.00–1.49)
PROTHROMBIN TIME: 16.9 s — AB (ref 11.6–15.2)

## 2014-03-24 MED ORDER — CYCLOSPORINE 0.05 % OP EMUL
1.0000 [drp] | Freq: Every day | OPHTHALMIC | Status: DC | PRN
  Filled 2014-03-24: qty 1

## 2014-03-24 MED ORDER — PIPERACILLIN-TAZOBACTAM 3.375 G IVPB
INTRAVENOUS | Status: AC
  Filled 2014-03-24: qty 50

## 2014-03-24 NOTE — Progress Notes (Signed)
PROGRESS NOTE  DAIRE OKIMOTO WHQ:[id number] DOB: May 18, 1941 DOA: 03/23/2014 PCP: [name], MD GI Dr. [name]  Summary: 73 year old man with complex hepatobiliary history including remote bile duct injury, hepaticojejunostomy, failed percutaneous dilatations of strictures, hepatic abscess 2012, subsequent resection of right hepatic lobe at [facility] September 2014, recurrent febrile illness who presented to the emergency department with history of fever at home as well as right upper quadrant pain. Admitted for acute cholangitis.  Assessment/Plan: 1. Gram-negative rod bacteremia likely second cholangitis. Evaluation at [facility] in the past reportedly ruled out biliary or anastomotic stricture, vegetations and urologic process. Urinalysis negative this admission. 2. Acute cholangitis. Bacteremic but hemodynamically stable. 3. Recurrent intermittent fever January-June 2015 4. History of bile duct injury leading to hepaticojejunostomy, history of failed percutaneous dilation of the stricture, resection of right hepatic lobe at [facility] 01/2013. 5. Thrombocytopenia, chronic. Presumably secondary to chronic liver disease 6. Anemia of chronic disease. 7. History of coronary artery disease, paroxysmal atrial fibrillation, COPD 8. History of prostate cancer, melanoma   Appears clinically stable at this point and remains afebrile. Plan continue liquids per GI, continue empiric antibiotics, GI plans to discuss with liver team at [facility] for further recommendations.  Code Status: full code DVT prophylaxis: Lovenox Family Communication: none present Disposition Plan: pending  [name], MD  [facility]  Pager [phone] If 7PM-7AM, please contact night-coverage at [email or web address], password [facility] 03/24/2014, 4:10 PM  LOS: 1 day   Consultants:  GI  Procedures:    Antibiotics:  Zosyn 10/27 >>  Vancomycin 10/27 >>  HPI/Subjective: No complaints, no abdominal pain, no nausea,  no vomiting. Hungry.   Objective: Filed Vitals:   03/23/14 2125 03/23/14 2219 03/24/14 0540 03/24/14 1423  BP: 117/56  108/61 109/59  Pulse: 81  59 56  Temp: 100.2 F (37.9 C) 98.4 F (36.9 C) 99.7 F (37.6 C) 98.2 F (36.8 C)  TempSrc: Oral  Oral Oral  Resp: 18  18 18   Height: 5\' 11"  (1.803 m)     Weight: 83.689 kg (184 lb 8 oz)     SpO2: 95%  96% 98%    Intake/Output Summary (Last 24 hours) at 03/24/14 1610 Last data filed at 03/24/14 1200  Gross per 24 hour  Intake  972.5 ml  Output    550 ml  Net  422.5 ml     Filed Weights   03/23/14 1827 03/23/14 2125  Weight: 81.647 kg (180 lb) 83.689 kg (184 lb 8 oz)    Exam:     Afebrile, vital signs are stable. No hypoxia. No fever recorded since admission.  General: Appears calm, comfortable. Well-appearing.  Psych: Alert. Speech fluent and clear.  CV: Regular rate and rhythm. No murmur, rub or gallop.  Respiratory: Clear to auscultation bilaterally. No wheezes, rales or rhonchi. Normal respiratory effort.  Abdomen: Soft, nontender, nondistended. Well-healed abdominal scars noted.  Data Reviewed:  Basic metabolic panel unremarkable.  Alkaline phosphatase, AST somewhat improved, ALT mildly increased, bilirubin has increased from 2.6 >> 4.0.  Hemoglobin 10.6, WBC now 13.2, platelet count stable 97.  Both blood cultures are positive for gram-negative rods  Scheduled Meds: . aspirin  81 mg Oral Daily  . enoxaparin (LOVENOX) injection  40 mg Subcutaneous Q24H  . finasteride  5 mg Oral Daily  . metoprolol tartrate  37.5 mg Oral BID  . piperacillin-tazobactam (ZOSYN)  IV  3.375 g Intravenous 3 times per day  . tamsulosin  0.4 mg Oral Daily  . vancomycin  1,000  mg Intravenous Q12H   Continuous Infusions: . sodium chloride 75 mL/hr at 03/24/14 0346    Principal Problem:   Cholangitis, recurrent Active Problems:   Essential hypertension   Liver abscess   CAD S/P percutaneous coronary angioplasty   Fever    Thrombocytopenia   Anemia of chronic disease   Time spent 20 minutes

## 2014-03-24 NOTE — Progress Notes (Signed)
Dr. [name] called around 2300, ordering an ECRP to be completed in the AM, as early as possible, for cholangitis. Call placed to [facility] and message left on machine for surgery/endoscopy.

## 2014-03-24 NOTE — Care Management Note (Signed)
    Page 1 of 1   03/26/2014     3:18:30 PM CARE MANAGEMENT NOTE 03/26/2014  Patient:  Alexander Burton, Alexander Burton   Account Number:  [id number]  Date Initiated:  03/24/2014  Documentation initiated by:  Theophilus Kinds  Subjective/Objective Assessment:   Pt admitted from home with cholangitis. Pt lives with his wife and will return home at discharge. Pt is independent with ADL's.     Action/Plan:   Billey Gosling continue to follow for discharge planning needs.   Anticipated DC Date:  03/27/2014   Anticipated DC Plan:  [place]  CM consult      Choice offered to / List presented to:             Status of service:  Completed, signed off Medicare Important Message given?  YES (If response is "NO", the following Medicare IM given date fields will be blank) Date Medicare IM given:  03/26/2014 Medicare IM given by:  Theophilus Kinds Date Additional Medicare IM given:   Additional Medicare IM given by:    Discharge Disposition:  HOME/SELF CARE  Per UR Regulation:    If discussed at Long Length of Stay Meetings, dates discussed:    Comments:  03/26/14 [place], RN BSN CM Pt discharged home today. No CM needs noted.  03/24/14 [place], RN BSN CM

## 2014-03-24 NOTE — Progress Notes (Signed)
CRITICAL VALUE ALERT  Critical value received:  Positive blood cultures x2 with gram negative rods  Date of notification:  03/24/14  Time of notification:  2330  Critical value read back:Yes.    Nurse who received alert:  Di Kindle   MD notified (1st page):  [place], G.  Time of first page:  1430  MD notified (2nd page):  Time of second page:  Responding MD:  [name]  Time MD responded:  1446

## 2014-03-24 NOTE — Consult Note (Signed)
Reason for Consult: Referring Physician: Hospitalist  Alexander Burton is an 73 y.o. male.  HPI: Admitted thru the ED yesterday. Patient began to have a fever 102. He also had nausea and vomiting. He denies having any abdominal pain. He began to have chills around 430pm yesterday. Nausea and vomiting. He says he feels fine now. He appears flushed. Patinet has a remote history of bile duct injury leading to hepaticojejunostomy. Hx of recurrent cholangitis that had been unresponsive to percutaneous dilatation of stricture. Underwent a resection of rt hepatic lobe in September of 2014 at [facility]. He developed a fever 104.5 in June while at [facility]. Apparently he was treated for sepsis but his cultures were negative. He later underwent a CT and MRCP at [facility] by Dr. Richrd Sox which were unremarkable.  His appetite has remained good. BMs move every day. No melena or BRRB.  He says he has gained most of his weight back.    Hyperlipidemia.  Sleep apnea.  He suffered bile duct injury at the time of laparoscopic cholecystectomy in 1993 and was transferred from [facility] to [facility] and injury repaired.  He underwent percutaneous drainage for liver abscess at [facility] in October 2012.  He was evaluated by Dr. [name] in August 2013 for recurrent fever abdominal pain and elevated transaminases a liver biopsy revealed cholangitis felt to be secondary to biliary tract disease. He was referred to [facility] date he had percutaneous dilation of hepaticojejunostomy with stenting but it did not work. He eventually had resection of hepatic lobe a September 2014.  Mediastinal lymph node biopsy In March 2001 no malignancy identified.  Lymph node biopsy from neck in March 2003 and once again was negative.  Benign prostatic hypertrophy. Prostate biopsy negative for carcinoma. He is under care of Dr. Rosana Hoes.  Coronary artery disease. One vessel was stented in February 2010.  History of colonic polyps removed on 3 prior  colonoscopies. Most recent exam was in March 2013 with removal of 2 tubular adenomas.  History of sclerotic bone lesions. He has been evaluated by oncologist and bone scan November 2012 and motor is Truman Hayward in July 2013 and these lesions are stable.  Malignant melanoma in situ removed from right gluteal region in April 2006.  He has small AAA less than 3 cm in diameter.    Past Medical History  Diagnosis Date  . Prostate ca   . BPH (benign prostatic hyperplasia)   . Gastritis   . Osteoarthritis   . Melanoma     sigmoid diverticulosis  . Goiter   . Colon polyps   . CAD (coronary artery disease), native coronary artery February 2010    mid LAD lesion: PCI with 3.5 mm x 35 mm Cypher DES postdilated to 4.0 mm;   . Hyperlipidemia with target LDL less than 70     For CAD  . PAF (paroxysmal atrial fibrillation)   . COPD (chronic obstructive pulmonary disease)   . Syncope and collapse January 2010    PVC's and14 beats NSVT as well as atrial afib  . Sleep apnea 04/02/2005    nocturnal polysomogram ordered K.Clance  . 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 Presence of drug coated stent in LAD coronary artery     Past Surgical History  Procedure Laterality Date  . Cholecystectomy      complicated by rupture of common bile duct and other vascular structures. Has now had multiple issues with chronic  hepatic abscess. Currently has indwelling drain  . Surgical resection, melanoma    . Colonoscopy  08/16/2011    Procedure: COLONOSCOPY;  Surgeon: Rogene Houston, MD;  Location: [facility];  Service: Endoscopy;  Laterality: N/A;  1200  . Cardiac catheterization  Feb 2010    post DES to LAD ,3.5 X 33 mm Cypher stent,dilated up to 3.8 mm 80 to90% stenosis btwn first and second diag branc w.moderate disease EF 50%  . Doppler echocardiography  JAN 2010    EF 50 to 55% w/boderline concentic LVH,mildly dilated atrium ,frequent PVC  9METS   . Nm myocar perf wall motion  06/09/2008    EF 62% LV systolic fx mildly reduced.Ventricular fx preserved.  Modena Nunnery doppler  07/28/2008    NORMAL RGT GROIN DUPLEX DOPPLER  . Cardionet monitor  06/08/2008-06/24/2008  . Liver surgery  09-03/14    @ [facility]    Family History  Problem Relation Age of Onset  . Cancer Sister     colon  . Colon cancer Neg Hx     Social History:  reports that he quit smoking about 6 years ago. His smoking use included Cigarettes. He has a 75 pack-year smoking history. He has never used smokeless tobacco. He reports that he drinks about .5 ounces of alcohol per week. He reports that he does not use illicit drugs.  Allergies:  Allergies  Allergen Reactions  . Contrast Media [Iodinated Diagnostic Agents]     Hives, needs pre meds  . Other Itching and Other (See Comments)    Contrast dye erythema    Medications: I have reviewed the patient's current medications.  Results for orders placed during the hospital encounter of 03/23/14 (from the past 48 hour(s))  CBC WITH DIFFERENTIAL     Status: Abnormal   Collection Time    03/23/14  6:50 PM      Result Value Ref Range   WBC 8.5  4.0 - 10.5 K/uL   RBC 4.34  4.22 - 5.81 MIL/uL   Hemoglobin 11.7 (*) 13.0 - 17.0 g/dL   HCT 36.1 (*) 39.0 - 52.0 %   MCV 83.2  78.0 - 100.0 fL   MCH 27.0  26.0 - 34.0 pg   MCHC 32.4  30.0 - 36.0 g/dL   RDW 15.9 (*) 11.5 - 15.5 %   Platelets 84 (*) 150 - 400 K/uL   Comment: PLATELET COUNT CONFIRMED BY SMEAR     PLATELETS APPEAR DECREASED     LARGE PLATELETS PRESENT   Neutrophils Relative % 95 (*) 43 - 77 %   Neutro Abs 8.1 (*) 1.7 - 7.7 K/uL   Lymphocytes Relative 3 (*) 12 - 46 %   Lymphs Abs 0.2 (*) 0.7 - 4.0 K/uL   Monocytes Relative 2 (*) 3 - 12 %   Monocytes Absolute 0.2  0.1 - 1.0 K/uL   Eosinophils Relative 0  0 - 5 %   Eosinophils Absolute 0.0  0.0 - 0.7 K/uL   Basophils Relative 0  0 - 1 %   Basophils Absolute 0.0  0.0 - 0.1 K/uL  COMPREHENSIVE METABOLIC PANEL      Status: Abnormal   Collection Time    03/23/14  6:50 PM      Result Value Ref Range   Sodium 139  137 - 147 mEq/L   Potassium 4.0  3.7 - 5.3 mEq/L   Chloride 101  96 - 112 mEq/L   CO2 24  19 - 32 mEq/L  Glucose, Bld 140 (*) 70 - 99 mg/dL   BUN 14  6 - 23 mg/dL   Creatinine, Ser 0.86  0.50 - 1.35 mg/dL   Calcium 8.7  8.4 - 10.5 mg/dL   Total Protein 6.6  6.0 - 8.3 g/dL   Albumin 3.5  3.5 - 5.2 g/dL   AST 551 (*) 0 - 37 U/L   ALT 218 (*) 0 - 53 U/L   Alkaline Phosphatase 447 (*) 39 - 117 U/L   Total Bilirubin 2.6 (*) 0.3 - 1.2 mg/dL   GFR calc non Af Amer 83 (*) >90 mL/min   GFR calc Af Amer >90  >90 mL/min   Comment: (NOTE)     The eGFR has been calculated using the CKD EPI equation.     This calculation has not been validated in all clinical situations.     eGFR's persistently <90 mL/min signify possible Chronic Kidney     Disease.   Anion gap 14  5 - 15  URINALYSIS, ROUTINE W REFLEX MICROSCOPIC     Status: None   Collection Time    03/23/14  7:01 PM      Result Value Ref Range   Color, Urine YELLOW  YELLOW   APPearance CLEAR  CLEAR   Specific Gravity, Urine 1.020  1.005 - 1.030   pH 7.0  5.0 - 8.0   Glucose, UA NEGATIVE  NEGATIVE mg/dL   Hgb urine dipstick NEGATIVE  NEGATIVE   Bilirubin Urine NEGATIVE  NEGATIVE   Ketones, ur NEGATIVE  NEGATIVE mg/dL   Protein, ur NEGATIVE  NEGATIVE mg/dL   Urobilinogen, UA 1.0  0.0 - 1.0 mg/dL   Nitrite NEGATIVE  NEGATIVE   Leukocytes, UA NEGATIVE  NEGATIVE   Comment: MICROSCOPIC NOT DONE ON URINES WITH NEGATIVE PROTEIN, BLOOD, LEUKOCYTES, NITRITE, OR GLUCOSE <1000 mg/dL.  CULTURE, BLOOD (ROUTINE X 2)     Status: None   Collection Time    03/23/14  7:28 PM      Result Value Ref Range   Specimen Description BLOOD RIGHT HAND     Special Requests BOTTLES DRAWN AEROBIC AND ANAEROBIC [facility] EACH     Culture PENDING     Report Status PENDING    CBC     Status: Abnormal   Collection Time    03/24/14  5:27 AM      Result Value Ref Range    WBC 13.2 (*) 4.0 - 10.5 K/uL   RBC 3.96 (*) 4.22 - 5.81 MIL/uL   Hemoglobin 10.6 (*) 13.0 - 17.0 g/dL   HCT 32.8 (*) 39.0 - 52.0 %   MCV 82.8  78.0 - 100.0 fL   MCH 26.8  26.0 - 34.0 pg   MCHC 32.3  30.0 - 36.0 g/dL   RDW 16.0 (*) 11.5 - 15.5 %   Platelets 97 (*) 150 - 400 K/uL   Comment: SPECIMEN CHECKED FOR CLOTS     CONSISTENT WITH PREVIOUS RESULT  COMPREHENSIVE METABOLIC PANEL     Status: Abnormal   Collection Time    03/24/14  5:27 AM      Result Value Ref Range   Sodium 140  137 - 147 mEq/L   Potassium 3.7  3.7 - 5.3 mEq/L   Chloride 104  96 - 112 mEq/L   CO2 24  19 - 32 mEq/L   Glucose, Bld 110 (*) 70 - 99 mg/dL   BUN 16  6 - 23 mg/dL   Creatinine, Ser 0.98  0.50 -  1.35 mg/dL   Calcium 8.2 (*) 8.4 - 10.5 mg/dL   Total Protein 5.6 (*) 6.0 - 8.3 g/dL   Albumin 2.8 (*) 3.5 - 5.2 g/dL   AST 415 (*) 0 - 37 U/L   ALT 245 (*) 0 - 53 U/L   Alkaline Phosphatase 375 (*) 39 - 117 U/L   Total Bilirubin 4.0 (*) 0.3 - 1.2 mg/dL   GFR calc non Af Amer 78 (*) >90 mL/min   GFR calc Af Amer >90  >90 mL/min   Comment: (NOTE)     The eGFR has been calculated using the CKD EPI equation.     This calculation has not been validated in all clinical situations.     eGFR's persistently <90 mL/min signify possible Chronic Kidney     Disease.   Anion gap 12  5 - 15  PROTIME-INR     Status: Abnormal   Collection Time    03/24/14  5:27 AM      Result Value Ref Range   Prothrombin Time 16.9 (*) 11.6 - 15.2 seconds   INR 1.36  0.00 - 1.49    Dg Abd Acute W/chest  03/23/2014   CLINICAL DATA:  Fever.  Chills.  Vomiting.  Abdominal pain.  EXAM: ACUTE ABDOMEN SERIES (ABDOMEN 2 VIEW & CHEST 1 VIEW)  COMPARISON:  None.  FINDINGS: There is no evidence of dilated bowel loops or free intraperitoneal air. Multiple surgical clips seen in right upper quadrant. No radiopaque calculi identified.  Heart size and mediastinal contours are within normal limits. Both lungs are clear.  IMPRESSION: No acute  findings.   Electronically Signed   By: Earle Gell M.D.   On: 03/23/2014 19:29    ROS Blood pressure 108/61, pulse 59, temperature 99.7 F (37.6 C), temperature source Oral, resp. rate 18, height '5\' 11"'  (1.803 m), weight 184 lb 8 oz (83.689 kg), SpO2 96.00%. Physical Exam Alert and oriented. Skin warm and dry. Oral mucosa is moist.   . Sclera anicteric, conjunctivae is pink. Thyroid not enlarged. No cervical lymphadenopathy. Lungs clear. Heart regular rate and rhythm.  Abdomen is soft. Bowel sounds are positive. No hepatomegaly. No abdominal masses felt. No tenderness.  No edema to lower extremities.    Assessment/Plan: #1. History of febrile illness. He had intermittent fever from January 2015 to June  2015. He was hospitalized in June 2015 with what appears to be sepsis although cultures were negative. Suspect febrile illness secondary to cholangitis given his prior history.  Agree with zosyn and Vancomycin.  Will monitor closely.  [place]  Pending.  BC in progress.    SETZER,TERRI W 03/24/2014, 8:18 AM   GI attending note; Patient interviewed and examined. Patient is well known to me from previous evaluations and most recently on 02/09/2014. He presents with recurrent cholangitis. He has gram-negative rods in blood. Abdominal exam reveals no tenderness whatsoever. Ultrasound does not reveal dilated intrahepatic ducts. Suspect patient has anastomotic stricture at left hepaticojejunostomy and he needs to be reevaluated and stricture dilated if feasible. I contacted Dr. Colonel Bald of [facility], patient's hepatobiliary surgeon. He is tied up in the OR and will call later. Unless patient is transferred with plan MRCP in a.m. Thrombocytopenia is possibly secondary to sepsis and he could also have secondary biliary cirrhosis. Diet advanced. Lab studies will be repeated in a.m.

## 2014-03-24 NOTE — Progress Notes (Signed)
UR chart review completed.  

## 2014-03-25 ENCOUNTER — Inpatient Hospital Stay (HOSPITAL_COMMUNITY): Payer: Medicare Other

## 2014-03-25 DIAGNOSIS — D696 Thrombocytopenia, unspecified: Secondary | ICD-10-CM

## 2014-03-25 LAB — COMPREHENSIVE METABOLIC PANEL
ALT: 159 U/L — ABNORMAL HIGH (ref 0–53)
AST: 151 U/L — ABNORMAL HIGH (ref 0–37)
Albumin: 2.7 g/dL — ABNORMAL LOW (ref 3.5–5.2)
Alkaline Phosphatase: 338 U/L — ABNORMAL HIGH (ref 39–117)
Anion gap: 11 (ref 5–15)
BUN: 16 mg/dL (ref 6–23)
CALCIUM: 8.2 mg/dL — AB (ref 8.4–10.5)
CO2: 25 mEq/L (ref 19–32)
Chloride: 106 mEq/L (ref 96–112)
Creatinine, Ser: 1.14 mg/dL (ref 0.50–1.35)
GFR calc non Af Amer: 61 mL/min — ABNORMAL LOW (ref >90)
GFR, EST AFRICAN AMERICAN: 71 mL/min — AB (ref >90)
GLUCOSE: 90 mg/dL (ref 70–99)
Potassium: 3.4 mEq/L — ABNORMAL LOW (ref 3.7–5.3)
SODIUM: 142 meq/L (ref 137–147)
Total Bilirubin: 3.8 mg/dL — ABNORMAL HIGH (ref 0.3–1.2)
Total Protein: 5.6 g/dL — ABNORMAL LOW (ref 6.0–8.3)

## 2014-03-25 LAB — CBC
HEMATOCRIT: 32.5 % — AB (ref 39.0–52.0)
Hemoglobin: 10.5 g/dL — ABNORMAL LOW (ref 13.0–17.0)
MCH: 26.9 pg (ref 26.0–34.0)
MCHC: 32.3 g/dL (ref 30.0–36.0)
MCV: 83.3 fL (ref 78.0–100.0)
Platelets: 81 10*3/uL — ABNORMAL LOW (ref 150–400)
RBC: 3.9 MIL/uL — AB (ref 4.22–5.81)
RDW: 16.5 % — ABNORMAL HIGH (ref 11.5–15.5)
WBC: 6.6 10*3/uL (ref 4.0–10.5)

## 2014-03-25 MED ORDER — GADOBENATE DIMEGLUMINE 529 MG/ML IV SOLN
15.0000 mL | Freq: Once | INTRAVENOUS | Status: AC | PRN
  Administered 2014-03-25: 15 mL via INTRAVENOUS

## 2014-03-25 NOTE — Progress Notes (Addendum)
  PROGRESS NOTE  Alexander Burton XTK:[id number] DOB: 1941/02/09 DOA: 03/23/2014 PCP: [name], MD GI Dr. [name]  Summary: 73 year old man with complex hepatobiliary history including remote bile duct injury, hepaticojejunostomy, failed percutaneous dilatations of strictures, hepatic abscess 2012, subsequent resection of right hepatic lobe at [facility] September 2014, recurrent febrile illness who presented to the emergency department with history of fever at home as well as right upper quadrant pain. Admitted for acute cholangitis.  Assessment/Plan: 1. Gram-negative rod bacteremia with possible early sepsis likely second cholangitis. Asymptomatic. Urinalysis negative this admission. 2. Acute cholangitis. Bacteremic but remains  hemodynamically stable. 3. History of bile duct injury leading to hepaticojejunostomy, history of failed percutaneous dilation of the stricture, resection of right hepatic lobe at [facility] 01/2013. 4. Thrombocytopenia, chronic. Presumably secondary to chronic liver disease 5. Anemia of chronic disease. Stable.  6. History of coronary artery disease, paroxysmal atrial fibrillation, COPD 7. History of prostate cancer, melanoma   Clinically stable. Plan continue Zosyn pending culture results. Discontinue vancomycin. MRCP today with further recs per GI to follow.  SCDs  CMP in AM  Discussed with wife at bedside  Code Status: full code DVT prophylaxis: SCDs (Plts <100k) Family Communication: none present Disposition Plan: pending  [name], MD  [facility]  Pager [phone] If 7PM-7AM, please contact night-coverage at [email or web address], password [facility] 03/25/2014, 9:35 AM  LOS: 2 days   Consultants:  GI  Procedures:    Antibiotics:  Zosyn 10/27 >>  Vancomycin 10/27 >>  HPI/Subjective: No issues overnight. Tolerated liquids last night.  No pain, mild nausea; no vomiting. Hungry.   Objective: Filed Vitals:   03/24/14 1423 03/24/14  2101 03/24/14 2155 03/25/14 0552  BP: 109/59 114/64 146/72 127/65  Pulse: 56 66 60 62  Temp: 98.2 F (36.8 C)  99.4 F (37.4 C) 99.1 F (37.3 C)  TempSrc: Oral   Oral  Resp: 18  20 20   Height:      Weight:      SpO2: 98%  98% 96%    Intake/Output Summary (Last 24 hours) at 03/25/14 0935 Last data filed at 03/24/14 1758  Gross per 24 hour  Intake    480 ml  Output    300 ml  Net    180 ml     Filed Weights   03/23/14 1827 03/23/14 2125  Weight: 81.647 kg (180 lb) 83.689 kg (184 lb 8 oz)    Exam:     Afebrile, VSS, no hypoxia  Appears calm and comfortable.  Alert, speech fluent and clear  CV RRR no m/r/g. No LE edema.  Resp. CTA bilaterally no w/r/r. Normal respiratory effort.  Abdomen soft ntnd  Data Reviewed:  Potassium 3.4  LFTs improved  Hgb and plts stable.  BC still pending GNR 2/2  Scheduled Meds: . aspirin  81 mg Oral Daily  . finasteride  5 mg Oral Daily  . metoprolol tartrate  37.5 mg Oral BID  . piperacillin-tazobactam (ZOSYN)  IV  3.375 g Intravenous 3 times per day  . tamsulosin  0.4 mg Oral Daily  . vancomycin  1,000 mg Intravenous Q12H   Continuous Infusions:    Principal Problem:   Cholangitis, recurrent Active Problems:   Essential hypertension   Liver abscess   CAD S/P percutaneous coronary angioplasty   Fever   Thrombocytopenia   Anemia of chronic disease   Time spent 20 minutes

## 2014-03-25 NOTE — Progress Notes (Signed)
Patient ID: Alexander Burton, male   DOB: 1941/01/25, 73 y.o.   MRN: [id number] Denies any pain. Feels okay. Low grade fever. Liver enzymes gradually coming down. US abdomen revealed:IMPRESSION:  1. Postoperative changes in the liver. Liver appears dense and  lobulated.  2. Small abdominal aortic aneurysm.  CMP     Component Value Date/Time   NA 142 03/25/2014 0537   NA 144 11/20/2013 0911   K 3.4* 03/25/2014 0537   K 4.3 11/20/2013 0911   CL 106 03/25/2014 0537   CL 109* 11/19/2012 1044   CO2 25 03/25/2014 0537   CO2 28 11/20/2013 0911   GLUCOSE 90 03/25/2014 0537   GLUCOSE 78 11/20/2013 0911   GLUCOSE 78 11/19/2012 1044   BUN 16 03/25/2014 0537   BUN 16.0 11/20/2013 0911   CREATININE 1.14 03/25/2014 0537   CREATININE 0.8 11/20/2013 0911   CREATININE 0.94 12/14/2011 1546   CALCIUM 8.2* 03/25/2014 0537   CALCIUM 8.6 11/20/2013 0911   PROT 5.6* 03/25/2014 0537   PROT 5.8* 11/20/2013 0911   ALBUMIN 2.7* 03/25/2014 0537   ALBUMIN 3.0* 11/20/2013 0911   AST 151* 03/25/2014 0537   AST 40* 11/20/2013 0911   ALT 159* 03/25/2014 0537   ALT 46 11/20/2013 0911   ALKPHOS 338* 03/25/2014 0537   ALKPHOS 330* 11/20/2013 0911   BILITOT 3.8* 03/25/2014 0537   BILITOT 1.18 11/20/2013 0911   GFRNONAA 61* 03/25/2014 0537   GFRAA 71* 03/25/2014 0537       Filed Vitals:   03/24/14 1423 03/24/14 2101 03/24/14 2155 03/25/14 0552  BP: 109/59 114/64 146/72 127/65  Pulse: 56 66 60 62  Temp: 98.2 F (36.8 C)  99.4 F (37.4 C) 99.1 F (37.3 C)  TempSrc: Oral   Oral  Resp: 18  20 20   Height:      Weight:      SpO2: 98%  98% 96%   I/O last 3 completed shifts: In: 1452.5 [P.O.:480; I.V.:972.5] Out: 550 [Urine:550]   Alert. No abdominal pain. No edema to lower extremities. Assessment: Cholangitis. Dr. [name] has talked with Dr. Colonel Bald at [facility] (pt's hepatobiliary surgeon). MRCP today. Further recommendations to follow.

## 2014-03-25 NOTE — Progress Notes (Signed)
Patient has no complaints. He has good appetite. Blood cultures revealed Escherichia coli; other info pending. MRCP reviewed with Dr. Thornton Papas. Evidence of prior right hepatectomy. Left intrahepatic ducts do not appear to be dilated; hepaticojejunostomy not visualized.  Assessment; Recurrent cholangitis most likely secondary to intermittent obstruction at hepaticojejunostomy or anastomotic stricture. He could also have microlithiasis resulting in acute symptomatology. Patient,scondition and MRCP findings discussed with Dr. Colonel Bald, patient's hepatobiliary surgeon at [facility]. Since patient is improving with therapy he would like to delay intervention and would like to see him in the office in 2 weeks. Given patient's recurrent symptoms he needs to have anastomosis evaluated with percutaneous transhepatic cholangiogram. If he has microlithiasis ursodeoxycholic acid may help.  Anemia secondary to acute illness. Thrombocytopenia most likely secondary to underlying secondary to biliary cirrhosis. Recommendations; Discontinue vancomycin. Continue Zosyn until other information available on Escherichia coli sensitivity. Patient should be able to go home within a day or two. Will provide imaging studies to the patient that he can carry to [facility].

## 2014-03-25 NOTE — Discharge Instructions (Signed)
Magnetic Resonance Cholangiopancreatography  Magnetic resonance cholangiopancreatography (MRCP) is a procedure that lets a radiologist see and assess specific organs in your belly (abdomen). A radiologist is a doctor specializing in x-ray and imaging tests. During an MRCP, your doctor can see and access your:  Bile duct (tube that carries bile from the gallbladder to the intestine).  Pancreatic duct (tube that carries juices made by the pancreas to the intestine).  Gallbladder (pear-shaped body organ in which bile is stored). This procedure helps detect diseases, stones or possible cancers affecting these organs. A MRCP does not need a dye that is injected into the vein (contrast material). The scan does not require instruments, injections or drugs that help you sleep (anesthesia).  MRCP uses an imaging technique known as magnetic resonance imaging (MRI). This technique uses magnetic fields, radio waves and a computer to produce a detailed picture of the inside of the body. No radiation is used in this procedure.  No treatment is done during the procedure.  PREPARATION FOR TEST  Have nothing to eat or drink for about 4 hours before the procedure.  Metallic objects cannot be brought into the imaging room. Remove any jewelry, credit cards, hearing aids, pins, hairpins, metal zippers, watches, pens, pocketknives, removable dental implants, eyeglasses or other metallic items before entering the scanner area.  Inform your caregiver if you have any objects implanted in your body. This procedure can interfere with their functioning. The test cannot be done if you have the following:  Heart (cardiac) pacemaker.  Artificial heart valves (not all of them).  Inner ear (cochlear) implants.  Older blood vessel stents.  Metal clips used to repair a brain aneurysm (small out pouching that looks like a berry in the artery).  Permanent metal dental implants.  Artificial joints. Before the MRCP scan, you may need a  standard MRI of your abdomen.  You will need to lie on a table. You will then be wheeled inside the scanner. The scanner looks like a round tube.  A belt-like coil, which helps take clearer images, may be put around your waist.  If you feel that the table is hard or cold. You may request a blanket or pillow.  You may be given earplugs. The machine produces loud thumping and humming noises while working.  It is important to lie still during the scan.  You may be asked to hold your breath for a few seconds during the scan.  The technician that operates the machine will be in constant contact with you. They will be in a different room, but they can see you through a window.  Speakers inside the scanner let you to talk to staff.  If you have any problems during the procedure, there is a call bell to ring.  The scan does not cause pain.  If you feel distressed or closed in (claustrophobic), a soothing drug (sedative) may be offered.  There are no after-effects from the scan. NORMAL FINDINGS  A normal result suggests that no stones, blockages, growths or other problems were found with the structures or organs discussed at the beginning of this document. This does not guarantee that no problems exist.  MEANING OF TEST  Your caregiver may recommend this procedure if he/she suspects:  A blockage or stone in the bile duct.  Cancer of the bile duct or pancreas.  Irritation and swelling (inflammation) of the gallbladder.  Other problems with the organs or ducts described above. OBTAINING THE TEST RESULTS  Not all test  results are available during your visit. If your test results are not back during the visit, make an appointment with your caregiver to find out the results. Do not assume everything is normal if you have not heard from your caregiver or the medical facility. It is important for you to follow up on all of your test results.  Document Released: 10/31/2007 Document Revised: 08/06/2011 Document  Reviewed: 10/31/2007  [facility] Patient Information 2015 [place], [place]. This information is not intended to replace advice given to you by your health care provider. Make sure you discuss any questions you have with your health care provider.

## 2014-03-25 NOTE — Clinical Documentation Improvement (Signed)
  Patient with history chronic hepatic abscess admitted with Acute cholangitis, came to ED 2/2 temp 102 with chills, BCx positive GNR, SBP 146 down to 108 with P 83 down to 59, WBC 13.2, treated with IV Vancomycin and Zosyn. Please clarify if patient meets criteria for SIRS +/or early GNR Sepsis based on criteria.  Thank You,  Barrie Dunker RN [place] [phone] HIM department

## 2014-03-26 DIAGNOSIS — A419 Sepsis, unspecified organism: Secondary | ICD-10-CM

## 2014-03-26 DIAGNOSIS — A4151 Sepsis due to Escherichia coli [E. coli]: Secondary | ICD-10-CM

## 2014-03-26 LAB — CULTURE, BLOOD (ROUTINE X 2)

## 2014-03-26 LAB — COMPREHENSIVE METABOLIC PANEL
ALK PHOS: 308 U/L — AB (ref 39–117)
ALT: 111 U/L — ABNORMAL HIGH (ref 0–53)
AST: 76 U/L — ABNORMAL HIGH (ref 0–37)
Albumin: 2.5 g/dL — ABNORMAL LOW (ref 3.5–5.2)
Anion gap: 9 (ref 5–15)
BUN: 13 mg/dL (ref 6–23)
CHLORIDE: 105 meq/L (ref 96–112)
CO2: 26 meq/L (ref 19–32)
Calcium: 8.1 mg/dL — ABNORMAL LOW (ref 8.4–10.5)
Creatinine, Ser: 1.02 mg/dL (ref 0.50–1.35)
GFR, EST AFRICAN AMERICAN: 81 mL/min — AB (ref >90)
GFR, EST NON AFRICAN AMERICAN: 70 mL/min — AB (ref >90)
Glucose, Bld: 89 mg/dL (ref 70–99)
Potassium: 3.4 mEq/L — ABNORMAL LOW (ref 3.7–5.3)
SODIUM: 140 meq/L (ref 137–147)
Total Bilirubin: 2 mg/dL — ABNORMAL HIGH (ref 0.3–1.2)
Total Protein: 5.6 g/dL — ABNORMAL LOW (ref 6.0–8.3)

## 2014-03-26 MED ORDER — POTASSIUM CHLORIDE CRYS ER 20 MEQ PO TBCR
40.0000 meq | EXTENDED_RELEASE_TABLET | Freq: Once | ORAL | Status: AC
  Administered 2014-03-26: 40 meq via ORAL
  Filled 2014-03-26: qty 2

## 2014-03-26 MED ORDER — URSODIOL 300 MG PO CAPS: 300.0000 mg | ORAL_CAPSULE | Freq: Two times a day (BID) | ORAL | Status: DC

## 2014-03-26 MED ORDER — CIPROFLOXACIN HCL 250 MG PO TABS: 500.0000 mg | ORAL_TABLET | Freq: Two times a day (BID) | ORAL | Status: DC

## 2014-03-26 MED ORDER — CIPROFLOXACIN HCL 500 MG PO TABS: 500.0000 mg | ORAL_TABLET | Freq: Two times a day (BID) | ORAL | Status: DC

## 2014-03-26 MED ORDER — URSODIOL 300 MG PO CAPS
300.0000 mg | ORAL_CAPSULE | Freq: Two times a day (BID) | ORAL | Status: DC
  Administered 2014-03-26: 300 mg via ORAL
  Filled 2014-03-26 (×7): qty 1

## 2014-03-26 MED ORDER — CIPROFLOXACIN HCL 250 MG PO TABS
500.0000 mg | ORAL_TABLET | Freq: Two times a day (BID) | ORAL | Status: DC
  Administered 2014-03-26: 500 mg via ORAL
  Filled 2014-03-26: qty 2

## 2014-03-26 NOTE — Progress Notes (Signed)
IV's removed. Discharge instructions reviewed with patient and wife. Understanding verbalized. [facility] physician to call with appointment time per Dr. [name]. Dr. Durene Cal office closed until Monday. Wife to make that appointment.

## 2014-03-26 NOTE — Progress Notes (Signed)
  PROGRESS NOTE  Alexander Burton KPT:[id number] DOB: 1941/01/11 DOA: 03/23/2014 PCP: [name], MD GI Dr. [name]  Summary: 73 year old man with complex hepatobiliary history including remote bile duct injury, hepaticojejunostomy, failed percutaneous dilatations of strictures, hepatic abscess 2012, subsequent resection of right hepatic lobe at [facility] September 2014, recurrent febrile illness who presented to the emergency department with history of fever at home as well as right upper quadrant pain. Admitted for acute cholangitis.  Assessment/Plan: 1. E coli bacteremia with possible early sepsis second to presumed recurrent cholangitis. Sensitive to Cipro. 2. Acute cholangitis secondary to E coli. 3. History of bile duct injury leading to hepaticojejunostomy, history of failed percutaneous dilation of the stricture, resection of right hepatic lobe at [facility] 01/2013. 4. Thrombocytopenia, chronic. Presumably secondary to chronic liver disease 5. Anemia of chronic disease.  6. History of coronary artery disease, paroxysmal atrial fibrillation, COPD 7. History of prostate cancer, melanoma   Home today on Cipro (already has Rx for 500mg )  Dr. [name] has communicated with [facility] physician for follow-up.  Ursol 500 mg po BID per Dr. [name]  Discussed with wife at bedside  [name], MD  [facility]  Pager [phone] If 7PM-7AM, please contact night-coverage at [email or web address], password [facility] 03/26/2014, 7:20 PM  LOS: 3 days   Consultants:  GI  Procedures:    Antibiotics:  Zosyn 10/27 >> 10/30  Vancomycin 10/27 >> 10/29  Cipro 10/30 >> 10/6  HPI/Subjective: No complaints, ready to go home.  Objective: Filed Vitals:   03/25/14 2134 03/26/14 0500 03/26/14 0910 03/26/14 1338  BP: 156/72 136/56  145/75  Pulse: 60 58 64 54  Temp: 98.8 F (37.1 C) 98.8 F (37.1 C)  97.6 F (36.4 C)  TempSrc: Oral Oral  Oral  Resp: 20 20  20   Height:      Weight:        SpO2: 95% 95%  99%    Intake/Output Summary (Last 24 hours) at 03/26/14 1920 Last data filed at 03/26/14 1329  Gross per 24 hour  Intake    600 ml  Output      0 ml  Net    600 ml     Filed Weights   03/23/14 1827 03/23/14 2125  Weight: 81.647 kg (180 lb) 83.689 kg (184 lb 8 oz)    Exam:     Afebrile, VSS  Appears calm and comfortable  Alert, speech clear  CV RRR no m/r/g. No LE edema  Resp CTA bilaterally no w/r/r. Normal resp effort.  Data Reviewed:  MRCP unremarkable.   Culture noted  Scheduled Meds:  Continuous Infusions:   Principal Problem:   Cholangitis, recurrent Active Problems:   Essential hypertension   Liver abscess   CAD S/P percutaneous coronary angioplasty   Fever   Thrombocytopenia   Anemia of chronic disease

## 2014-03-26 NOTE — Discharge Summary (Signed)
Physician Discharge Summary  Alexander Burton WUJ:[id number] DOB: [birth year] DOA: 03/23/2014  PCP: [name], MD  Admit date: 03/23/2014 Discharge date: 03/26/2014  Recommendations for Outpatient Follow-up:  1. F/u recurrent cholangitis, resolution of E coli bacteremia 2. F/u AAA suggest in 3 years, see MR report below   Follow-up Information   Follow up with [name], MD. Schedule an appointment as soon as possible for a visit in 1 week.   Specialty:  Internal Medicine   Contact information:    [place] [address] [phone]      Discharge Diagnoses:  1. E coli bacteremia, possible early sepsis present on admission 2. Acute cholangitis 3. Thrombocytopenia, chronic 4. Anemia of chronic disease  Discharge Condition: improved Disposition: home  Diet recommendation: regular  Filed Weights   03/23/14 1827 03/23/14 2125  Weight: 81.647 kg (180 lb) 83.689 kg (184 lb 8 oz)    History of present illness:  73 year old man with complex hepatobiliary history including remote bile duct injury, hepaticojejunostomy, failed percutaneous dilatations of strictures, hepatic abscess 2012, subsequent resection of right hepatic lobe at [facility] September 2014, recurrent febrile illness who presented to the emergency department with history of fever at home as well as right upper quadrant pain. Admitted for acute cholangitis.  Hospital Course:  Treated with empiric abx and seen with GI. MRCP was unremarkable. Dr. [name] consulted with GI at [facility]. Culture E coli and transitioned to oral abx. Hospitalization was uncomplicated.  1. E coli bacteremia with possible early sepsis second to presumed recurrent cholangitis. Sensitive to Cipro. 2. Acute cholangitis secondary to E coli. 3. History of bile duct injury leading to hepaticojejunostomy, history of failed percutaneous dilation of the stricture, resection of right hepatic lobe at [facility]  01/2013. 4. Thrombocytopenia, chronic. Presumably secondary to chronic liver disease 5. Anemia of chronic disease.  6. History of coronary artery disease, paroxysmal atrial fibrillation, COPD 7. History of prostate cancer, melanoma Home today on Cipro (already has Rx for 500mg )  Dr. [name] has communicated with [facility] physician for follow-up.  Ursol 500 mg po BID per Dr. [name]  Discussed with wife at bedside  Consultants:  GI Procedures: none Antibiotics:  Zosyn 10/27 >> 10/30  Vancomycin 10/27 >> 10/29  Cipro 10/30 >> 10/6  Discharge Instructions  Discharge Instructions   Activity as tolerated - No restrictions    Complete by:  As directed      Diet general    Complete by:  As directed      Discharge instructions    Complete by:  As directed   Call your physician or seek immediate medical attention for fever, pain or worsening of condition.          Discharge Medication List as of 03/26/2014  2:36 PM    START taking these medications   Details  ciprofloxacin (CIPRO) 500 MG tablet Take 1 tablet (500 mg total) by mouth 2 (two) times daily., Starting 03/26/2014, Until Discontinued, No Print    ursodiol (ACTIGALL) 300 MG capsule Take 1 capsule (300 mg total) by mouth 2 (two) times daily., Starting 03/26/2014, Until Discontinued, Print      CONTINUE these medications which have NOT CHANGED   Details  aspirin EC 81 MG tablet Take 81 mg by mouth every evening., Until Discontinued, Historical Med    cycloSPORINE (RESTASIS) 0.05 % ophthalmic emulsion Place 1 drop into both eyes daily as needed (for dry eye relief)., Until Discontinued, Historical Med  esomeprazole (NEXIUM) 40 MG capsule Take 40 mg by mouth daily before breakfast.  , Until Discontinued, Historical Med    finasteride (PROSCAR) 5 MG tablet Take 5 mg by mouth every morning. , Until Discontinued, Historical Med    metoprolol tartrate (LOPRESSOR) 25 MG tablet Take 12.5 mg by mouth 2 (two) times daily. , Until  Discontinued, Historical Med    Multiple Vitamin (MULITIVITAMIN WITH MINERALS) TABS Take 1 tablet by mouth every morning. , Until Discontinued, Historical Med    naproxen sodium (ANAPROX) 220 MG tablet Take 220 mg by mouth as needed. For pain, Until Discontinued, Historical Med    NON FORMULARY Place 1 drop into both eyes as needed (for dry eye relief). Restora - OTC, Until Discontinued, Historical Med    pravastatin (PRAVACHOL) 20 MG tablet Take 20 mg by mouth every morning. , Until Discontinued, Historical Med    Probiotic Product (PROBIOTIC DAILY PO) Take 1 tablet by mouth daily., Until Discontinued, Historical Med    sildenafil (VIAGRA) 50 MG tablet Take 50 mg by mouth daily as needed for erectile dysfunction., Until Discontinued, Historical Med    tamsulosin (FLOMAX) 0.4 MG CAPS capsule Take 1 capsule by mouth every evening. , Starting 11/26/2013, Until Discontinued, Historical Med    nitroGLYCERIN (NITROSTAT) 0.4 MG SL tablet Place 0.4 mg under the tongue every 5 (five) minutes as needed. For chest pains, Until Discontinued, Historical Med      STOP taking these medications     aspirin 81 MG tablet        Allergies  Allergen Reactions  . Contrast Media [Iodinated Diagnostic Agents]     Hives, needs pre meds  . Other Itching and Other (See Comments)    Contrast dye erythema    The results of significant diagnostics from this hospitalization (including imaging, microbiology, ancillary and laboratory) are listed below for reference.    Significant Diagnostic Studies: US Abdomen Complete  03/24/2014   CLINICAL DATA:  Recurrent cholangitis.  EXAM: ULTRASOUND ABDOMEN COMPLETE  COMPARISON:  CT abdomen pelvis 07/29/2013.  FINDINGS: Gallbladder: Surgically absent.  Common bile duct: Diameter: Suboptimally visualized due to bowel gas.  Liver: Appears echogenic with a lobulated contour. Postoperative changes in the right hepatic lobe better seen on 07/29/2013.  IVC: Obscured by bowel  gas.  Pancreas: Obscured by bowel gas.  Spleen: 10.9 cm, within normal limits.  Right Kidney: Length: 12.2 cm. Parenchymal echogenicity is within normal limits. No hydronephrosis. No focal lesion.  Left Kidney: Length: 13.3 cm. Parenchymal echogenicity is within normal limits. No hydronephrosis. An anechoic lesion with increased through transmission measures 2.9 x 2.2 x 2.9 cm, consistent with a cyst.  Abdominal aorta: Mid abdominal aorta measures up to 3.6 cm. Left common iliac artery measures up to 1.7 cm.  Other findings: None.  IMPRESSION: 1. Postoperative changes in the liver. Liver appears dense and lobulated. 2. Small abdominal aortic aneurysm.   Electronically Signed   By: Lorin Picket M.D.   On: 03/24/2014 11:47   Mr 3d Recon At Scanner  03/25/2014   CLINICAL DATA:  Elevated liver function tests. Recurrent cholangitis. Liver abscess. Previous partial hepatectomy.  EXAM: MRI ABDOMEN WITHOUT AND WITH CONTRAST (INCLUDING MRCP)  TECHNIQUE: Multiplanar multisequence MR imaging of the abdomen was performed both before and after the administration of intravenous contrast. Heavily T2-weighted images of the biliary and pancreatic ducts were obtained, and three-dimensional MRCP images were rendered by post processing.  CONTRAST:  52mL MULTIHANCE GADOBENATE DIMEGLUMINE 529 MG/ML IV SOLN  COMPARISON:  CT on 07/29/2013  FINDINGS: Lower chest:  Unremarkable.  Hepatobiliary: Postop changes from previous right hepatectomy again noted. No liver mass or abscess identified.  Pancreas: No mass, inflammatory changes, or other parenchymal abnormality identified. No evidence of pancreatic ductal dilatation.  Spleen:  Within normal limits in size and appearance.  Adrenal Glands:  No mass identified.  Kidneys: No masses identified. Left lower pole renal cyst noted. No evidence of hydronephrosis.  Stomach/Bowel/Peritoneum: Visualized portions within the abdomen are unremarkable.  Vascular/Lymphatic: No pathologically enlarged  lymph nodes identified. Mild abdominal aortic aneurysm measuring 3.0 cm appears stable.  Other:  None.  Musculoskeletal:  No suspicious bone lesions identified.  IMPRESSION: Stable postop changes from prior right hepatectomy. No evidence of hepatic mass, abscess, or other acute findings.  No evidence of biliary ductal dilatation.  Stable 3.0 cm abdominal aortic aneurysm. Recommend followup by [place] in 3 years. This recommendation follows ACR consensus guidelines: White Paper of the ACR Incidental Findings Committee II on Vascular Findings. Natasha Mead Coll Radiol 2013; 10:789-794   Electronically Signed   By: Earle Gell M.D.   On: 03/25/2014 14:43   Dg Abd Acute W/chest  03/23/2014   CLINICAL DATA:  Fever.  Chills.  Vomiting.  Abdominal pain.  EXAM: ACUTE ABDOMEN SERIES (ABDOMEN 2 VIEW & CHEST 1 VIEW)  COMPARISON:  None.  FINDINGS: There is no evidence of dilated bowel loops or free intraperitoneal air. Multiple surgical clips seen in right upper quadrant. No radiopaque calculi identified.  Heart size and mediastinal contours are within normal limits. Both lungs are clear.  IMPRESSION: No acute findings.   Electronically Signed   By: Earle Gell M.D.   On: 03/23/2014 19:29   Microbiology: Recent Results (from the past 240 hour(s))  CULTURE, BLOOD (ROUTINE X 2)     Status: None   Collection Time    03/23/14  7:28 PM      Result Value Ref Range Status   Specimen Description BLOOD RIGHT HAND   Final   Special Requests BOTTLES DRAWN AEROBIC AND ANAEROBIC [place]   Final   Culture  Setup Time     Final   Value: 03/24/2014 18:06     Performed at [facility]   Culture     Final   Value: ESCHERICHIA COLI     Note: Gram Stain Report Called to,Read Back By and Verified With: HAMILTON S AT [id number] ON 03/24/2014 BY Tyrone Schimke M Performed at [facility]     Performed at [facility]   Report Status 03/26/2014 FINAL   Final   Organism ID, Bacteria ESCHERICHIA COLI   Final  CULTURE, BLOOD (ROUTINE  X 2)     Status: None   Collection Time    03/23/14  7:35 PM      Result Value Ref Range Status   Specimen Description BLOOD LEFT ARM   Final   Special Requests BOTTLES DRAWN AEROBIC AND ANAEROBIC [facility]   Final   Culture  Setup Time     Final   Value: 03/24/2014 17:58     Performed at [facility]   Culture     Final   Value: ESCHERICHIA COLI     Note: SUSCEPTIBILITIES PERFORMED ON PREVIOUS CULTURE WITHIN THE LAST 5 DAYS.     Performed at [facility]   Report Status 03/26/2014 FINAL   Final     Labs: Basic Metabolic Panel:  Recent Labs Lab 03/23/14 1850 03/24/14 0527 03/25/14  8329 03/26/14 0607  NA 139 140 142 140  K 4.0 3.7 3.4* 3.4*  CL 101 104 106 105  CO2 24 24 25 26   GLUCOSE 140* 110* 90 89  BUN 14 16 16 13   CREATININE 0.86 0.98 1.14 1.02  CALCIUM 8.7 8.2* 8.2* 8.1*   Liver Function Tests:  Recent Labs Lab 03/23/14 1850 03/24/14 0527 03/25/14 0537 03/26/14 0607  AST 551* 415* 151* 76*  ALT 218* 245* 159* 111*  ALKPHOS 447* 375* 338* 308*  BILITOT 2.6* 4.0* 3.8* 2.0*  PROT 6.6 5.6* 5.6* 5.6*  ALBUMIN 3.5 2.8* 2.7* 2.5*   CBC:  Recent Labs Lab 03/23/14 1850 03/24/14 0527 03/25/14 0537  WBC 8.5 13.2* 6.6  NEUTROABS 8.1*  --   --   HGB 11.7* 10.6* 10.5*  HCT 36.1* 32.8* 32.5*  MCV 83.2 82.8 83.3  PLT 84* 97* 81*    Principal Problem:   Cholangitis, recurrent Active Problems:   Essential hypertension   Liver abscess   CAD S/P percutaneous coronary angioplasty   Fever   Thrombocytopenia   Anemia of chronic disease   Time coordinating discharge: 25 minutes  Signed:  [name], MD [facility] 03/26/2014, 7:25 PM

## 2014-03-26 NOTE — Progress Notes (Signed)
  Subjective:  Patient has no complaints. He ate all of his breakfast. He denies chest pain shortness of breath abdominal pain.   Objective: Blood pressure 136/56, pulse 64, temperature 98.8 F (37.1 C), temperature source Oral, resp. rate 20, height 5\' 11"  (1.803 m), weight 184 lb 8 oz (83.689 kg), SpO2 95.00%. Patient is alert and in no acute distress. Abdomen extensive scarring noted in the right upper quadrant. Abdomen is soft and nontender without organomegaly or masses. No LE edema or clubbing noted.  Labs/studies Results:   Recent Labs  03/23/14 1850 03/24/14 0527 03/25/14 0537  WBC 8.5 13.2* 6.6  HGB 11.7* 10.6* 10.5*  HCT 36.1* 32.8* 32.5*  PLT 84* 97* 81*    BMET   Recent Labs  03/24/14 0527 03/25/14 0537 03/26/14 0607  NA 140 142 140  K 3.7 3.4* 3.4*  CL 104 106 105  CO2 24 25 26   GLUCOSE 110* 90 89  BUN 16 16 13   CREATININE 0.98 1.14 1.02  CALCIUM 8.2* 8.2* 8.1*    LFT   Recent Labs  03/24/14 0527 03/25/14 0537 03/26/14 0607  PROT 5.6* 5.6* 5.6*  ALBUMIN 2.8* 2.7* 2.5*  AST 415* 151* 76*  ALT 245* 159* 111*  ALKPHOS 375* 338* 308*  BILITOT 4.0* 3.8* 2.0*    PT/INR   Recent Labs  03/24/14 0527  LABPROT 16.9*  INR 1.36     Assessment:  #1. Cholangitis secondary to Escherichia coli. 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 Patient has Escherichia coli in blood which is susceptible to most antibiotics including ciprofloxacin. Patient begun on Urso for possible microlithiasis. #2. Anemia secondary to acute illness. #3. Thrombocytopenia most likely due to secondary very cirrhosis    Recommendations;  Patient ready for discharge. Begin Cipro 500 mg by mouth twice a day for 1 week. Follow-up Dr. Colonel Bald at [facility]. I have provided patient with DVD containing ultrasound and MRCP images as well as CT from March 2015. Patient will call if symptoms relapse.

## 2014-04-09 DIAGNOSIS — R17 Unspecified jaundice: Secondary | ICD-10-CM | POA: Diagnosis not present

## 2014-04-09 DIAGNOSIS — K831 Obstruction of bile duct: Secondary | ICD-10-CM | POA: Diagnosis not present

## 2014-04-09 DIAGNOSIS — K830 Cholangitis: Secondary | ICD-10-CM | POA: Diagnosis not present

## 2014-04-09 DIAGNOSIS — Z9049 Acquired absence of other specified parts of digestive tract: Secondary | ICD-10-CM | POA: Diagnosis not present

## 2014-04-15 DIAGNOSIS — K830 Cholangitis: Secondary | ICD-10-CM | POA: Diagnosis not present

## 2014-04-15 DIAGNOSIS — R17 Unspecified jaundice: Secondary | ICD-10-CM | POA: Diagnosis not present

## 2014-04-15 DIAGNOSIS — Z9049 Acquired absence of other specified parts of digestive tract: Secondary | ICD-10-CM | POA: Diagnosis not present

## 2014-04-15 DIAGNOSIS — K831 Obstruction of bile duct: Secondary | ICD-10-CM | POA: Diagnosis not present

## 2014-04-19 DIAGNOSIS — I10 Essential (primary) hypertension: Secondary | ICD-10-CM | POA: Diagnosis not present

## 2014-04-19 DIAGNOSIS — G4730 Sleep apnea, unspecified: Secondary | ICD-10-CM | POA: Diagnosis not present

## 2014-04-19 DIAGNOSIS — K831 Obstruction of bile duct: Secondary | ICD-10-CM | POA: Diagnosis not present

## 2014-04-19 DIAGNOSIS — N400 Enlarged prostate without lower urinary tract symptoms: Secondary | ICD-10-CM | POA: Diagnosis not present

## 2014-04-19 DIAGNOSIS — I2510 Atherosclerotic heart disease of native coronary artery without angina pectoris: Secondary | ICD-10-CM | POA: Diagnosis not present

## 2014-04-19 DIAGNOSIS — E785 Hyperlipidemia, unspecified: Secondary | ICD-10-CM | POA: Diagnosis not present

## 2014-04-19 DIAGNOSIS — Z85820 Personal history of malignant melanoma of skin: Secondary | ICD-10-CM | POA: Diagnosis not present

## 2014-04-23 ENCOUNTER — Telehealth: Payer: Self-pay | Admitting: Oncology

## 2014-04-23 NOTE — Telephone Encounter (Signed)
s.w. pt wife and r/s appt...pt is going back to [facility] he is having problems...Marland Kitchenok with new d.t

## 2014-04-26 DIAGNOSIS — N528 Other male erectile dysfunction: Secondary | ICD-10-CM | POA: Diagnosis not present

## 2014-04-26 DIAGNOSIS — N401 Enlarged prostate with lower urinary tract symptoms: Secondary | ICD-10-CM | POA: Diagnosis not present

## 2014-05-04 ENCOUNTER — Ambulatory Visit: Payer: Medicare Other | Admitting: Oncology

## 2014-05-04 ENCOUNTER — Other Ambulatory Visit: Payer: Medicare Other

## 2014-05-04 DIAGNOSIS — K830 Cholangitis: Secondary | ICD-10-CM | POA: Diagnosis not present

## 2014-05-04 DIAGNOSIS — Z9049 Acquired absence of other specified parts of digestive tract: Secondary | ICD-10-CM | POA: Diagnosis not present

## 2014-05-04 DIAGNOSIS — K831 Obstruction of bile duct: Secondary | ICD-10-CM | POA: Diagnosis not present

## 2014-05-19 ENCOUNTER — Encounter (INDEPENDENT_AMBULATORY_CARE_PROVIDER_SITE_OTHER): Payer: Self-pay

## 2014-05-25 ENCOUNTER — Telehealth: Payer: Self-pay | Admitting: Oncology

## 2014-05-25 NOTE — Telephone Encounter (Signed)
Confirm appt r/s from 06/16/14 to 06/23/14. Mailed cal.

## 2014-06-10 DIAGNOSIS — K831 Obstruction of bile duct: Secondary | ICD-10-CM | POA: Diagnosis not present

## 2014-06-10 DIAGNOSIS — K830 Cholangitis: Secondary | ICD-10-CM | POA: Diagnosis not present

## 2014-06-15 ENCOUNTER — Telehealth: Payer: Self-pay | Admitting: Gastroenterology

## 2014-06-15 DIAGNOSIS — Z434 Encounter for attention to other artificial openings of digestive tract: Secondary | ICD-10-CM | POA: Diagnosis not present

## 2014-06-15 DIAGNOSIS — K831 Obstruction of bile duct: Secondary | ICD-10-CM | POA: Diagnosis not present

## 2014-06-15 DIAGNOSIS — Z4803 Encounter for change or removal of drains: Secondary | ICD-10-CM | POA: Diagnosis not present

## 2014-06-15 DIAGNOSIS — K830 Cholangitis: Secondary | ICD-10-CM | POA: Diagnosis not present

## 2014-06-15 NOTE — Telephone Encounter (Signed)
PT HAD DRAIN PULLED TODAY AFTER CHOLANGIOGRAM SHOWED NO STRICTURE. LEFT [facility] AROUND  3 PM. PT HAD LARGE AMOUNT OF DRAINAGE PRIOR TO DISCHARGE AND NURSE PACKED IT, BUT SINCE BEING AT  HOME CHANGING DRESSING/SHIRT 30 MINS. CALLED DUE TO CONCERN ABOUT LARGE AMOUNT OF BILE COMING FROM SITE. REVIEWED RECORDS FROM [facility]. INSTRUCTED WIFE TO CALL [facility] FOR MANAGEMENT OPTIONS TONIGHT. SHE WILL CALL MY CELL IF SHE IS UNABLE TO CONTACT AN MD.

## 2014-06-16 ENCOUNTER — Other Ambulatory Visit: Payer: Medicare Other

## 2014-06-16 ENCOUNTER — Ambulatory Visit: Payer: Medicare Other | Admitting: Oncology

## 2014-06-16 NOTE — Telephone Encounter (Signed)
I talked with patient's wife earlier today. She did talk with the radiologist at [facility] and was advised to keep site covered with Fraser Din and her dressing and call if drainage does not stop over the next couple of days or if he has pain.

## 2014-06-23 ENCOUNTER — Telehealth: Payer: Self-pay | Admitting: Oncology

## 2014-06-23 ENCOUNTER — Ambulatory Visit (HOSPITAL_BASED_OUTPATIENT_CLINIC_OR_DEPARTMENT_OTHER): Payer: Medicare Other | Admitting: Oncology

## 2014-06-23 ENCOUNTER — Other Ambulatory Visit (HOSPITAL_BASED_OUTPATIENT_CLINIC_OR_DEPARTMENT_OTHER): Payer: Medicare Other

## 2014-06-23 VITALS — BP 130/62 | HR 59 | Temp 97.5°F | Resp 18 | Ht 71.0 in | Wt 189.5 lb

## 2014-06-23 DIAGNOSIS — D696 Thrombocytopenia, unspecified: Secondary | ICD-10-CM | POA: Diagnosis not present

## 2014-06-23 DIAGNOSIS — K830 Cholangitis: Secondary | ICD-10-CM

## 2014-06-23 DIAGNOSIS — N400 Enlarged prostate without lower urinary tract symptoms: Secondary | ICD-10-CM

## 2014-06-23 DIAGNOSIS — Q782 Osteopetrosis: Secondary | ICD-10-CM

## 2014-06-23 DIAGNOSIS — C61 Malignant neoplasm of prostate: Secondary | ICD-10-CM

## 2014-06-23 LAB — CBC WITH DIFFERENTIAL/PLATELET
BASO%: 0.8 % (ref 0.0–2.0)
Basophils Absolute: 0 10*3/uL (ref 0.0–0.1)
EOS%: 2.6 % (ref 0.0–7.0)
Eosinophils Absolute: 0.1 10*3/uL (ref 0.0–0.5)
HCT: 38 % — ABNORMAL LOW (ref 38.4–49.9)
HEMOGLOBIN: 11.8 g/dL — AB (ref 13.0–17.1)
LYMPH%: 18.7 % (ref 14.0–49.0)
MCH: 25.8 pg — AB (ref 27.2–33.4)
MCHC: 31 g/dL — AB (ref 32.0–36.0)
MCV: 83.4 fL (ref 79.3–98.0)
MONO#: 0.5 10*3/uL (ref 0.1–0.9)
MONO%: 9.6 % (ref 0.0–14.0)
NEUT#: 3.5 10*3/uL (ref 1.5–6.5)
NEUT%: 68.3 % (ref 39.0–75.0)
Platelets: 111 10*3/uL — ABNORMAL LOW (ref 140–400)
RBC: 4.55 10*6/uL (ref 4.20–5.82)
RDW: 16 % — ABNORMAL HIGH (ref 11.0–14.6)
WBC: 5.2 10*3/uL (ref 4.0–10.3)
lymph#: 1 10*3/uL (ref 0.9–3.3)

## 2014-06-23 LAB — COMPREHENSIVE METABOLIC PANEL (CC13)
ALBUMIN: 3.3 g/dL — AB (ref 3.5–5.0)
ALK PHOS: 122 U/L (ref 40–150)
ALT: 17 U/L (ref 0–55)
ANION GAP: 9 meq/L (ref 3–11)
AST: 20 U/L (ref 5–34)
BUN: 15.2 mg/dL (ref 7.0–26.0)
CHLORIDE: 107 meq/L (ref 98–109)
CO2: 25 mEq/L (ref 22–29)
CREATININE: 1 mg/dL (ref 0.7–1.3)
Calcium: 8.4 mg/dL (ref 8.4–10.4)
EGFR: 76 mL/min/{1.73_m2} — ABNORMAL LOW (ref >90)
Glucose: 77 mg/dl (ref 70–140)
Potassium: 4.5 mEq/L (ref 3.5–5.1)
Sodium: 142 mEq/L (ref 136–145)
Total Bilirubin: 1.11 mg/dL (ref 0.20–1.20)
Total Protein: 6.1 g/dL — ABNORMAL LOW (ref 6.4–8.3)

## 2014-06-23 NOTE — Telephone Encounter (Signed)
Gave avs & calendar for July °

## 2014-06-23 NOTE — Progress Notes (Signed)
Hematology and Oncology Follow Up Visit  Alexander Burton [id number] 1940-07-17 75 y.o. 06/23/2014 11:51 AM   Principle Diagnosis: 50 -year-old with a history of prostatic enlargement and possible sclerotic bony lesions rule out metastatic malignancy vs. Benign etiology. He also has chronic cholangitis associated with a chronic biliary obstruction. This finding dates back to October of 2012 and followup bone scans failed to reveal clear-cut malignancy.   Interim History: Alexander Burton presents today for a followup visit. He is a pleasant gentleman with a rather complex history of recurrent cholangitis and had an incidental finding of sclerotic bony lesions.  Since his last visit, underwent a biliary stent placement after he developed scar tissue and biliary obstruction. This was done at [facility] and no complications associated with it. He was hospitalized in October 2015 for biliary sepsis and developed thrombocytopenia. He has recovered very well since that time. clinically is feeling well is not reporting any abdominal pain is not reporting any fevers or chills is not reporting any increase in his lower back pain he does not have any neurological symptoms does not have any paresthesias or anesthesia is. He did not have any change in his bowel habits overall he is regaining most activities of daily living. He has not reported any bone pain at this time. The rest of review of systems unremarkable.  Medications: I have reviewed the patient's current medications.  Allergies:  Allergies  Allergen Reactions  . Contrast Media [Iodinated Diagnostic Agents]     Hives, needs pre meds  . Other Itching and Other (See Comments)    Contrast dye erythema     Remaining ROS negative.  Physical Exam: Blood pressure 130/62, pulse 59, temperature 97.5 F (36.4 C), temperature source Oral, resp. rate 18, height 5\' 11"  (1.803 m), weight 189 lb 8 oz (85.957 kg). ECOG: 1 General  appearance: alert and awake not in any distress. Head: Normocephalic, without obvious abnormality Neck: no adenopathy Lymph nodes: Cervical, supraclavicular, and axillary nodes normal. Heart:regular rate and rhythm, no murmurs or gallops Lung:chest clear, no wheezing, rales, normal symmetric air entry. Abdomin: Soft and nontender no hepatosplenomegaly or EXT:no erythema, induration, or nodules   Lab Results: Lab Results  Component Value Date   WBC 5.2 06/23/2014   HGB 11.8* 06/23/2014   HCT 38.0* 06/23/2014   MCV 83.4 06/23/2014   PLT 111* 06/23/2014         Impression and Plan: 74 year old gentleman with the following issues:  1. Questionable sclerotic bony lesions in the spine that could be related to malignancy versus benign bone findings.Bone scans dating back to 2013 and  CT scan of the abdomen on March of 2015 did not show any clear-cut malignancy. It is still very unclear whether we are dealing with an occult malignancy versus benign findings. The plan is to repeat bone scan in July 2016 and if he continues to have stable lesions then no further workup or follow-up will be needed at that time.  2. Recurrent cholangitis: He is status post hepatectomy in September of 2015 . He has no further cholangitis episodes at this time.  3 Thrombocytopenia: Most likely reactive in nature and his blood counts have recovered reasonably well at this time. Systolic count today is 349,179 up from 81,000 in October 2015.  4. Followup: Will be in 6 months sooner if any issues arise.    [facility], MD 1/27/201611:51 AM

## 2014-06-24 ENCOUNTER — Other Ambulatory Visit: Payer: Self-pay | Admitting: Dermatology

## 2014-06-24 DIAGNOSIS — Z86018 Personal history of other benign neoplasm: Secondary | ICD-10-CM | POA: Diagnosis not present

## 2014-06-24 DIAGNOSIS — L821 Other seborrheic keratosis: Secondary | ICD-10-CM | POA: Diagnosis not present

## 2014-06-24 DIAGNOSIS — Z87898 Personal history of other specified conditions: Secondary | ICD-10-CM | POA: Diagnosis not present

## 2014-06-24 DIAGNOSIS — L570 Actinic keratosis: Secondary | ICD-10-CM | POA: Diagnosis not present

## 2014-06-24 DIAGNOSIS — D225 Melanocytic nevi of trunk: Secondary | ICD-10-CM | POA: Diagnosis not present

## 2014-06-24 DIAGNOSIS — D485 Neoplasm of uncertain behavior of skin: Secondary | ICD-10-CM | POA: Diagnosis not present

## 2014-06-24 DIAGNOSIS — D0422 Carcinoma in situ of skin of left ear and external auricular canal: Secondary | ICD-10-CM | POA: Diagnosis not present

## 2014-06-24 DIAGNOSIS — Z85828 Personal history of other malignant neoplasm of skin: Secondary | ICD-10-CM | POA: Diagnosis not present

## 2014-06-30 ENCOUNTER — Telehealth (INDEPENDENT_AMBULATORY_CARE_PROVIDER_SITE_OTHER): Payer: Self-pay | Admitting: *Deleted

## 2014-06-30 NOTE — Telephone Encounter (Signed)
Alexander Burton was given Ursodiol by Dr. [name], then was sent to [facility] and they refilled it. He is needing this medicine refilled at [facility] on [address] in [place].

## 2014-07-01 ENCOUNTER — Other Ambulatory Visit (INDEPENDENT_AMBULATORY_CARE_PROVIDER_SITE_OTHER): Payer: Self-pay | Admitting: *Deleted

## 2014-07-01 NOTE — Telephone Encounter (Signed)
I do not know how much of medication. May want to send to Dr. [name].

## 2014-07-01 NOTE — Telephone Encounter (Signed)
A refill request was sent to Dr.Rehman. 

## 2014-07-01 NOTE — Telephone Encounter (Signed)
Patient's wife called and ask that this be refilled. Patient has been getting through [facility] but now want to get it filled by Dr.Rehman.

## 2014-07-01 NOTE — Telephone Encounter (Signed)
Tammy, will you please take a look at this?

## 2014-07-02 ENCOUNTER — Other Ambulatory Visit (INDEPENDENT_AMBULATORY_CARE_PROVIDER_SITE_OTHER): Payer: Self-pay | Admitting: Internal Medicine

## 2014-07-02 MED ORDER — URSODIOL 300 MG PO CAPS: 300.0000 mg | ORAL_CAPSULE | Freq: Two times a day (BID) | ORAL | Status: DC

## 2014-07-23 DIAGNOSIS — K831 Obstruction of bile duct: Secondary | ICD-10-CM | POA: Diagnosis not present

## 2014-07-23 DIAGNOSIS — K830 Cholangitis: Secondary | ICD-10-CM | POA: Diagnosis not present

## 2014-08-04 ENCOUNTER — Encounter (INDEPENDENT_AMBULATORY_CARE_PROVIDER_SITE_OTHER): Payer: Self-pay | Admitting: *Deleted

## 2014-08-16 ENCOUNTER — Ambulatory Visit (INDEPENDENT_AMBULATORY_CARE_PROVIDER_SITE_OTHER): Payer: Medicare Other | Admitting: Internal Medicine

## 2014-08-16 ENCOUNTER — Telehealth (INDEPENDENT_AMBULATORY_CARE_PROVIDER_SITE_OTHER): Payer: Self-pay | Admitting: *Deleted

## 2014-08-16 NOTE — Telephone Encounter (Signed)
Forwarded to Terri to address as she is the Provider in office today.

## 2014-08-16 NOTE — Telephone Encounter (Signed)
[name], wife, said Alexander Burton's coloring has changed, temp is 101 and no swelling. He is a liver patient of Dr. Olevia Perches that has had part of his liver removed. She has Cipro on hand and was told not to give it to him unless she has instructions from the doctor. Would like for a return call at [phone].

## 2014-08-17 NOTE — Telephone Encounter (Signed)
No answer at home #

## 2014-08-18 NOTE — Telephone Encounter (Signed)
He does not have a fever today. Feels better.Ate a good dinner last night.

## 2014-09-08 DIAGNOSIS — H02834 Dermatochalasis of left upper eyelid: Secondary | ICD-10-CM | POA: Diagnosis not present

## 2014-09-08 DIAGNOSIS — D3132 Benign neoplasm of left choroid: Secondary | ICD-10-CM | POA: Diagnosis not present

## 2014-09-08 DIAGNOSIS — H2513 Age-related nuclear cataract, bilateral: Secondary | ICD-10-CM | POA: Diagnosis not present

## 2014-09-08 DIAGNOSIS — H02831 Dermatochalasis of right upper eyelid: Secondary | ICD-10-CM | POA: Diagnosis not present

## 2014-09-08 DIAGNOSIS — H04123 Dry eye syndrome of bilateral lacrimal glands: Secondary | ICD-10-CM | POA: Diagnosis not present

## 2014-10-04 ENCOUNTER — Ambulatory Visit (INDEPENDENT_AMBULATORY_CARE_PROVIDER_SITE_OTHER): Payer: Medicare Other | Admitting: Internal Medicine

## 2014-10-04 DIAGNOSIS — H2511 Age-related nuclear cataract, right eye: Secondary | ICD-10-CM | POA: Diagnosis not present

## 2014-10-19 DIAGNOSIS — H2511 Age-related nuclear cataract, right eye: Secondary | ICD-10-CM | POA: Diagnosis not present

## 2014-10-22 DIAGNOSIS — T07 Unspecified multiple injuries: Secondary | ICD-10-CM | POA: Diagnosis not present

## 2014-10-22 DIAGNOSIS — R112 Nausea with vomiting, unspecified: Secondary | ICD-10-CM | POA: Diagnosis not present

## 2014-10-22 DIAGNOSIS — R109 Unspecified abdominal pain: Secondary | ICD-10-CM | POA: Diagnosis not present

## 2014-10-22 DIAGNOSIS — R8299 Other abnormal findings in urine: Secondary | ICD-10-CM | POA: Diagnosis not present

## 2014-11-01 DIAGNOSIS — H2511 Age-related nuclear cataract, right eye: Secondary | ICD-10-CM | POA: Diagnosis not present

## 2014-11-05 DIAGNOSIS — Z9049 Acquired absence of other specified parts of digestive tract: Secondary | ICD-10-CM | POA: Diagnosis not present

## 2014-11-05 DIAGNOSIS — K831 Obstruction of bile duct: Secondary | ICD-10-CM | POA: Diagnosis not present

## 2014-11-05 DIAGNOSIS — K830 Cholangitis: Secondary | ICD-10-CM | POA: Diagnosis not present

## 2014-11-30 DIAGNOSIS — H3531 Nonexudative age-related macular degeneration: Secondary | ICD-10-CM | POA: Diagnosis not present

## 2014-11-30 DIAGNOSIS — H35373 Puckering of macula, bilateral: Secondary | ICD-10-CM | POA: Diagnosis not present

## 2014-12-01 ENCOUNTER — Other Ambulatory Visit: Payer: Self-pay | Admitting: Cardiology

## 2014-12-01 NOTE — Telephone Encounter (Signed)
Rx(s) sent to pharmacy electronically.  

## 2014-12-06 DIAGNOSIS — N528 Other male erectile dysfunction: Secondary | ICD-10-CM | POA: Diagnosis not present

## 2014-12-06 DIAGNOSIS — N401 Enlarged prostate with lower urinary tract symptoms: Secondary | ICD-10-CM | POA: Diagnosis not present

## 2014-12-06 DIAGNOSIS — R319 Hematuria, unspecified: Secondary | ICD-10-CM | POA: Diagnosis not present

## 2014-12-06 DIAGNOSIS — R972 Elevated prostate specific antigen [PSA]: Secondary | ICD-10-CM | POA: Diagnosis not present

## 2014-12-14 DIAGNOSIS — N401 Enlarged prostate with lower urinary tract symptoms: Secondary | ICD-10-CM | POA: Diagnosis not present

## 2014-12-14 DIAGNOSIS — R509 Fever, unspecified: Secondary | ICD-10-CM | POA: Diagnosis not present

## 2014-12-14 DIAGNOSIS — R938 Abnormal findings on diagnostic imaging of other specified body structures: Secondary | ICD-10-CM | POA: Diagnosis not present

## 2014-12-14 DIAGNOSIS — Z792 Long term (current) use of antibiotics: Secondary | ICD-10-CM | POA: Diagnosis not present

## 2014-12-14 DIAGNOSIS — R319 Hematuria, unspecified: Secondary | ICD-10-CM | POA: Diagnosis not present

## 2014-12-14 DIAGNOSIS — Z9049 Acquired absence of other specified parts of digestive tract: Secondary | ICD-10-CM | POA: Diagnosis not present

## 2014-12-14 DIAGNOSIS — K830 Cholangitis: Secondary | ICD-10-CM | POA: Diagnosis not present

## 2014-12-14 DIAGNOSIS — D090 Carcinoma in situ of bladder: Secondary | ICD-10-CM | POA: Diagnosis not present

## 2014-12-14 DIAGNOSIS — K831 Obstruction of bile duct: Secondary | ICD-10-CM | POA: Diagnosis not present

## 2014-12-15 ENCOUNTER — Encounter (HOSPITAL_COMMUNITY): Payer: Medicare Other

## 2014-12-15 ENCOUNTER — Other Ambulatory Visit: Payer: Medicare Other

## 2014-12-17 ENCOUNTER — Encounter (INDEPENDENT_AMBULATORY_CARE_PROVIDER_SITE_OTHER): Payer: Self-pay

## 2014-12-22 ENCOUNTER — Ambulatory Visit: Payer: Medicare Other | Admitting: Oncology

## 2014-12-24 DIAGNOSIS — K831 Obstruction of bile duct: Secondary | ICD-10-CM | POA: Diagnosis not present

## 2014-12-24 DIAGNOSIS — I2510 Atherosclerotic heart disease of native coronary artery without angina pectoris: Secondary | ICD-10-CM | POA: Diagnosis not present

## 2014-12-24 DIAGNOSIS — E785 Hyperlipidemia, unspecified: Secondary | ICD-10-CM | POA: Diagnosis not present

## 2014-12-24 DIAGNOSIS — Z87891 Personal history of nicotine dependence: Secondary | ICD-10-CM | POA: Diagnosis not present

## 2014-12-24 DIAGNOSIS — Z79899 Other long term (current) drug therapy: Secondary | ICD-10-CM | POA: Diagnosis not present

## 2014-12-24 DIAGNOSIS — G4730 Sleep apnea, unspecified: Secondary | ICD-10-CM | POA: Diagnosis not present

## 2014-12-24 DIAGNOSIS — N400 Enlarged prostate without lower urinary tract symptoms: Secondary | ICD-10-CM | POA: Diagnosis not present

## 2014-12-24 DIAGNOSIS — Z955 Presence of coronary angioplasty implant and graft: Secondary | ICD-10-CM | POA: Diagnosis not present

## 2014-12-24 DIAGNOSIS — K830 Cholangitis: Secondary | ICD-10-CM | POA: Diagnosis not present

## 2014-12-24 DIAGNOSIS — I739 Peripheral vascular disease, unspecified: Secondary | ICD-10-CM | POA: Diagnosis not present

## 2014-12-24 DIAGNOSIS — Z85820 Personal history of malignant melanoma of skin: Secondary | ICD-10-CM | POA: Diagnosis not present

## 2014-12-24 DIAGNOSIS — I10 Essential (primary) hypertension: Secondary | ICD-10-CM | POA: Diagnosis not present

## 2014-12-24 DIAGNOSIS — Z7982 Long term (current) use of aspirin: Secondary | ICD-10-CM | POA: Diagnosis not present

## 2014-12-25 DIAGNOSIS — K831 Obstruction of bile duct: Secondary | ICD-10-CM | POA: Diagnosis not present

## 2014-12-25 DIAGNOSIS — Z85820 Personal history of malignant melanoma of skin: Secondary | ICD-10-CM | POA: Diagnosis not present

## 2014-12-25 DIAGNOSIS — K830 Cholangitis: Secondary | ICD-10-CM | POA: Diagnosis not present

## 2014-12-25 DIAGNOSIS — G4730 Sleep apnea, unspecified: Secondary | ICD-10-CM | POA: Diagnosis not present

## 2014-12-25 DIAGNOSIS — I2510 Atherosclerotic heart disease of native coronary artery without angina pectoris: Secondary | ICD-10-CM | POA: Diagnosis not present

## 2014-12-25 DIAGNOSIS — Z9049 Acquired absence of other specified parts of digestive tract: Secondary | ICD-10-CM | POA: Diagnosis not present

## 2014-12-25 DIAGNOSIS — I10 Essential (primary) hypertension: Secondary | ICD-10-CM | POA: Diagnosis not present

## 2015-01-03 DIAGNOSIS — R319 Hematuria, unspecified: Secondary | ICD-10-CM | POA: Diagnosis not present

## 2015-01-04 DIAGNOSIS — R319 Hematuria, unspecified: Secondary | ICD-10-CM | POA: Diagnosis not present

## 2015-01-04 DIAGNOSIS — N403 Nodular prostate with lower urinary tract symptoms: Secondary | ICD-10-CM | POA: Diagnosis not present

## 2015-01-10 ENCOUNTER — Encounter (INDEPENDENT_AMBULATORY_CARE_PROVIDER_SITE_OTHER): Payer: Self-pay | Admitting: Internal Medicine

## 2015-01-10 ENCOUNTER — Ambulatory Visit (INDEPENDENT_AMBULATORY_CARE_PROVIDER_SITE_OTHER): Payer: Medicare Other | Admitting: Internal Medicine

## 2015-01-10 ENCOUNTER — Other Ambulatory Visit (INDEPENDENT_AMBULATORY_CARE_PROVIDER_SITE_OTHER): Payer: Self-pay | Admitting: *Deleted

## 2015-01-10 VITALS — BP 110/72 | HR 68 | Temp 97.1°F | Resp 18 | Ht 71.0 in | Wt 183.9 lb

## 2015-01-10 DIAGNOSIS — R630 Anorexia: Secondary | ICD-10-CM | POA: Diagnosis not present

## 2015-01-10 DIAGNOSIS — R1310 Dysphagia, unspecified: Secondary | ICD-10-CM

## 2015-01-10 DIAGNOSIS — K5900 Constipation, unspecified: Secondary | ICD-10-CM

## 2015-01-10 MED ORDER — DRONABINOL 2.5 MG PO CAPS: 2.5000 mg | ORAL_CAPSULE | Freq: Two times a day (BID) | ORAL | Status: DC

## 2015-01-10 MED ORDER — DOCUSATE SODIUM 100 MG PO CAPS: 200.0000 mg | ORAL_CAPSULE | Freq: Every day | ORAL | Status: DC

## 2015-01-10 MED ORDER — BENEFIBER DRINK MIX PO PACK: 4.0000 g | PACK | Freq: Every day | ORAL | Status: DC

## 2015-01-10 NOTE — Progress Notes (Signed)
Presenting complaint;  Solid food dysphagia anorexia and constipation.  Subjective:  Patient is 74 year old Caucasian male who is here for scheduled visit. He was last seen in the office in September 2015. He was hospitalized in October 2015 for acute cholangitis secondary to Escherichia coli. He responded to anti-biotic therapy. 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 Stent was placed in December 2015. He underwent repeat procedure on 12/24/2014 with placement of 5 internal/external stent and has done well since then. He is scheduled to have tube study on 02/02/2015 and hoping the stent will be removed. He complains of poor appetite. His wife states when he was sick several months ago he lost 10 pounds but gradually he scanned back. He feels weak. He denies abdominal pain nausea vomiting melena or rectal bleeding. Today he complains of dysphagia to solids which she's had intermittently for the last few months. He also has noted hoarseness. Heartburns well controlled with therapy.    Current Medications: Outpatient Encounter Prescriptions as of 01/10/2015  Medication Sig  . aspirin EC 81 MG tablet Take 81 mg by mouth every evening.  . cycloSPORINE (RESTASIS) 0.05 % ophthalmic emulsion Place 1 drop into both eyes daily.  Marland Kitchen esomeprazole (NEXIUM) 40 MG capsule Take 40 mg by mouth daily before breakfast.    . finasteride (PROSCAR) 5 MG tablet Take 5 mg by mouth every morning.   . metoprolol tartrate (LOPRESSOR) 25 MG tablet Take 0.5 tablets (12.5 mg total) by mouth 2 (two) times daily. NEEDS APPOINTMENT FOR FUTURE REFILLS  . Multiple Vitamin (MULITIVITAMIN WITH MINERALS) TABS Take 1 tablet by mouth every morning.   . Multiple Vitamins-Minerals (PRESERVISION AREDS) CAPS Take by mouth daily.  . pravastatin  (PRAVACHOL) 20 MG tablet Take 1 tablet (20 mg total) by mouth daily. NEEDS APPOINTMENT FOR FUTURE REFILLS  . Probiotic Product (PROBIOTIC DAILY PO) Take 1 tablet by mouth daily.  . tamsulosin (FLOMAX) 0.4 MG CAPS capsule Take 1 capsule by mouth every evening.   . ursodiol (ACTIGALL) 300 MG capsule Take 1 capsule (300 mg total) by mouth 2 (two) times daily.  . [DISCONTINUED] naproxen sodium (ANAPROX) 220 MG tablet Take 220 mg by mouth as needed. For pain   No facility-administered encounter medications on file as of 01/10/2015.     Objective: Blood pressure 110/72, pulse 68, temperature 97.1 F (36.2 C), temperature source Oral, resp. rate 18, height 5\' 11"  (1.803 m), weight 183 lb 14.4 oz (83.416 kg). Patient is alert and in no acute distress. Conjunctiva is pink. Sclera is nonicteric Oropharyngeal mucosa is normal. No neck masses or thyromegaly noted. Cardiac exam with regular rhythm normal S1 and S2. No murmur or gallop noted. Lungs are clear to auscultation. Abdomen. He has external/internal biliary stent in place. Entry site is anterior abdomen to right of midline. Abdomen is soft and nontender without organomegaly or masses.  No LE edema or clubbing noted.  Labs/studies Results: Chemistry profile from 12/25/2014 reviewed([facility]). LFTs normal except albumin 2.5    Assessment:  #1. Solid food dysphagia. Heartburns well controlled with therapy. He possibly has recurrent Schatzki's ring. Last EGD EGD was in December 2009. #2. Anorexia secondary to chronic illness. Recent serum albumin was low. If appetite does not improve will consider pre-albumin. #3. History of recurrent cholangitis secondary to remote bile duct injury and now with recurrent stricture.  He has external/internal stent in place. He is scheduled for tubes study on 02/02/2015. #4. Constipation secondary to medications and low fiber diet.   Plan:  Esophagogastroduodenoscopy with esophageal dilation to be scheduled near  future. Marinol 2.5 mg by mouth twice a day prescription given for one month with 2 refills. Patient encouraged to increase physical activity and had her walk at least 4 times a week gradually increasing to 30 minute each time. Patient will call if he has fever chills or right upper quadrant abdominal pain. Benefiber 4 g by mouth daily at bedtime. Colace 200 mg by mouth daily at bedtime. Office visit in 6 months.

## 2015-01-10 NOTE — Patient Instructions (Signed)
Esophagogastroduodenoscopy with esophageal dilation to be scheduled. Remember to crease physical activity and try to walk at least 4 times a week for at least 30 minutes each time medications start slow.

## 2015-01-17 DIAGNOSIS — N403 Nodular prostate with lower urinary tract symptoms: Secondary | ICD-10-CM | POA: Diagnosis not present

## 2015-01-17 DIAGNOSIS — R972 Elevated prostate specific antigen [PSA]: Secondary | ICD-10-CM | POA: Diagnosis not present

## 2015-01-17 DIAGNOSIS — R319 Hematuria, unspecified: Secondary | ICD-10-CM | POA: Diagnosis not present

## 2015-01-17 DIAGNOSIS — R351 Nocturia: Secondary | ICD-10-CM | POA: Diagnosis not present

## 2015-01-20 ENCOUNTER — Ambulatory Visit (HOSPITAL_COMMUNITY)
Admission: RE | Admit: 2015-01-20 | Discharge: 2015-01-20 | Disposition: A | Discharge status: Home / Self Care | Payer: Medicare Other | Source: Ambulatory Visit | Attending: Internal Medicine | Admitting: Internal Medicine

## 2015-01-20 ENCOUNTER — Encounter (INDEPENDENT_AMBULATORY_CARE_PROVIDER_SITE_OTHER): Payer: Self-pay | Admitting: *Deleted

## 2015-01-20 ENCOUNTER — Encounter (HOSPITAL_COMMUNITY)
Admission: RE | Disposition: A | Discharge status: Home / Self Care | Payer: Self-pay | Source: Ambulatory Visit | Attending: Internal Medicine

## 2015-01-20 ENCOUNTER — Encounter (HOSPITAL_COMMUNITY): Payer: Self-pay | Admitting: *Deleted

## 2015-01-20 DIAGNOSIS — Z87891 Personal history of nicotine dependence: Secondary | ICD-10-CM | POA: Diagnosis not present

## 2015-01-20 DIAGNOSIS — E785 Hyperlipidemia, unspecified: Secondary | ICD-10-CM | POA: Diagnosis not present

## 2015-01-20 DIAGNOSIS — J449 Chronic obstructive pulmonary disease, unspecified: Secondary | ICD-10-CM | POA: Diagnosis not present

## 2015-01-20 DIAGNOSIS — Z955 Presence of coronary angioplasty implant and graft: Secondary | ICD-10-CM | POA: Diagnosis not present

## 2015-01-20 DIAGNOSIS — G4730 Sleep apnea, unspecified: Secondary | ICD-10-CM | POA: Insufficient documentation

## 2015-01-20 DIAGNOSIS — K222 Esophageal obstruction: Secondary | ICD-10-CM | POA: Diagnosis not present

## 2015-01-20 DIAGNOSIS — Z8546 Personal history of malignant neoplasm of prostate: Secondary | ICD-10-CM | POA: Insufficient documentation

## 2015-01-20 DIAGNOSIS — Z7982 Long term (current) use of aspirin: Secondary | ICD-10-CM | POA: Diagnosis not present

## 2015-01-20 DIAGNOSIS — K317 Polyp of stomach and duodenum: Secondary | ICD-10-CM | POA: Insufficient documentation

## 2015-01-20 DIAGNOSIS — I2510 Atherosclerotic heart disease of native coronary artery without angina pectoris: Secondary | ICD-10-CM | POA: Diagnosis not present

## 2015-01-20 DIAGNOSIS — R1310 Dysphagia, unspecified: Secondary | ICD-10-CM | POA: Diagnosis not present

## 2015-01-20 DIAGNOSIS — Z79899 Other long term (current) drug therapy: Secondary | ICD-10-CM | POA: Insufficient documentation

## 2015-01-20 DIAGNOSIS — D131 Benign neoplasm of stomach: Secondary | ICD-10-CM | POA: Diagnosis not present

## 2015-01-20 DIAGNOSIS — K219 Gastro-esophageal reflux disease without esophagitis: Secondary | ICD-10-CM | POA: Insufficient documentation

## 2015-01-20 HISTORY — PX: ESOPHAGEAL DILATION: SHX303

## 2015-01-20 HISTORY — PX: ESOPHAGOGASTRODUODENOSCOPY: SHX5428

## 2015-01-20 SURGERY — EGD (ESOPHAGOGASTRODUODENOSCOPY)
Anesthesia: Moderate Sedation

## 2015-01-20 MED ORDER — BUTAMBEN-TETRACAINE-BENZOCAINE 2-2-14 % EX AERO
INHALATION_SPRAY | CUTANEOUS | Status: DC | PRN
  Administered 2015-01-20: 2 via TOPICAL

## 2015-01-20 MED ORDER — MIDAZOLAM HCL 5 MG/5ML IJ SOLN
INTRAMUSCULAR | Status: DC | PRN
  Administered 2015-01-20 (×2): 1 mg via INTRAVENOUS
  Administered 2015-01-20: 2 mg via INTRAVENOUS
  Administered 2015-01-20: 1 mg via INTRAVENOUS

## 2015-01-20 MED ORDER — MEPERIDINE HCL 50 MG/ML IJ SOLN
INTRAMUSCULAR | Status: DC | PRN
  Administered 2015-01-20 (×2): 25 mg via INTRAVENOUS

## 2015-01-20 MED ORDER — MIDAZOLAM HCL 5 MG/5ML IJ SOLN
INTRAMUSCULAR | Status: AC
  Filled 2015-01-20: qty 10

## 2015-01-20 MED ORDER — STERILE WATER FOR IRRIGATION IR SOLN
Status: DC | PRN
  Administered 2015-01-20: 12:00:00

## 2015-01-20 MED ORDER — SODIUM CHLORIDE 0.9 % IV SOLN
INTRAVENOUS | Status: DC
  Administered 2015-01-20: 11:00:00 via INTRAVENOUS

## 2015-01-20 MED ORDER — MEPERIDINE HCL 50 MG/ML IJ SOLN
INTRAMUSCULAR | Status: AC
  Filled 2015-01-20: qty 1

## 2015-01-20 NOTE — Op Note (Signed)
EGD PROCEDURE REPORT  PATIENT:  Alexander Burton  MR#:  [id number] Birthdate:  1940-08-17, 74 y.o., male Endoscopist:  Dr. Rogene Houston, MD  Procedure Date: 01/20/2015  Procedure:   EGD with ED  Indications:  Patient is 49 old Caucasian male was chronic GERD with satisfactory symptom control who presents with recent onset of solid food dysphagia. He did undergo esophageal dilation over 7 years ago.            Informed Consent:  The risks, benefits, alternatives & imponderables which include, but are not limited to, bleeding, infection, perforation, drug reaction and potential missed lesion have been reviewed.  The potential for biopsy, lesion removal, esophageal dilation, etc. have also been discussed.  Questions have been answered.  All parties agreeable.  Please see history & physical in medical record for more information.  Medications:  Demerol 50 mg IV Versed 5 mg IV Cetacaine spray topically for oropharyngeal anesthesia  Description of procedure:  The endoscope was introduced through the mouth and advanced to the second portion of the duodenum without difficulty or limitations. The mucosal surfaces were surveyed very carefully during advancement of the scope and upon withdrawal.  Findings:  Esophagus:  Mucosa of the esophagus was normal. Soft stricture noted at GE junction. Scope passed across it without any resistance. GEJ:  40 cm Stomach:  Stomach was empty and distended very well with insufflation. Folds in the proximal stomach were normal. Examination of mucosa at gastric by you revealed 2 small hyperplastic appearing polyps. These are left alone. Antral mucosa was unremarkable. Pyloric channel was patent. Angularis fundus and cardia but also examined and were normal. Duodenum:  Normal bulbar and post bulbar mucosa.  Therapeutic/Diagnostic Maneuvers Performed:   Esophagus was dilated by passing 56 [place] Maloney dilator to full insertion. As the dilator was withdrawn endoscope  was passed again and short linear mucosal disruption noted at GE junction along with another one at cervical esophagus just below UES.  Complications:  None  Impression: Soft stricture at GE junction and possible esophageal web. Two small hyperplastic appearing polyps at gastric body. These were left alone. Esophagus dilated by passing 71 [place] Maloney dilator resulting in mucosal disruption at GE junction along with [place] very small linear disruption at cervical esophagus.  Recommendations:  Standard instructions given. Patient will resume aspirin on 01/22/2015. Patient advised to call office with progress report in one week.  Celisa Schoenberg U  01/20/2015  12:30 PM  CC: Dr. [name], MD & Dr. Rayne Du ref. provider found

## 2015-01-20 NOTE — Discharge Instructions (Signed)
Resume aspirin on August on 01/22/2015 Resume other medications as before. Resume usual diet. No driving for 24 hours. Please call office with progress report in one week.  Gastrointestinal Endoscopy, Care After Refer to this sheet in the next few weeks. These instructions provide you with information on caring for yourself after your procedure. Your caregiver may also give you more specific instructions. Your treatment has been planned according to current medical practices, but problems sometimes occur. Call your caregiver if you have any problems or questions after your procedure. HOME CARE INSTRUCTIONS  If you were given medicine to help you relax (sedative), do not drive, operate machinery, or sign important documents for 24 hours.  Avoid alcohol and hot or warm beverages for the first 24 hours after the procedure.  Only take over-the-counter or prescription medicines for pain, discomfort, or fever as directed by your caregiver. You may resume taking your normal medicines unless your caregiver tells you otherwise. Ask your caregiver when you may resume taking medicines that may cause bleeding, such as aspirin, clopidogrel, or warfarin.  You may return to your normal diet and activities on the day after your procedure, or as directed by your caregiver. Walking may help to reduce any bloated feeling in your abdomen.  Drink enough fluids to keep your urine clear or pale yellow.  You may gargle with salt water if you have a sore throat. SEEK IMMEDIATE MEDICAL CARE IF:  You have severe nausea or vomiting.  You have severe abdominal pain, abdominal cramps that last longer than 6 hours, or abdominal swelling (distention).  You have severe shoulder or back pain.  You have trouble swallowing.  You have shortness of breath, your breathing is shallow, or you are breathing faster than normal.  You have a fever or a rapid heartbeat.  You vomit blood or material that looks like coffee  grounds.  You have bloody, black, or tarry stools. MAKE SURE YOU:  Understand these instructions.  Will watch your condition.  Will get help right away if you are not doing well or get worse. Document Released: 12/27/2003 Document Revised: 09/28/2013 Document Reviewed: 08/14/2011 [facility] Patient Information 2015 [place], [place]. This information is not intended to replace advice given to you by your health care provider. Make sure you discuss any questions you have with your health care provider.

## 2015-01-20 NOTE — H&P (Signed)
Alexander Burton is an 74 y.o. male.   Chief Complaint: Patient's here for EGD and ED. HPI: Patient is 56 old Caucasian male who presents with few months history of dysphagia to solids. He's had intermittent hoarseness but denies frequent heartburn nausea vomiting. He also denies melena. He has also few pounds this year but his weight loss is felt to be secondary to recurrent episodes of cholangitis due to hepaticojejunostomy stricture for which she has internal/external very stent in place. He is scheduled to undergo tube study in 2 weeks at [facility]. Last EGD with ED was in 2009.  Past Medical History  Diagnosis Date  . Prostate ca   . BPH (benign prostatic hyperplasia)   . Gastritis   . Osteoarthritis   . Melanoma     sigmoid diverticulosis  . Goiter   . Colon polyps   . CAD (coronary artery disease), native coronary artery February 2010    mid LAD lesion: PCI with 3.5 mm x 35 mm Cypher DES postdilated to 4.0 mm;   . Hyperlipidemia with target LDL less than 70     For CAD  . PAF (paroxysmal atrial fibrillation)   . COPD (chronic obstructive pulmonary disease)   . Syncope and collapse January 2010    PVC's and14 beats NSVT as well as atrial afib  . Sleep apnea 04/02/2005    nocturnal polysomogram ordered K.Clance  . 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 Presence of drug coated stent in LAD coronary artery     Past Surgical History  Procedure Laterality Date  . Cholecystectomy      complicated by rupture of common bile duct and other vascular structures. Has now had multiple issues with chronic hepatic abscess. Currently has indwelling drain  . Surgical resection, melanoma    . Colonoscopy  08/16/2011    Procedure: COLONOSCOPY;  Surgeon: Rogene Houston, MD;  Location: [facility];  Service: Endoscopy;  Laterality: N/A;  1200  . Cardiac catheterization  Feb 2010    post DES to LAD ,3.5 X 33 mm Cypher stent,dilated up  to 3.8 mm 80 to90% stenosis btwn first and second diag branc w.moderate disease EF 50%  . Doppler echocardiography  JAN 2010    EF 50 to 55% w/boderline concentic LVH,mildly dilated atrium ,frequent PVC  9METS  . Nm myocar perf wall motion  06/09/2008    EF 97% LV systolic fx mildly reduced.Ventricular fx preserved.  [place] Nunnery doppler  07/28/2008    NORMAL RGT GROIN DUPLEX DOPPLER  . Cardionet monitor  06/08/2008-06/24/2008  . Liver surgery  09-03/14    @ [facility]    Family History  Problem Relation Age of Onset  . Cancer Sister     colon  . Colon cancer Neg Hx    Social History:  reports that he quit smoking about 7 years ago. His smoking use included Cigarettes. He has a 75 pack-year smoking history. He has never used smokeless tobacco. He reports that he drinks about 0.6 oz of alcohol per week. He reports that he does not use illicit drugs.  Allergies:  Allergies  Allergen Reactions  . Contrast Media [Iodinated Diagnostic Agents]     Hives, needs pre meds  . Other Itching and Other (See Comments)    Contrast dye erythema    Medications Prior to Admission  Medication Sig Dispense Refill  . aspirin EC 81 MG tablet Take 81 mg by mouth every  evening.    . docusate sodium (COLACE) 100 MG capsule Take 2 capsules (200 mg total) by mouth daily. 10 capsule 0  . dronabinol (MARINOL) 2.5 MG capsule Take 1 capsule (2.5 mg total) by mouth 2 (two) times daily before lunch and supper. 60 capsule 2  . esomeprazole (NEXIUM) 40 MG capsule Take 40 mg by mouth daily before breakfast.      . finasteride (PROSCAR) 5 MG tablet Take 5 mg by mouth every morning.     . metoprolol tartrate (LOPRESSOR) 25 MG tablet Take 0.5 tablets (12.5 mg total) by mouth 2 (two) times daily. NEEDS APPOINTMENT FOR FUTURE REFILLS 90 tablet 0  . Multiple Vitamin (MULITIVITAMIN WITH MINERALS) TABS Take 1 tablet by mouth every morning.     . Multiple Vitamins-Minerals (PRESERVISION AREDS) CAPS Take by mouth daily.    .  pravastatin (PRAVACHOL) 20 MG tablet Take 1 tablet (20 mg total) by mouth daily. NEEDS APPOINTMENT FOR FUTURE REFILLS 90 tablet 0  . Probiotic Product (PROBIOTIC DAILY PO) Take 1 tablet by mouth daily.    . tamsulosin (FLOMAX) 0.4 MG CAPS capsule Take 1 capsule by mouth every evening.     . ursodiol (ACTIGALL) 300 MG capsule Take 1 capsule (300 mg total) by mouth 2 (two) times daily. 180 capsule 3  . Wheat Dextrin (BENEFIBER DRINK MIX) PACK Take 4 g by mouth at bedtime.    . cycloSPORINE (RESTASIS) 0.05 % ophthalmic emulsion Place 1 drop into both eyes daily.      No results found for this or any previous visit (from the past 48 hour(s)). No results found.  ROS  Blood pressure 131/77, pulse 53, temperature 97.5 F (36.4 C), temperature source Oral, resp. rate 15, height 5\' 11"  (1.803 m), weight 183 lb (83.008 kg), SpO2 100 %. Physical Exam  Constitutional: He appears well-developed and well-nourished.  HENT:  Mouth/Throat: Oropharynx is clear and moist.  Eyes: Conjunctivae are normal. No scleral icterus.  Neck: No thyromegaly present.  Cardiovascular: Normal rate, regular rhythm and normal heart sounds.   No murmur heard. Respiratory: Effort normal and breath sounds normal.  GI:  Biliary stent in place entering abdomen below the right costal margin. Abdomen is soft and nontender without organomegaly or masses.  Musculoskeletal: He exhibits no edema.  Lymphadenopathy:    He has no cervical adenopathy.  Neurological: He is alert.  Skin: Skin is warm and dry.     Assessment/Plan Solid food dysphagia. EGD with ED  Martell Mcfadyen U 01/20/2015, 12:05 PM

## 2015-01-25 ENCOUNTER — Encounter (HOSPITAL_COMMUNITY): Payer: Self-pay | Admitting: Internal Medicine

## 2015-02-02 DIAGNOSIS — E785 Hyperlipidemia, unspecified: Secondary | ICD-10-CM | POA: Diagnosis not present

## 2015-02-02 DIAGNOSIS — I2510 Atherosclerotic heart disease of native coronary artery without angina pectoris: Secondary | ICD-10-CM | POA: Diagnosis not present

## 2015-02-02 DIAGNOSIS — K838 Other specified diseases of biliary tract: Secondary | ICD-10-CM | POA: Diagnosis not present

## 2015-02-02 DIAGNOSIS — Z4589 Encounter for adjustment and management of other implanted devices: Secondary | ICD-10-CM | POA: Diagnosis not present

## 2015-02-02 DIAGNOSIS — Z7982 Long term (current) use of aspirin: Secondary | ICD-10-CM | POA: Diagnosis not present

## 2015-02-02 DIAGNOSIS — G4730 Sleep apnea, unspecified: Secondary | ICD-10-CM | POA: Diagnosis not present

## 2015-02-02 DIAGNOSIS — I10 Essential (primary) hypertension: Secondary | ICD-10-CM | POA: Diagnosis not present

## 2015-02-02 DIAGNOSIS — Z79899 Other long term (current) drug therapy: Secondary | ICD-10-CM | POA: Diagnosis not present

## 2015-02-02 DIAGNOSIS — K831 Obstruction of bile duct: Secondary | ICD-10-CM | POA: Diagnosis not present

## 2015-02-02 DIAGNOSIS — Z438 Encounter for attention to other artificial openings: Secondary | ICD-10-CM | POA: Diagnosis not present

## 2015-02-04 DIAGNOSIS — K831 Obstruction of bile duct: Secondary | ICD-10-CM | POA: Diagnosis not present

## 2015-02-04 DIAGNOSIS — Z9049 Acquired absence of other specified parts of digestive tract: Secondary | ICD-10-CM | POA: Diagnosis not present

## 2015-02-04 DIAGNOSIS — K830 Cholangitis: Secondary | ICD-10-CM | POA: Diagnosis not present

## 2015-02-08 DIAGNOSIS — I2510 Atherosclerotic heart disease of native coronary artery without angina pectoris: Secondary | ICD-10-CM | POA: Diagnosis not present

## 2015-02-08 DIAGNOSIS — Z79899 Other long term (current) drug therapy: Secondary | ICD-10-CM | POA: Diagnosis not present

## 2015-02-08 DIAGNOSIS — N529 Male erectile dysfunction, unspecified: Secondary | ICD-10-CM | POA: Diagnosis not present

## 2015-02-08 DIAGNOSIS — E291 Testicular hypofunction: Secondary | ICD-10-CM | POA: Diagnosis not present

## 2015-02-08 DIAGNOSIS — Z7952 Long term (current) use of systemic steroids: Secondary | ICD-10-CM | POA: Diagnosis not present

## 2015-02-08 DIAGNOSIS — K219 Gastro-esophageal reflux disease without esophagitis: Secondary | ICD-10-CM | POA: Diagnosis not present

## 2015-02-08 DIAGNOSIS — R310 Gross hematuria: Secondary | ICD-10-CM | POA: Diagnosis not present

## 2015-02-08 DIAGNOSIS — I480 Paroxysmal atrial fibrillation: Secondary | ICD-10-CM | POA: Diagnosis not present

## 2015-02-08 DIAGNOSIS — Z91041 Radiographic dye allergy status: Secondary | ICD-10-CM | POA: Diagnosis not present

## 2015-02-08 DIAGNOSIS — G4733 Obstructive sleep apnea (adult) (pediatric): Secondary | ICD-10-CM | POA: Diagnosis not present

## 2015-02-08 DIAGNOSIS — N329 Bladder disorder, unspecified: Secondary | ICD-10-CM | POA: Diagnosis not present

## 2015-02-08 DIAGNOSIS — E785 Hyperlipidemia, unspecified: Secondary | ICD-10-CM | POA: Diagnosis not present

## 2015-02-08 DIAGNOSIS — N3020 Other chronic cystitis without hematuria: Secondary | ICD-10-CM | POA: Diagnosis not present

## 2015-02-08 DIAGNOSIS — R319 Hematuria, unspecified: Secondary | ICD-10-CM | POA: Diagnosis not present

## 2015-02-08 DIAGNOSIS — Z87891 Personal history of nicotine dependence: Secondary | ICD-10-CM | POA: Diagnosis not present

## 2015-02-08 DIAGNOSIS — Z7901 Long term (current) use of anticoagulants: Secondary | ICD-10-CM | POA: Diagnosis not present

## 2015-02-08 DIAGNOSIS — I10 Essential (primary) hypertension: Secondary | ICD-10-CM | POA: Diagnosis not present

## 2015-02-08 DIAGNOSIS — R972 Elevated prostate specific antigen [PSA]: Secondary | ICD-10-CM | POA: Diagnosis not present

## 2015-02-08 DIAGNOSIS — N401 Enlarged prostate with lower urinary tract symptoms: Secondary | ICD-10-CM | POA: Diagnosis not present

## 2015-02-08 DIAGNOSIS — Z7982 Long term (current) use of aspirin: Secondary | ICD-10-CM | POA: Diagnosis not present

## 2015-02-08 DIAGNOSIS — Z791 Long term (current) use of non-steroidal anti-inflammatories (NSAID): Secondary | ICD-10-CM | POA: Diagnosis not present

## 2015-02-12 DIAGNOSIS — Z955 Presence of coronary angioplasty implant and graft: Secondary | ICD-10-CM | POA: Diagnosis not present

## 2015-02-12 DIAGNOSIS — Z87891 Personal history of nicotine dependence: Secondary | ICD-10-CM | POA: Diagnosis not present

## 2015-02-12 DIAGNOSIS — Z91041 Radiographic dye allergy status: Secondary | ICD-10-CM | POA: Diagnosis not present

## 2015-02-12 DIAGNOSIS — Z9889 Other specified postprocedural states: Secondary | ICD-10-CM | POA: Diagnosis not present

## 2015-02-12 DIAGNOSIS — I4891 Unspecified atrial fibrillation: Secondary | ICD-10-CM | POA: Diagnosis not present

## 2015-02-12 DIAGNOSIS — R339 Retention of urine, unspecified: Secondary | ICD-10-CM | POA: Diagnosis not present

## 2015-02-12 DIAGNOSIS — Z7982 Long term (current) use of aspirin: Secondary | ICD-10-CM | POA: Diagnosis not present

## 2015-02-12 DIAGNOSIS — Z9049 Acquired absence of other specified parts of digestive tract: Secondary | ICD-10-CM | POA: Diagnosis not present

## 2015-02-12 DIAGNOSIS — K219 Gastro-esophageal reflux disease without esophagitis: Secondary | ICD-10-CM | POA: Diagnosis not present

## 2015-02-12 DIAGNOSIS — E785 Hyperlipidemia, unspecified: Secondary | ICD-10-CM | POA: Diagnosis not present

## 2015-02-12 DIAGNOSIS — I10 Essential (primary) hypertension: Secondary | ICD-10-CM | POA: Diagnosis not present

## 2015-02-12 DIAGNOSIS — Z7952 Long term (current) use of systemic steroids: Secondary | ICD-10-CM | POA: Diagnosis not present

## 2015-02-14 DIAGNOSIS — R339 Retention of urine, unspecified: Secondary | ICD-10-CM | POA: Diagnosis not present

## 2015-02-27 ENCOUNTER — Other Ambulatory Visit: Payer: Self-pay | Admitting: Cardiology

## 2015-02-28 NOTE — Telephone Encounter (Signed)
Rx(s) sent to pharmacy electronically.  

## 2015-03-14 NOTE — Patient Outreach (Signed)
[facility]) Care Management  03/14/2015  Alexander Burton 1940-11-07 [id number]   Referral from [employer] Tier 2 List, assigned [name], RN to outreach for [place] Management services.  Thanks, [name]. [place], [place] Assistant Phone: [phone] Fax: [phone]

## 2015-03-17 ENCOUNTER — Other Ambulatory Visit: Payer: Self-pay

## 2015-03-17 NOTE — Patient Outreach (Signed)
[facility]) Care Management  03/17/2015  COLM LYFORD [birth year] [id number]   Telephone call for Tier 2 referral.  Spoke with wife as patient is hard of hearing and takes care of his medical information per record. Explained [place] [facility] to her.  She denies patient has COPD, DM, or CHF but states patient has some liver issues and is being followed by [facility].  She reports he is doing very well.  She declined services but was receptive to receive information.    Plan: RN Health Coach will send letter and brochure. RN Health Coach will send closure letter to primary doctor RN Health Coach will send in basket to [place]line [place] for case closure.    Jone Baseman, RN, MSN [place] [phone]

## 2015-03-24 NOTE — Patient Outreach (Signed)
[name]) Care Management  03/24/2015  Alexander Burton 1940/11/09 [id number]   Notification from [name], RN to close case due to patient refused [place] Management services.  Thanks, [name]. [place], [place] Assistant Phone: [phone] Fax: [phone]

## 2015-03-30 DIAGNOSIS — Z23 Encounter for immunization: Secondary | ICD-10-CM | POA: Diagnosis not present

## 2015-04-13 ENCOUNTER — Ambulatory Visit (INDEPENDENT_AMBULATORY_CARE_PROVIDER_SITE_OTHER): Payer: Medicare Other | Admitting: Cardiology

## 2015-04-13 ENCOUNTER — Encounter: Payer: Self-pay | Admitting: Cardiology

## 2015-04-13 VITALS — BP 126/66 | HR 73 | Ht 71.0 in | Wt 189.0 lb

## 2015-04-13 DIAGNOSIS — I480 Paroxysmal atrial fibrillation: Secondary | ICD-10-CM | POA: Diagnosis not present

## 2015-04-13 DIAGNOSIS — I2510 Atherosclerotic heart disease of native coronary artery without angina pectoris: Secondary | ICD-10-CM

## 2015-04-13 DIAGNOSIS — Z79899 Other long term (current) drug therapy: Secondary | ICD-10-CM

## 2015-04-13 DIAGNOSIS — E782 Mixed hyperlipidemia: Secondary | ICD-10-CM | POA: Diagnosis not present

## 2015-04-13 MED ORDER — PRAVASTATIN SODIUM 20 MG PO TABS: 20.0000 mg | ORAL_TABLET | Freq: Every day | ORAL | Status: DC

## 2015-04-13 MED ORDER — METOPROLOL TARTRATE 25 MG PO TABS: 12.5000 mg | ORAL_TABLET | Freq: Two times a day (BID) | ORAL | Status: DC

## 2015-04-13 NOTE — Progress Notes (Signed)
Cardiology Office Note  Date: 04/13/2015   ID: [name], [name] [birth year], MRN [id number]  PCP: Wende Neighbors, MD  New Cardiologist: Rozann Lesches, MD   Chief Complaint  Patient presents with  . Coronary Artery Disease  . PAF    History of Present Illness: Alexander Burton is a 74 y.o. male presenting to establish follow-up in the [facility] clinic. This is my first meeting with him in the office. He is a former patient of Dr. Ellyn Hack - last seen in July 2015. I reviewed the chart. He is here today with his wife. From a cardiac perspective, he does not report any angina symptoms. States that he has been somewhat chronically fatigued, not overly short of breath however. He reports an occasional "skipped" heartbeat but no prolonged palpitations, no dizziness or syncope.  Cardiac history is reviewed below. Diagnosis of PAF looks to be based on previous findings, no major recurrences per Dr. Allison Quarry notes, and he has not been anticoagulated. ECG today shows sinus rhythm with PVCs.  He has complex hepatic disease followed at [facility], I reviewed the office note from September in [place]. He is due to follow-up again in December. He does not report any fevers or chills. Appetite has been somewhat limited recently. Otherwise no major changes.  He has not undergone any ischemic testing since 2010 when he underwent stent placement to the LAD. Also no recent lipid panel.   Past Medical History  Diagnosis Date  . Prostate CA ([place])   . BPH (benign prostatic hyperplasia)   . Gastritis   . Osteoarthritis   . Melanoma ([place])   . Goiter   . Colon polyps   . CAD (coronary artery disease)     DES to LAD 06/2008  . Hyperlipidemia   . PAF (paroxysmal atrial fibrillation) ([place])   . COPD (chronic obstructive pulmonary disease) ([place])   . History of syncope January 2010    PVC's and14 beats NSVT as well as atrial afib  . Sleep apnea 04/02/2005  . 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 Cholecystectomy      Complicated by rupture of common bile duct and other vascular structures. Has now had multiple issues with chronic hepatic abscess. Currently has indwelling drain  . Melanoma resection    . Colonoscopy  08/16/2011    Procedure: COLONOSCOPY;  Surgeon: Rogene Houston, MD;  [facility];  Service: Endoscopy;  Laterality: N/A;  1200  . Cardiac catheterization  Feb 2010    post DES to LAD ,3.5 X 33 mm Cypher stent,dilated up to 3.8 mm 80 to90% stenosis btwn first and second diag branc w.moderate disease EF 50%  . Lea doppler  07/28/2008    NORMAL RGT GROIN DUPLEX DOPPLER  . Liver surgery  09-03/14    @ [facility]  . Esophagogastroduodenoscopy N/A 01/20/2015    Procedure: ESOPHAGOGASTRODUODENOSCOPY (EGD);  Surgeon: Rogene Houston, MD;  Location[facility];  Service: Endoscopy;  Laterality: N/A;  1200  . Esophageal dilation N/A 01/20/2015    Procedure: ESOPHAGEAL DILATION;  Surgeon: Rogene Houston, MD;  [facility];  Service: Endoscopy;  Laterality: N/A;    Current Outpatient Prescriptions  Medication Sig Dispense Refill  . aspirin EC 81 MG tablet Take 81 mg by mouth every evening.    . cycloSPORINE (RESTASIS) 0.05 % ophthalmic emulsion Place 1 drop into both eyes daily.    Marland Kitchen  docusate sodium (COLACE) 100 MG capsule Take 2 capsules (200 mg total) by mouth daily. 10 capsule 0  . esomeprazole (NEXIUM) 40 MG capsule Take 40 mg by mouth daily before breakfast.      . finasteride (PROSCAR) 5 MG tablet Take 5 mg by mouth every morning.     . metoprolol tartrate (LOPRESSOR) 25 MG tablet Take 0.5 tablets (12.5 mg total) by mouth 2 (two) times daily. <PLEASE MAKE APPOINTMENT FOR REFILLS> 30 tablet 0  . Multiple Vitamin (MULITIVITAMIN WITH MINERALS) TABS Take 1 tablet by mouth every morning.     . Multiple Vitamins-Minerals (PRESERVISION AREDS) CAPS Take by mouth daily.    .  pravastatin (PRAVACHOL) 20 MG tablet Take 1 tablet (20 mg total) by mouth daily. 90 tablet 3  . Probiotic Product (PROBIOTIC DAILY PO) Take 1 tablet by mouth daily.    . ursodiol (ACTIGALL) 300 MG capsule Take 1 capsule (300 mg total) by mouth 2 (two) times daily. 180 capsule 3   No current facility-administered medications for this visit.    Allergies:  Contrast media and Other   Social History: The patient  reports that he quit smoking about 7 years ago. His smoking use included Cigarettes. He has a 75 pack-year smoking history. He has never used smokeless tobacco. He reports that he drinks about 0.6 oz of alcohol per week. He reports that he does not use illicit drugs.   ROS:  Please see the history of present illness. Otherwise, complete review of systems is positive for none.  All other systems are reviewed and negative.   Physical Exam: VS:  BP 126/66 mmHg  Pulse 73  Ht 5\' 11"  (1.803 m)  Wt 189 lb (85.73 kg)  BMI 26.37 kg/m2  SpO2 96%, BMI Body mass index is 26.37 kg/(m^2).  Wt Readings from Last 3 Encounters:  04/13/15 189 lb (85.73 kg)  01/20/15 183 lb (83.008 kg)  01/10/15 183 lb 14.4 oz (83.416 kg)     General: Appears comfortable at rest. HEENT: Conjunctiva and lids normal, oropharynx clear. Neck: Supple, no elevated JVP or carotid bruits, no thyromegaly. Lungs: Clear to auscultation, nonlabored breathing at rest. Cardiac: Regular rate and rhythm, no S3, soft systolic murmur, no pericardial rub. Abdomen: Soft, nontender, healed surgical scars noted, bowel sounds present, no guarding or rebound. Extremities: No pitting edema, distal pulses 2+. Skin: Warm and dry. Musculoskeletal: No kyphosis. Neuropsychiatric: Alert and oriented x3, affect grossly appropriate.   ECG: ECG is ordered today.  Recent Labwork: 06/23/2014: ALT 17; AST 20; BUN 15.2; Creatinine 1.0; HGB 11.8*; Platelets 111*; Potassium 4.5; Sodium 142  July 2016: Hemoglobin 11.4, potassium 3.9, BUN 19,  creatinine 0.9, AST 24, ALT 23  Other Studies Reviewed Today:  Echocardiogram 12/15/2013: Study Conclusions  - Left ventricle: The cavity size was mildly dilated. Wall thickness was normal. Systolic function was normal. The estimated ejection fraction was in the range of 50% to 55%. There is hypokinesis of the basalinferolateral myocardium. Doppler parameters are consistent with abnormal left ventricular relaxation (grade 1 diastolic dysfunction). - Mitral valve: Mild prolapse, involving the posterior leaflet. There was mild regurgitation. - Left atrium: The atrium was moderately to severely dilated. - Right ventricle: The cavity size was mildly dilated. - Right atrium: The atrium was mildly dilated.  Impressions:  - Basal inferolateral hypokinesis with low normal LV function; four chamber enlargement; mild prolapse of posterior MV leaflet; mild MR.  ASSESSMENT AND PLAN:  1. CAD status post DES to the LAD in 2010.  He reports fatigue but no active angina symptoms. He has not had ischemic testing in the last 6 years. Plan is to continue medical therapy and follow-up with a Lexiscan Cardiolite to reassess ischemic burden. LVEF was in low normal range as of last July by echocardiogram.  2. Hyperlipidemia, on Pravachol. Follow-up FLP. LFTs normal in July.  3. History of PAF, not documented recently. Dr. Allison Quarry note indicates no significant recurrences, and he has not been anticoagulated long-term. Will continue aspirin for now and reassess if recurrent arrhythmia.  4. Complex hepatic/biliary disease, followed at [facility].  Current medicines were reviewed at length with the patient today.   Orders Placed This Encounter  Procedures  . NM Myocar Multi W/Spect W/Wall Motion / EF  . Cholesterol, Total  . EKG 12-Lead    Disposition: FU with me in 1 year.   Signed, Satira Sark, MD, [facility] 04/13/2015 3:44 PM    [place] at [facility] [address] [place], [place] [address] Phone: [phone]; Fax: [phone]

## 2015-04-13 NOTE — Patient Instructions (Signed)
Medication Instructions:  Your physician recommends that you continue on your current medications as directed. Please refer to the Current Medication list given to you today.   Labwork: Your physician recommends that you return for lab work in: LIPIDS    Testing/Procedures: Your physician has requested that you have a lexiscan myoview. For further information please visit [email or web address]. Please follow instruction sheet, as given.    Follow-Up: Your physician wants you to follow-up in: [place]. You will receive a reminder letter in the mail two months in advance. If you don't receive a letter, please call our office to schedule the follow-up appointment.  Any Other Special Instructions Will Be Listed Below (If Applicable).     If you need a refill on your cardiac medications before your next appointment, please call your pharmacy. Thanks for choosing [facility]!!!

## 2015-04-15 DIAGNOSIS — H353131 Nonexudative age-related macular degeneration, bilateral, early dry stage: Secondary | ICD-10-CM | POA: Diagnosis not present

## 2015-04-15 DIAGNOSIS — Z961 Presence of intraocular lens: Secondary | ICD-10-CM | POA: Diagnosis not present

## 2015-04-19 ENCOUNTER — Other Ambulatory Visit (HOSPITAL_COMMUNITY)
Admission: RE | Admit: 2015-04-19 | Discharge: 2015-04-19 | Disposition: A | Discharge status: Home / Self Care | Payer: Medicare Other | Source: Ambulatory Visit | Attending: Cardiology | Admitting: Cardiology

## 2015-04-19 ENCOUNTER — Encounter (HOSPITAL_COMMUNITY): Payer: Self-pay

## 2015-04-19 ENCOUNTER — Telehealth: Payer: Self-pay

## 2015-04-19 ENCOUNTER — Encounter (HOSPITAL_COMMUNITY)
Admission: RE | Admit: 2015-04-19 | Discharge: 2015-04-19 | Disposition: A | Discharge status: Home / Self Care | Payer: Medicare Other | Source: Ambulatory Visit | Attending: Cardiology | Admitting: Cardiology

## 2015-04-19 ENCOUNTER — Inpatient Hospital Stay (HOSPITAL_COMMUNITY): Admission: RE | Admit: 2015-04-19 | Payer: Medicare Other | Source: Ambulatory Visit

## 2015-04-19 DIAGNOSIS — I2510 Atherosclerotic heart disease of native coronary artery without angina pectoris: Secondary | ICD-10-CM

## 2015-04-19 DIAGNOSIS — Z79899 Other long term (current) drug therapy: Secondary | ICD-10-CM | POA: Insufficient documentation

## 2015-04-19 LAB — NM MYOCAR MULTI W/SPECT W/WALL MOTION / EF
CHL CUP NUCLEAR SDS: 2
CHL CUP NUCLEAR SRS: 5
CHL CUP NUCLEAR SSS: 7
LV sys vol: 82 mL
LVDIAVOL: 131 mL
NUC STRESS TID: 1.09
Peak HR: 85 {beats}/min
RATE: 0.53
Rest HR: 62 {beats}/min

## 2015-04-19 LAB — CHOLESTEROL, TOTAL: CHOLESTEROL: 154 mg/dL (ref 0–200)

## 2015-04-19 MED ORDER — TECHNETIUM TC 99M SESTAMIBI - CARDIOLITE
10.0000 | Freq: Once | INTRAVENOUS | Status: AC | PRN
  Administered 2015-04-19: 08:00:00 10.5 via INTRAVENOUS

## 2015-04-19 MED ORDER — SODIUM CHLORIDE 0.9 % IJ SOLN
INTRAMUSCULAR | Status: AC
  Administered 2015-04-19: 10 mL via INTRAVENOUS
  Filled 2015-04-19: qty 3

## 2015-04-19 MED ORDER — TECHNETIUM TC 99M SESTAMIBI GENERIC - CARDIOLITE
30.0000 | Freq: Once | INTRAVENOUS | Status: AC | PRN
  Administered 2015-04-19: 30.5 via INTRAVENOUS

## 2015-04-19 MED ORDER — REGADENOSON 0.4 MG/5ML IV SOLN
INTRAVENOUS | Status: AC
  Administered 2015-04-19: 0.4 mg via INTRAVENOUS
  Filled 2015-04-19: qty 5

## 2015-04-19 NOTE — Telephone Encounter (Signed)
-----   Message from Satira Sark, MD sent at 04/19/2015  1:03 PM EST ----- Reviewed report. Please let him know that the study did not show any ischemia/decreased blood flow in the LAD distribution which is where his prior stent was placed. There is an area of artifact versus scar in the inferior wall. LVEF calculated in lower range than his prior echocardiogram from July 2015. This may be artifactual as well, would recommend a follow-up echocardiogram to clarify LVEF. At this point anticipate medical therapy unless he has any recurring angina.

## 2015-04-19 NOTE — Telephone Encounter (Signed)
Results given to wife,copy to pcp.Will have front desk staff schedule echo

## 2015-04-29 ENCOUNTER — Telehealth: Payer: Self-pay | Admitting: Cardiology

## 2015-04-29 NOTE — Telephone Encounter (Signed)
Pt has echo scheduled

## 2015-04-29 NOTE — Telephone Encounter (Signed)
Patient stated that when they were given test results last week the nurse said most of the test looked great but there was one thing she wanted to check on and she would have to call them back the next day to confirm if the patient would need additional testing or not.  The patient's wife is due to see Dr. Domenic Polite on Monday, 05/02/15 and they were wondering if Alexander Burton does need additional work done can this be done while they're here for his wife's appointment.  Please contact Alexander Burton at home to confirm if Alexander Burton needs more testing or not.

## 2015-05-04 ENCOUNTER — Ambulatory Visit (HOSPITAL_COMMUNITY)
Admission: RE | Admit: 2015-05-04 | Discharge: 2015-05-04 | Disposition: A | Discharge status: Home / Self Care | Payer: Medicare Other | Source: Ambulatory Visit | Attending: Cardiology | Admitting: Cardiology

## 2015-05-04 DIAGNOSIS — E785 Hyperlipidemia, unspecified: Secondary | ICD-10-CM | POA: Diagnosis not present

## 2015-05-04 DIAGNOSIS — I2510 Atherosclerotic heart disease of native coronary artery without angina pectoris: Secondary | ICD-10-CM | POA: Diagnosis not present

## 2015-05-04 DIAGNOSIS — I10 Essential (primary) hypertension: Secondary | ICD-10-CM | POA: Diagnosis not present

## 2015-05-04 DIAGNOSIS — R079 Chest pain, unspecified: Secondary | ICD-10-CM | POA: Insufficient documentation

## 2015-05-09 DIAGNOSIS — N401 Enlarged prostate with lower urinary tract symptoms: Secondary | ICD-10-CM | POA: Diagnosis not present

## 2015-05-09 DIAGNOSIS — R972 Elevated prostate specific antigen [PSA]: Secondary | ICD-10-CM | POA: Diagnosis not present

## 2015-05-09 DIAGNOSIS — R351 Nocturia: Secondary | ICD-10-CM | POA: Diagnosis not present

## 2015-05-13 DIAGNOSIS — Z9049 Acquired absence of other specified parts of digestive tract: Secondary | ICD-10-CM | POA: Diagnosis not present

## 2015-05-13 DIAGNOSIS — K830 Cholangitis: Secondary | ICD-10-CM | POA: Diagnosis not present

## 2015-06-30 DIAGNOSIS — Z23 Encounter for immunization: Secondary | ICD-10-CM | POA: Diagnosis not present

## 2015-06-30 DIAGNOSIS — Z85828 Personal history of other malignant neoplasm of skin: Secondary | ICD-10-CM | POA: Diagnosis not present

## 2015-06-30 DIAGNOSIS — D225 Melanocytic nevi of trunk: Secondary | ICD-10-CM | POA: Diagnosis not present

## 2015-06-30 DIAGNOSIS — Z87898 Personal history of other specified conditions: Secondary | ICD-10-CM | POA: Diagnosis not present

## 2015-06-30 DIAGNOSIS — L821 Other seborrheic keratosis: Secondary | ICD-10-CM | POA: Diagnosis not present

## 2015-06-30 DIAGNOSIS — D485 Neoplasm of uncertain behavior of skin: Secondary | ICD-10-CM | POA: Diagnosis not present

## 2015-06-30 DIAGNOSIS — Z86018 Personal history of other benign neoplasm: Secondary | ICD-10-CM | POA: Diagnosis not present

## 2015-06-30 DIAGNOSIS — L570 Actinic keratosis: Secondary | ICD-10-CM | POA: Diagnosis not present

## 2015-06-30 DIAGNOSIS — L820 Inflamed seborrheic keratosis: Secondary | ICD-10-CM | POA: Diagnosis not present

## 2015-07-12 ENCOUNTER — Encounter (INDEPENDENT_AMBULATORY_CARE_PROVIDER_SITE_OTHER): Payer: Self-pay | Admitting: Internal Medicine

## 2015-07-12 ENCOUNTER — Ambulatory Visit (INDEPENDENT_AMBULATORY_CARE_PROVIDER_SITE_OTHER): Payer: Medicare Other | Admitting: Internal Medicine

## 2015-07-12 VITALS — BP 100/70 | HR 66 | Temp 97.5°F | Resp 18 | Ht 71.0 in | Wt 192.9 lb

## 2015-07-12 DIAGNOSIS — K219 Gastro-esophageal reflux disease without esophagitis: Secondary | ICD-10-CM | POA: Diagnosis not present

## 2015-07-12 DIAGNOSIS — K831 Obstruction of bile duct: Secondary | ICD-10-CM

## 2015-07-12 DIAGNOSIS — K9189 Other postprocedural complications and disorders of digestive system: Principal | ICD-10-CM

## 2015-07-12 DIAGNOSIS — K222 Esophageal obstruction: Secondary | ICD-10-CM | POA: Diagnosis not present

## 2015-07-12 LAB — HEPATIC FUNCTION PANEL
ALBUMIN: 3.5 g/dL — AB (ref 3.6–5.1)
ALT: 10 U/L (ref 9–46)
AST: 17 U/L (ref 10–35)
Alkaline Phosphatase: 101 U/L (ref 40–115)
Bilirubin, Direct: 0.3 mg/dL — ABNORMAL HIGH (ref <=0.2)
Indirect Bilirubin: 1 mg/dL (ref 0.2–1.2)
Total Bilirubin: 1.3 mg/dL — ABNORMAL HIGH (ref 0.2–1.2)
Total Protein: 6 g/dL — ABNORMAL LOW (ref 6.1–8.1)

## 2015-07-12 LAB — CBC
HEMATOCRIT: 38.5 % — AB (ref 39.0–52.0)
HEMOGLOBIN: 12.6 g/dL — AB (ref 13.0–17.0)
MCH: 27 pg (ref 26.0–34.0)
MCHC: 32.7 g/dL (ref 30.0–36.0)
MCV: 82.4 fL (ref 78.0–100.0)
MPV: 11 fL (ref 8.6–12.4)
Platelets: 105 10*3/uL — ABNORMAL LOW (ref 150–400)
RBC: 4.67 MIL/uL (ref 4.22–5.81)
RDW: 16.6 % — AB (ref 11.5–15.5)
WBC: 6.7 10*3/uL (ref 4.0–10.5)

## 2015-07-12 NOTE — Patient Instructions (Signed)
Physician will call with results of blood tests when completed. 

## 2015-07-12 NOTE — Progress Notes (Signed)
Presenting complaint;  Follow-up for GERD dysphagia and history of biliary stricture.  Subjective:  Patient is 75 year old Caucasian male who is here for scheduled visit. He was last seen 6 months ago. He has history of hepaticojejunostomy stricture diagnosed in August 2013 when he presented with cholangitis. He developed bile duct injury in 1993 at the time of laparoscopic cholecystectomy leading to hepaticojejunostomy at [facility]. He has required periodic external drainage and dilation of this stricture. Last dilation was in July 2016 and stent was removed in September 2016. Patient stated he had temp of 101 about 3 weeks ago and he took Cipro for 5 days with resolution of his symptoms. He did not experience abdominal pain. He believes he had water illness. 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 His wife is concerned about his weight loss however he has gained 9 pounds in the last 6 months. Patient states his appetite is fair. He does not do regular exercise. 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 He states heartburn is well controlled. He denies dysphagia. He was recently diagnosed with macular degeneration. Bowels move daily. He denies melena or rectal bleeding.    Current Medications: Outpatient Encounter Prescriptions as of 07/12/2015  Medication Sig  . aspirin EC 81 MG tablet Take 81 mg by mouth every evening.  . cycloSPORINE (RESTASIS) 0.05 % ophthalmic emulsion Place 1 drop into both eyes 2 (two) times daily.   Marland Kitchen docusate sodium (COLACE) 100 MG capsule Take 2 capsules (200 mg total) by mouth daily. (Patient taking differently: Take 200 mg by mouth daily as needed. )  . esomeprazole (NEXIUM) 40 MG capsule Take 40 mg by mouth daily before breakfast.    . finasteride (PROSCAR) 5 MG tablet Take 5 mg by mouth every morning.   . metoprolol tartrate (LOPRESSOR) 25 MG tablet Take 0.5 tablets (12.5 mg total) by mouth  2 (two) times daily. <PLEASE MAKE APPOINTMENT FOR REFILLS>  . Multiple Vitamin (MULITIVITAMIN WITH MINERALS) TABS Take 1 tablet by mouth every morning.   . Multiple Vitamins-Minerals (PRESERVISION AREDS) CAPS Take by mouth daily.  . pravastatin (PRAVACHOL) 20 MG tablet Take 1 tablet (20 mg total) by mouth daily.  . Probiotic Product (PROBIOTIC DAILY PO) Take 1 tablet by mouth daily.  . tamsulosin (FLOMAX) 0.4 MG CAPS capsule Take 0.4 mg by mouth daily after supper.   . ursodiol (ACTIGALL) 300 MG capsule Take 1 capsule (300 mg total) by mouth 2 (two) times daily.   No facility-administered encounter medications on file as of 07/12/2015.     Objective: Blood pressure 100/70, pulse 66, temperature 97.5 F (36.4 C), temperature source Oral, resp. rate 18, height 5\' 11"  (1.803 m), weight 192 lb 14.4 oz (87.499 kg). Patient is alert and in no acute distress. Conjunctiva is pink. Sclera is nonicteric Oropharyngeal mucosa is normal. No neck masses or thyromegaly noted. Cardiac exam with regular rhythm normal S1 and S2. No murmur or gallop noted. Lungs are clear to auscultation. Abdomen. Scar noted in right upper quadrant. He has right inguinal hernia which is completely reducible. Abdomen is soft and nontender without organomegaly or masses. No LE edema or clubbing noted.  Labs/studies Results: Lab data from 05/13/2015  WBC 6.0, H&H 12.8 and 39.6 and platelet count 124K  Bilirubin 1, AP 96, AST 21, ALT 12, total protein 3.3   Assessment:  #1. History of hepaticojejunostomy/anastomotic stricture resulting from remote bile duct injury  over 20 years ago. He has required multiple dilations via percutaneous route most recently in July last year and the stent was removed in September 2016. Febrile illness 3 weeks ago responding to 5 days of Cipro. He is asymptomatic today. Will check LFTs to make sure he does not have cholestasis. #2. GERD. Symptoms well controlled with therapy. #3. History of  benign esophageal stricture. Status post dilation 6 months ago with complete relief of dysphagia. #4. History of anemia and thrombocytopenia.  Plan:  Patient will go the lab for CBC and LFTs. Patient will call if solid food dysphagia recurs or if he has fever and/or abdominal pain. Office visit in one year or earlier if necessary.

## 2015-09-27 ENCOUNTER — Other Ambulatory Visit (INDEPENDENT_AMBULATORY_CARE_PROVIDER_SITE_OTHER): Payer: Self-pay | Admitting: Internal Medicine

## 2015-11-18 DIAGNOSIS — N401 Enlarged prostate with lower urinary tract symptoms: Secondary | ICD-10-CM | POA: Diagnosis not present

## 2015-11-18 DIAGNOSIS — R972 Elevated prostate specific antigen [PSA]: Secondary | ICD-10-CM | POA: Diagnosis not present

## 2015-11-18 DIAGNOSIS — R351 Nocturia: Secondary | ICD-10-CM | POA: Diagnosis not present

## 2015-11-18 DIAGNOSIS — N529 Male erectile dysfunction, unspecified: Secondary | ICD-10-CM | POA: Diagnosis not present

## 2015-11-22 DIAGNOSIS — K830 Cholangitis: Secondary | ICD-10-CM | POA: Diagnosis not present

## 2015-11-22 DIAGNOSIS — Z9049 Acquired absence of other specified parts of digestive tract: Secondary | ICD-10-CM | POA: Diagnosis not present

## 2015-11-22 DIAGNOSIS — R509 Fever, unspecified: Secondary | ICD-10-CM | POA: Diagnosis not present

## 2015-11-22 DIAGNOSIS — R61 Generalized hyperhidrosis: Secondary | ICD-10-CM | POA: Diagnosis not present

## 2015-11-22 DIAGNOSIS — R5382 Chronic fatigue, unspecified: Secondary | ICD-10-CM | POA: Diagnosis not present

## 2015-11-22 DIAGNOSIS — I10 Essential (primary) hypertension: Secondary | ICD-10-CM | POA: Diagnosis not present

## 2015-11-22 DIAGNOSIS — K831 Obstruction of bile duct: Secondary | ICD-10-CM | POA: Diagnosis not present

## 2015-11-22 DIAGNOSIS — R110 Nausea: Secondary | ICD-10-CM | POA: Diagnosis not present

## 2015-11-22 DIAGNOSIS — R935 Abnormal findings on diagnostic imaging of other abdominal regions, including retroperitoneum: Secondary | ICD-10-CM | POA: Diagnosis not present

## 2015-12-01 ENCOUNTER — Telehealth (INDEPENDENT_AMBULATORY_CARE_PROVIDER_SITE_OTHER): Payer: Self-pay | Admitting: Internal Medicine

## 2015-12-01 NOTE — Telephone Encounter (Signed)
Patient's spouse, [name] called this morning.  She would like to speak to Dr. [name].  She wants Dr. [name] to know that all his doctors at [facility] are on vacation and she wants him to know what she has done for her husband.  She stated that he's been sick with wet clammy spells.  She is concerned that it's his liver again.  She stated that the highest his temp has gotten is 99.5 and he threw up from 1am - 10:30am.  She has put him back on his Cipro.  [phone]

## 2015-12-02 NOTE — Telephone Encounter (Signed)
Per Dr.Rehman the patient states that the patient should be on antibiotics. Call in [place] for 2 weeks treatment. If the patient develops a fever greater than 101 he is to go to the Ed.  A message was left on the patient's voicemail.  Rx was called to [facility]

## 2015-12-14 DIAGNOSIS — K831 Obstruction of bile duct: Secondary | ICD-10-CM | POA: Diagnosis not present

## 2015-12-14 DIAGNOSIS — R5382 Chronic fatigue, unspecified: Secondary | ICD-10-CM | POA: Diagnosis not present

## 2015-12-14 DIAGNOSIS — R110 Nausea: Secondary | ICD-10-CM | POA: Diagnosis not present

## 2015-12-14 DIAGNOSIS — K9189 Other postprocedural complications and disorders of digestive system: Secondary | ICD-10-CM | POA: Diagnosis not present

## 2015-12-14 DIAGNOSIS — R61 Generalized hyperhidrosis: Secondary | ICD-10-CM | POA: Diagnosis not present

## 2015-12-14 DIAGNOSIS — I10 Essential (primary) hypertension: Secondary | ICD-10-CM | POA: Diagnosis not present

## 2015-12-14 DIAGNOSIS — R935 Abnormal findings on diagnostic imaging of other abdominal regions, including retroperitoneum: Secondary | ICD-10-CM | POA: Diagnosis not present

## 2015-12-14 DIAGNOSIS — K830 Cholangitis: Secondary | ICD-10-CM | POA: Diagnosis not present

## 2015-12-14 DIAGNOSIS — R509 Fever, unspecified: Secondary | ICD-10-CM | POA: Diagnosis not present

## 2015-12-14 DIAGNOSIS — Z9049 Acquired absence of other specified parts of digestive tract: Secondary | ICD-10-CM | POA: Diagnosis not present

## 2015-12-28 DIAGNOSIS — E782 Mixed hyperlipidemia: Secondary | ICD-10-CM | POA: Diagnosis not present

## 2015-12-30 DIAGNOSIS — D696 Thrombocytopenia, unspecified: Secondary | ICD-10-CM | POA: Diagnosis not present

## 2015-12-30 DIAGNOSIS — E782 Mixed hyperlipidemia: Secondary | ICD-10-CM | POA: Diagnosis not present

## 2015-12-30 DIAGNOSIS — K219 Gastro-esophageal reflux disease without esophagitis: Secondary | ICD-10-CM | POA: Diagnosis not present

## 2015-12-30 DIAGNOSIS — K831 Obstruction of bile duct: Secondary | ICD-10-CM | POA: Diagnosis not present

## 2016-01-09 DIAGNOSIS — Z85820 Personal history of malignant melanoma of skin: Secondary | ICD-10-CM | POA: Diagnosis not present

## 2016-01-09 DIAGNOSIS — E785 Hyperlipidemia, unspecified: Secondary | ICD-10-CM | POA: Diagnosis not present

## 2016-01-09 DIAGNOSIS — I10 Essential (primary) hypertension: Secondary | ICD-10-CM | POA: Diagnosis not present

## 2016-01-09 DIAGNOSIS — Z87891 Personal history of nicotine dependence: Secondary | ICD-10-CM | POA: Diagnosis not present

## 2016-01-09 DIAGNOSIS — Z955 Presence of coronary angioplasty implant and graft: Secondary | ICD-10-CM | POA: Diagnosis not present

## 2016-01-09 DIAGNOSIS — Z9049 Acquired absence of other specified parts of digestive tract: Secondary | ICD-10-CM | POA: Diagnosis not present

## 2016-01-09 DIAGNOSIS — I2510 Atherosclerotic heart disease of native coronary artery without angina pectoris: Secondary | ICD-10-CM | POA: Diagnosis not present

## 2016-01-09 DIAGNOSIS — E669 Obesity, unspecified: Secondary | ICD-10-CM | POA: Diagnosis not present

## 2016-01-09 DIAGNOSIS — K831 Obstruction of bile duct: Secondary | ICD-10-CM | POA: Diagnosis not present

## 2016-01-09 DIAGNOSIS — I739 Peripheral vascular disease, unspecified: Secondary | ICD-10-CM | POA: Diagnosis not present

## 2016-01-09 DIAGNOSIS — G4730 Sleep apnea, unspecified: Secondary | ICD-10-CM | POA: Diagnosis not present

## 2016-01-09 DIAGNOSIS — Z7982 Long term (current) use of aspirin: Secondary | ICD-10-CM | POA: Diagnosis not present

## 2016-01-09 DIAGNOSIS — K830 Cholangitis: Secondary | ICD-10-CM | POA: Diagnosis not present

## 2016-01-10 DIAGNOSIS — E785 Hyperlipidemia, unspecified: Secondary | ICD-10-CM | POA: Diagnosis not present

## 2016-01-10 DIAGNOSIS — I10 Essential (primary) hypertension: Secondary | ICD-10-CM | POA: Diagnosis not present

## 2016-01-10 DIAGNOSIS — E669 Obesity, unspecified: Secondary | ICD-10-CM | POA: Diagnosis not present

## 2016-01-10 DIAGNOSIS — G4730 Sleep apnea, unspecified: Secondary | ICD-10-CM | POA: Diagnosis not present

## 2016-01-10 DIAGNOSIS — K830 Cholangitis: Secondary | ICD-10-CM | POA: Diagnosis not present

## 2016-01-10 DIAGNOSIS — K831 Obstruction of bile duct: Secondary | ICD-10-CM | POA: Diagnosis not present

## 2016-01-23 ENCOUNTER — Other Ambulatory Visit: Payer: Self-pay

## 2016-02-03 DIAGNOSIS — K831 Obstruction of bile duct: Secondary | ICD-10-CM | POA: Diagnosis not present

## 2016-02-03 DIAGNOSIS — K830 Cholangitis: Secondary | ICD-10-CM | POA: Diagnosis not present

## 2016-02-03 DIAGNOSIS — Z9049 Acquired absence of other specified parts of digestive tract: Secondary | ICD-10-CM | POA: Diagnosis not present

## 2016-02-14 DIAGNOSIS — Z23 Encounter for immunization: Secondary | ICD-10-CM | POA: Diagnosis not present

## 2016-02-21 ENCOUNTER — Other Ambulatory Visit: Payer: Self-pay | Admitting: Cardiology

## 2016-03-20 DIAGNOSIS — K831 Obstruction of bile duct: Secondary | ICD-10-CM | POA: Diagnosis not present

## 2016-04-03 DIAGNOSIS — Z4689 Encounter for fitting and adjustment of other specified devices: Secondary | ICD-10-CM | POA: Diagnosis not present

## 2016-04-03 DIAGNOSIS — K831 Obstruction of bile duct: Secondary | ICD-10-CM | POA: Diagnosis not present

## 2016-04-05 DIAGNOSIS — Z961 Presence of intraocular lens: Secondary | ICD-10-CM | POA: Diagnosis not present

## 2016-04-05 DIAGNOSIS — H353132 Nonexudative age-related macular degeneration, bilateral, intermediate dry stage: Secondary | ICD-10-CM | POA: Diagnosis not present

## 2016-04-05 DIAGNOSIS — H26492 Other secondary cataract, left eye: Secondary | ICD-10-CM | POA: Diagnosis not present

## 2016-04-16 DIAGNOSIS — H43822 Vitreomacular adhesion, left eye: Secondary | ICD-10-CM | POA: Diagnosis not present

## 2016-04-16 DIAGNOSIS — H35361 Drusen (degenerative) of macula, right eye: Secondary | ICD-10-CM | POA: Diagnosis not present

## 2016-04-16 DIAGNOSIS — H353132 Nonexudative age-related macular degeneration, bilateral, intermediate dry stage: Secondary | ICD-10-CM | POA: Diagnosis not present

## 2016-04-16 DIAGNOSIS — H43821 Vitreomacular adhesion, right eye: Secondary | ICD-10-CM | POA: Diagnosis not present

## 2016-04-18 NOTE — Progress Notes (Signed)
Cardiology Office Note  Date: 04/24/2016   ID: Alexander Burton [birth year], MRN [id number]  PCP: Wende Neighbors, MD  Primary Cardiologist: Rozann Lesches, MD   Chief Complaint  Patient presents with  . Coronary Artery Disease    History of Present Illness: Alexander Burton is a 75 y.o. male last seen in November 2016. He is here today for a follow-up visit with his daughter. Reports no significant change from a cardiac perspective, specifically no angina symptoms or nitroglycerin use. 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  Follow-up cardiac testing from 2016 is outlined below including Myoview and echocardiogram.  I reviewed his medications which are outlined below. Cardiac regimen includes aspirin, Lopressor, and Pravachol. He has not had a follow-up lipid panel as yet.  I reviewed his ECG today which shows sinus rhythm with nonspecific T-wave abnormalities.  Past Medical History:  Diagnosis Date  . BPH (benign prostatic hyperplasia)   . CAD (coronary artery disease)    DES to LAD 06/2008  . Colon polyps   . COPD (chronic obstructive pulmonary disease) ([place])   . Gastritis   . Goiter   . 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 History of syncope January 2010   PVC's and14 beats NSVT as well as atrial afib  . Hyperlipidemia   . Melanoma ([place])   . Osteoarthritis   . PAF (paroxysmal atrial fibrillation) ([place])   . Prostate CA ([place])   . Sleep apnea 04/02/2005    Past Surgical History:  Procedure Laterality Date  . CARDIAC CATHETERIZATION  Feb 2010   post DES to LAD ,3.5 X 33 mm Cypher stent,dilated up to 3.8 mm 80 to90% stenosis btwn first and second diag branc w.moderate disease EF 50%  . CHOLECYSTECTOMY     Complicated by rupture of common bile duct and other vascular structures. Has now had multiple issues with chronic hepatic abscess. Currently has indwelling drain  . COLONOSCOPY  08/16/2011   Procedure:  COLONOSCOPY;  Surgeon: Rogene Houston, MD;  [facility];  Service: Endoscopy;  Laterality: N/A;  1200  . ESOPHAGEAL DILATION N/A 01/20/2015   Procedure: ESOPHAGEAL DILATION;  Surgeon: Rogene Houston, MD;  [facility];  Service: Endoscopy;  Laterality: N/A;  . ESOPHAGOGASTRODUODENOSCOPY N/A 01/20/2015   Procedure: ESOPHAGOGASTRODUODENOSCOPY (EGD);  Surgeon: Rogene Houston, MD;  [facility];  Service: Endoscopy;  Laterality: N/A;  1200  . LEA DOPPLER  07/28/2008   NORMAL RGT GROIN DUPLEX DOPPLER  . liver stent    . LIVER SURGERY  09-03/14   @ [facility]  . Melanoma resection      Current Outpatient Prescriptions  Medication Sig Dispense Refill  . aspirin EC 81 MG tablet Take 81 mg by mouth every evening.    . cycloSPORINE (RESTASIS) 0.05 % ophthalmic emulsion Place 1 drop into both eyes 2 (two) times daily.     Marland Kitchen docusate sodium (COLACE) 100 MG capsule Take 2 capsules (200 mg total) by mouth daily. (Patient taking differently: Take 200 mg by mouth daily as needed. ) 10 capsule 0  . esomeprazole (NEXIUM) 40 MG capsule Take 40 mg by mouth daily before breakfast.      . finasteride (PROSCAR) 5 MG tablet Take 5 mg by mouth every morning.     . metoprolol tartrate (LOPRESSOR) 25 MG tablet TAKE ONE-HALF TABLET BY MOUTH TWICE DAILY 90 tablet 1  . Multiple Vitamin (  MULITIVITAMIN WITH MINERALS) TABS Take 1 tablet by mouth every morning.     . Multiple Vitamins-Minerals (PRESERVISION AREDS) CAPS Take by mouth daily.    . pravastatin (PRAVACHOL) 20 MG tablet Take 1 tablet (20 mg total) by mouth daily. 90 tablet 3  . Probiotic Product (PROBIOTIC DAILY PO) Take 1 tablet by mouth daily.    . tamsulosin (FLOMAX) 0.4 MG CAPS capsule Take 0.4 mg by mouth daily after supper.   3  . ursodiol (ACTIGALL) 300 MG capsule TAKE ONE CAPSULE BY MOUTH TWICE DAILY. 180 capsule 11   No current facility-administered medications for this visit.    Allergies:  Contrast media [iodinated  diagnostic agents] and Other   Social History: The patient  reports that he quit smoking about 8 years ago. His smoking use included Cigarettes. He has a 75.00 pack-year smoking history. He has never used smokeless tobacco. He reports that he drinks about 0.6 oz of alcohol per week . He reports that he does not use drugs.   Family History: The patient's family history includes Colon cancer in his sister.   ROS:  Please see the history of present illness. Otherwise, complete review of systems is positive for none.  All other systems are reviewed and negative.   Physical Exam: VS:  BP 130/70   Pulse 60   Ht 5\' 11"  (1.803 m)   Wt 191 lb (86.6 kg)   SpO2 93%   BMI 26.64 kg/m , BMI Body mass index is 26.64 kg/m.  Wt Readings from Last 3 Encounters:  04/24/16 191 lb (86.6 kg)  07/12/15 192 lb 14.4 oz (87.5 kg)  04/13/15 189 lb (85.7 kg)    General: Appears comfortable at rest. HEENT: Conjunctiva and lids normal, oropharynx clear. Neck: Supple, no elevated JVP or carotid bruits, no thyromegaly. Lungs: Clear to auscultation, nonlabored breathing at rest. Cardiac: Regular rate and rhythm, no S3, soft systolic murmur, no pericardial rub. Abdomen: Soft, nontender, healed surgical scars noted, bowel sounds present, no guarding or rebound. Extremities: No pitting edema, distal pulses 2+. Skin: Warm and dry. Musculoskeletal: No kyphosis. Neuropsychiatric: Alert and oriented x3, affect grossly appropriate.  ECG: I personally reviewed the tracing from 04/13/2015 which showed sinus rhythm with PVCs and IVCD.  Recent Labwork: 07/12/2015: ALT 10; AST 17; Hemoglobin 12.6; Platelets 105     Component Value Date/Time   CHOL 154 04/19/2015 0951    Other Studies Reviewed Today:  Echocardiogram 05/04/2015: Study Conclusions  - Left ventricle: The cavity size was normal. Wall thickness was   increased in a pattern of mild LVH. Systolic function was normal.   The estimated ejection fraction was  in the range of 60% to 65%.   Doppler parameters are consistent with abnormal left ventricular   relaxation (grade 1 diastolic dysfunction). - Regional wall motion abnormality: Moderate hypokinesis of the mid   inferior and mid inferolateral myocardium; mild hypokinesis of   the basal inferolateral myocardium. - Aortic valve: Mildly to moderately calcified annulus. Trileaflet. - Mitral valve: Very mild posterior leaflet prolapse. There was   mild regurgitation. - Left atrium: The atrium was moderately to severely dilated. - Right ventricle: The cavity size was mildly dilated. - Right atrium: The atrium was mildly to moderately dilated.  Lexiscan Myoview 04/19/2015:  There was no ST segment deviation noted during stress.  The left ventricular ejection fraction is moderately decreased (30-44%).  There is a medium sized moderate intensity fixed basal inferolateral defect seen in both the resting and post-injection  images. There is severe radiotracer uptake in the adjacent gut which may create this artifact, however cannot rule moderate prior infarct. There is no current ischemia.  This is an intermediate risk study based on low ejection fraction. There is no significant myocardium currently at jeopardy.  Assessment and Plan:  1. Symptomatically stable CAD status post DES to the LAD in 2010. Ischemic testing from last year revealed scar in the inferolateral wall but no active ischemia. LVEF by echocardiogram at that time was 60-65%. Plan to continue medical therapy and observation.  2. Hyperlipidemia, on Pravachol. I encouraged him to follow with Dr. Nevada Crane for repeat testing.  3. Remote history of PAF with no obvious recurrences. Plan is to continue observation on aspirin, can reassess for anticoagulation if needed.  4. Complex hepatic/biliary disease followed at [facility]. States that he underwent interval biliary stent placement since I saw him.  Current medicines were reviewed with the  patient today.   Orders Placed This Encounter  Procedures  . EKG 12-Lead    Disposition: Follow-up in one year, sooner if needed.  Signed, Satira Sark, MD, [facility] 04/24/2016 10:01 AM    [place] at [facility] [address] [place], [place] [address] Phone: [phone]; Fax: [phone]

## 2016-04-24 ENCOUNTER — Encounter: Payer: Self-pay | Admitting: Cardiology

## 2016-04-24 ENCOUNTER — Ambulatory Visit (INDEPENDENT_AMBULATORY_CARE_PROVIDER_SITE_OTHER): Payer: Medicare Other | Admitting: Cardiology

## 2016-04-24 VITALS — BP 130/70 | HR 60 | Ht 71.0 in | Wt 191.0 lb

## 2016-04-24 DIAGNOSIS — I2510 Atherosclerotic heart disease of native coronary artery without angina pectoris: Secondary | ICD-10-CM | POA: Diagnosis not present

## 2016-04-24 DIAGNOSIS — I480 Paroxysmal atrial fibrillation: Secondary | ICD-10-CM | POA: Diagnosis not present

## 2016-04-24 DIAGNOSIS — E782 Mixed hyperlipidemia: Secondary | ICD-10-CM

## 2016-04-24 NOTE — Patient Instructions (Signed)

## 2016-04-28 ENCOUNTER — Other Ambulatory Visit: Payer: Self-pay | Admitting: Cardiology

## 2016-04-28 ENCOUNTER — Emergency Department (HOSPITAL_COMMUNITY)
Admission: EM | Admit: 2016-04-28 | Discharge: 2016-04-28 | Disposition: A | Discharge status: Home / Self Care | Payer: Medicare Other | Attending: Emergency Medicine | Admitting: Emergency Medicine

## 2016-04-28 ENCOUNTER — Encounter (HOSPITAL_COMMUNITY): Payer: Self-pay | Admitting: Emergency Medicine

## 2016-04-28 DIAGNOSIS — Z8546 Personal history of malignant neoplasm of prostate: Secondary | ICD-10-CM | POA: Insufficient documentation

## 2016-04-28 DIAGNOSIS — I10 Essential (primary) hypertension: Secondary | ICD-10-CM | POA: Insufficient documentation

## 2016-04-28 DIAGNOSIS — J449 Chronic obstructive pulmonary disease, unspecified: Secondary | ICD-10-CM | POA: Diagnosis not present

## 2016-04-28 DIAGNOSIS — Z7982 Long term (current) use of aspirin: Secondary | ICD-10-CM | POA: Insufficient documentation

## 2016-04-28 DIAGNOSIS — R220 Localized swelling, mass and lump, head: Secondary | ICD-10-CM | POA: Diagnosis present

## 2016-04-28 DIAGNOSIS — Z87891 Personal history of nicotine dependence: Secondary | ICD-10-CM | POA: Insufficient documentation

## 2016-04-28 DIAGNOSIS — I2510 Atherosclerotic heart disease of native coronary artery without angina pectoris: Secondary | ICD-10-CM | POA: Diagnosis not present

## 2016-04-28 DIAGNOSIS — T783XXA Angioneurotic edema, initial encounter: Secondary | ICD-10-CM | POA: Diagnosis not present

## 2016-04-28 DIAGNOSIS — Z79899 Other long term (current) drug therapy: Secondary | ICD-10-CM | POA: Diagnosis not present

## 2016-04-28 MED ORDER — PREDNISONE 10 MG PO TABS
60.0000 mg | ORAL_TABLET | Freq: Once | ORAL | Status: AC
  Administered 2016-04-28: 60 mg via ORAL
  Filled 2016-04-28: qty 1

## 2016-04-28 MED ORDER — PREDNISONE 20 MG PO TABS: 40.0000 mg | ORAL_TABLET | Freq: Every day | ORAL | 0 refills | Status: DC

## 2016-04-28 NOTE — ED Provider Notes (Signed)
[facility] DEPT Provider Note   CSN: [id number] Arrival date & time: 04/28/16  1124  By signing my name below, I, Dolores Hoose, attest that this documentation has been prepared under the direction and in the presence of Davonna Belling, MD . Electronically Signed: Dolores Hoose, Scribe. 04/28/2016. 12:04 PM.   History   Chief Complaint Chief Complaint  Patient presents with  . Oral Swelling   HPI  HPI Comments:  Alexander Burton is a 75 y.o. male with pmhx of HLD and prostate ca who presents to the Emergency Department complaining of sudden-onset constant gradually worsening oral swelling beginning a few days ago. Pt describes his symptoms as being worse toward the front of his mouth and lips, and that the swelling has not moved to his throat. No modifying factors. Pt reports associated itching and rash to bilateral buttocks. He works as a Psychologist, sport and exercise and is frequently outside but otherwise denies any new medications or foods.   Past Medical History:  Diagnosis Date  . BPH (benign prostatic hyperplasia)   . CAD (coronary artery disease)    DES to LAD 06/2008  . Colon polyps   . COPD (chronic obstructive pulmonary disease) ([place])   . Gastritis   . Goiter   . 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 History of syncope January 2010   PVC's and14 beats NSVT as well as atrial afib  . Hyperlipidemia   . Melanoma ([place])   . Osteoarthritis   . PAF (paroxysmal atrial fibrillation) ([place])   . Prostate CA ([place])   . Sleep apnea 04/02/2005    Patient Active Problem List   Diagnosis Date Noted  . Sepsis ([place]) 03/26/2014  . Thrombocytopenia ([place]) 03/24/2014  . Anemia of chronic disease 03/24/2014  . Fever 03/23/2014  . Bacteremia - enterococcal 12/09/2013  . Hyperlipidemia with target LDL less than 70   . CAD (coronary artery disease) 12/07/2012  . Presence of drug coated stent in LAD coronary artery   . PAF (paroxysmal atrial fibrillation) ([place])   . Pre-op  evaluation 11/25/2012  . Cholangitis, recurrent 01/17/2012  . Weight loss, unintentional 12/25/2011  . Diarrhea 12/05/2011  . Elevated liver enzymes 11/14/2011  . Night sweats 11/07/2011  . CAD S/P percutaneous coronary angioplasty 04/03/2011  . Prostate enlargement 04/03/2011  . Bony sclerosis 04/03/2011  . Sleep apnea 04/03/2011  . Essential hypertension   . Liver abscess 03/27/2011    Past Surgical History:  Procedure Laterality Date  . CARDIAC CATHETERIZATION  Feb 2010   post DES to LAD ,3.5 X 33 mm Cypher stent,dilated up to 3.8 mm 80 to90% stenosis btwn first and second diag branc w.moderate disease EF 50%  . CHOLECYSTECTOMY     Complicated by rupture of common bile duct and other vascular structures. Has now had multiple issues with chronic hepatic abscess. Currently has indwelling drain  . COLONOSCOPY  08/16/2011   Procedure: COLONOSCOPY;  Surgeon: Rogene Houston, MD;  [facility];  Service: Endoscopy;  Laterality: N/A;  1200  . ESOPHAGEAL DILATION N/A 01/20/2015   Procedure: ESOPHAGEAL DILATION;  Surgeon: Rogene Houston, MD;  Location: [facility];  Service: Endoscopy;  Laterality: N/A;  . ESOPHAGOGASTRODUODENOSCOPY N/A 01/20/2015   Procedure: ESOPHAGOGASTRODUODENOSCOPY (EGD);  Surgeon: Rogene Houston, MD;  Location: [facility];  Service: Endoscopy;  Laterality: N/A;  1200  . LEA DOPPLER  07/28/2008   NORMAL RGT GROIN DUPLEX DOPPLER  . liver stent    . LIVER  SURGERY  09-03/14   @ [facility]  . Melanoma resection         Home Medications    Prior to Admission medications   Medication Sig Start Date End Date Taking? Authorizing Provider  aspirin EC 81 MG tablet Take 81 mg by mouth every evening.    Historical Provider, MD  cycloSPORINE (RESTASIS) 0.05 % ophthalmic emulsion Place 1 drop into both eyes 2 (two) times daily.     Historical Provider, MD  docusate sodium (COLACE) 100 MG capsule Take 2 capsules (200 mg total) by mouth daily. Patient taking  differently: Take 200 mg by mouth daily as needed.  01/10/15   Rogene Houston, MD  esomeprazole (NEXIUM) 40 MG capsule Take 40 mg by mouth daily before breakfast.      Historical Provider, MD  finasteride (PROSCAR) 5 MG tablet Take 5 mg by mouth every morning.     Historical Provider, MD  metoprolol tartrate (LOPRESSOR) 25 MG tablet TAKE ONE-HALF TABLET BY MOUTH TWICE DAILY 02/21/16   Satira Sark, MD  Multiple Vitamin (MULITIVITAMIN WITH MINERALS) TABS Take 1 tablet by mouth every morning.     Historical Provider, MD  Multiple Vitamins-Minerals (PRESERVISION AREDS) CAPS Take by mouth daily.    Historical Provider, MD  pravastatin (PRAVACHOL) 20 MG tablet Take 1 tablet (20 mg total) by mouth daily. 04/13/15   Satira Sark, MD  predniSONE (DELTASONE) 20 MG tablet Take 2 tablets (40 mg total) by mouth daily. 04/29/16   Davonna Belling, MD  Probiotic Product (PROBIOTIC DAILY PO) Take 1 tablet by mouth daily.    Historical Provider, MD  tamsulosin (FLOMAX) 0.4 MG CAPS capsule Take 0.4 mg by mouth daily after supper.  06/24/15   Historical Provider, MD  ursodiol (ACTIGALL) 300 MG capsule TAKE ONE CAPSULE BY MOUTH TWICE DAILY. 09/28/15   Rogene Houston, MD    Family History Family History  Problem Relation Age of Onset  . Colon cancer Sister     Social History Social History  Substance Use Topics  . Smoking status: Former Smoker    Packs/day: 1.50    Years: 50.00    Types: Cigarettes    Quit date: 05/29/2007  . Smokeless tobacco: Never Used  . Alcohol use 0.6 oz/week    1 Standard drinks or equivalent per week     Comment: very rare     Allergies   Contrast media [iodinated diagnostic agents] and Other   Review of Systems Review of Systems  HENT: Positive for facial swelling.   Skin: Positive for rash.  All other systems reviewed and are negative.    Physical Exam Updated Vital Signs BP 136/75 (BP Location: Left Arm)   Pulse (!) 56   Temp 98.1 F (36.7 C) (Temporal)    Resp 16   Ht 5\' 11"  (1.803 m)   Wt 185 lb (83.9 kg)   SpO2 98%   BMI 25.80 kg/m   Physical Exam  Constitutional: He is oriented to person, place, and time. He appears well-developed and well-nourished. No distress.  HENT:  Head: Normocephalic and atraumatic.  Mild angioedema to upper and lower lip. No oral swelling.  Eyes: Conjunctivae are normal.  Cardiovascular: Normal rate.   Pulmonary/Chest: Effort normal and breath sounds normal. No stridor.  Lungs clear to auscultation bilaterally.  Abdominal: Soft. He exhibits no distension. There is no tenderness.  Abdomen soft and nontender  Neurological: He is alert and oriented to person, place, and time.  Skin: Skin  is warm and dry.  One bruised area on right buttock  Psychiatric: He has a normal mood and affect.  Nursing note and vitals reviewed.    ED Treatments / Results  COORDINATION OF CARE:  12:09 PM Discussed treatment plan with pt at bedside and pt agreed to plan.  Labs (all labs ordered are listed, but only abnormal results are displayed) Labs Reviewed - No data to display  EKG  EKG Interpretation None       Radiology No results found.  Procedures Procedures (including critical care time)  Medications Ordered in ED Medications  predniSONE (DELTASONE) tablet 60 mg (60 mg Oral Given 04/28/16 1219)     Initial Impression / Assessment and Plan / ED Course  I have reviewed the triage vital signs and the nursing notes.  Pertinent labs & imaging results that were available during my care of the patient were reviewed by me and considered in my medical decision making (see chart for details).  Clinical Course     Patient with angioedema of his lips. No intraoral involvement. No clear cause but with further questioning found out that his father had had his tongue swelling at times without a clear cause. Hereditary angioedema considered. Will give course of steroids. Patient is on aspirin and await held for the  next week and then restarted. Will follow-up with primary care doctor or allergy. It is slowly progressing chest and likely will not have an acute worsening. Will return if continued symptoms or worsening of symptoms.  Final Clinical Impressions(s) / ED Diagnoses   Final diagnoses:  Angioedema of lips, initial encounter    New Prescriptions New Prescriptions   PREDNISONE (DELTASONE) 20 MG TABLET    Take 2 tablets (40 mg total) by mouth daily.  I personally performed the services described in this documentation, which was scribed in my presence. The recorded information has been reviewed and is accurate.       Davonna Belling, MD 04/28/16 1248

## 2016-04-28 NOTE — ED Triage Notes (Addendum)
Patient c/o swelling to lips. Per patient started yesterday. Denies any difficulty breathing or swallowing. Per patient only new food he has been eating recently is bananas. Patient does take Lopressor.  Patient states it started with some "whelps" and itching to buttock bilaterally.

## 2016-05-02 ENCOUNTER — Encounter (INDEPENDENT_AMBULATORY_CARE_PROVIDER_SITE_OTHER): Payer: Self-pay | Admitting: Internal Medicine

## 2016-05-23 DIAGNOSIS — Z0000 Encounter for general adult medical examination without abnormal findings: Secondary | ICD-10-CM | POA: Diagnosis not present

## 2016-06-18 DIAGNOSIS — N528 Other male erectile dysfunction: Secondary | ICD-10-CM | POA: Diagnosis not present

## 2016-06-18 DIAGNOSIS — N138 Other obstructive and reflux uropathy: Secondary | ICD-10-CM | POA: Diagnosis not present

## 2016-06-18 DIAGNOSIS — N401 Enlarged prostate with lower urinary tract symptoms: Secondary | ICD-10-CM | POA: Diagnosis not present

## 2016-06-18 DIAGNOSIS — R972 Elevated prostate specific antigen [PSA]: Secondary | ICD-10-CM | POA: Diagnosis not present

## 2016-06-26 ENCOUNTER — Encounter: Payer: Self-pay | Admitting: Internal Medicine

## 2016-06-29 DIAGNOSIS — H903 Sensorineural hearing loss, bilateral: Secondary | ICD-10-CM | POA: Diagnosis not present

## 2016-07-06 DIAGNOSIS — R7301 Impaired fasting glucose: Secondary | ICD-10-CM | POA: Diagnosis not present

## 2016-07-06 DIAGNOSIS — I10 Essential (primary) hypertension: Secondary | ICD-10-CM | POA: Diagnosis not present

## 2016-07-06 DIAGNOSIS — E785 Hyperlipidemia, unspecified: Secondary | ICD-10-CM | POA: Diagnosis not present

## 2016-07-06 DIAGNOSIS — E119 Type 2 diabetes mellitus without complications: Secondary | ICD-10-CM | POA: Diagnosis not present

## 2016-07-06 DIAGNOSIS — N529 Male erectile dysfunction, unspecified: Secondary | ICD-10-CM | POA: Diagnosis not present

## 2016-07-06 DIAGNOSIS — E782 Mixed hyperlipidemia: Secondary | ICD-10-CM | POA: Diagnosis not present

## 2016-07-06 DIAGNOSIS — I482 Chronic atrial fibrillation: Secondary | ICD-10-CM | POA: Diagnosis not present

## 2016-07-06 DIAGNOSIS — E039 Hypothyroidism, unspecified: Secondary | ICD-10-CM | POA: Diagnosis not present

## 2016-07-09 DIAGNOSIS — N401 Enlarged prostate with lower urinary tract symptoms: Secondary | ICD-10-CM | POA: Diagnosis not present

## 2016-07-09 DIAGNOSIS — M25569 Pain in unspecified knee: Secondary | ICD-10-CM | POA: Diagnosis not present

## 2016-07-09 DIAGNOSIS — K219 Gastro-esophageal reflux disease without esophagitis: Secondary | ICD-10-CM | POA: Diagnosis not present

## 2016-07-09 DIAGNOSIS — D649 Anemia, unspecified: Secondary | ICD-10-CM | POA: Diagnosis not present

## 2016-07-09 DIAGNOSIS — K8051 Calculus of bile duct without cholangitis or cholecystitis with obstruction: Secondary | ICD-10-CM | POA: Diagnosis not present

## 2016-07-09 DIAGNOSIS — F5101 Primary insomnia: Secondary | ICD-10-CM | POA: Diagnosis not present

## 2016-07-09 DIAGNOSIS — D696 Thrombocytopenia, unspecified: Secondary | ICD-10-CM | POA: Diagnosis not present

## 2016-07-09 DIAGNOSIS — M79673 Pain in unspecified foot: Secondary | ICD-10-CM | POA: Diagnosis not present

## 2016-07-09 DIAGNOSIS — E782 Mixed hyperlipidemia: Secondary | ICD-10-CM | POA: Diagnosis not present

## 2016-07-12 DIAGNOSIS — Z87898 Personal history of other specified conditions: Secondary | ICD-10-CM | POA: Diagnosis not present

## 2016-07-12 DIAGNOSIS — L821 Other seborrheic keratosis: Secondary | ICD-10-CM | POA: Diagnosis not present

## 2016-07-12 DIAGNOSIS — Z86018 Personal history of other benign neoplasm: Secondary | ICD-10-CM | POA: Diagnosis not present

## 2016-07-12 DIAGNOSIS — D225 Melanocytic nevi of trunk: Secondary | ICD-10-CM | POA: Diagnosis not present

## 2016-07-12 DIAGNOSIS — D485 Neoplasm of uncertain behavior of skin: Secondary | ICD-10-CM | POA: Diagnosis not present

## 2016-07-12 DIAGNOSIS — Z23 Encounter for immunization: Secondary | ICD-10-CM | POA: Diagnosis not present

## 2016-07-12 DIAGNOSIS — L820 Inflamed seborrheic keratosis: Secondary | ICD-10-CM | POA: Diagnosis not present

## 2016-07-12 DIAGNOSIS — Z85828 Personal history of other malignant neoplasm of skin: Secondary | ICD-10-CM | POA: Diagnosis not present

## 2016-08-14 ENCOUNTER — Ambulatory Visit (INDEPENDENT_AMBULATORY_CARE_PROVIDER_SITE_OTHER): Payer: Medicare Other | Admitting: Internal Medicine

## 2016-08-14 ENCOUNTER — Encounter (INDEPENDENT_AMBULATORY_CARE_PROVIDER_SITE_OTHER): Payer: Self-pay | Admitting: Internal Medicine

## 2016-08-14 ENCOUNTER — Encounter (INDEPENDENT_AMBULATORY_CARE_PROVIDER_SITE_OTHER): Payer: Self-pay

## 2016-08-14 VITALS — BP 118/70 | HR 62 | Temp 97.5°F | Resp 18 | Ht 71.0 in | Wt 191.9 lb

## 2016-08-14 DIAGNOSIS — K219 Gastro-esophageal reflux disease without esophagitis: Secondary | ICD-10-CM

## 2016-08-14 DIAGNOSIS — K831 Obstruction of bile duct: Secondary | ICD-10-CM | POA: Diagnosis not present

## 2016-08-14 DIAGNOSIS — K830 Cholangitis: Secondary | ICD-10-CM | POA: Diagnosis not present

## 2016-08-14 DIAGNOSIS — K8309 Other cholangitis: Secondary | ICD-10-CM

## 2016-08-14 DIAGNOSIS — K9189 Other postprocedural complications and disorders of digestive system: Secondary | ICD-10-CM

## 2016-08-14 MED ORDER — CIPROFLOXACIN HCL 500 MG PO TABS: 500.0000 mg | ORAL_TABLET | Freq: Two times a day (BID) | ORAL | 1 refills | Status: DC

## 2016-08-14 NOTE — Patient Instructions (Signed)
Begin Cipro as soon as you experience fever chills and abdominal pain and call office.

## 2016-08-14 NOTE — Progress Notes (Signed)
Presenting complaint;  Follow-up for recurrent cholangitis and GERD.  Database and Subjective:  Alexander Burton is 76 year old Caucasian male who is here for scheduled visit accompanied by his wife [name]. He was last seen in for 08/15/2015. 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 He has required multiple interventions. He developed cholangitis in August 2017 and had external/internal stenting at [facility]. Stricture was restented in October 2017 and subsequently removed on 04/03/2016. He also has history of GERD complicated by distal esophageal stricture. Esophageal dilation was performed in August 2016 when he was also found to have esophageal web.  Patient's wife states that he had another episode of fever and right gutters about 2 weeks ago. This occurred after he he was on his bulldozer. She says every time he uses bulldozer he gets sick. [name] usually start him on Cipro and he got better. He took Cipro for about 5 days. He needs new prescription. He denies heartburn or dysphagia. He is on Nexium but does not know the dose. He will call office to let us know. He remains with good appetite and his weight has been stable.   Current Medications: Outpatient Encounter Prescriptions as of 08/14/2016  Medication Sig  . aspirin EC 81 MG tablet Take 81 mg by mouth every evening.  . cycloSPORINE (RESTASIS) 0.05 % ophthalmic emulsion Place 1 drop into both eyes 2 (two) times daily.   Marland Kitchen docusate sodium (COLACE) 100 MG capsule Take 2 capsules (200 mg total) by mouth daily. (Patient taking differently: Take 200 mg by mouth daily as needed. )  . esomeprazole (NEXIUM) 40 MG capsule Take 40 mg by mouth daily before breakfast.    . finasteride (PROSCAR) 5 MG tablet Take 5 mg by mouth every morning.   . metoprolol tartrate (LOPRESSOR) 25 MG tablet TAKE ONE-HALF TABLET BY MOUTH TWICE DAILY  . Multiple Vitamin (MULITIVITAMIN WITH MINERALS)  TABS Take 1 tablet by mouth every morning.   . Multiple Vitamins-Minerals (PRESERVISION AREDS) CAPS Take by mouth daily.  . pravastatin (PRAVACHOL) 20 MG tablet TAKE ONE TABLET BY MOUTH ONCE DAILY.  . Probiotic Product (PROBIOTIC DAILY PO) Take 1 tablet by mouth daily.  . tamsulosin (FLOMAX) 0.4 MG CAPS capsule Take 0.4 mg by mouth daily after supper.   . ursodiol (ACTIGALL) 300 MG capsule TAKE ONE CAPSULE BY MOUTH TWICE DAILY.  . [DISCONTINUED] predniSONE (DELTASONE) 20 MG tablet Take 2 tablets (40 mg total) by mouth daily. (Patient not taking: Reported on 08/14/2016)   No facility-administered encounter medications on file as of 08/14/2016.      Objective: Blood pressure 118/70, pulse 62, temperature 97.5 F (36.4 C), temperature source Oral, resp. rate 18, height 5\' 11"  (1.803 m), weight 191 lb 14.4 oz (87 kg). Patient is alert and in no acute distress. Conjunctiva is pink. Sclera is nonicteric Oropharyngeal mucosa is normal. No neck masses or thyromegaly noted. Cardiac exam with regular rhythm normal S1 and S2. No murmur or gallop noted. Lungs are clear to auscultation. Abdomen. He has right subcostal and horizontal scar across upper abdomen. Abdomen is soft and nontender without organomegaly or masses.  No LE edema or clubbing noted.  Labs/studies Results:   LFTs are poorly normal last month by Dr. [name].  Assessment:  #1. History of recurrent cholangitis secondary to anastomotic hepaticojejunostomy stricture which has been dilated and stented on multiple occasions. He had external/internal stent placed in August 2017; it was changed to large stent in October  2017 and subsequently removed in November 2017. He had another mild episode of cholangitis responding to by mouth Cipro. I believe it is near [place] that last episode occurred after he used bulldozer. Since he is having recurrent spells of cholangitis he may want to ask his physicians at [facility] if this stricture could  be dilated periodically in order to prevent future episodes of cholangitis. #2. GERD complicated by distal esophageal stricture. He is doing well with therapy. I believe he is on OTC Nexium. He will call and confirm this.   Plan:  New prescription given for Cipro 500 mg by mouth twice a day for 2 weeks. He will use it on as-needed basis for 5-7 days each time. If he does not respond to Cipro within 2 days he will call office. Will request copy of recent lab from Dr. Oneida Arenas called office for review. Office visit in 6 months.

## 2016-09-07 ENCOUNTER — Encounter (INDEPENDENT_AMBULATORY_CARE_PROVIDER_SITE_OTHER): Payer: Self-pay | Admitting: *Deleted

## 2016-09-19 DIAGNOSIS — K830 Cholangitis: Secondary | ICD-10-CM | POA: Diagnosis not present

## 2016-09-19 DIAGNOSIS — Z9049 Acquired absence of other specified parts of digestive tract: Secondary | ICD-10-CM | POA: Diagnosis not present

## 2016-09-19 DIAGNOSIS — R509 Fever, unspecified: Secondary | ICD-10-CM | POA: Diagnosis not present

## 2016-09-24 ENCOUNTER — Other Ambulatory Visit: Payer: Self-pay | Admitting: Cardiology

## 2016-09-27 DIAGNOSIS — R5382 Chronic fatigue, unspecified: Secondary | ICD-10-CM | POA: Diagnosis not present

## 2016-09-27 DIAGNOSIS — R935 Abnormal findings on diagnostic imaging of other abdominal regions, including retroperitoneum: Secondary | ICD-10-CM | POA: Diagnosis not present

## 2016-09-27 DIAGNOSIS — R748 Abnormal levels of other serum enzymes: Secondary | ICD-10-CM | POA: Diagnosis not present

## 2016-09-27 DIAGNOSIS — Z9049 Acquired absence of other specified parts of digestive tract: Secondary | ICD-10-CM | POA: Diagnosis not present

## 2016-09-27 DIAGNOSIS — K831 Obstruction of bile duct: Secondary | ICD-10-CM | POA: Diagnosis not present

## 2016-09-27 DIAGNOSIS — R509 Fever, unspecified: Secondary | ICD-10-CM | POA: Diagnosis not present

## 2016-09-27 DIAGNOSIS — K830 Cholangitis: Secondary | ICD-10-CM | POA: Diagnosis not present

## 2016-10-01 DIAGNOSIS — Z7982 Long term (current) use of aspirin: Secondary | ICD-10-CM | POA: Diagnosis not present

## 2016-10-01 DIAGNOSIS — I10 Essential (primary) hypertension: Secondary | ICD-10-CM | POA: Diagnosis not present

## 2016-10-01 DIAGNOSIS — E785 Hyperlipidemia, unspecified: Secondary | ICD-10-CM | POA: Diagnosis not present

## 2016-10-01 DIAGNOSIS — G4730 Sleep apnea, unspecified: Secondary | ICD-10-CM | POA: Diagnosis not present

## 2016-10-01 DIAGNOSIS — Z955 Presence of coronary angioplasty implant and graft: Secondary | ICD-10-CM | POA: Diagnosis not present

## 2016-10-01 DIAGNOSIS — K831 Obstruction of bile duct: Secondary | ICD-10-CM | POA: Diagnosis not present

## 2016-10-01 DIAGNOSIS — N400 Enlarged prostate without lower urinary tract symptoms: Secondary | ICD-10-CM | POA: Diagnosis not present

## 2016-10-01 DIAGNOSIS — Z85820 Personal history of malignant melanoma of skin: Secondary | ICD-10-CM | POA: Diagnosis not present

## 2016-10-01 DIAGNOSIS — I2510 Atherosclerotic heart disease of native coronary artery without angina pectoris: Secondary | ICD-10-CM | POA: Diagnosis not present

## 2016-10-01 DIAGNOSIS — K830 Cholangitis: Secondary | ICD-10-CM | POA: Diagnosis not present

## 2016-10-01 DIAGNOSIS — Z79899 Other long term (current) drug therapy: Secondary | ICD-10-CM | POA: Diagnosis not present

## 2016-10-01 DIAGNOSIS — Z91041 Radiographic dye allergy status: Secondary | ICD-10-CM | POA: Diagnosis not present

## 2016-10-02 DIAGNOSIS — I10 Essential (primary) hypertension: Secondary | ICD-10-CM | POA: Diagnosis not present

## 2016-10-02 DIAGNOSIS — E785 Hyperlipidemia, unspecified: Secondary | ICD-10-CM | POA: Diagnosis not present

## 2016-10-02 DIAGNOSIS — I2510 Atherosclerotic heart disease of native coronary artery without angina pectoris: Secondary | ICD-10-CM | POA: Diagnosis not present

## 2016-10-02 DIAGNOSIS — N400 Enlarged prostate without lower urinary tract symptoms: Secondary | ICD-10-CM | POA: Diagnosis not present

## 2016-10-02 DIAGNOSIS — K831 Obstruction of bile duct: Secondary | ICD-10-CM | POA: Diagnosis not present

## 2016-10-02 DIAGNOSIS — G4730 Sleep apnea, unspecified: Secondary | ICD-10-CM | POA: Diagnosis not present

## 2016-10-08 DIAGNOSIS — R945 Abnormal results of liver function studies: Secondary | ICD-10-CM | POA: Diagnosis not present

## 2016-10-16 DIAGNOSIS — K830 Cholangitis: Secondary | ICD-10-CM | POA: Diagnosis not present

## 2016-10-16 DIAGNOSIS — Z9049 Acquired absence of other specified parts of digestive tract: Secondary | ICD-10-CM | POA: Diagnosis not present

## 2016-10-16 DIAGNOSIS — Z9889 Other specified postprocedural states: Secondary | ICD-10-CM | POA: Diagnosis not present

## 2016-11-13 DIAGNOSIS — K830 Cholangitis: Secondary | ICD-10-CM | POA: Diagnosis not present

## 2016-11-13 DIAGNOSIS — K831 Obstruction of bile duct: Secondary | ICD-10-CM | POA: Diagnosis not present

## 2016-12-14 DIAGNOSIS — Z85820 Personal history of malignant melanoma of skin: Secondary | ICD-10-CM | POA: Diagnosis not present

## 2016-12-14 DIAGNOSIS — K831 Obstruction of bile duct: Secondary | ICD-10-CM | POA: Diagnosis not present

## 2016-12-14 DIAGNOSIS — Z8619 Personal history of other infectious and parasitic diseases: Secondary | ICD-10-CM | POA: Diagnosis not present

## 2016-12-14 DIAGNOSIS — R161 Splenomegaly, not elsewhere classified: Secondary | ICD-10-CM | POA: Diagnosis not present

## 2016-12-14 DIAGNOSIS — R61 Generalized hyperhidrosis: Secondary | ICD-10-CM | POA: Diagnosis not present

## 2016-12-14 DIAGNOSIS — I4949 Other premature depolarization: Secondary | ICD-10-CM | POA: Diagnosis not present

## 2016-12-14 DIAGNOSIS — D696 Thrombocytopenia, unspecified: Secondary | ICD-10-CM | POA: Diagnosis not present

## 2016-12-14 DIAGNOSIS — I10 Essential (primary) hypertension: Secondary | ICD-10-CM | POA: Diagnosis present

## 2016-12-14 DIAGNOSIS — K830 Cholangitis: Secondary | ICD-10-CM | POA: Diagnosis not present

## 2016-12-14 DIAGNOSIS — Z91041 Radiographic dye allergy status: Secondary | ICD-10-CM | POA: Diagnosis not present

## 2016-12-14 DIAGNOSIS — E785 Hyperlipidemia, unspecified: Secondary | ICD-10-CM | POA: Diagnosis present

## 2016-12-14 DIAGNOSIS — Z955 Presence of coronary angioplasty implant and graft: Secondary | ICD-10-CM | POA: Diagnosis not present

## 2016-12-14 DIAGNOSIS — N400 Enlarged prostate without lower urinary tract symptoms: Secondary | ICD-10-CM | POA: Diagnosis not present

## 2016-12-14 DIAGNOSIS — I739 Peripheral vascular disease, unspecified: Secondary | ICD-10-CM | POA: Diagnosis present

## 2016-12-14 DIAGNOSIS — Z87891 Personal history of nicotine dependence: Secondary | ICD-10-CM | POA: Diagnosis not present

## 2016-12-14 DIAGNOSIS — R1900 Intra-abdominal and pelvic swelling, mass and lump, unspecified site: Secondary | ICD-10-CM | POA: Diagnosis not present

## 2016-12-14 DIAGNOSIS — I480 Paroxysmal atrial fibrillation: Secondary | ICD-10-CM | POA: Diagnosis not present

## 2016-12-14 DIAGNOSIS — K838 Other specified diseases of biliary tract: Secondary | ICD-10-CM | POA: Diagnosis not present

## 2016-12-14 DIAGNOSIS — R410 Disorientation, unspecified: Secondary | ICD-10-CM | POA: Diagnosis not present

## 2016-12-14 DIAGNOSIS — G4730 Sleep apnea, unspecified: Secondary | ICD-10-CM | POA: Diagnosis not present

## 2016-12-14 DIAGNOSIS — R140 Abdominal distension (gaseous): Secondary | ICD-10-CM | POA: Diagnosis not present

## 2016-12-14 DIAGNOSIS — I4891 Unspecified atrial fibrillation: Secondary | ICD-10-CM | POA: Diagnosis not present

## 2016-12-14 DIAGNOSIS — I2510 Atherosclerotic heart disease of native coronary artery without angina pectoris: Secondary | ICD-10-CM | POA: Diagnosis not present

## 2016-12-14 DIAGNOSIS — R001 Bradycardia, unspecified: Secondary | ICD-10-CM | POA: Diagnosis not present

## 2016-12-14 DIAGNOSIS — R935 Abnormal findings on diagnostic imaging of other abdominal regions, including retroperitoneum: Secondary | ICD-10-CM | POA: Diagnosis not present

## 2016-12-14 DIAGNOSIS — R630 Anorexia: Secondary | ICD-10-CM | POA: Diagnosis not present

## 2016-12-14 DIAGNOSIS — Z7982 Long term (current) use of aspirin: Secondary | ICD-10-CM | POA: Diagnosis not present

## 2016-12-14 DIAGNOSIS — R509 Fever, unspecified: Secondary | ICD-10-CM | POA: Diagnosis not present

## 2016-12-14 DIAGNOSIS — Z9049 Acquired absence of other specified parts of digestive tract: Secondary | ICD-10-CM | POA: Diagnosis not present

## 2017-01-01 DIAGNOSIS — K830 Cholangitis: Secondary | ICD-10-CM | POA: Diagnosis not present

## 2017-01-01 DIAGNOSIS — K831 Obstruction of bile duct: Secondary | ICD-10-CM | POA: Diagnosis not present

## 2017-01-03 ENCOUNTER — Other Ambulatory Visit (INDEPENDENT_AMBULATORY_CARE_PROVIDER_SITE_OTHER): Payer: Self-pay | Admitting: Internal Medicine

## 2017-01-08 DIAGNOSIS — I10 Essential (primary) hypertension: Secondary | ICD-10-CM | POA: Diagnosis not present

## 2017-01-10 DIAGNOSIS — E782 Mixed hyperlipidemia: Secondary | ICD-10-CM | POA: Diagnosis not present

## 2017-01-10 DIAGNOSIS — F5101 Primary insomnia: Secondary | ICD-10-CM | POA: Diagnosis not present

## 2017-01-10 DIAGNOSIS — K8031 Calculus of bile duct with cholangitis, unspecified, with obstruction: Secondary | ICD-10-CM | POA: Diagnosis not present

## 2017-01-10 DIAGNOSIS — D649 Anemia, unspecified: Secondary | ICD-10-CM | POA: Diagnosis not present

## 2017-01-10 DIAGNOSIS — M79673 Pain in unspecified foot: Secondary | ICD-10-CM | POA: Diagnosis not present

## 2017-01-10 DIAGNOSIS — Z6825 Body mass index (BMI) 25.0-25.9, adult: Secondary | ICD-10-CM | POA: Diagnosis not present

## 2017-01-10 DIAGNOSIS — K219 Gastro-esophageal reflux disease without esophagitis: Secondary | ICD-10-CM | POA: Diagnosis not present

## 2017-01-10 DIAGNOSIS — N401 Enlarged prostate with lower urinary tract symptoms: Secondary | ICD-10-CM | POA: Diagnosis not present

## 2017-01-17 NOTE — Progress Notes (Signed)
Cardiology Office Note  Date: 01/18/2017   ID: Alexander Burton, Alexander Burton 01/01/1941, MRN [id number]  PCP: Celene Squibb, MD  Primary Cardiologist: Rozann Lesches, MD   Chief Complaint  Patient presents with  . Cardiac follow-up    History of Present Illness: Alexander Burton is a 76 y.o. male last seen in November 2017. He is here today with his wife. He continues to follow at [facility] with history of complex hepatobiliary disease, was recently seen in early August. Records indicate hospitalization at [facility] back in July with recurrent episode of cholangitis. He underwent MRCP and ultimately PBD placement. From a cardiac perspective he was noted to have significant bradycardia with heart rate down into the 30s and 40s at times prompting discontinuation of metoprolol. He then went into rapid atrial fibrillation, was seen by cardiology, eventually placed back on beta blocker and observed. He did have nocturnal bradycardia but was otherwise asymptomatic. Echocardiogram was obtained and reported moderate LVH with LVEF estimated at 40%, mild mitral regurgitation, and trivial tricuspid regurgitation.  He presents for follow-up today reporting no palpitations, chest pain, or unusual shortness of breath. States that he has been afebrile. He has follow-up pending at [facility] in mid September.  Reviewed his medications. He continue his on aspirin, Pravachol, and low-dose metoprolol. I partially reviewed his ECG today which shows sinus bradycardia with PVC, low voltage, and nonspecific ST-T changes.  Echocardiogram and Myoview from 2016 are outlined below.  Past Medical History:  Diagnosis Date  . BPH (benign prostatic hyperplasia)   . CAD (coronary artery disease)    DES to LAD 06/2008  . Colon polyps   . COPD (chronic obstructive pulmonary disease) ([place])   . Gastritis   . Goiter   . 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 History of syncope January 2010   PVC's and14 beats  NSVT as well as atrial afib  . Hyperlipidemia   . Melanoma ([place])   . Osteoarthritis   . PAF (paroxysmal atrial fibrillation) ([place])   . Prostate CA ([place])   . Sleep apnea 04/02/2005    Past Surgical History:  Procedure Laterality Date  . CARDIAC CATHETERIZATION  Feb 2010   post DES to LAD ,3.5 X 33 mm Cypher stent,dilated up to 3.8 mm 80 to90% stenosis btwn first and second diag branc w.moderate disease EF 50%  . CHOLECYSTECTOMY     Complicated by rupture of common bile duct and other vascular structures. Has now had multiple issues with chronic hepatic abscess. Currently has indwelling drain  . COLONOSCOPY  08/16/2011   Procedure: COLONOSCOPY;  Surgeon: Rogene Houston, MD;  [facility];  Service: Endoscopy;  Laterality: N/A;  1200  . ESOPHAGEAL DILATION N/A 01/20/2015   Procedure: ESOPHAGEAL DILATION;  Surgeon: Rogene Houston, MD;  [facility];  Service: Endoscopy;  Laterality: N/A;  . ESOPHAGOGASTRODUODENOSCOPY N/A 01/20/2015   Procedure: ESOPHAGOGASTRODUODENOSCOPY (EGD);  Surgeon: Rogene Houston, MD;  [facility];  Service: Endoscopy;  Laterality: N/A;  1200  . LEA DOPPLER  07/28/2008   NORMAL RGT GROIN DUPLEX DOPPLER  . liver stent    . LIVER SURGERY  09-03/14   @ [facility]  . Melanoma resection      Current Outpatient Prescriptions  Medication Sig Dispense Refill  . aspirin EC 81 MG tablet Take 81 mg by mouth every evening.    . cycloSPORINE (RESTASIS) 0.05 % ophthalmic emulsion Place 1 drop into both  eyes 2 (two) times daily.     . finasteride (PROSCAR) 5 MG tablet Take 5 mg by mouth every morning.     . metoprolol tartrate (LOPRESSOR) 25 MG tablet TAKE ONE-HALF TABLET BY MOUTH TWICE DAILY 90 tablet 1  . Multiple Vitamin (MULITIVITAMIN WITH MINERALS) TABS Take 1 tablet by mouth every morning.     . Multiple Vitamins-Minerals (PRESERVISION AREDS) CAPS Take by mouth daily.    . pravastatin (PRAVACHOL) 20 MG tablet TAKE ONE TABLET BY MOUTH ONCE  DAILY. 90 tablet 3  . Probiotic Product (PROBIOTIC DAILY PO) Take 1 tablet by mouth daily.    . tamsulosin (FLOMAX) 0.4 MG CAPS capsule Take 0.4 mg by mouth daily after supper.   3  . ursodiol (ACTIGALL) 300 MG capsule TAKE ONE CAPSULE BY MOUTH TWICE DAILY 180 capsule 11   No current facility-administered medications for this visit.    Allergies:  Contrast media [iodinated diagnostic agents] and Other   Social History: The patient  reports that he quit smoking about 9 years ago. His smoking use included Cigarettes. He has a 75.00 pack-year smoking history. He has never used smokeless tobacco. He reports that he drinks about 0.6 oz of alcohol per week . He reports that he does not use drugs.   ROS:  Please see the history of present illness. Otherwise, complete review of systems is positive for none.  All other systems are reviewed and negative.   Physical Exam: VS:  BP 124/64   Pulse (!) 57   Ht 5\' 11"  (1.803 m)   Wt 187 lb (84.8 kg)   BMI 26.08 kg/m , BMI Body mass index is 26.08 kg/m.  Wt Readings from Last 3 Encounters:  01/18/17 187 lb (84.8 kg)  08/14/16 191 lb 14.4 oz (87 kg)  04/28/16 185 lb (83.9 kg)    General: Elderly male, appears comfortable at rest. HEENT: Conjunctiva and lids normal, oropharynx clear. Neck: Supple, no elevated JVP or carotid bruits, no thyromegaly. Lungs: Clear to auscultation, nonlabored breathing at rest. Cardiac: Regular rate and rhythm with occasional ectopy, no S3, soft systolic murmur, no pericardial rub. Abdomen: Soft, nontender, healed surgical scars noted, bowel sounds present. Extremities: No pitting edema, distal pulses 2+. Skin: Warm and dry. Musculoskeletal: No kyphosis. Neuropsychiatric: Alert and oriented x3, affect grossly appropriate.  ECG: I personally reviewed the tracing from 04/24/2016 which showed sinus bradycardia with nonspecific T-wave abnormalities.  Recent Labwork:  August 2018: Hemoglobin 13.3, potassium 4.1,  creatinine 0.9, BUN 16, AST 20, ALT 15  Other Studies Reviewed Today:  Echocardiogram 05/04/2015: Study Conclusions  - Left ventricle: The cavity size was normal. Wall thickness was   increased in a pattern of mild LVH. Systolic function was normal.   The estimated ejection fraction was in the range of 60% to 65%.   Doppler parameters are consistent with abnormal left ventricular   relaxation (grade 1 diastolic dysfunction). - Regional wall motion abnormality: Moderate hypokinesis of the mid   inferior and mid inferolateral myocardium; mild hypokinesis of   the basal inferolateral myocardium. - Aortic valve: Mildly to moderately calcified annulus. Trileaflet. - Mitral valve: Very mild posterior leaflet prolapse. There was   mild regurgitation. - Left atrium: The atrium was moderately to severely dilated. - Right ventricle: The cavity size was mildly dilated. - Right atrium: The atrium was mildly to moderately dilated.  Lexiscan Myoview 04/19/2015:  There was no ST segment deviation noted during stress.  The left ventricular ejection fraction is moderately decreased (  30-44%).  There is a medium sized moderate intensity fixed basal inferolateral defect seen in both the resting and post-injection images. There is severe radiotracer uptake in the adjacent gut which may create this artifact, however cannot rule moderate prior infarct. There is no current ischemia.  This is an intermediate risk study based on low ejection fraction. There is no significant myocardium currently at jeopardy.  Assessment and Plan:  1. Paroxysmal atrial fibrillation, this has been documented in the past as well. I reviewed his recent records from [place]. Although he has had some bradycardia when in sinus rhythm, he is not clearly symptomatic and we will plan to continue low-dose Lopressor for now. Otherwise continue aspirin, reluctant to start Sandy for stroke prophylaxis now with anticipated further GI  procedures in the near future. CHADSVASC score is 4 we will continue to discuss.  2. Tachycardia-bradycardia syndrome based on recent records. No clear indication for pacemaker at this time.  3. Secondary cardiomyopathy, LVEF 40% range by echocardiogram done at [facility] in July. Our last echocardiogram in 2016 revealed LVEF 60-65%. Question secondary to acute illness and also rapid recurring atrial fibrillation. We will need to reassess echocardiogram prior to his next visit and can make medication adjustments from there.  4. CAD status post DES to the LAD in 2010. Myoview from 2016 revealed inferolateral scar without active ischemia. He does not report any angina at this time.  Current medicines were reviewed with the patient today.   Orders Placed This Encounter  Procedures  . EKG 12-Lead  . ECHOCARDIOGRAM COMPLETE    Disposition: Follow-up in 6-8 weeks.  Signed, Satira Sark, MD, [facility] 01/18/2017 10:33 AM    [facility] [place]. [address] [place], [place] [address] Phone: [phone]; Fax: [phone]

## 2017-01-18 ENCOUNTER — Ambulatory Visit (INDEPENDENT_AMBULATORY_CARE_PROVIDER_SITE_OTHER): Payer: Medicare Other | Admitting: Cardiology

## 2017-01-18 ENCOUNTER — Encounter: Payer: Self-pay | Admitting: Cardiology

## 2017-01-18 VITALS — BP 124/64 | HR 57 | Ht 71.0 in | Wt 187.0 lb

## 2017-01-18 DIAGNOSIS — I480 Paroxysmal atrial fibrillation: Secondary | ICD-10-CM

## 2017-01-18 DIAGNOSIS — I2510 Atherosclerotic heart disease of native coronary artery without angina pectoris: Secondary | ICD-10-CM | POA: Diagnosis not present

## 2017-01-18 DIAGNOSIS — I495 Sick sinus syndrome: Secondary | ICD-10-CM | POA: Diagnosis not present

## 2017-01-18 DIAGNOSIS — I429 Cardiomyopathy, unspecified: Secondary | ICD-10-CM

## 2017-01-18 NOTE — Patient Instructions (Signed)
Medication Instructions:  Your physician recommends that you continue on your current medications as directed. Please refer to the Current Medication list given to you today.   Labwork: NONE  Testing/Procedures: Your physician has requested that you have an echocardiogram. Echocardiography is a painless test that uses sound waves to create images of your heart. It provides your doctor with information about the size and shape of your heart and how well your heart's chambers and valves are working. This procedure takes approximately one hour. There are no restrictions for this procedure.    Follow-Up: Your physician recommends that you schedule a follow-up appointment in: 6-8 Weeks   Any Other Special Instructions Will Be Listed Below (If Applicable).     If you need a refill on your cardiac medications before your next appointment, please call your pharmacy.  Thank you for choosing [facility]!

## 2017-02-06 DIAGNOSIS — K831 Obstruction of bile duct: Secondary | ICD-10-CM | POA: Diagnosis not present

## 2017-02-07 DIAGNOSIS — Z79899 Other long term (current) drug therapy: Secondary | ICD-10-CM | POA: Diagnosis not present

## 2017-02-07 DIAGNOSIS — E876 Hypokalemia: Secondary | ICD-10-CM | POA: Diagnosis not present

## 2017-02-07 DIAGNOSIS — T85520D Displacement of bile duct prosthesis, subsequent encounter: Secondary | ICD-10-CM | POA: Diagnosis not present

## 2017-02-07 DIAGNOSIS — R7881 Bacteremia: Secondary | ICD-10-CM | POA: Diagnosis present

## 2017-02-07 DIAGNOSIS — Z7982 Long term (current) use of aspirin: Secondary | ICD-10-CM | POA: Diagnosis not present

## 2017-02-07 DIAGNOSIS — B9689 Other specified bacterial agents as the cause of diseases classified elsewhere: Secondary | ICD-10-CM | POA: Diagnosis present

## 2017-02-07 DIAGNOSIS — G4730 Sleep apnea, unspecified: Secondary | ICD-10-CM | POA: Diagnosis present

## 2017-02-07 DIAGNOSIS — I2510 Atherosclerotic heart disease of native coronary artery without angina pectoris: Secondary | ICD-10-CM | POA: Diagnosis present

## 2017-02-07 DIAGNOSIS — R338 Other retention of urine: Secondary | ICD-10-CM | POA: Diagnosis not present

## 2017-02-07 DIAGNOSIS — N401 Enlarged prostate with lower urinary tract symptoms: Secondary | ICD-10-CM | POA: Diagnosis not present

## 2017-02-07 DIAGNOSIS — N400 Enlarged prostate without lower urinary tract symptoms: Secondary | ICD-10-CM | POA: Diagnosis present

## 2017-02-07 DIAGNOSIS — Z955 Presence of coronary angioplasty implant and graft: Secondary | ICD-10-CM | POA: Diagnosis not present

## 2017-02-07 DIAGNOSIS — E785 Hyperlipidemia, unspecified: Secondary | ICD-10-CM | POA: Diagnosis present

## 2017-02-07 DIAGNOSIS — I10 Essential (primary) hypertension: Secondary | ICD-10-CM | POA: Diagnosis present

## 2017-02-07 DIAGNOSIS — Z91041 Radiographic dye allergy status: Secondary | ICD-10-CM | POA: Diagnosis not present

## 2017-02-07 DIAGNOSIS — B9620 Unspecified Escherichia coli [E. coli] as the cause of diseases classified elsewhere: Secondary | ICD-10-CM | POA: Diagnosis present

## 2017-02-07 DIAGNOSIS — K831 Obstruction of bile duct: Secondary | ICD-10-CM | POA: Diagnosis not present

## 2017-02-07 DIAGNOSIS — K830 Cholangitis: Secondary | ICD-10-CM | POA: Diagnosis present

## 2017-02-07 DIAGNOSIS — Z9049 Acquired absence of other specified parts of digestive tract: Secondary | ICD-10-CM | POA: Diagnosis not present

## 2017-02-15 DIAGNOSIS — Z23 Encounter for immunization: Secondary | ICD-10-CM | POA: Diagnosis not present

## 2017-02-22 ENCOUNTER — Ambulatory Visit (HOSPITAL_COMMUNITY)
Admission: RE | Admit: 2017-02-22 | Discharge: 2017-02-22 | Disposition: A | Discharge status: Home / Self Care | Payer: Medicare Other | Source: Ambulatory Visit | Attending: Cardiology | Admitting: Cardiology

## 2017-02-22 DIAGNOSIS — I10 Essential (primary) hypertension: Secondary | ICD-10-CM | POA: Insufficient documentation

## 2017-02-22 DIAGNOSIS — E785 Hyperlipidemia, unspecified: Secondary | ICD-10-CM | POA: Insufficient documentation

## 2017-02-22 DIAGNOSIS — I429 Cardiomyopathy, unspecified: Secondary | ICD-10-CM | POA: Diagnosis not present

## 2017-02-22 DIAGNOSIS — I4891 Unspecified atrial fibrillation: Secondary | ICD-10-CM | POA: Insufficient documentation

## 2017-02-22 DIAGNOSIS — Z87891 Personal history of nicotine dependence: Secondary | ICD-10-CM | POA: Diagnosis not present

## 2017-02-22 DIAGNOSIS — I2510 Atherosclerotic heart disease of native coronary artery without angina pectoris: Secondary | ICD-10-CM | POA: Insufficient documentation

## 2017-02-22 NOTE — Progress Notes (Signed)
*  PRELIMINARY RESULTS* Echocardiogram 2D Echocardiogram has been performed.  [name] 02/22/2017, 12:03 PM

## 2017-02-26 DIAGNOSIS — Z9049 Acquired absence of other specified parts of digestive tract: Secondary | ICD-10-CM | POA: Diagnosis not present

## 2017-02-26 DIAGNOSIS — R1114 Bilious vomiting: Secondary | ICD-10-CM | POA: Diagnosis not present

## 2017-02-26 DIAGNOSIS — K8309 Other cholangitis: Secondary | ICD-10-CM | POA: Diagnosis not present

## 2017-02-26 DIAGNOSIS — K831 Obstruction of bile duct: Secondary | ICD-10-CM | POA: Diagnosis not present

## 2017-02-26 DIAGNOSIS — R110 Nausea: Secondary | ICD-10-CM | POA: Diagnosis not present

## 2017-03-04 DIAGNOSIS — R1114 Bilious vomiting: Secondary | ICD-10-CM | POA: Diagnosis not present

## 2017-03-04 DIAGNOSIS — Z9049 Acquired absence of other specified parts of digestive tract: Secondary | ICD-10-CM | POA: Diagnosis not present

## 2017-03-04 DIAGNOSIS — R110 Nausea: Secondary | ICD-10-CM | POA: Diagnosis not present

## 2017-03-04 DIAGNOSIS — K831 Obstruction of bile duct: Secondary | ICD-10-CM | POA: Diagnosis not present

## 2017-03-08 ENCOUNTER — Ambulatory Visit (INDEPENDENT_AMBULATORY_CARE_PROVIDER_SITE_OTHER): Payer: Medicare Other | Admitting: Cardiology

## 2017-03-08 ENCOUNTER — Encounter: Payer: Self-pay | Admitting: Cardiology

## 2017-03-08 VITALS — BP 112/76 | HR 58 | Ht 70.0 in | Wt 180.0 lb

## 2017-03-08 DIAGNOSIS — I429 Cardiomyopathy, unspecified: Secondary | ICD-10-CM | POA: Diagnosis not present

## 2017-03-08 DIAGNOSIS — I495 Sick sinus syndrome: Secondary | ICD-10-CM

## 2017-03-08 DIAGNOSIS — I480 Paroxysmal atrial fibrillation: Secondary | ICD-10-CM | POA: Diagnosis not present

## 2017-03-08 DIAGNOSIS — I2510 Atherosclerotic heart disease of native coronary artery without angina pectoris: Secondary | ICD-10-CM

## 2017-03-08 NOTE — Patient Instructions (Signed)
Your physician wants you to follow-up in: 4 months with Dr.McDowell You will receive a reminder letter in the mail two months in advance. If you don't receive a letter, please call our office to schedule the follow-up appointment.    Your physician recommends that you continue on your current medications as directed. Please refer to the Current Medication list given to you today.    If you need a refill on your cardiac medications before your next appointment, please call your pharmacy.      No lab work or testing ordered today.      Thank you for choosing [facility] !

## 2017-03-08 NOTE — Progress Notes (Signed)
Cardiology Office Note  Date: 03/08/2017   ID: Nolin, Grell 1940/08/01, MRN [id number]  PCP: Celene Squibb, MD  Primary Cardiologist: Rozann Lesches, MD   Chief Complaint  Patient presents with  . PAF    History of Present Illness: Alexander Burton is a 76 y.o. male last seen in August. He presents today with his wife for a follow-up visit. Unfortunately, he was rehospitalized since I saw him, back at [facility] with recurrent cholangitis, underwent replacement of PBD. He is not aware of any recurrent atrial arrhythmias during his most recent hospitalization. Does not report chest pain or palpitations. No lightheadedness or syncope.  I reviewed his medications are stable from a cardiac perspective. He continues on aspirin and low-dose Lopressor.  Follow-up echocardiogram in September showed normal LVEF at 55-60%. This represented an improvement compared to interval study done at [facility]. He reports NYHA class II dyspnea.  Past Medical History:  Diagnosis Date  . BPH (benign prostatic hyperplasia)   . CAD (coronary artery disease)    DES to LAD 06/2008  . Colon polyps   . COPD (chronic obstructive pulmonary disease) ([place])   . Gastritis   . Goiter   . 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 History of syncope January 2010   PVC's and14 beats NSVT as well as atrial afib  . Hyperlipidemia   . Melanoma ([place])   . Osteoarthritis   . PAF (paroxysmal atrial fibrillation) ([place])   . Prostate CA ([place])   . Sleep apnea 04/02/2005    Past Surgical History:  Procedure Laterality Date  . CARDIAC CATHETERIZATION  Feb 2010   post DES to LAD ,3.5 X 33 mm Cypher stent,dilated up to 3.8 mm 80 to90% stenosis btwn first and second diag branc w.moderate disease EF 50%  . CHOLECYSTECTOMY     Complicated by rupture of common bile duct and other vascular structures. Has now had multiple issues with chronic hepatic abscess. Currently has indwelling drain  .  COLONOSCOPY  08/16/2011   Procedure: COLONOSCOPY;  Surgeon: Rogene Houston, MD;  [facility];  Service: Endoscopy;  Laterality: N/A;  1200  . ESOPHAGEAL DILATION N/A 01/20/2015   Procedure: ESOPHAGEAL DILATION;  Surgeon: Rogene Houston, MD;  Location: [facility];  Service: Endoscopy;  Laterality: N/A;  . ESOPHAGOGASTRODUODENOSCOPY N/A 01/20/2015   Procedure: ESOPHAGOGASTRODUODENOSCOPY (EGD);  Surgeon: Rogene Houston, MD;  Location: [facility];  Service: Endoscopy;  Laterality: N/A;  1200  . LEA DOPPLER  07/28/2008   NORMAL RGT GROIN DUPLEX DOPPLER  . liver stent    . LIVER SURGERY  09-03/14   @ [facility]  . Melanoma resection      Current Outpatient Prescriptions  Medication Sig Dispense Refill  . aspirin EC 81 MG tablet Take 81 mg by mouth every evening.    . cycloSPORINE (RESTASIS) 0.05 % ophthalmic emulsion Place 1 drop into both eyes 2 (two) times daily.     . finasteride (PROSCAR) 5 MG tablet Take 5 mg by mouth every morning.     . metoprolol tartrate (LOPRESSOR) 25 MG tablet TAKE ONE-HALF TABLET BY MOUTH TWICE DAILY 90 tablet 1  . Multiple Vitamin (MULITIVITAMIN WITH MINERALS) TABS Take 1 tablet by mouth every morning.     . Multiple Vitamins-Minerals (PRESERVISION AREDS) CAPS Take by mouth daily.    . pravastatin (PRAVACHOL) 20 MG tablet TAKE ONE TABLET BY MOUTH ONCE DAILY. 90 tablet 3  .  Probiotic Product (PROBIOTIC DAILY PO) Take 1 tablet by mouth daily.    . tamsulosin (FLOMAX) 0.4 MG CAPS capsule Take 0.4 mg by mouth daily after supper.   3  . ursodiol (ACTIGALL) 300 MG capsule TAKE ONE CAPSULE BY MOUTH TWICE DAILY 180 capsule 11   No current facility-administered medications for this visit.    Allergies:  Contrast media [iodinated diagnostic agents] and Other   Social History: The patient  reports that he quit smoking about 9 years ago. His smoking use included Cigarettes. He has a 75.00 pack-year smoking history. He has never used smokeless tobacco. He reports  that he drinks about 0.6 oz of alcohol per week . He reports that he does not use drugs.   ROS:  Please see the history of present illness. Otherwise, complete review of systems is positive for none. No recent fevers or chills, no abdominal pain or emesis.  All other systems are reviewed and negative.   Physical Exam: VS:  BP 112/76   Pulse (!) 58   Ht 5\' 10"  (1.778 m)   Wt 180 lb (81.6 kg)   SpO2 96%   BMI 25.83 kg/m , BMI Body mass index is 25.83 kg/m.  Wt Readings from Last 3 Encounters:  03/08/17 180 lb (81.6 kg)  01/18/17 187 lb (84.8 kg)  08/14/16 191 lb 14.4 oz (87 kg)    General: Elderly male, appears comfortable at rest. HEENT: Conjunctiva and lids normal, oropharynx clear. Neck: Supple, no elevated JVP or carotid bruits, no thyromegaly. Lungs: Clear to auscultation, nonlabored breathing at rest. Cardiac: Regular rate and rhythm with occasional PVC, no S3, soft systolic murmur. Abdomen: Soft, nontender, healed surgical scars, bowel sounds present. Extremities: No pitting edema, distal pulses 2+. Skin: Warm and dry. Musculoskeletal: No kyphosis. Neuropsychiatric: Alert and oriented x3, affect grossly appropriate.  ECG: I personally reviewed the tracing from 01/18/2017 which showed sinus bradycardia with PVC, low voltage, nonspecific ST-T changes.  Recent Labwork:  October 2018: Hemoglobin 14.6, potassium 4.2, creatinine 1.0, AST 22, ALT 17  Other Studies Reviewed Today:  Echocardiogram 02/22/2017: Study Conclusions  - Left ventricle: The cavity size was normal. Wall thickness was   increased in a pattern of mild LVH. Systolic function was normal.   The estimated ejection fraction was in the range of 55% to 60%.   Wall motion was normal; there were no regional wall motion   abnormalities. Doppler parameters are consistent with abnormal   left ventricular relaxation (grade 1 diastolic dysfunction). - Aortic valve: Valve area (VTI): 2.91 cm^2. Valve area (Vmax):    2.25 cm^2. - Left atrium: The atrium was mildly to moderately dilated. - Technically adequate study.  Assessment and Plan:  1. Paroxysmal atrial fibrillation CHADSVASC score of 4. Our plan is to continue aspirin and low-dose Lopressor for now. We have considered starting DOAC, however in light of his fairly unpredictable presentations and need for repeat GI interventions over the last several months, we will hold off until he demonstrates more stability.  2. Secondary cardiomyopathy, LVEF normal by follow-up echocardiogram in September.  3. CAD with history of DES to the LAD in 2010. He had evidence of inferolateral scar without active ischemia by follow-up Myoview in 2016. No angina at this time. Continued on aspirin and statin.  4. Tachycardia-bradycardia syndrome. At this point asymptomatic and tolerating low-dose Lopressor.  Current medicines were reviewed with the patient today.  Disposition: Follow-up in 4 months.  Signed, Satira Sark, MD, [facility] 03/08/2017 10:40 AM  [facility] [address], [place], [place] [address] Phone: [phone]; Fax: [phone]

## 2017-03-19 DIAGNOSIS — R5383 Other fatigue: Secondary | ICD-10-CM | POA: Diagnosis not present

## 2017-03-19 DIAGNOSIS — K831 Obstruction of bile duct: Secondary | ICD-10-CM | POA: Diagnosis not present

## 2017-03-19 DIAGNOSIS — Z9049 Acquired absence of other specified parts of digestive tract: Secondary | ICD-10-CM | POA: Diagnosis not present

## 2017-03-19 DIAGNOSIS — K8309 Other cholangitis: Secondary | ICD-10-CM | POA: Diagnosis not present

## 2017-03-19 DIAGNOSIS — R110 Nausea: Secondary | ICD-10-CM | POA: Diagnosis not present

## 2017-03-26 DIAGNOSIS — R509 Fever, unspecified: Secondary | ICD-10-CM | POA: Diagnosis not present

## 2017-03-26 DIAGNOSIS — K8309 Other cholangitis: Secondary | ICD-10-CM | POA: Diagnosis not present

## 2017-03-26 DIAGNOSIS — K831 Obstruction of bile duct: Secondary | ICD-10-CM | POA: Diagnosis not present

## 2017-04-08 DIAGNOSIS — K8309 Other cholangitis: Secondary | ICD-10-CM | POA: Diagnosis not present

## 2017-04-08 DIAGNOSIS — K831 Obstruction of bile duct: Secondary | ICD-10-CM | POA: Diagnosis not present

## 2017-04-12 ENCOUNTER — Other Ambulatory Visit: Payer: Self-pay | Admitting: Cardiology

## 2017-05-01 ENCOUNTER — Other Ambulatory Visit: Payer: Self-pay | Admitting: Cardiology

## 2017-05-09 DIAGNOSIS — K8309 Other cholangitis: Secondary | ICD-10-CM | POA: Diagnosis not present

## 2017-05-09 DIAGNOSIS — K831 Obstruction of bile duct: Secondary | ICD-10-CM | POA: Diagnosis not present

## 2017-05-09 DIAGNOSIS — R110 Nausea: Secondary | ICD-10-CM | POA: Diagnosis not present

## 2017-05-09 DIAGNOSIS — R1114 Bilious vomiting: Secondary | ICD-10-CM | POA: Diagnosis not present

## 2017-05-15 DIAGNOSIS — H43822 Vitreomacular adhesion, left eye: Secondary | ICD-10-CM | POA: Diagnosis not present

## 2017-05-15 DIAGNOSIS — H35361 Drusen (degenerative) of macula, right eye: Secondary | ICD-10-CM | POA: Diagnosis not present

## 2017-05-15 DIAGNOSIS — H353132 Nonexudative age-related macular degeneration, bilateral, intermediate dry stage: Secondary | ICD-10-CM | POA: Diagnosis not present

## 2017-05-15 DIAGNOSIS — H43821 Vitreomacular adhesion, right eye: Secondary | ICD-10-CM | POA: Diagnosis not present

## 2017-06-04 DIAGNOSIS — K8309 Other cholangitis: Secondary | ICD-10-CM | POA: Diagnosis not present

## 2017-06-12 DIAGNOSIS — N400 Enlarged prostate without lower urinary tract symptoms: Secondary | ICD-10-CM | POA: Diagnosis not present

## 2017-06-12 DIAGNOSIS — Z7982 Long term (current) use of aspirin: Secondary | ICD-10-CM | POA: Diagnosis not present

## 2017-06-12 DIAGNOSIS — Z955 Presence of coronary angioplasty implant and graft: Secondary | ICD-10-CM | POA: Diagnosis not present

## 2017-06-12 DIAGNOSIS — E785 Hyperlipidemia, unspecified: Secondary | ICD-10-CM | POA: Diagnosis not present

## 2017-06-12 DIAGNOSIS — K8309 Other cholangitis: Secondary | ICD-10-CM | POA: Diagnosis not present

## 2017-06-12 DIAGNOSIS — I10 Essential (primary) hypertension: Secondary | ICD-10-CM | POA: Diagnosis not present

## 2017-06-12 DIAGNOSIS — K831 Obstruction of bile duct: Secondary | ICD-10-CM | POA: Diagnosis not present

## 2017-06-12 DIAGNOSIS — Z79899 Other long term (current) drug therapy: Secondary | ICD-10-CM | POA: Diagnosis not present

## 2017-06-13 DIAGNOSIS — K8309 Other cholangitis: Secondary | ICD-10-CM | POA: Diagnosis not present

## 2017-06-13 DIAGNOSIS — Z9049 Acquired absence of other specified parts of digestive tract: Secondary | ICD-10-CM | POA: Diagnosis not present

## 2017-06-13 DIAGNOSIS — Z7982 Long term (current) use of aspirin: Secondary | ICD-10-CM | POA: Diagnosis not present

## 2017-06-13 DIAGNOSIS — N400 Enlarged prostate without lower urinary tract symptoms: Secondary | ICD-10-CM | POA: Diagnosis not present

## 2017-06-13 DIAGNOSIS — K831 Obstruction of bile duct: Secondary | ICD-10-CM | POA: Diagnosis not present

## 2017-06-13 DIAGNOSIS — E785 Hyperlipidemia, unspecified: Secondary | ICD-10-CM | POA: Diagnosis not present

## 2017-06-13 DIAGNOSIS — I10 Essential (primary) hypertension: Secondary | ICD-10-CM | POA: Diagnosis not present

## 2017-06-24 DIAGNOSIS — R972 Elevated prostate specific antigen [PSA]: Secondary | ICD-10-CM | POA: Diagnosis not present

## 2017-06-24 DIAGNOSIS — R351 Nocturia: Secondary | ICD-10-CM | POA: Diagnosis not present

## 2017-06-24 DIAGNOSIS — N528 Other male erectile dysfunction: Secondary | ICD-10-CM | POA: Diagnosis not present

## 2017-06-24 DIAGNOSIS — N401 Enlarged prostate with lower urinary tract symptoms: Secondary | ICD-10-CM | POA: Diagnosis not present

## 2017-06-28 DIAGNOSIS — Z9049 Acquired absence of other specified parts of digestive tract: Secondary | ICD-10-CM | POA: Diagnosis not present

## 2017-06-28 DIAGNOSIS — K831 Obstruction of bile duct: Secondary | ICD-10-CM | POA: Diagnosis not present

## 2017-06-28 DIAGNOSIS — K8309 Other cholangitis: Secondary | ICD-10-CM | POA: Diagnosis not present

## 2017-07-16 DIAGNOSIS — D649 Anemia, unspecified: Secondary | ICD-10-CM | POA: Diagnosis not present

## 2017-07-16 DIAGNOSIS — E782 Mixed hyperlipidemia: Secondary | ICD-10-CM | POA: Diagnosis not present

## 2017-07-18 DIAGNOSIS — K8051 Calculus of bile duct without cholangitis or cholecystitis with obstruction: Secondary | ICD-10-CM | POA: Diagnosis not present

## 2017-07-18 DIAGNOSIS — K219 Gastro-esophageal reflux disease without esophagitis: Secondary | ICD-10-CM | POA: Diagnosis not present

## 2017-07-18 DIAGNOSIS — F5101 Primary insomnia: Secondary | ICD-10-CM | POA: Diagnosis not present

## 2017-07-18 DIAGNOSIS — D696 Thrombocytopenia, unspecified: Secondary | ICD-10-CM | POA: Diagnosis not present

## 2017-07-18 DIAGNOSIS — Z6825 Body mass index (BMI) 25.0-25.9, adult: Secondary | ICD-10-CM | POA: Diagnosis not present

## 2017-07-18 DIAGNOSIS — E782 Mixed hyperlipidemia: Secondary | ICD-10-CM | POA: Diagnosis not present

## 2017-07-18 DIAGNOSIS — K5900 Constipation, unspecified: Secondary | ICD-10-CM | POA: Diagnosis not present

## 2017-07-18 DIAGNOSIS — N400 Enlarged prostate without lower urinary tract symptoms: Secondary | ICD-10-CM | POA: Diagnosis not present

## 2017-07-18 DIAGNOSIS — D649 Anemia, unspecified: Secondary | ICD-10-CM | POA: Diagnosis not present

## 2017-07-23 DIAGNOSIS — Z85828 Personal history of other malignant neoplasm of skin: Secondary | ICD-10-CM | POA: Diagnosis not present

## 2017-07-23 DIAGNOSIS — Z86018 Personal history of other benign neoplasm: Secondary | ICD-10-CM | POA: Diagnosis not present

## 2017-07-23 DIAGNOSIS — D485 Neoplasm of uncertain behavior of skin: Secondary | ICD-10-CM | POA: Diagnosis not present

## 2017-07-23 DIAGNOSIS — L821 Other seborrheic keratosis: Secondary | ICD-10-CM | POA: Diagnosis not present

## 2017-07-23 DIAGNOSIS — Z87898 Personal history of other specified conditions: Secondary | ICD-10-CM | POA: Diagnosis not present

## 2017-07-23 DIAGNOSIS — D0472 Carcinoma in situ of skin of left lower limb, including hip: Secondary | ICD-10-CM | POA: Diagnosis not present

## 2017-07-23 DIAGNOSIS — L570 Actinic keratosis: Secondary | ICD-10-CM | POA: Diagnosis not present

## 2017-08-06 DIAGNOSIS — K8309 Other cholangitis: Secondary | ICD-10-CM | POA: Diagnosis not present

## 2017-08-06 DIAGNOSIS — K831 Obstruction of bile duct: Secondary | ICD-10-CM | POA: Diagnosis not present

## 2017-08-23 ENCOUNTER — Other Ambulatory Visit (INDEPENDENT_AMBULATORY_CARE_PROVIDER_SITE_OTHER): Payer: Self-pay | Admitting: Internal Medicine

## 2017-09-03 ENCOUNTER — Ambulatory Visit (INDEPENDENT_AMBULATORY_CARE_PROVIDER_SITE_OTHER): Payer: Medicare Other | Admitting: Internal Medicine

## 2017-10-08 ENCOUNTER — Encounter (INDEPENDENT_AMBULATORY_CARE_PROVIDER_SITE_OTHER): Payer: Self-pay | Admitting: Internal Medicine

## 2017-10-08 ENCOUNTER — Ambulatory Visit (INDEPENDENT_AMBULATORY_CARE_PROVIDER_SITE_OTHER): Payer: Medicare Other | Admitting: Internal Medicine

## 2017-10-08 VITALS — BP 120/80 | HR 60 | Temp 97.8°F | Resp 18 | Ht 71.0 in | Wt 184.9 lb

## 2017-10-08 DIAGNOSIS — K9189 Other postprocedural complications and disorders of digestive system: Secondary | ICD-10-CM | POA: Diagnosis not present

## 2017-10-08 DIAGNOSIS — R5383 Other fatigue: Secondary | ICD-10-CM | POA: Diagnosis not present

## 2017-10-08 DIAGNOSIS — K831 Obstruction of bile duct: Secondary | ICD-10-CM | POA: Diagnosis not present

## 2017-10-08 LAB — COMPREHENSIVE METABOLIC PANEL
AG Ratio: 1.6 (calc) (ref 1.0–2.5)
ALBUMIN MSPROF: 3.7 g/dL (ref 3.6–5.1)
ALT: 15 U/L (ref 9–46)
AST: 18 U/L (ref 10–35)
Alkaline phosphatase (APISO): 132 U/L — ABNORMAL HIGH (ref 40–115)
BILIRUBIN TOTAL: 1.1 mg/dL (ref 0.2–1.2)
BUN: 15 mg/dL (ref 7–25)
CO2: 29 mmol/L (ref 20–32)
CREATININE: 1.02 mg/dL (ref 0.70–1.18)
Calcium: 8.8 mg/dL (ref 8.6–10.3)
Chloride: 105 mmol/L (ref 98–110)
Globulin: 2.3 g/dL (calc) (ref 1.9–3.7)
Glucose, Bld: 88 mg/dL (ref 65–139)
POTASSIUM: 4.4 mmol/L (ref 3.5–5.3)
SODIUM: 140 mmol/L (ref 135–146)
TOTAL PROTEIN: 6 g/dL — AB (ref 6.1–8.1)

## 2017-10-08 LAB — CBC
HEMATOCRIT: 38.9 % (ref 38.5–50.0)
Hemoglobin: 13.3 g/dL (ref 13.2–17.1)
MCH: 29.4 pg (ref 27.0–33.0)
MCHC: 34.2 g/dL (ref 32.0–36.0)
MCV: 85.9 fL (ref 80.0–100.0)
MPV: 12.3 fL (ref 7.5–12.5)
PLATELETS: 103 10*3/uL — AB (ref 140–400)
RBC: 4.53 10*6/uL (ref 4.20–5.80)
RDW: 13.8 % (ref 11.0–15.0)
WBC: 5.2 10*3/uL (ref 3.8–10.8)

## 2017-10-08 LAB — TSH: TSH: 1.92 mIU/L (ref 0.40–4.50)

## 2017-10-08 MED ORDER — URSODIOL 250 MG PO TABS: 250.0000 mg | ORAL_TABLET | Freq: Two times a day (BID) | ORAL | 11 refills | Status: DC

## 2017-10-08 NOTE — Patient Instructions (Signed)
Physician will call with results of blood test when completed. 

## 2017-10-08 NOTE — Progress Notes (Signed)
Presenting complaint;  Patient complains of feeling weak and tired. History of biliary anastomotic stricture.  Database and subjective:  Alexander Burton is 77 year old Caucasian male who has history of cholangitis secondary to hepatico- jejunostomy anastomotic stricture requiring multiple interventions at [facility].  This stricture manifested more than 20 years after bile duct injury.  He has required multiple interventions at [facility].  He had 8 mm x 40 mm biliary endoprosthesis placed in September last year.  He had external system removed in January 2019.  Patient is accompanied by his wife.  Since his last intervention he has not had an episode of abdominal pain or fever and has not needed an antibiotic.  He has Cipro prescription with clear instructions on when to take it by his physician at [facility]. Patient states he does not have a good appetite.  He has lost 7 pounds since his last visit.  He also complains of feeling extremely tired at times.  His wife states he has had 2 spells in the last 4 months.  When he has a spell he just goes and stays in bed for a while.  He denies nausea vomiting fever or chills.  He also denies melena or rectal bleeding.  Bowels generally move every other day.  His wife states that he stays busy and she feels at times he walks too much and that may be the reason for the spells.  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   Patient's wife also complains about high co-pay for Urso.     Current Medications: Outpatient Encounter Medications as of 10/08/2017  Medication Sig  . aspirin EC 81 MG tablet Take 81 mg by mouth every evening.  . cycloSPORINE (RESTASIS) 0.05 % ophthalmic emulsion Place 1 drop into both eyes 2 (two) times daily.   . finasteride (PROSCAR) 5 MG tablet Take 5 mg by mouth every morning.   . metoprolol tartrate (LOPRESSOR) 25 MG tablet TAKE 1/2 (ONE-HALF) TABLET BY MOUTH TWICE DAILY  . Multiple Vitamin (MULITIVITAMIN WITH MINERALS) TABS  Take 1 tablet by mouth every morning.   . Multiple Vitamins-Minerals (PRESERVISION AREDS) CAPS Take by mouth daily.  . pravastatin (PRAVACHOL) 20 MG tablet TAKE ONE TABLET BY MOUTH ONCE DAILY  . Probiotic Product (PROBIOTIC DAILY PO) Take 1 tablet by mouth daily.  . tamsulosin (FLOMAX) 0.4 MG CAPS capsule Take 0.4 mg by mouth daily after supper.   . ursodiol (ACTIGALL) 300 MG capsule TAKE 1 CAPSULE BY MOUTH TWICE DAILY   No facility-administered encounter medications on file as of 10/08/2017.      Objective: Blood pressure 120/80, pulse 60, temperature 97.8 F (36.6 C), temperature source Oral, resp. rate 18, height 5\' 11"  (1.803 m), weight 184 lb 14.4 oz (83.9 kg). Patient is alert and in no acute distress. Conjunctiva is pink. Sclera is nonicteric Oropharyngeal mucosa is normal. No neck masses or thyromegaly noted. Cardiac exam with regular rhythm normal S1 and S2. No murmur or gallop noted. Lungs are clear to auscultation. Abdomen he has 2 scars across upper abdomen.  On palpation abdomen is soft and nontender with organomegaly or masses. No LE edema or clubbing noted.  Labs/studies Results: Lab data from 08/06/2017  WBC 5.9 H&H 12.8 and 40.9 and platelet count 81K.  Lab data from 06/28/2017 Bilirubin 1.8, AP 174, AST 31, ALT 29, total protein 6.0 and albumin 3.2. Serum calcium 8.8 and creatinine 1.0.   Assessment:  #1.  History of recurrent cholangitis secondary to biliary anastomotic stricture.  He had permanent stent placed in September 2019 and external stent removed about 4 months ago.  He appears to be doing well.  He does not have any symptoms to suggest biliary obstruction. Bilirubin and alkaline phosphatase are mildly elevated.  Therefore LFTs will be repeated today.  #2.  Tiredness.  Will rule out hypothyroidism and anemia.   Plan:  Change Urso to 50 mg p.o. twice daily.  Hopefully it would be much cheaper. Patient will go to the lab for CBC comprehensive chemistry  panel and TSH. Office visit in 6 months.

## 2017-11-06 DIAGNOSIS — L570 Actinic keratosis: Secondary | ICD-10-CM | POA: Diagnosis not present

## 2017-11-06 DIAGNOSIS — D0472 Carcinoma in situ of skin of left lower limb, including hip: Secondary | ICD-10-CM | POA: Diagnosis not present

## 2017-11-15 DIAGNOSIS — K8309 Other cholangitis: Secondary | ICD-10-CM | POA: Diagnosis not present

## 2017-11-19 DIAGNOSIS — K8309 Other cholangitis: Secondary | ICD-10-CM | POA: Diagnosis not present

## 2017-11-25 DIAGNOSIS — K8309 Other cholangitis: Secondary | ICD-10-CM | POA: Diagnosis not present

## 2017-12-10 DIAGNOSIS — F5101 Primary insomnia: Secondary | ICD-10-CM | POA: Diagnosis not present

## 2017-12-10 DIAGNOSIS — R300 Dysuria: Secondary | ICD-10-CM | POA: Diagnosis not present

## 2017-12-10 DIAGNOSIS — E782 Mixed hyperlipidemia: Secondary | ICD-10-CM | POA: Diagnosis not present

## 2017-12-10 DIAGNOSIS — R112 Nausea with vomiting, unspecified: Secondary | ICD-10-CM | POA: Diagnosis not present

## 2017-12-10 DIAGNOSIS — K8031 Calculus of bile duct with cholangitis, unspecified, with obstruction: Secondary | ICD-10-CM | POA: Diagnosis not present

## 2017-12-10 DIAGNOSIS — R109 Unspecified abdominal pain: Secondary | ICD-10-CM | POA: Diagnosis not present

## 2017-12-10 DIAGNOSIS — D696 Thrombocytopenia, unspecified: Secondary | ICD-10-CM | POA: Diagnosis not present

## 2017-12-10 DIAGNOSIS — K219 Gastro-esophageal reflux disease without esophagitis: Secondary | ICD-10-CM | POA: Diagnosis not present

## 2017-12-10 DIAGNOSIS — N400 Enlarged prostate without lower urinary tract symptoms: Secondary | ICD-10-CM | POA: Diagnosis not present

## 2017-12-10 DIAGNOSIS — Z6825 Body mass index (BMI) 25.0-25.9, adult: Secondary | ICD-10-CM | POA: Diagnosis not present

## 2017-12-10 DIAGNOSIS — D649 Anemia, unspecified: Secondary | ICD-10-CM | POA: Diagnosis not present

## 2017-12-10 DIAGNOSIS — K8051 Calculus of bile duct without cholangitis or cholecystitis with obstruction: Secondary | ICD-10-CM | POA: Diagnosis not present

## 2017-12-26 ENCOUNTER — Other Ambulatory Visit: Payer: Self-pay

## 2017-12-31 DIAGNOSIS — R110 Nausea: Secondary | ICD-10-CM | POA: Diagnosis not present

## 2017-12-31 DIAGNOSIS — R509 Fever, unspecified: Secondary | ICD-10-CM | POA: Diagnosis not present

## 2017-12-31 DIAGNOSIS — K8309 Other cholangitis: Secondary | ICD-10-CM | POA: Diagnosis not present

## 2018-01-02 DIAGNOSIS — R7881 Bacteremia: Secondary | ICD-10-CM | POA: Diagnosis present

## 2018-01-02 DIAGNOSIS — T85590A Other mechanical complication of bile duct prosthesis, initial encounter: Secondary | ICD-10-CM | POA: Diagnosis present

## 2018-01-02 DIAGNOSIS — K831 Obstruction of bile duct: Secondary | ICD-10-CM | POA: Diagnosis present

## 2018-01-02 DIAGNOSIS — Z9049 Acquired absence of other specified parts of digestive tract: Secondary | ICD-10-CM | POA: Diagnosis not present

## 2018-01-02 DIAGNOSIS — Z87891 Personal history of nicotine dependence: Secondary | ICD-10-CM | POA: Diagnosis not present

## 2018-01-02 DIAGNOSIS — G4730 Sleep apnea, unspecified: Secondary | ICD-10-CM | POA: Diagnosis present

## 2018-01-02 DIAGNOSIS — Z7982 Long term (current) use of aspirin: Secondary | ICD-10-CM | POA: Diagnosis not present

## 2018-01-02 DIAGNOSIS — E43 Unspecified severe protein-calorie malnutrition: Secondary | ICD-10-CM | POA: Diagnosis present

## 2018-01-02 DIAGNOSIS — E785 Hyperlipidemia, unspecified: Secondary | ICD-10-CM | POA: Diagnosis present

## 2018-01-02 DIAGNOSIS — Z6824 Body mass index (BMI) 24.0-24.9, adult: Secondary | ICD-10-CM | POA: Diagnosis not present

## 2018-01-02 DIAGNOSIS — R5382 Chronic fatigue, unspecified: Secondary | ICD-10-CM | POA: Diagnosis present

## 2018-01-02 DIAGNOSIS — Z85820 Personal history of malignant melanoma of skin: Secondary | ICD-10-CM | POA: Diagnosis not present

## 2018-01-02 DIAGNOSIS — Z800 Family history of malignant neoplasm of digestive organs: Secondary | ICD-10-CM | POA: Diagnosis not present

## 2018-01-02 DIAGNOSIS — N400 Enlarged prostate without lower urinary tract symptoms: Secondary | ICD-10-CM | POA: Diagnosis present

## 2018-01-02 DIAGNOSIS — Z91041 Radiographic dye allergy status: Secondary | ICD-10-CM | POA: Diagnosis not present

## 2018-01-02 DIAGNOSIS — Z955 Presence of coronary angioplasty implant and graft: Secondary | ICD-10-CM | POA: Diagnosis not present

## 2018-01-02 DIAGNOSIS — K8309 Other cholangitis: Secondary | ICD-10-CM | POA: Diagnosis not present

## 2018-01-02 DIAGNOSIS — I2510 Atherosclerotic heart disease of native coronary artery without angina pectoris: Secondary | ICD-10-CM | POA: Diagnosis present

## 2018-01-02 DIAGNOSIS — Z1623 Resistance to quinolones and fluoroquinolones: Secondary | ICD-10-CM | POA: Diagnosis present

## 2018-01-02 DIAGNOSIS — Z79899 Other long term (current) drug therapy: Secondary | ICD-10-CM | POA: Diagnosis not present

## 2018-01-02 DIAGNOSIS — R509 Fever, unspecified: Secondary | ICD-10-CM | POA: Diagnosis not present

## 2018-01-02 DIAGNOSIS — I10 Essential (primary) hypertension: Secondary | ICD-10-CM | POA: Diagnosis present

## 2018-01-08 DIAGNOSIS — G4730 Sleep apnea, unspecified: Secondary | ICD-10-CM | POA: Diagnosis not present

## 2018-01-08 DIAGNOSIS — Z955 Presence of coronary angioplasty implant and graft: Secondary | ICD-10-CM | POA: Diagnosis not present

## 2018-01-08 DIAGNOSIS — Z452 Encounter for adjustment and management of vascular access device: Secondary | ICD-10-CM | POA: Diagnosis not present

## 2018-01-08 DIAGNOSIS — R7881 Bacteremia: Secondary | ICD-10-CM | POA: Diagnosis not present

## 2018-01-08 DIAGNOSIS — Z87891 Personal history of nicotine dependence: Secondary | ICD-10-CM | POA: Diagnosis not present

## 2018-01-08 DIAGNOSIS — Z792 Long term (current) use of antibiotics: Secondary | ICD-10-CM | POA: Diagnosis not present

## 2018-01-08 DIAGNOSIS — N401 Enlarged prostate with lower urinary tract symptoms: Secondary | ICD-10-CM | POA: Diagnosis not present

## 2018-01-08 DIAGNOSIS — H3530 Unspecified macular degeneration: Secondary | ICD-10-CM | POA: Diagnosis not present

## 2018-01-08 DIAGNOSIS — N400 Enlarged prostate without lower urinary tract symptoms: Secondary | ICD-10-CM | POA: Diagnosis not present

## 2018-01-08 DIAGNOSIS — B961 Klebsiella pneumoniae [K. pneumoniae] as the cause of diseases classified elsewhere: Secondary | ICD-10-CM | POA: Diagnosis not present

## 2018-01-08 DIAGNOSIS — K8309 Other cholangitis: Secondary | ICD-10-CM | POA: Diagnosis not present

## 2018-01-08 DIAGNOSIS — Z7982 Long term (current) use of aspirin: Secondary | ICD-10-CM | POA: Diagnosis not present

## 2018-01-08 DIAGNOSIS — K831 Obstruction of bile duct: Secondary | ICD-10-CM | POA: Diagnosis not present

## 2018-01-08 DIAGNOSIS — I2510 Atherosclerotic heart disease of native coronary artery without angina pectoris: Secondary | ICD-10-CM | POA: Diagnosis not present

## 2018-01-08 DIAGNOSIS — I10 Essential (primary) hypertension: Secondary | ICD-10-CM | POA: Diagnosis not present

## 2018-01-08 DIAGNOSIS — Z85820 Personal history of malignant melanoma of skin: Secondary | ICD-10-CM | POA: Diagnosis not present

## 2018-01-08 DIAGNOSIS — Z9181 History of falling: Secondary | ICD-10-CM | POA: Diagnosis not present

## 2018-01-08 DIAGNOSIS — I4891 Unspecified atrial fibrillation: Secondary | ICD-10-CM | POA: Diagnosis not present

## 2018-01-08 DIAGNOSIS — I739 Peripheral vascular disease, unspecified: Secondary | ICD-10-CM | POA: Diagnosis not present

## 2018-01-08 DIAGNOSIS — E785 Hyperlipidemia, unspecified: Secondary | ICD-10-CM | POA: Diagnosis not present

## 2018-01-13 DIAGNOSIS — R351 Nocturia: Secondary | ICD-10-CM | POA: Diagnosis not present

## 2018-01-13 DIAGNOSIS — N401 Enlarged prostate with lower urinary tract symptoms: Secondary | ICD-10-CM | POA: Diagnosis not present

## 2018-01-13 DIAGNOSIS — R972 Elevated prostate specific antigen [PSA]: Secondary | ICD-10-CM | POA: Diagnosis not present

## 2018-01-13 DIAGNOSIS — N528 Other male erectile dysfunction: Secondary | ICD-10-CM | POA: Diagnosis not present

## 2018-01-17 DIAGNOSIS — R110 Nausea: Secondary | ICD-10-CM | POA: Diagnosis not present

## 2018-01-17 DIAGNOSIS — K8309 Other cholangitis: Secondary | ICD-10-CM | POA: Diagnosis not present

## 2018-01-17 DIAGNOSIS — R7881 Bacteremia: Secondary | ICD-10-CM | POA: Diagnosis not present

## 2018-01-17 DIAGNOSIS — R509 Fever, unspecified: Secondary | ICD-10-CM | POA: Diagnosis not present

## 2018-01-25 DIAGNOSIS — Z794 Long term (current) use of insulin: Secondary | ICD-10-CM | POA: Diagnosis not present

## 2018-01-25 DIAGNOSIS — R1084 Generalized abdominal pain: Secondary | ICD-10-CM | POA: Diagnosis not present

## 2018-01-25 DIAGNOSIS — Z7982 Long term (current) use of aspirin: Secondary | ICD-10-CM | POA: Diagnosis not present

## 2018-01-25 DIAGNOSIS — J189 Pneumonia, unspecified organism: Secondary | ICD-10-CM | POA: Diagnosis not present

## 2018-01-25 DIAGNOSIS — I10 Essential (primary) hypertension: Secondary | ICD-10-CM | POA: Diagnosis not present

## 2018-01-25 DIAGNOSIS — R509 Fever, unspecified: Secondary | ICD-10-CM | POA: Diagnosis not present

## 2018-01-25 DIAGNOSIS — D696 Thrombocytopenia, unspecified: Secondary | ICD-10-CM | POA: Diagnosis present

## 2018-01-25 DIAGNOSIS — Z452 Encounter for adjustment and management of vascular access device: Secondary | ICD-10-CM | POA: Diagnosis not present

## 2018-01-25 DIAGNOSIS — T8579XA Infection and inflammatory reaction due to other internal prosthetic devices, implants and grafts, initial encounter: Secondary | ICD-10-CM | POA: Diagnosis not present

## 2018-01-25 DIAGNOSIS — K8309 Other cholangitis: Secondary | ICD-10-CM | POA: Diagnosis present

## 2018-01-25 DIAGNOSIS — B961 Klebsiella pneumoniae [K. pneumoniae] as the cause of diseases classified elsewhere: Secondary | ICD-10-CM | POA: Diagnosis present

## 2018-01-25 DIAGNOSIS — E785 Hyperlipidemia, unspecified: Secondary | ICD-10-CM | POA: Diagnosis present

## 2018-01-25 DIAGNOSIS — R001 Bradycardia, unspecified: Secondary | ICD-10-CM | POA: Diagnosis not present

## 2018-01-25 DIAGNOSIS — A419 Sepsis, unspecified organism: Secondary | ICD-10-CM | POA: Diagnosis not present

## 2018-01-25 DIAGNOSIS — Z9049 Acquired absence of other specified parts of digestive tract: Secondary | ICD-10-CM | POA: Diagnosis not present

## 2018-01-25 DIAGNOSIS — K219 Gastro-esophageal reflux disease without esophagitis: Secondary | ICD-10-CM | POA: Diagnosis present

## 2018-01-25 DIAGNOSIS — D72829 Elevated white blood cell count, unspecified: Secondary | ICD-10-CM | POA: Diagnosis not present

## 2018-01-25 DIAGNOSIS — A4159 Other Gram-negative sepsis: Secondary | ICD-10-CM | POA: Diagnosis not present

## 2018-01-25 DIAGNOSIS — Z7983 Long term (current) use of bisphosphonates: Secondary | ICD-10-CM | POA: Diagnosis not present

## 2018-01-25 DIAGNOSIS — I2510 Atherosclerotic heart disease of native coronary artery without angina pectoris: Secondary | ICD-10-CM | POA: Diagnosis present

## 2018-01-25 DIAGNOSIS — K761 Chronic passive congestion of liver: Secondary | ICD-10-CM | POA: Diagnosis present

## 2018-01-25 DIAGNOSIS — E119 Type 2 diabetes mellitus without complications: Secondary | ICD-10-CM | POA: Diagnosis not present

## 2018-01-25 DIAGNOSIS — Z955 Presence of coronary angioplasty implant and graft: Secondary | ICD-10-CM | POA: Diagnosis not present

## 2018-01-25 DIAGNOSIS — K5730 Diverticulosis of large intestine without perforation or abscess without bleeding: Secondary | ICD-10-CM | POA: Diagnosis not present

## 2018-01-25 DIAGNOSIS — Z91041 Radiographic dye allergy status: Secondary | ICD-10-CM | POA: Diagnosis not present

## 2018-01-25 DIAGNOSIS — R52 Pain, unspecified: Secondary | ICD-10-CM | POA: Diagnosis not present

## 2018-01-25 DIAGNOSIS — N400 Enlarged prostate without lower urinary tract symptoms: Secondary | ICD-10-CM | POA: Diagnosis not present

## 2018-02-01 DIAGNOSIS — A4159 Other Gram-negative sepsis: Secondary | ICD-10-CM | POA: Diagnosis not present

## 2018-02-01 DIAGNOSIS — K8309 Other cholangitis: Secondary | ICD-10-CM | POA: Diagnosis not present

## 2018-02-01 DIAGNOSIS — I10 Essential (primary) hypertension: Secondary | ICD-10-CM | POA: Diagnosis not present

## 2018-02-01 DIAGNOSIS — B961 Klebsiella pneumoniae [K. pneumoniae] as the cause of diseases classified elsewhere: Secondary | ICD-10-CM | POA: Diagnosis not present

## 2018-02-01 DIAGNOSIS — I2510 Atherosclerotic heart disease of native coronary artery without angina pectoris: Secondary | ICD-10-CM | POA: Diagnosis not present

## 2018-02-01 DIAGNOSIS — N400 Enlarged prostate without lower urinary tract symptoms: Secondary | ICD-10-CM | POA: Diagnosis not present

## 2018-02-03 DIAGNOSIS — B961 Klebsiella pneumoniae [K. pneumoniae] as the cause of diseases classified elsewhere: Secondary | ICD-10-CM | POA: Diagnosis not present

## 2018-02-03 DIAGNOSIS — A4159 Other Gram-negative sepsis: Secondary | ICD-10-CM | POA: Diagnosis not present

## 2018-02-03 DIAGNOSIS — I10 Essential (primary) hypertension: Secondary | ICD-10-CM | POA: Diagnosis not present

## 2018-02-03 DIAGNOSIS — R944 Abnormal results of kidney function studies: Secondary | ICD-10-CM | POA: Diagnosis not present

## 2018-02-03 DIAGNOSIS — I2510 Atherosclerotic heart disease of native coronary artery without angina pectoris: Secondary | ICD-10-CM | POA: Diagnosis not present

## 2018-02-03 DIAGNOSIS — D72819 Decreased white blood cell count, unspecified: Secondary | ICD-10-CM | POA: Diagnosis not present

## 2018-02-03 DIAGNOSIS — N400 Enlarged prostate without lower urinary tract symptoms: Secondary | ICD-10-CM | POA: Diagnosis not present

## 2018-02-03 DIAGNOSIS — K8309 Other cholangitis: Secondary | ICD-10-CM | POA: Diagnosis not present

## 2018-02-07 DIAGNOSIS — Z9049 Acquired absence of other specified parts of digestive tract: Secondary | ICD-10-CM | POA: Diagnosis not present

## 2018-02-07 DIAGNOSIS — K8309 Other cholangitis: Secondary | ICD-10-CM | POA: Diagnosis not present

## 2018-02-07 DIAGNOSIS — K831 Obstruction of bile duct: Secondary | ICD-10-CM | POA: Diagnosis not present

## 2018-02-10 DIAGNOSIS — N400 Enlarged prostate without lower urinary tract symptoms: Secondary | ICD-10-CM | POA: Diagnosis not present

## 2018-02-10 DIAGNOSIS — B961 Klebsiella pneumoniae [K. pneumoniae] as the cause of diseases classified elsewhere: Secondary | ICD-10-CM | POA: Diagnosis not present

## 2018-02-10 DIAGNOSIS — I2510 Atherosclerotic heart disease of native coronary artery without angina pectoris: Secondary | ICD-10-CM | POA: Diagnosis not present

## 2018-02-10 DIAGNOSIS — I10 Essential (primary) hypertension: Secondary | ICD-10-CM | POA: Diagnosis not present

## 2018-02-10 DIAGNOSIS — A4159 Other Gram-negative sepsis: Secondary | ICD-10-CM | POA: Diagnosis not present

## 2018-02-10 DIAGNOSIS — K8309 Other cholangitis: Secondary | ICD-10-CM | POA: Diagnosis not present

## 2018-02-10 DIAGNOSIS — Z792 Long term (current) use of antibiotics: Secondary | ICD-10-CM | POA: Diagnosis not present

## 2018-02-17 DIAGNOSIS — I10 Essential (primary) hypertension: Secondary | ICD-10-CM | POA: Diagnosis not present

## 2018-02-17 DIAGNOSIS — N400 Enlarged prostate without lower urinary tract symptoms: Secondary | ICD-10-CM | POA: Diagnosis not present

## 2018-02-17 DIAGNOSIS — R509 Fever, unspecified: Secondary | ICD-10-CM | POA: Diagnosis not present

## 2018-02-17 DIAGNOSIS — A4159 Other Gram-negative sepsis: Secondary | ICD-10-CM | POA: Diagnosis not present

## 2018-02-17 DIAGNOSIS — I2510 Atherosclerotic heart disease of native coronary artery without angina pectoris: Secondary | ICD-10-CM | POA: Diagnosis not present

## 2018-02-17 DIAGNOSIS — Z452 Encounter for adjustment and management of vascular access device: Secondary | ICD-10-CM | POA: Diagnosis not present

## 2018-02-17 DIAGNOSIS — B961 Klebsiella pneumoniae [K. pneumoniae] as the cause of diseases classified elsewhere: Secondary | ICD-10-CM | POA: Diagnosis not present

## 2018-02-17 DIAGNOSIS — R110 Nausea: Secondary | ICD-10-CM | POA: Diagnosis not present

## 2018-02-17 DIAGNOSIS — K831 Obstruction of bile duct: Secondary | ICD-10-CM | POA: Diagnosis not present

## 2018-02-17 DIAGNOSIS — K8309 Other cholangitis: Secondary | ICD-10-CM | POA: Diagnosis not present

## 2018-02-17 DIAGNOSIS — Z792 Long term (current) use of antibiotics: Secondary | ICD-10-CM | POA: Diagnosis not present

## 2018-02-24 DIAGNOSIS — A4159 Other Gram-negative sepsis: Secondary | ICD-10-CM | POA: Diagnosis not present

## 2018-02-24 DIAGNOSIS — I10 Essential (primary) hypertension: Secondary | ICD-10-CM | POA: Diagnosis not present

## 2018-02-24 DIAGNOSIS — K8309 Other cholangitis: Secondary | ICD-10-CM | POA: Diagnosis not present

## 2018-02-24 DIAGNOSIS — B961 Klebsiella pneumoniae [K. pneumoniae] as the cause of diseases classified elsewhere: Secondary | ICD-10-CM | POA: Diagnosis not present

## 2018-02-24 DIAGNOSIS — N400 Enlarged prostate without lower urinary tract symptoms: Secondary | ICD-10-CM | POA: Diagnosis not present

## 2018-02-24 DIAGNOSIS — I2510 Atherosclerotic heart disease of native coronary artery without angina pectoris: Secondary | ICD-10-CM | POA: Diagnosis not present

## 2018-02-26 DIAGNOSIS — Z0000 Encounter for general adult medical examination without abnormal findings: Secondary | ICD-10-CM | POA: Diagnosis not present

## 2018-02-26 DIAGNOSIS — Z6825 Body mass index (BMI) 25.0-25.9, adult: Secondary | ICD-10-CM | POA: Diagnosis not present

## 2018-02-26 DIAGNOSIS — Z6824 Body mass index (BMI) 24.0-24.9, adult: Secondary | ICD-10-CM | POA: Diagnosis not present

## 2018-02-26 DIAGNOSIS — J060 Acute laryngopharyngitis: Secondary | ICD-10-CM | POA: Diagnosis not present

## 2018-03-03 DIAGNOSIS — I2510 Atherosclerotic heart disease of native coronary artery without angina pectoris: Secondary | ICD-10-CM | POA: Diagnosis not present

## 2018-03-03 DIAGNOSIS — K8309 Other cholangitis: Secondary | ICD-10-CM | POA: Diagnosis not present

## 2018-03-03 DIAGNOSIS — N400 Enlarged prostate without lower urinary tract symptoms: Secondary | ICD-10-CM | POA: Diagnosis not present

## 2018-03-03 DIAGNOSIS — B961 Klebsiella pneumoniae [K. pneumoniae] as the cause of diseases classified elsewhere: Secondary | ICD-10-CM | POA: Diagnosis not present

## 2018-03-03 DIAGNOSIS — I10 Essential (primary) hypertension: Secondary | ICD-10-CM | POA: Diagnosis not present

## 2018-03-03 DIAGNOSIS — A4159 Other Gram-negative sepsis: Secondary | ICD-10-CM | POA: Diagnosis not present

## 2018-03-11 DIAGNOSIS — K831 Obstruction of bile duct: Secondary | ICD-10-CM | POA: Diagnosis not present

## 2018-03-11 DIAGNOSIS — Z9049 Acquired absence of other specified parts of digestive tract: Secondary | ICD-10-CM | POA: Diagnosis not present

## 2018-03-11 DIAGNOSIS — K8309 Other cholangitis: Secondary | ICD-10-CM | POA: Diagnosis not present

## 2018-03-18 DIAGNOSIS — L810 Postinflammatory hyperpigmentation: Secondary | ICD-10-CM | POA: Diagnosis not present

## 2018-03-18 DIAGNOSIS — Z23 Encounter for immunization: Secondary | ICD-10-CM | POA: Diagnosis not present

## 2018-03-18 DIAGNOSIS — L821 Other seborrheic keratosis: Secondary | ICD-10-CM | POA: Diagnosis not present

## 2018-03-22 DIAGNOSIS — Z23 Encounter for immunization: Secondary | ICD-10-CM | POA: Diagnosis not present

## 2018-03-25 DIAGNOSIS — H04123 Dry eye syndrome of bilateral lacrimal glands: Secondary | ICD-10-CM | POA: Diagnosis not present

## 2018-03-25 DIAGNOSIS — Z961 Presence of intraocular lens: Secondary | ICD-10-CM | POA: Diagnosis not present

## 2018-03-25 DIAGNOSIS — H353132 Nonexudative age-related macular degeneration, bilateral, intermediate dry stage: Secondary | ICD-10-CM | POA: Diagnosis not present

## 2018-04-08 DIAGNOSIS — K8309 Other cholangitis: Secondary | ICD-10-CM | POA: Diagnosis not present

## 2018-04-15 ENCOUNTER — Encounter (INDEPENDENT_AMBULATORY_CARE_PROVIDER_SITE_OTHER): Payer: Self-pay | Admitting: Internal Medicine

## 2018-04-15 ENCOUNTER — Ambulatory Visit (INDEPENDENT_AMBULATORY_CARE_PROVIDER_SITE_OTHER): Payer: Medicare Other | Admitting: Internal Medicine

## 2018-04-15 VITALS — BP 118/80 | HR 64 | Temp 97.6°F | Resp 18 | Ht 71.0 in | Wt 179.6 lb

## 2018-04-15 DIAGNOSIS — K8309 Other cholangitis: Secondary | ICD-10-CM | POA: Diagnosis not present

## 2018-04-15 DIAGNOSIS — K9189 Other postprocedural complications and disorders of digestive system: Secondary | ICD-10-CM

## 2018-04-15 DIAGNOSIS — K831 Obstruction of bile duct: Secondary | ICD-10-CM | POA: Diagnosis not present

## 2018-04-15 NOTE — Patient Instructions (Signed)
If you have temp greater than 101 or abdominal pain call office and proceed to emergency room.

## 2018-04-16 NOTE — Progress Notes (Signed)
Presenting complaint;  Follow-up for recurrent cholangitis secondary to anastomotic biliary(hepatico-jejunostomy stricture)  Database and subjective:  Patient is 77 year old Caucasian male who is here for scheduled visit.  He was last seen in the office on 10/08/2017 and he was doing well. Patient developed bile duct injury at the time of cholecystectomy back in 1993.  He was subsequently transferred to at [facility] where he underwent hepaticojejunostomy and did well until October 2012 when he developed liver abscess and subsequently diagnosed with biliary anastomotic stricture.  Patient was referred back to [facility] and he has undergone periodic dilations and in September last year he underwent placement of permanent stent. He did fine until August this year when he developed cholangitis and sepsis and was hospitalized at [facility] and [facility].  He had external biliary drain placement at [facility] on 01/03/2018. He was subsequently seen by Dr. Rogers Blocker of ID service who recommended chronic antibiotic therapy.  He recommended prophylactic therapy as follows. Augmentin for 1 month followed by Bactrim for 1 month followed by Cipro for 1 month and then repeat.  Patient states he is doing well.  His appetite is returned.  He has lost 5 pounds since his last visit.  He feels he may have lost more and gained few pounds back.  He is just wondering what to expect.  He denies nausea vomiting abdominal pain pruritus melena or rectal bleeding. He states he has flushing biliary drain 3 times a day.   Current Medications: Outpatient Encounter Medications as of 04/15/2018  Medication Sig  . amoxicillin-clavulanate (AUGMENTIN) 875-125 MG tablet Take 1 tablet by mouth 2 (two) times daily.   Marland Kitchen aspirin EC 81 MG tablet Take 81 mg by mouth every evening.  . cycloSPORINE (RESTASIS) 0.05 % ophthalmic emulsion Place 1 drop into both eyes 2 (two) times daily.   . finasteride (PROSCAR) 5 MG tablet Take 5  mg by mouth every morning.   . metoprolol tartrate (LOPRESSOR) 25 MG tablet TAKE 1/2 (ONE-HALF) TABLET BY MOUTH TWICE DAILY  . Multiple Vitamin (MULITIVITAMIN WITH MINERALS) TABS Take 1 tablet by mouth every morning.   . Multiple Vitamins-Minerals (PRESERVISION AREDS) CAPS Take by mouth daily.  . pravastatin (PRAVACHOL) 20 MG tablet TAKE ONE TABLET BY MOUTH ONCE DAILY  . Probiotic Product (PROBIOTIC DAILY PO) Take 1 tablet by mouth daily.  . tamsulosin (FLOMAX) 0.4 MG CAPS capsule Take 0.4 mg by mouth daily after supper.   . ursodiol (ACTIGALL) 250 MG tablet Take 1 tablet (250 mg total) by mouth 2 (two) times daily.   No facility-administered encounter medications on file as of 04/15/2018.      Objective: Blood pressure 118/80, pulse 64, temperature 97.6 F (36.4 C), temperature source Oral, resp. rate 18, height 5\' 11"  (1.803 m), weight 179 lb 9.6 oz (81.5 kg). Patient is alert and in no acute distress. Conjunctiva is pink. Sclera is nonicteric Oropharyngeal mucosa is normal. No neck masses or thyromegaly noted. Cardiac exam with regular rhythm normal S1 and S2. No murmur or gallop noted. Lungs are clear to auscultation. Abdomen is symmetrical.  He has 2 scars in right upper quadrant.  He has external biliary drain in place.  It is capped. No LE edema or clubbing noted.  Labs/studies Results: Lab data from 03/11/2018 WBC 6.0, H&H 13.5 and 41.4  platelet count 109K.  Glucose 80 BUN 12 and serum creatinine 1.1 AST 30, ALT 22 and alkaline phosphatase 178 Serum albumin 3.3.  Assessment:  #1.  Recurrent cholangitis secondary to hepatic or jejunal anastomotic stricture resulting from remote bile duct injury.  Patient is now is on chronic antibiotic therapy as outlined and recommended by Dr. Rogers Blocker of ID service at [facility].  Patient is status post placement of 8 French percutaneous biliary drain via previously placed(September 2018) stent. Patient is doing well at the present time.  He is  at risk for drain occlusion and cholangitis.  Urso dose has been decreased because of cost issues.  #2.  Thrombocytopenia.  He has thrombocytopenia for over 4 years.  One has to wonder if he has occult secondary biliary cirrhosis.  #3.  Chronic GERD complicated by esophageal stricture last dilated in August 2016.  He is doing well   Plan:  Patient will continue p.o. antibiotic regiment as recommended by [facility] of ID service at [facility]. Patient advised to report to the emergency room if he develops right upper quadrant abdominal pain fever and rigors. Office visit in April 2020.

## 2018-05-05 DIAGNOSIS — K8309 Other cholangitis: Secondary | ICD-10-CM | POA: Diagnosis not present

## 2018-05-05 DIAGNOSIS — K831 Obstruction of bile duct: Secondary | ICD-10-CM | POA: Diagnosis not present

## 2018-05-08 DIAGNOSIS — K831 Obstruction of bile duct: Secondary | ICD-10-CM | POA: Diagnosis not present

## 2018-05-08 DIAGNOSIS — I10 Essential (primary) hypertension: Secondary | ICD-10-CM | POA: Diagnosis not present

## 2018-05-08 DIAGNOSIS — K8309 Other cholangitis: Secondary | ICD-10-CM | POA: Diagnosis not present

## 2018-05-11 ENCOUNTER — Other Ambulatory Visit: Payer: Self-pay | Admitting: Cardiology

## 2018-05-15 DIAGNOSIS — H35361 Drusen (degenerative) of macula, right eye: Secondary | ICD-10-CM | POA: Diagnosis not present

## 2018-05-15 DIAGNOSIS — H353132 Nonexudative age-related macular degeneration, bilateral, intermediate dry stage: Secondary | ICD-10-CM | POA: Diagnosis not present

## 2018-05-15 DIAGNOSIS — H43822 Vitreomacular adhesion, left eye: Secondary | ICD-10-CM | POA: Diagnosis not present

## 2018-05-15 DIAGNOSIS — H43821 Vitreomacular adhesion, right eye: Secondary | ICD-10-CM | POA: Diagnosis not present

## 2018-06-03 DIAGNOSIS — I10 Essential (primary) hypertension: Secondary | ICD-10-CM | POA: Diagnosis not present

## 2018-06-03 DIAGNOSIS — K8309 Other cholangitis: Secondary | ICD-10-CM | POA: Diagnosis not present

## 2018-06-03 DIAGNOSIS — K831 Obstruction of bile duct: Secondary | ICD-10-CM | POA: Diagnosis not present

## 2018-06-03 DIAGNOSIS — Z79899 Other long term (current) drug therapy: Secondary | ICD-10-CM | POA: Diagnosis not present

## 2018-06-03 DIAGNOSIS — K4090 Unilateral inguinal hernia, without obstruction or gangrene, not specified as recurrent: Secondary | ICD-10-CM | POA: Diagnosis not present

## 2018-06-03 DIAGNOSIS — R1031 Right lower quadrant pain: Secondary | ICD-10-CM | POA: Diagnosis not present

## 2018-07-01 DIAGNOSIS — K8309 Other cholangitis: Secondary | ICD-10-CM | POA: Diagnosis not present

## 2018-07-01 DIAGNOSIS — K831 Obstruction of bile duct: Secondary | ICD-10-CM | POA: Diagnosis not present

## 2018-07-01 DIAGNOSIS — I10 Essential (primary) hypertension: Secondary | ICD-10-CM | POA: Diagnosis not present

## 2018-07-21 DIAGNOSIS — D649 Anemia, unspecified: Secondary | ICD-10-CM | POA: Diagnosis not present

## 2018-07-21 DIAGNOSIS — E782 Mixed hyperlipidemia: Secondary | ICD-10-CM | POA: Diagnosis not present

## 2018-07-21 DIAGNOSIS — Z1321 Encounter for screening for nutritional disorder: Secondary | ICD-10-CM | POA: Diagnosis not present

## 2018-07-24 DIAGNOSIS — K8031 Calculus of bile duct with cholangitis, unspecified, with obstruction: Secondary | ICD-10-CM | POA: Diagnosis not present

## 2018-07-24 DIAGNOSIS — K219 Gastro-esophageal reflux disease without esophagitis: Secondary | ICD-10-CM | POA: Diagnosis not present

## 2018-07-24 DIAGNOSIS — N400 Enlarged prostate without lower urinary tract symptoms: Secondary | ICD-10-CM | POA: Diagnosis not present

## 2018-07-24 DIAGNOSIS — D649 Anemia, unspecified: Secondary | ICD-10-CM | POA: Diagnosis not present

## 2018-07-24 DIAGNOSIS — D696 Thrombocytopenia, unspecified: Secondary | ICD-10-CM | POA: Diagnosis not present

## 2018-07-24 DIAGNOSIS — E782 Mixed hyperlipidemia: Secondary | ICD-10-CM | POA: Diagnosis not present

## 2018-07-24 DIAGNOSIS — F5101 Primary insomnia: Secondary | ICD-10-CM | POA: Diagnosis not present

## 2018-07-28 DIAGNOSIS — R351 Nocturia: Secondary | ICD-10-CM | POA: Diagnosis not present

## 2018-07-28 DIAGNOSIS — N401 Enlarged prostate with lower urinary tract symptoms: Secondary | ICD-10-CM | POA: Diagnosis not present

## 2018-07-28 DIAGNOSIS — R972 Elevated prostate specific antigen [PSA]: Secondary | ICD-10-CM | POA: Diagnosis not present

## 2018-07-28 DIAGNOSIS — N528 Other male erectile dysfunction: Secondary | ICD-10-CM | POA: Diagnosis not present

## 2018-08-29 DIAGNOSIS — K831 Obstruction of bile duct: Secondary | ICD-10-CM | POA: Diagnosis not present

## 2018-09-05 DIAGNOSIS — K831 Obstruction of bile duct: Secondary | ICD-10-CM | POA: Diagnosis not present

## 2018-09-05 DIAGNOSIS — K8309 Other cholangitis: Secondary | ICD-10-CM | POA: Diagnosis not present

## 2018-09-06 MED FILL — NORMAL SALINE FLUSH SYRINGE: 0.9 | 60 days supply | Qty: 1800 | Fill #0

## 2018-10-06 DIAGNOSIS — K219 Gastro-esophageal reflux disease without esophagitis: Secondary | ICD-10-CM | POA: Diagnosis not present

## 2018-10-06 DIAGNOSIS — D696 Thrombocytopenia, unspecified: Secondary | ICD-10-CM | POA: Diagnosis not present

## 2018-10-06 DIAGNOSIS — E782 Mixed hyperlipidemia: Secondary | ICD-10-CM | POA: Diagnosis not present

## 2018-10-06 DIAGNOSIS — N400 Enlarged prostate without lower urinary tract symptoms: Secondary | ICD-10-CM | POA: Diagnosis not present

## 2018-10-07 DIAGNOSIS — K8309 Other cholangitis: Secondary | ICD-10-CM | POA: Diagnosis not present

## 2018-11-09 ENCOUNTER — Other Ambulatory Visit (INDEPENDENT_AMBULATORY_CARE_PROVIDER_SITE_OTHER): Payer: Self-pay | Admitting: Internal Medicine

## 2018-11-11 ENCOUNTER — Ambulatory Visit (INDEPENDENT_AMBULATORY_CARE_PROVIDER_SITE_OTHER): Payer: Medicare Other | Admitting: Internal Medicine

## 2018-11-20 DIAGNOSIS — K8309 Other cholangitis: Secondary | ICD-10-CM | POA: Diagnosis not present

## 2018-11-20 DIAGNOSIS — K831 Obstruction of bile duct: Secondary | ICD-10-CM | POA: Diagnosis not present

## 2018-11-20 DIAGNOSIS — R509 Fever, unspecified: Secondary | ICD-10-CM | POA: Diagnosis not present

## 2018-12-09 DIAGNOSIS — Z792 Long term (current) use of antibiotics: Secondary | ICD-10-CM | POA: Diagnosis not present

## 2018-12-09 DIAGNOSIS — K8309 Other cholangitis: Secondary | ICD-10-CM | POA: Diagnosis not present

## 2018-12-09 DIAGNOSIS — K831 Obstruction of bile duct: Secondary | ICD-10-CM | POA: Diagnosis not present

## 2018-12-10 DIAGNOSIS — K8309 Other cholangitis: Secondary | ICD-10-CM | POA: Diagnosis not present

## 2019-01-15 DIAGNOSIS — D696 Thrombocytopenia, unspecified: Secondary | ICD-10-CM | POA: Diagnosis not present

## 2019-01-15 DIAGNOSIS — K8031 Calculus of bile duct with cholangitis, unspecified, with obstruction: Secondary | ICD-10-CM | POA: Diagnosis not present

## 2019-01-15 DIAGNOSIS — E782 Mixed hyperlipidemia: Secondary | ICD-10-CM | POA: Diagnosis not present

## 2019-01-15 DIAGNOSIS — F5101 Primary insomnia: Secondary | ICD-10-CM | POA: Diagnosis not present

## 2019-01-15 DIAGNOSIS — D649 Anemia, unspecified: Secondary | ICD-10-CM | POA: Diagnosis not present

## 2019-01-15 DIAGNOSIS — N400 Enlarged prostate without lower urinary tract symptoms: Secondary | ICD-10-CM | POA: Diagnosis not present

## 2019-01-15 DIAGNOSIS — K219 Gastro-esophageal reflux disease without esophagitis: Secondary | ICD-10-CM | POA: Diagnosis not present

## 2019-01-22 DIAGNOSIS — E782 Mixed hyperlipidemia: Secondary | ICD-10-CM | POA: Diagnosis not present

## 2019-01-22 DIAGNOSIS — K219 Gastro-esophageal reflux disease without esophagitis: Secondary | ICD-10-CM | POA: Diagnosis not present

## 2019-01-22 DIAGNOSIS — D649 Anemia, unspecified: Secondary | ICD-10-CM | POA: Diagnosis not present

## 2019-01-22 DIAGNOSIS — K8031 Calculus of bile duct with cholangitis, unspecified, with obstruction: Secondary | ICD-10-CM | POA: Diagnosis not present

## 2019-01-22 DIAGNOSIS — D696 Thrombocytopenia, unspecified: Secondary | ICD-10-CM | POA: Diagnosis not present

## 2019-01-22 DIAGNOSIS — L723 Sebaceous cyst: Secondary | ICD-10-CM | POA: Diagnosis not present

## 2019-01-22 DIAGNOSIS — I480 Paroxysmal atrial fibrillation: Secondary | ICD-10-CM | POA: Diagnosis not present

## 2019-01-22 DIAGNOSIS — I429 Cardiomyopathy, unspecified: Secondary | ICD-10-CM | POA: Diagnosis not present

## 2019-01-22 DIAGNOSIS — N400 Enlarged prostate without lower urinary tract symptoms: Secondary | ICD-10-CM | POA: Diagnosis not present

## 2019-01-22 DIAGNOSIS — I2510 Atherosclerotic heart disease of native coronary artery without angina pectoris: Secondary | ICD-10-CM | POA: Diagnosis not present

## 2019-01-27 DIAGNOSIS — K8309 Other cholangitis: Secondary | ICD-10-CM | POA: Diagnosis not present

## 2019-01-27 DIAGNOSIS — K831 Obstruction of bile duct: Secondary | ICD-10-CM | POA: Diagnosis not present

## 2019-02-09 DIAGNOSIS — R972 Elevated prostate specific antigen [PSA]: Secondary | ICD-10-CM | POA: Diagnosis not present

## 2019-02-09 DIAGNOSIS — N401 Enlarged prostate with lower urinary tract symptoms: Secondary | ICD-10-CM | POA: Diagnosis not present

## 2019-02-09 DIAGNOSIS — N528 Other male erectile dysfunction: Secondary | ICD-10-CM | POA: Diagnosis not present

## 2019-02-11 DIAGNOSIS — K219 Gastro-esophageal reflux disease without esophagitis: Secondary | ICD-10-CM | POA: Diagnosis not present

## 2019-02-11 DIAGNOSIS — I480 Paroxysmal atrial fibrillation: Secondary | ICD-10-CM | POA: Diagnosis not present

## 2019-02-11 DIAGNOSIS — D696 Thrombocytopenia, unspecified: Secondary | ICD-10-CM | POA: Diagnosis not present

## 2019-02-11 DIAGNOSIS — N400 Enlarged prostate without lower urinary tract symptoms: Secondary | ICD-10-CM | POA: Diagnosis not present

## 2019-02-11 DIAGNOSIS — E782 Mixed hyperlipidemia: Secondary | ICD-10-CM | POA: Diagnosis not present

## 2019-02-11 DIAGNOSIS — I429 Cardiomyopathy, unspecified: Secondary | ICD-10-CM | POA: Diagnosis not present

## 2019-02-11 DIAGNOSIS — L723 Sebaceous cyst: Secondary | ICD-10-CM | POA: Diagnosis not present

## 2019-02-11 DIAGNOSIS — I2510 Atherosclerotic heart disease of native coronary artery without angina pectoris: Secondary | ICD-10-CM | POA: Diagnosis not present

## 2019-02-11 DIAGNOSIS — D649 Anemia, unspecified: Secondary | ICD-10-CM | POA: Diagnosis not present

## 2019-02-11 DIAGNOSIS — K8031 Calculus of bile duct with cholangitis, unspecified, with obstruction: Secondary | ICD-10-CM | POA: Diagnosis not present

## 2019-02-17 ENCOUNTER — Other Ambulatory Visit: Payer: Self-pay

## 2019-02-17 ENCOUNTER — Ambulatory Visit (INDEPENDENT_AMBULATORY_CARE_PROVIDER_SITE_OTHER): Payer: Medicare Other | Admitting: Internal Medicine

## 2019-02-17 ENCOUNTER — Encounter (INDEPENDENT_AMBULATORY_CARE_PROVIDER_SITE_OTHER): Payer: Self-pay | Admitting: Internal Medicine

## 2019-02-17 VITALS — BP 114/75 | HR 64 | Temp 97.5°F | Ht 71.0 in | Wt 176.7 lb

## 2019-02-17 DIAGNOSIS — K4090 Unilateral inguinal hernia, without obstruction or gangrene, not specified as recurrent: Secondary | ICD-10-CM | POA: Diagnosis not present

## 2019-02-17 DIAGNOSIS — K8309 Other cholangitis: Secondary | ICD-10-CM | POA: Diagnosis not present

## 2019-02-17 DIAGNOSIS — R630 Anorexia: Secondary | ICD-10-CM | POA: Diagnosis not present

## 2019-02-17 NOTE — Patient Instructions (Addendum)
Weight check in 2 months. Will request copy of recent blood work from Dr. Olivia Canter office. Consider making an appointment with your surgeon at [facility] for inguinal hernia surgery.

## 2019-02-17 NOTE — Progress Notes (Signed)
Presenting complaint;  History of recurrent cholangitis.  Database and subjective:  Patient is 78 year old Caucasian male who has history of recurrent cholangitis secondary to hepaticojejunostomy stricture resulting from remote bile duct injury for which she had hepaticojejunostomy over 25 years ago.  He has undergone multiple dilations at [facility] and now he has internal and external stent.  He was last seen by me in November 2019.  He and his wife has requested that I see him periodically for his GI issues.  He states he has done well since he has been on antibiotic.  He is on chronic antibiotic therapy.  He will take Cipro for 1 month followed by Augmentin and followed by Bactrim for a month.  Last episode of fever was 3 months ago.  About 2 months ago he noted biliary drainage from around the external catheter.  He was advised to connect the catheter to her back.  Drainage eventually stopped. He denies abdominal pain.  He says he does not have good appetite and his taste has changed.  However he is not having any nausea or vomiting.  He had blood work by Dr. [name] and was told everything was normal. He is also worried about right inguinal hernia and wonders when he should have surgery.  He states sometimes it extends into his scrotum and he has to lie down and push it back.  He is always been able to do so.  He denies melena or rectal bleeding. He also complains of nocturia.  He says he wakes up every 1-2 hours.  He he is on 2 medications for his prostate.  He saw Dr. Rosana Hoes who did not have new recommendations.  Current Medications: Outpatient Encounter Medications as of 02/17/2019  Medication Sig  . aspirin EC 81 MG tablet Take 81 mg by mouth every evening.  . ciprofloxacin (CIPRO) 500 MG tablet Take 500 mg by mouth 2 (two) times daily.  . cycloSPORINE (RESTASIS) 0.05 % ophthalmic emulsion Place 1 drop into both eyes 2 (two) times daily.   . finasteride (PROSCAR) 5 MG tablet Take 5 mg by mouth  every morning.   . metoprolol tartrate (LOPRESSOR) 25 MG tablet TAKE 1/2 TABLET BY MOUTH TWICE DAILY  . Multiple Vitamin (MULITIVITAMIN WITH MINERALS) TABS Take 1 tablet by mouth every morning.   . Multiple Vitamins-Minerals (PRESERVISION AREDS) CAPS Take by mouth daily.  . Naproxen Sodium (ALEVE PO) Take by mouth as needed.  . Oxymetazoline HCl (AFRIN NASAL SPRAY NA) Place into the nose as needed.  . pravastatin (PRAVACHOL) 20 MG tablet TAKE 1 TABLET BY MOUTH DAILY  . Probiotic Product (PROBIOTIC DAILY PO) Take 1 tablet by mouth daily.  . tamsulosin (FLOMAX) 0.4 MG CAPS capsule Take 0.4 mg by mouth daily after supper.   . ursodiol (ACTIGALL) 250 MG tablet TAKE 1 TABLET(250 MG) BY MOUTH TWICE DAILY  . [DISCONTINUED] amoxicillin-clavulanate (AUGMENTIN) 875-125 MG tablet Take 1 tablet by mouth 2 (two) times daily.    No facility-administered encounter medications on file as of 02/17/2019.      Objective: Blood pressure 114/75, pulse 64, temperature (!) 97.5 F (36.4 C), temperature source Oral, height 5\' 11"  (1.803 m), weight 176 lb 11.2 oz (80.2 kg). Patient is alert and in no acute distress. Conjunctiva is pink. Sclera is nonicteric Oropharyngeal mucosa is normal. No neck masses or thyromegaly noted. Cardiac exam with regular rhythm normal S1 and S2. No murmur or gallop noted. Lungs are clear to auscultation. Abdomen abdominal exam reveals external biliary  stent with entry sites in epigastric region close to the midline it is capped.  He has right subcostal scar.  He also has right inguinal hernia which is reducible and nontender.  On palpation abdomen is soft and nontender with organomegaly or masses. No LE edema or clubbing noted.  Labs/studies Results: Recent labs requested from Dr. Juel Burrow office.   Assessment:  #1.  History of recurrent cholangitis secondary to hepaticojejunostomy stricture which has been dilated and stented multiple times.  He now has Wallstent but he also has  external stent which is capped and flushed every day.  He is known chronic antibiotic therapy but he is rotating antibiotics every month.  So far so good.  #2.  Anorexia.  Patient advised to increase physical activity as it might improve his appetite.  #3.  Right inguinal hernia.  He is fairly active and he should consider having the surgery electively as he is at risk for obstruction or strangulation.   Plan:  Will request recent blood work from Dr. Olivia Canter office for review. Patient advised to increase physical activity as it would help improve his appetite and also slow down or prevent muscle wasting. Patient will contact his surgeon at [facility] and arrange for right inguinal herniorrhaphy when convenient. Weight check in 2 months. Office visit in 6 months.

## 2019-02-19 DIAGNOSIS — Z85828 Personal history of other malignant neoplasm of skin: Secondary | ICD-10-CM | POA: Diagnosis not present

## 2019-02-19 DIAGNOSIS — L820 Inflamed seborrheic keratosis: Secondary | ICD-10-CM | POA: Diagnosis not present

## 2019-02-19 DIAGNOSIS — D1801 Hemangioma of skin and subcutaneous tissue: Secondary | ICD-10-CM | POA: Diagnosis not present

## 2019-02-19 DIAGNOSIS — L905 Scar conditions and fibrosis of skin: Secondary | ICD-10-CM | POA: Diagnosis not present

## 2019-02-19 DIAGNOSIS — Z23 Encounter for immunization: Secondary | ICD-10-CM | POA: Diagnosis not present

## 2019-02-19 DIAGNOSIS — D225 Melanocytic nevi of trunk: Secondary | ICD-10-CM | POA: Diagnosis not present

## 2019-02-19 DIAGNOSIS — Z87898 Personal history of other specified conditions: Secondary | ICD-10-CM | POA: Diagnosis not present

## 2019-02-19 DIAGNOSIS — L570 Actinic keratosis: Secondary | ICD-10-CM | POA: Diagnosis not present

## 2019-02-19 DIAGNOSIS — L821 Other seborrheic keratosis: Secondary | ICD-10-CM | POA: Diagnosis not present

## 2019-02-19 DIAGNOSIS — D2272 Melanocytic nevi of left lower limb, including hip: Secondary | ICD-10-CM | POA: Diagnosis not present

## 2019-02-19 DIAGNOSIS — D485 Neoplasm of uncertain behavior of skin: Secondary | ICD-10-CM | POA: Diagnosis not present

## 2019-02-19 DIAGNOSIS — Z86018 Personal history of other benign neoplasm: Secondary | ICD-10-CM | POA: Diagnosis not present

## 2019-02-19 DIAGNOSIS — L814 Other melanin hyperpigmentation: Secondary | ICD-10-CM | POA: Diagnosis not present

## 2019-02-27 DIAGNOSIS — Z23 Encounter for immunization: Secondary | ICD-10-CM | POA: Diagnosis not present

## 2019-03-03 DIAGNOSIS — D649 Anemia, unspecified: Secondary | ICD-10-CM | POA: Diagnosis not present

## 2019-03-03 DIAGNOSIS — N400 Enlarged prostate without lower urinary tract symptoms: Secondary | ICD-10-CM | POA: Diagnosis not present

## 2019-03-03 DIAGNOSIS — D696 Thrombocytopenia, unspecified: Secondary | ICD-10-CM | POA: Diagnosis not present

## 2019-03-03 DIAGNOSIS — K8031 Calculus of bile duct with cholangitis, unspecified, with obstruction: Secondary | ICD-10-CM | POA: Diagnosis not present

## 2019-03-03 DIAGNOSIS — L723 Sebaceous cyst: Secondary | ICD-10-CM | POA: Diagnosis not present

## 2019-03-03 DIAGNOSIS — I480 Paroxysmal atrial fibrillation: Secondary | ICD-10-CM | POA: Diagnosis not present

## 2019-03-03 DIAGNOSIS — I429 Cardiomyopathy, unspecified: Secondary | ICD-10-CM | POA: Diagnosis not present

## 2019-03-03 DIAGNOSIS — I2510 Atherosclerotic heart disease of native coronary artery without angina pectoris: Secondary | ICD-10-CM | POA: Diagnosis not present

## 2019-03-03 DIAGNOSIS — K219 Gastro-esophageal reflux disease without esophagitis: Secondary | ICD-10-CM | POA: Diagnosis not present

## 2019-03-03 DIAGNOSIS — E782 Mixed hyperlipidemia: Secondary | ICD-10-CM | POA: Diagnosis not present

## 2019-03-25 DIAGNOSIS — K8309 Other cholangitis: Secondary | ICD-10-CM | POA: Diagnosis not present

## 2019-03-26 DIAGNOSIS — Z961 Presence of intraocular lens: Secondary | ICD-10-CM | POA: Diagnosis not present

## 2019-03-26 DIAGNOSIS — H353132 Nonexudative age-related macular degeneration, bilateral, intermediate dry stage: Secondary | ICD-10-CM | POA: Diagnosis not present

## 2019-03-26 DIAGNOSIS — H26493 Other secondary cataract, bilateral: Secondary | ICD-10-CM | POA: Diagnosis not present

## 2019-03-26 DIAGNOSIS — H04123 Dry eye syndrome of bilateral lacrimal glands: Secondary | ICD-10-CM | POA: Diagnosis not present

## 2019-04-01 DIAGNOSIS — D649 Anemia, unspecified: Secondary | ICD-10-CM | POA: Diagnosis not present

## 2019-04-01 DIAGNOSIS — K219 Gastro-esophageal reflux disease without esophagitis: Secondary | ICD-10-CM | POA: Diagnosis not present

## 2019-04-01 DIAGNOSIS — K8031 Calculus of bile duct with cholangitis, unspecified, with obstruction: Secondary | ICD-10-CM | POA: Diagnosis not present

## 2019-04-01 DIAGNOSIS — E782 Mixed hyperlipidemia: Secondary | ICD-10-CM | POA: Diagnosis not present

## 2019-04-01 DIAGNOSIS — I429 Cardiomyopathy, unspecified: Secondary | ICD-10-CM | POA: Diagnosis not present

## 2019-04-01 DIAGNOSIS — I2510 Atherosclerotic heart disease of native coronary artery without angina pectoris: Secondary | ICD-10-CM | POA: Diagnosis not present

## 2019-04-01 DIAGNOSIS — N400 Enlarged prostate without lower urinary tract symptoms: Secondary | ICD-10-CM | POA: Diagnosis not present

## 2019-04-01 DIAGNOSIS — D696 Thrombocytopenia, unspecified: Secondary | ICD-10-CM | POA: Diagnosis not present

## 2019-04-01 DIAGNOSIS — I480 Paroxysmal atrial fibrillation: Secondary | ICD-10-CM | POA: Diagnosis not present

## 2019-04-01 DIAGNOSIS — L723 Sebaceous cyst: Secondary | ICD-10-CM | POA: Diagnosis not present

## 2019-04-02 DIAGNOSIS — D485 Neoplasm of uncertain behavior of skin: Secondary | ICD-10-CM | POA: Diagnosis not present

## 2019-04-02 DIAGNOSIS — D229 Melanocytic nevi, unspecified: Secondary | ICD-10-CM | POA: Diagnosis not present

## 2019-04-02 DIAGNOSIS — L905 Scar conditions and fibrosis of skin: Secondary | ICD-10-CM | POA: Diagnosis not present

## 2019-04-08 DIAGNOSIS — H26492 Other secondary cataract, left eye: Secondary | ICD-10-CM | POA: Diagnosis not present

## 2019-04-20 ENCOUNTER — Other Ambulatory Visit: Payer: Self-pay

## 2019-04-21 DIAGNOSIS — K8309 Other cholangitis: Secondary | ICD-10-CM | POA: Diagnosis not present

## 2019-04-22 ENCOUNTER — Other Ambulatory Visit: Payer: Self-pay

## 2019-04-22 MED ORDER — METOPROLOL TARTRATE 25 MG PO TABS: 12.5000 mg | ORAL_TABLET | Freq: Two times a day (BID) | ORAL | 3 refills | Status: DC

## 2019-04-22 MED ORDER — PRAVASTATIN SODIUM 20 MG PO TABS: 20.0000 mg | ORAL_TABLET | Freq: Every day | ORAL | 3 refills | Status: DC

## 2019-06-04 DIAGNOSIS — K8031 Calculus of bile duct with cholangitis, unspecified, with obstruction: Secondary | ICD-10-CM | POA: Diagnosis not present

## 2019-06-04 DIAGNOSIS — D696 Thrombocytopenia, unspecified: Secondary | ICD-10-CM | POA: Diagnosis not present

## 2019-06-04 DIAGNOSIS — K219 Gastro-esophageal reflux disease without esophagitis: Secondary | ICD-10-CM | POA: Diagnosis not present

## 2019-06-04 DIAGNOSIS — D649 Anemia, unspecified: Secondary | ICD-10-CM | POA: Diagnosis not present

## 2019-06-04 DIAGNOSIS — L723 Sebaceous cyst: Secondary | ICD-10-CM | POA: Diagnosis not present

## 2019-06-04 DIAGNOSIS — I2510 Atherosclerotic heart disease of native coronary artery without angina pectoris: Secondary | ICD-10-CM | POA: Diagnosis not present

## 2019-06-04 DIAGNOSIS — I480 Paroxysmal atrial fibrillation: Secondary | ICD-10-CM | POA: Diagnosis not present

## 2019-06-04 DIAGNOSIS — I429 Cardiomyopathy, unspecified: Secondary | ICD-10-CM | POA: Diagnosis not present

## 2019-06-04 DIAGNOSIS — E782 Mixed hyperlipidemia: Secondary | ICD-10-CM | POA: Diagnosis not present

## 2019-06-04 DIAGNOSIS — N400 Enlarged prostate without lower urinary tract symptoms: Secondary | ICD-10-CM | POA: Diagnosis not present

## 2019-06-24 ENCOUNTER — Other Ambulatory Visit: Payer: Self-pay

## 2019-06-24 ENCOUNTER — Ambulatory Visit (INDEPENDENT_AMBULATORY_CARE_PROVIDER_SITE_OTHER): Payer: Medicare Other | Admitting: Cardiology

## 2019-06-24 ENCOUNTER — Encounter: Payer: Self-pay | Admitting: Cardiology

## 2019-06-24 VITALS — BP 123/72 | HR 60 | Ht 71.0 in | Wt 182.0 lb

## 2019-06-24 DIAGNOSIS — I480 Paroxysmal atrial fibrillation: Secondary | ICD-10-CM | POA: Diagnosis not present

## 2019-06-24 DIAGNOSIS — I25119 Atherosclerotic heart disease of native coronary artery with unspecified angina pectoris: Secondary | ICD-10-CM | POA: Diagnosis not present

## 2019-06-24 DIAGNOSIS — E782 Mixed hyperlipidemia: Secondary | ICD-10-CM

## 2019-06-24 NOTE — Patient Instructions (Signed)

## 2019-06-24 NOTE — Progress Notes (Signed)
Cardiology Office Note  Date: 06/24/2019   ID: Alexander Burton, Alexander Burton 07/11/1940, MRN [id number]  PCP:  Celene Squibb, MD  Cardiologist:  Rozann Lesches, MD Electrophysiologist:  None   Chief Complaint  Patient presents with  . Cardiac follow-up    History of Present Illness: Alexander Burton is an 79 y.o. male last seen in October 2018.  He is here today with his wife, overdue for follow-up.  In the interim he does not report any chest pain or palpitations.  He has continued to follow with Dr. Nevada Crane for primary care.  He states that he has an inguinal hernia, considering elective repair at [facility] but putting off surgery for the time being during the pandemic.  He has a history of recurrent cholangitis with placement of PBD at [facility], it sounds like this has been stable over the last few years.  I talked with him about anticoagulation for stroke prophylaxis with history of paroxysmal atrial fibrillation.  This had been deferred previously with change in health status.  He has continued on aspirin.  I reviewed his ECG today which shows sinus rhythm with old inferior infarct pattern, low voltage.  CHA2DS2-VASc score is 3-4.  He states that for now he remains comfortable on aspirin.  Past Medical History:  Diagnosis Date  . BPH (benign prostatic hyperplasia)   . CAD (coronary artery disease)    DES to LAD 06/2008  . Colon polyps   . COPD (chronic obstructive pulmonary disease) ([place])   . Gastritis   . Goiter   . 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 History of syncope January 2010   PVC's and14 beats NSVT as well as atrial afib  . Hyperlipidemia   . Melanoma ([place])   . Osteoarthritis   . PAF (paroxysmal atrial fibrillation) ([place])   . Prostate CA ([place])   . Sleep apnea 04/02/2005    Past Surgical History:  Procedure Laterality Date  . CARDIAC CATHETERIZATION  Feb 2010   post DES to LAD ,3.5 X 33 mm Cypher stent,dilated up to 3.8 mm 80 to90% stenosis  btwn first and second diag branc w.moderate disease EF 50%  . CHOLECYSTECTOMY     Complicated by rupture of common bile duct and other vascular structures. Has now had multiple issues with chronic hepatic abscess. Currently has indwelling drain  . COLONOSCOPY  08/16/2011   Procedure: COLONOSCOPY;  Surgeon: Rogene Houston, MD;  [facility];  Service: Endoscopy;  Laterality: N/A;  1200  . ESOPHAGEAL DILATION N/A 01/20/2015   Procedure: ESOPHAGEAL DILATION;  Surgeon: Rogene Houston, MD;  [facility];  Service: Endoscopy;  Laterality: N/A;  . ESOPHAGOGASTRODUODENOSCOPY N/A 01/20/2015   Procedure: ESOPHAGOGASTRODUODENOSCOPY (EGD);  Surgeon: Rogene Houston, MD;  [facility];  Service: Endoscopy;  Laterality: N/A;  1200  . LEA DOPPLER  07/28/2008   NORMAL RGT GROIN DUPLEX DOPPLER  . liver stent    . LIVER SURGERY  09-03/14   @ [facility]  . Melanoma resection      Current Outpatient Medications  Medication Sig Dispense Refill  . aspirin EC 81 MG tablet Take 81 mg by mouth every evening.    . cycloSPORINE (RESTASIS) 0.05 % ophthalmic emulsion Place 1 drop into both eyes 2 (two) times daily.     . finasteride (PROSCAR) 5 MG tablet Take 5 mg by mouth every morning.     . metoprolol tartrate (LOPRESSOR) 25 MG  tablet Take 0.5 tablets (12.5 mg total) by mouth 2 (two) times daily. 90 tablet 3  . Multiple Vitamin (MULITIVITAMIN WITH MINERALS) TABS Take 1 tablet by mouth every morning.     . Multiple Vitamins-Minerals (PRESERVISION AREDS) CAPS Take by mouth daily.    . Naproxen Sodium (ALEVE PO) Take by mouth as needed.    . Oxymetazoline HCl (AFRIN NASAL SPRAY NA) Place into the nose as needed.    . pravastatin (PRAVACHOL) 20 MG tablet Take 1 tablet (20 mg total) by mouth daily. 90 tablet 3  . Probiotic Product (PROBIOTIC DAILY PO) Take 1 tablet by mouth daily.    . Sulfamethoxazole-Trimethoprim (SULFAMETHOXAZOLE-TMP DS PO) Take by mouth.    . tamsulosin (FLOMAX) 0.4 MG  CAPS capsule Take 0.4 mg by mouth daily after supper.   3  . ursodiol (ACTIGALL) 250 MG tablet TAKE 1 TABLET(250 MG) BY MOUTH TWICE DAILY 60 tablet 11   No current facility-administered medications for this visit.   Allergies:  Contrast media [iodinated diagnostic agents] and Other   Social History: The patient  reports that he quit smoking about 12 years ago. His smoking use included cigarettes. He has a 75.00 pack-year smoking history. He has never used smokeless tobacco. He reports current alcohol use of about 1.0 standard drinks of alcohol per week. He reports that he does not use drugs.   ROS:  Please see the history of present illness. Otherwise, complete review of systems is positive for none.  All other systems are reviewed and negative.   Physical Exam: VS:  BP 123/72   Pulse 60   Ht 5\' 11"  (1.803 m)   Wt 182 lb (82.6 kg)   SpO2 98%   BMI 25.38 kg/m , BMI Body mass index is 25.38 kg/m.  Wt Readings from Last 3 Encounters:  06/24/19 182 lb (82.6 kg)  02/17/19 176 lb 11.2 oz (80.2 kg)  04/15/18 179 lb 9.6 oz (81.5 kg)    General: Elderly male, appears comfortable at rest. HEENT: Conjunctiva and lids normal, wearing a mask. Neck: Supple, no elevated JVP or carotid bruits, no thyromegaly. Lungs: Clear to auscultation, nonlabored breathing at rest. Cardiac: Regular rate and rhythm, no S3, soft systolic murmur. Abdomen: Soft, nontender, bowel sounds present. Extremities: No pitting edema, distal pulses 2+. Skin: Warm and dry. Musculoskeletal: No kyphosis. Neuropsychiatric: Alert and oriented x3, affect grossly appropriate.  ECG:  An ECG dated 01/18/2017 was personally reviewed today and demonstrated:  Sinus bradycardia with low voltage, nonspecific ST-T changes, PVC.  Recent Labwork:  August 2020: Hemoglobin 13.3, platelets aggregated, BUN 19, creatinine 1.17, potassium 4.8, AST 30, ALT 26 135, triglycerides 103, HDL 49, LDL 75  Other Studies Reviewed  Today:  Echocardiogram 02/22/2017: Study Conclusions  - Left ventricle: The cavity size was normal. Wall thickness was increased in a pattern of mild LVH. Systolic function was normal. The estimated ejection fraction was in the range of 55% to 60%. Wall motion was normal; there were no regional wall motion abnormalities. Doppler parameters are consistent with abnormal left ventricular relaxation (grade 1 diastolic dysfunction). - Aortic valve: Valve area (VTI): 2.91 cm^2. Valve area (Vmax): 2.25 cm^2. - Left atrium: The atrium was mildly to moderately dilated. - Technically adequate study.  Assessment and Plan:  1.  History of paroxysmal atrial fibrillation with CHA2DS2-VASc score of 3-4.  He does not report any palpitations and is in sinus rhythm today.  We have talked about DOAC instead of aspirin, at this point he remains  comfortable without change.  We will continue with observation on beta-blocker.  2.  CAD status post DES to the LAD in 2010.  He does not report any active angina at this time.  Continue medical therapy and observation.  He is on aspirin, Lopressor, and Pravachol.  ECG reviewed.  3.  Secondary cardiomyopathy with normalization of LVEF.  Medication Adjustments/Labs and Tests Ordered: Current medicines are reviewed at length with the patient today.  Concerns regarding medicines are outlined above.   Tests Ordered: Orders Placed This Encounter  Procedures  . EKG 12-Lead    Medication Changes: No orders of the defined types were placed in this encounter.   Disposition:  Follow up 6 months in the [place] office.  Signed, Satira Sark, MD, [facility] 06/24/2019 3:15 PM    [place] [facility] [address] [place], [place] [address] Phone: [phone]; Fax: [phone]

## 2019-07-18 ENCOUNTER — Other Ambulatory Visit: Payer: Self-pay | Admitting: Cardiology

## 2019-07-21 DIAGNOSIS — R112 Nausea with vomiting, unspecified: Secondary | ICD-10-CM | POA: Diagnosis not present

## 2019-07-21 DIAGNOSIS — I480 Paroxysmal atrial fibrillation: Secondary | ICD-10-CM | POA: Diagnosis not present

## 2019-07-21 DIAGNOSIS — F5101 Primary insomnia: Secondary | ICD-10-CM | POA: Diagnosis not present

## 2019-07-21 DIAGNOSIS — R109 Unspecified abdominal pain: Secondary | ICD-10-CM | POA: Diagnosis not present

## 2019-07-21 DIAGNOSIS — K219 Gastro-esophageal reflux disease without esophagitis: Secondary | ICD-10-CM | POA: Diagnosis not present

## 2019-07-21 DIAGNOSIS — I10 Essential (primary) hypertension: Secondary | ICD-10-CM | POA: Diagnosis not present

## 2019-07-21 DIAGNOSIS — E782 Mixed hyperlipidemia: Secondary | ICD-10-CM | POA: Diagnosis not present

## 2019-07-21 DIAGNOSIS — K8031 Calculus of bile duct with cholangitis, unspecified, with obstruction: Secondary | ICD-10-CM | POA: Diagnosis not present

## 2019-07-21 DIAGNOSIS — D649 Anemia, unspecified: Secondary | ICD-10-CM | POA: Diagnosis not present

## 2019-07-21 DIAGNOSIS — K8051 Calculus of bile duct without cholangitis or cholecystitis with obstruction: Secondary | ICD-10-CM | POA: Diagnosis not present

## 2019-07-21 DIAGNOSIS — R82998 Other abnormal findings in urine: Secondary | ICD-10-CM | POA: Diagnosis not present

## 2019-07-21 DIAGNOSIS — L723 Sebaceous cyst: Secondary | ICD-10-CM | POA: Diagnosis not present

## 2019-07-23 DIAGNOSIS — E782 Mixed hyperlipidemia: Secondary | ICD-10-CM | POA: Diagnosis not present

## 2019-07-23 DIAGNOSIS — N400 Enlarged prostate without lower urinary tract symptoms: Secondary | ICD-10-CM | POA: Diagnosis not present

## 2019-07-23 DIAGNOSIS — I2510 Atherosclerotic heart disease of native coronary artery without angina pectoris: Secondary | ICD-10-CM | POA: Diagnosis not present

## 2019-07-23 DIAGNOSIS — K8031 Calculus of bile duct with cholangitis, unspecified, with obstruction: Secondary | ICD-10-CM | POA: Diagnosis not present

## 2019-07-23 DIAGNOSIS — K4091 Unilateral inguinal hernia, without obstruction or gangrene, recurrent: Secondary | ICD-10-CM | POA: Diagnosis not present

## 2019-07-23 DIAGNOSIS — D649 Anemia, unspecified: Secondary | ICD-10-CM | POA: Diagnosis not present

## 2019-07-23 DIAGNOSIS — R770 Abnormality of albumin: Secondary | ICD-10-CM | POA: Diagnosis not present

## 2019-07-23 DIAGNOSIS — I480 Paroxysmal atrial fibrillation: Secondary | ICD-10-CM | POA: Diagnosis not present

## 2019-07-23 DIAGNOSIS — D696 Thrombocytopenia, unspecified: Secondary | ICD-10-CM | POA: Diagnosis not present

## 2019-07-23 DIAGNOSIS — K219 Gastro-esophageal reflux disease without esophagitis: Secondary | ICD-10-CM | POA: Diagnosis not present

## 2019-07-23 DIAGNOSIS — I429 Cardiomyopathy, unspecified: Secondary | ICD-10-CM | POA: Diagnosis not present

## 2019-07-23 DIAGNOSIS — L723 Sebaceous cyst: Secondary | ICD-10-CM | POA: Diagnosis not present

## 2019-08-03 ENCOUNTER — Observation Stay (HOSPITAL_COMMUNITY)
Admission: EM | Admit: 2019-08-03 | Discharge: 2019-08-04 | Disposition: A | Discharge status: Home / Self Care | Payer: Medicare Other | Attending: Internal Medicine | Admitting: Internal Medicine

## 2019-08-03 ENCOUNTER — Other Ambulatory Visit: Payer: Self-pay

## 2019-08-03 ENCOUNTER — Emergency Department (HOSPITAL_COMMUNITY): Payer: Medicare Other

## 2019-08-03 ENCOUNTER — Encounter (HOSPITAL_COMMUNITY): Payer: Self-pay | Admitting: Emergency Medicine

## 2019-08-03 DIAGNOSIS — N400 Enlarged prostate without lower urinary tract symptoms: Secondary | ICD-10-CM | POA: Diagnosis not present

## 2019-08-03 DIAGNOSIS — Z955 Presence of coronary angioplasty implant and graft: Secondary | ICD-10-CM | POA: Diagnosis not present

## 2019-08-03 DIAGNOSIS — R630 Anorexia: Secondary | ICD-10-CM | POA: Diagnosis not present

## 2019-08-03 DIAGNOSIS — R509 Fever, unspecified: Secondary | ICD-10-CM | POA: Diagnosis not present

## 2019-08-03 DIAGNOSIS — J449 Chronic obstructive pulmonary disease, unspecified: Secondary | ICD-10-CM | POA: Insufficient documentation

## 2019-08-03 DIAGNOSIS — Z87891 Personal history of nicotine dependence: Secondary | ICD-10-CM | POA: Diagnosis not present

## 2019-08-03 DIAGNOSIS — R748 Abnormal levels of other serum enzymes: Secondary | ICD-10-CM | POA: Diagnosis not present

## 2019-08-03 DIAGNOSIS — I10 Essential (primary) hypertension: Secondary | ICD-10-CM

## 2019-08-03 DIAGNOSIS — K8309 Other cholangitis: Principal | ICD-10-CM | POA: Insufficient documentation

## 2019-08-03 DIAGNOSIS — R531 Weakness: Secondary | ICD-10-CM | POA: Diagnosis not present

## 2019-08-03 DIAGNOSIS — I2510 Atherosclerotic heart disease of native coronary artery without angina pectoris: Secondary | ICD-10-CM | POA: Insufficient documentation

## 2019-08-03 DIAGNOSIS — Z20822 Contact with and (suspected) exposure to covid-19: Secondary | ICD-10-CM | POA: Diagnosis not present

## 2019-08-03 DIAGNOSIS — R7881 Bacteremia: Secondary | ICD-10-CM | POA: Diagnosis present

## 2019-08-03 DIAGNOSIS — E785 Hyperlipidemia, unspecified: Secondary | ICD-10-CM | POA: Diagnosis present

## 2019-08-03 DIAGNOSIS — Z79899 Other long term (current) drug therapy: Secondary | ICD-10-CM | POA: Diagnosis not present

## 2019-08-03 DIAGNOSIS — I480 Paroxysmal atrial fibrillation: Secondary | ICD-10-CM | POA: Insufficient documentation

## 2019-08-03 DIAGNOSIS — D696 Thrombocytopenia, unspecified: Secondary | ICD-10-CM | POA: Diagnosis present

## 2019-08-03 LAB — CBC WITH DIFFERENTIAL/PLATELET
Abs Immature Granulocytes: 0.1 10*3/uL — ABNORMAL HIGH (ref 0.00–0.07)
Basophils Absolute: 0 10*3/uL (ref 0.0–0.1)
Basophils Relative: 0 %
Eosinophils Absolute: 0 10*3/uL (ref 0.0–0.5)
Eosinophils Relative: 0 %
HCT: 41.2 % (ref 39.0–52.0)
Hemoglobin: 13.2 g/dL (ref 13.0–17.0)
Immature Granulocytes: 1 %
Lymphocytes Relative: 2 %
Lymphs Abs: 0.3 10*3/uL — ABNORMAL LOW (ref 0.7–4.0)
MCH: 29.4 pg (ref 26.0–34.0)
MCHC: 32 g/dL (ref 30.0–36.0)
MCV: 91.8 fL (ref 80.0–100.0)
Monocytes Absolute: 0.6 10*3/uL (ref 0.1–1.0)
Monocytes Relative: 5 %
Neutro Abs: 12 10*3/uL — ABNORMAL HIGH (ref 1.7–7.7)
Neutrophils Relative %: 92 %
Platelets: 82 10*3/uL — ABNORMAL LOW (ref 150–400)
RBC: 4.49 MIL/uL (ref 4.22–5.81)
RDW: 14.9 % (ref 11.5–15.5)
WBC: 13 10*3/uL — ABNORMAL HIGH (ref 4.0–10.5)
nRBC: 0 % (ref 0.0–0.2)

## 2019-08-03 LAB — COMPREHENSIVE METABOLIC PANEL
ALT: 57 U/L — ABNORMAL HIGH (ref 0–44)
AST: 60 U/L — ABNORMAL HIGH (ref 15–41)
Albumin: 3.3 g/dL — ABNORMAL LOW (ref 3.5–5.0)
Alkaline Phosphatase: 212 U/L — ABNORMAL HIGH (ref 38–126)
Anion gap: 10 (ref 5–15)
BUN: 18 mg/dL (ref 8–23)
CO2: 25 mmol/L (ref 22–32)
Calcium: 8.4 mg/dL — ABNORMAL LOW (ref 8.9–10.3)
Chloride: 102 mmol/L (ref 98–111)
Creatinine, Ser: 1.29 mg/dL — ABNORMAL HIGH (ref 0.61–1.24)
GFR calc Af Amer: >60 mL/min (ref >60)
GFR calc non Af Amer: 52 mL/min — ABNORMAL LOW (ref >60)
Glucose, Bld: 159 mg/dL — ABNORMAL HIGH (ref 70–99)
Potassium: 4.4 mmol/L (ref 3.5–5.1)
Sodium: 137 mmol/L (ref 135–145)
Total Bilirubin: 2.6 mg/dL — ABNORMAL HIGH (ref 0.3–1.2)
Total Protein: 6.7 g/dL (ref 6.5–8.1)

## 2019-08-03 LAB — URINALYSIS, ROUTINE W REFLEX MICROSCOPIC
Bacteria, UA: NONE SEEN
Bilirubin Urine: NEGATIVE
Glucose, UA: NEGATIVE mg/dL
Hgb urine dipstick: NEGATIVE
Ketones, ur: NEGATIVE mg/dL
Leukocytes,Ua: NEGATIVE
Nitrite: NEGATIVE
Protein, ur: 30 mg/dL — AB
Specific Gravity, Urine: 1.023 (ref 1.005–1.030)
pH: 6 (ref 5.0–8.0)

## 2019-08-03 LAB — SARS CORONAVIRUS 2 (TAT 6-24 HRS): SARS Coronavirus 2: NEGATIVE

## 2019-08-03 LAB — LIPASE, BLOOD: Lipase: 26 U/L (ref 11–51)

## 2019-08-03 LAB — LACTIC ACID, PLASMA
Lactic Acid, Venous: 2.5 mmol/L (ref 0.5–1.9)
Lactic Acid, Venous: 1.3 mmol/L (ref 0.5–1.9)

## 2019-08-03 MED ORDER — ACETAMINOPHEN 650 MG RE SUPP: 650.0000 mg | Freq: Four times a day (QID) | RECTAL | Status: DC | PRN

## 2019-08-03 MED ORDER — ACETAMINOPHEN 325 MG PO TABS: 650.0000 mg | ORAL_TABLET | Freq: Four times a day (QID) | ORAL | Status: DC | PRN

## 2019-08-03 MED ORDER — HEPARIN SODIUM (PORCINE) 5000 UNIT/ML IJ SOLN
5000.0000 [IU] | Freq: Three times a day (TID) | INTRAMUSCULAR | Status: DC
  Administered 2019-08-03: 21:00:00 5000 [IU] via SUBCUTANEOUS
  Filled 2019-08-03 (×2): qty 1

## 2019-08-03 MED ORDER — CYCLOSPORINE 0.05 % OP EMUL
1.0000 [drp] | Freq: Two times a day (BID) | OPHTHALMIC | Status: DC
  Administered 2019-08-03: 21:00:00 1 [drp] via OPHTHALMIC
  Filled 2019-08-03: qty 1

## 2019-08-03 MED ORDER — NON FORMULARY: 6.0000 mg | Freq: Every evening | Status: DC | PRN

## 2019-08-03 MED ORDER — PRAVASTATIN SODIUM 10 MG PO TABS: 20.0000 mg | ORAL_TABLET | Freq: Every day | ORAL | Status: DC

## 2019-08-03 MED ORDER — SODIUM CHLORIDE 0.9 % IV BOLUS
1000.0000 mL | Freq: Once | INTRAVENOUS | Status: AC
  Administered 2019-08-03: 12:00:00 1000 mL via INTRAVENOUS

## 2019-08-03 MED ORDER — URSODIOL 300 MG PO CAPS
300.0000 mg | ORAL_CAPSULE | Freq: Two times a day (BID) | ORAL | Status: DC
  Administered 2019-08-03 – 2019-08-04 (×2): 300 mg via ORAL
  Filled 2019-08-03 (×4): qty 1

## 2019-08-03 MED ORDER — PREDNISONE 20 MG PO TABS
50.0000 mg | ORAL_TABLET | Freq: Once | ORAL | Status: AC
  Administered 2019-08-04: 50 mg via ORAL
  Filled 2019-08-03: qty 1

## 2019-08-03 MED ORDER — PIPERACILLIN-TAZOBACTAM 3.375 G IVPB
3.3750 g | Freq: Three times a day (TID) | INTRAVENOUS | Status: DC
  Administered 2019-08-03 – 2019-08-04 (×2): 3.375 g via INTRAVENOUS
  Filled 2019-08-03 (×2): qty 50

## 2019-08-03 MED ORDER — LACTATED RINGERS IV SOLN: INTRAVENOUS | Status: DC

## 2019-08-03 MED ORDER — SODIUM CHLORIDE 0.9 % IV BOLUS
1000.0000 mL | Freq: Once | INTRAVENOUS | Status: AC
  Administered 2019-08-03: 14:00:00 1000 mL via INTRAVENOUS

## 2019-08-03 MED ORDER — MELATONIN 3 MG PO TABS
6.0000 mg | ORAL_TABLET | Freq: Every evening | ORAL | Status: DC | PRN
  Administered 2019-08-03: 21:00:00 6 mg via ORAL
  Filled 2019-08-03: qty 2

## 2019-08-03 MED ORDER — ONDANSETRON HCL 4 MG PO TABS: 4.0000 mg | ORAL_TABLET | Freq: Four times a day (QID) | ORAL | Status: DC | PRN

## 2019-08-03 MED ORDER — PREDNISONE 20 MG PO TABS
50.0000 mg | ORAL_TABLET | Freq: Once | ORAL | Status: AC
  Administered 2019-08-04: 06:00:00 50 mg via ORAL
  Filled 2019-08-03: qty 1

## 2019-08-03 MED ORDER — ONDANSETRON HCL 4 MG/2ML IJ SOLN: 4.0000 mg | Freq: Four times a day (QID) | INTRAMUSCULAR | Status: DC | PRN

## 2019-08-03 MED ORDER — METOPROLOL TARTRATE 25 MG PO TABS
12.5000 mg | ORAL_TABLET | Freq: Two times a day (BID) | ORAL | Status: DC
  Administered 2019-08-03 – 2019-08-04 (×2): 12.5 mg via ORAL
  Filled 2019-08-03 (×2): qty 1

## 2019-08-03 MED ORDER — TAMSULOSIN HCL 0.4 MG PO CAPS
0.4000 mg | ORAL_CAPSULE | Freq: Two times a day (BID) | ORAL | Status: DC
  Administered 2019-08-03 – 2019-08-04 (×2): 0.4 mg via ORAL
  Filled 2019-08-03 (×2): qty 1

## 2019-08-03 MED ORDER — URSODIOL 300 MG PO CAPS
ORAL_CAPSULE | ORAL | Status: AC
  Filled 2019-08-03: qty 1

## 2019-08-03 MED ORDER — PIPERACILLIN-TAZOBACTAM 3.375 G IVPB 30 MIN
3.3750 g | Freq: Once | INTRAVENOUS | Status: AC
  Administered 2019-08-03: 13:00:00 3.375 g via INTRAVENOUS
  Filled 2019-08-03: qty 50

## 2019-08-03 MED ORDER — FINASTERIDE 5 MG PO TABS
5.0000 mg | ORAL_TABLET | Freq: Every morning | ORAL | Status: DC
  Administered 2019-08-04: 09:00:00 5 mg via ORAL
  Filled 2019-08-03: qty 1

## 2019-08-03 NOTE — Progress Notes (Signed)
Tele stated 3 beat run of SVT. Pt resting, asymptomatic. Vitals wnl (see flowsheet)

## 2019-08-03 NOTE — H&P (Signed)
History and Physical    [name] [id number] DOB: 13-Jan-1941 DOA: 08/03/2019  PCP: Celene Squibb, MD  Patient coming from: home  I have personally briefly reviewed patient's old medical records in [place]  Chief Complaint: fever, weakness, vomiting  HPI: Alexander Burton is a 79 y.o. 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  Patient has been followed at [facility] and now has internal and external stent.  Presents to the emergency room today with fever, generalized weakness/fatigue since this past Wednesday.  Since yesterday, he has developed vomiting.  He has not had any diarrhea.  He does not have any abdominal pain.  No shortness of breath, cough.  He had hoped to have his external stent to drain and noticed only minimal output.  He came to the ER for evaluation  ED Course: On arrival to the emergency room was noted to be afebrile, hemodynamically stable.  Blood pressure is mildly low.  Lactic acid elevated at 2.5 which has since resolved to normal.  WBC count mildly elevated at 13.  Transaminases are mildly elevated with AST of 60, ALT of 57.  Bilirubin of 2.6.  GI was contacted by EDP who discussed his care with his team at [facility].  Plan will be to admit the patient overnight for IV fluids and antibiotics.  He will go to [facility] in the morning and have his stent exchanged.  Review of Systems: As per HPI otherwise 10 point review of systems negative.    Past Medical History:  Diagnosis Date  . BPH (benign prostatic hyperplasia)   . CAD (coronary artery disease)    DES to LAD 06/2008  . Colon polyps   . COPD (chronic obstructive pulmonary disease) ([place])   . Gastritis   . Goiter   . 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 History of syncope January 2010   PVC's  and14 beats NSVT as well as atrial afib  . Hyperlipidemia   . Melanoma ([place])   . Osteoarthritis   . PAF (paroxysmal atrial fibrillation) ([place])   . Prostate CA ([place])   . Sleep apnea 04/02/2005    Past Surgical History:  Procedure Laterality Date  . CARDIAC CATHETERIZATION  Feb 2010   post DES to LAD ,3.5 X 33 mm Cypher stent,dilated up to 3.8 mm 80 to90% stenosis btwn first and second diag branc w.moderate disease EF 50%  . CHOLECYSTECTOMY     Complicated by rupture of common bile duct and other vascular structures. Has now had multiple issues with chronic hepatic abscess. Currently has indwelling drain  . COLONOSCOPY  08/16/2011   Procedure: COLONOSCOPY;  Surgeon: Rogene Houston, MD;  [facility];  Service: Endoscopy;  Laterality: N/A;  1200  . ESOPHAGEAL DILATION N/A 01/20/2015   Procedure: ESOPHAGEAL DILATION;  Surgeon: Rogene Houston, MD;  [facility];  Service: Endoscopy;  Laterality: N/A;  . ESOPHAGOGASTRODUODENOSCOPY N/A 01/20/2015   Procedure: ESOPHAGOGASTRODUODENOSCOPY (EGD);  Surgeon: Rogene Houston, MD;  [facility];  Service: Endoscopy;  Laterality: N/A;  1200  . LEA DOPPLER  07/28/2008   NORMAL RGT GROIN DUPLEX DOPPLER  . liver stent    . LIVER SURGERY  09-03/14   @ [facility]  . Melanoma resection      Social History:  reports that he quit  smoking about 12 years ago. His smoking use included cigarettes. He has a 75.00 pack-year smoking history. He has never used smokeless tobacco. He reports current alcohol use of about 1.0 standard drinks of alcohol per week. He reports that he does not use drugs.  Allergies  Allergen Reactions  . Contrast Media [Iodinated Diagnostic Agents]     Hives, needs pre meds  . Metrizamide Hives    Hives, needs pre meds  . Other Itching and Other (See Comments)    Contrast dye erythema    Family History  Problem Relation Age of Onset  . Colon cancer Sister      Prior to Admission medications     Medication Sig Start Date End Date Taking? Authorizing Provider  amoxicillin-clavulanate (AUGMENTIN) 875-125 MG tablet Take 1 tablet by mouth as directed. Patient will take 1 tablet twice a day for 1 month; current regimen is Bactrim - will start Augmentin in May 07/14/19  Yes [provider]  aspirin EC 81 MG tablet Take 81 mg by mouth every evening.   Yes [provider]  ciprofloxacin (CIPRO) 500 MG tablet Take 1 tablet by mouth as directed. Patient will take 1 tablet twice a day for 1 month; current regimen is Bactrim - will start Cipro in April 07/14/19  Yes [provider]  cycloSPORINE (RESTASIS) 0.05 % ophthalmic emulsion Place 1 drop into both eyes 2 (two) times daily.    Yes [provider]  finasteride (PROSCAR) 5 MG tablet Take 5 mg by mouth every morning.    Yes [provider]  metoprolol tartrate (LOPRESSOR) 25 MG tablet TAKE 1/2 TABLET(12.5 MG) BY MOUTH TWICE DAILY Patient taking differently: Take 12.5 mg by mouth 2 (two) times daily.  07/20/19  Yes Satira Sark, MD  Multiple Vitamin (MULITIVITAMIN WITH MINERALS) TABS Take 1 tablet by mouth every morning.    Yes [provider]  Multiple Vitamins-Minerals (PRESERVISION AREDS) CAPS Take 1 capsule by mouth in the morning and at bedtime.    Yes [provider]  naproxen sodium (ALEVE) 220 MG tablet Take 220 mg by mouth 2 (two) times daily as needed.    Yes [provider]  nitroGLYCERIN (NITROSTAT) 0.4 MG SL tablet Place 0.4 mg under the tongue every 5 (five) minutes as needed for chest pain.   Yes [provider]  Oxymetazoline HCl (AFRIN NASAL SPRAY NA) Place 2 sprays into the nose 2 (two) times daily as needed.    Yes [provider]  pravastatin (PRAVACHOL) 20 MG tablet TAKE 1 TABLET(20 MG) BY MOUTH DAILY Patient taking differently: Take 20 mg by mouth daily.  07/20/19  Yes Satira Sark, MD  Probiotic Product (PROBIOTIC DAILY PO) Take 1  tablet by mouth daily.   Yes [provider]  sildenafil (REVATIO) 20 MG tablet Take 1-5 tablets by mouth daily as needed. 07/28/18  Yes [provider]  sodium chloride flush (NS) 0.9 % SOLN Place 10 mLs into feeding tube every 8 (eight) hours.   Yes [provider]  sulfamethoxazole-trimethoprim (BACTRIM DS) 800-160 MG tablet Take 1 tablet by mouth 2 (two) times daily. 05/27/19  Yes [provider]  tamsulosin (FLOMAX) 0.4 MG CAPS capsule Take 0.4 mg by mouth in the morning and at bedtime.  06/24/15  Yes [provider]  ursodiol (ACTIGALL) 250 MG tablet TAKE 1 TABLET(250 MG) BY MOUTH TWICE DAILY Patient taking differently: Take 250 mg by mouth in the morning and at bedtime.  11/10/18  Yes Rogene Houston, MD    Physical Exam: Vitals:   08/03/19 1500 08/03/19 1515 08/03/19 1530 08/03/19 1600  BP: (!) 143/94  (!) 108/53 (!) 113/58  Pulse: 66 66 69 61  Resp: 20 20 (!) 24 (!) 21  Temp:      TempSrc:      SpO2: 100% 100% 97% 96%  Weight:      Height:        Constitutional: NAD, calm, comfortable Eyes: PERRL, lids and conjunctivae normal ENMT: Mucous membranes are moist. Posterior pharynx clear of any exudate or lesions.Normal dentition.  Neck: normal, supple, no masses, no thyromegaly Respiratory: clear to auscultation bilaterally, no wheezing, no crackles. Normal respiratory effort. No accessory muscle use.  Cardiovascular: Regular rate and rhythm, no murmurs / rubs / gallops. No extremity edema. 2+ pedal pulses. No carotid bruits.  Abdomen: no tenderness, no masses palpated. No hepatosplenomegaly. Bowel sounds positive. Drain in epigastrium with dark colored fluid in tubing Musculoskeletal: no clubbing / cyanosis. No joint deformity upper and lower extremities. Good ROM, no contractures. Normal muscle tone.  Skin: no rashes, lesions, ulcers. No induration Neurologic: CN 2-12 grossly intact. Sensation intact, DTR normal. Strength 5/5 in all 4.    Psychiatric: Normal judgment and insight. Alert and oriented x 3. Normal mood.    Labs on Admission: I have personally reviewed following labs and imaging studies  CBC: Recent Labs  Lab 08/03/19 1141  WBC 13.0*  NEUTROABS 12.0*  HGB 13.2  HCT 41.2  MCV 91.8  PLT 82*   Basic Metabolic Panel: Recent Labs  Lab 08/03/19 1141  NA 137  K 4.4  CL 102  CO2 25  GLUCOSE 159*  BUN 18  CREATININE 1.29*  CALCIUM 8.4*   GFR: Estimated Creatinine Clearance: 48.6 mL/min (A) (by C-G formula based on SCr of 1.29 mg/dL (H)). Liver Function Tests: Recent Labs  Lab 08/03/19 1141  AST 60*  ALT 57*  ALKPHOS 212*  BILITOT 2.6*  PROT 6.7  ALBUMIN 3.3*   Recent Labs  Lab 08/03/19 1141  LIPASE 26   No results for input(s): AMMONIA in the last 168 hours. Coagulation Profile: No results for input(s): INR, PROTIME in the last 168 hours. Cardiac Enzymes: No results for input(s): CKTOTAL, CKMB, CKMBINDEX, TROPONINI in the last 168 hours. BNP (last 3 results) No results for input(s): PROBNP in the last 8760 hours. HbA1C: No results for input(s): HGBA1C in the last 72 hours. CBG: No results for input(s): GLUCAP in the last 168 hours. Lipid Profile: No results for input(s): CHOL, HDL, LDLCALC, TRIG, CHOLHDL, LDLDIRECT in the last 72 hours. Thyroid Function Tests: No results for input(s): TSH, T4TOTAL, FREET4, T3FREE, THYROIDAB in the last 72 hours. Anemia Panel: No results for input(s): VITAMINB12, FOLATE, FERRITIN, TIBC, IRON, RETICCTPCT in the last 72 hours. Urine analysis:    Component Value Date/Time   COLORURINE YELLOW 08/03/2019 1138   APPEARANCEUR CLEAR 08/03/2019 1138   LABSPEC 1.023 08/03/2019 1138   PHURINE 6.0 08/03/2019 1138   GLUCOSEU NEGATIVE 08/03/2019 1138   HGBUR NEGATIVE 08/03/2019 1138   [place] 08/03/2019 1138   [place] 08/03/2019 1138   PROTEINUR 30 (A) 08/03/2019 1138   UROBILINOGEN 1.0 03/23/2014 1901   NITRITE NEGATIVE  08/03/2019 1138   LEUKOCYTESUR NEGATIVE 08/03/2019 1138    Radiological Exams on Admission: DG Abd Acute W/Chest  Result Date: 08/03/2019 CLINICAL DATA:  Weakness decreased appetite and fever reported history of stents in the liver. EXAM: DG ABDOMEN ACUTE W/  1V CHEST COMPARISON:  03/23/2014 FINDINGS: Leads project over the anterior chest. Cardiomediastinal contours are enlarged and accentuated by portable technique. Lungs are clear. No signs of free air beneath either right or left hemidiaphragm. Multiple surgical clips are present in the right upper quadrant. There is a biliary drain in place passing from the midline towards the right upper quadrant. This drain passes through a stent likely at the porta hepatis. No comparison studies for confirmation such as CT are available. Postoperative changes related to bowel are noted in the right lower quadrant. There are no dilated loops of bowel to suggest bowel obstruction. Density projecting over the left lateral aspect of L5 and over the left and right iliac and sacrum. Also did dense sclerosis over the lower sacrum and left acetabulum as well as left proximal femur. These areas were present on the previous exam. Stool and gas seen in the distal colon. Small amount of gas likely present in the rectum. IMPRESSION: 1. No evidence of bowel obstruction or free air. 2. Signs of biliary stenting and percutaneous biliary drainage catheter. Postoperative changes in the right upper quadrant compatible with partial, right, hepatectomy as before. Based on comparison with 2015 examination the distal portion of the catheter is likely at the level of the biliary-enteric anastomosis. Correlate with bilirubin and catheter function. 3. Signs of sclerosis of areas in the lower lumbar spine and pelvis as described. These are unchanged since 2015. Electronically Signed   By: Zetta Bills M.D.   On: 08/03/2019 13:35    EKG: Independently reviewed. Sinus rhythm without acute  changes  Assessment/Plan Active Problems:   Essential hypertension   Prostate enlargement   Elevated liver enzymes   Hyperlipidemia with target LDL less than 70   Thrombocytopenia (HCC)   Cholangitis     1. Recurrent cholangitis.  Discussed with Dr. Melony Overly and will start the patient on Zosyn.  He will be continued on IV fluids overnight.  He will go to [facility] in the morning to have stent exchange.  Since he has a contrast dye allergy, he will be premedicated with prednisone which will start at midnight.  We will keep the patient n.p.o. after midnight. 2. Hypertension.  Continue on metoprolol 3. Thrombocytopenia.  Chronic.  Likely related to acute infective process 4. Hyperlipidemia.  Hold statin for now in light of elevated LFTs. 5. Prostate enlargement.  Continue on tamsulosin.  DVT prophylaxis: heparin   Code Status: full code  Family Communication: discussed with wife at the bedside  Disposition Plan: patient will be discharged in the morning and go to [facility] for further management  Consults called:   Admission status: observation, telemetry   Kathie Dike MD [facility]   If 7PM-7AM, please contact night-coverage [email or web address]   08/03/2019, 5:04 PM

## 2019-08-03 NOTE — ED Notes (Signed)
Hospitalist at bedside 

## 2019-08-03 NOTE — ED Notes (Signed)
Lab in drawing 2nd blood culture.

## 2019-08-03 NOTE — ED Provider Notes (Addendum)
[facility] EMERGENCY DEPARTMENT Provider Note   CSN: [id number] Arrival date & time: 08/03/19  1038     History Chief Complaint  Patient presents with  . Weakness    Alexander Burton is a 79 y.o. male.  79yo male with complex past medical history including recurrent cholangitis, chronic hepatic abscess, A. fib (on aspirin), indwelling external and internal drains for his chronic hepatic abscess, on regimen of rotating antibiotics including Cipro, Augmentin, Bactrim (takes each antibiotic for 1 month and then change his antibiotic, currently on Bactrim). Per wife, patient symptoms started last Tuesday with a loss of appetite and decrease in energy/generalized weakness, started monitoring his temperature and states temperature readings were in the 99 range. Patient was up last night with vomiting, temp check this morning of 101.6, patient called his GI office and was advised to come to the emergency room for evaluation. Patient flushes his external drain 3 times daily at baseline, he attached a bag to his drain today and states normally when he touches the bag it flows freely however has had minimal output today. Patient denies having any pain, no changes in bowel or bladder habits, no known sick contacts. No other complaints or concerns.  Alexander Burton was evaluated in Emergency Department on 08/03/2019 for the symptoms described in the history of present illness. He was evaluated in the context of the global COVID-19 pandemic, which necessitated consideration that the patient might be at risk for infection with the SARS-CoV-2 virus that causes COVID-19. Institutional protocols and algorithms that pertain to the evaluation of patients at risk for COVID-19 are in a state of rapid change based on information released by regulatory bodies including the CDC and federal and state organizations. These policies and algorithms were followed during the patient's care in the ED.         Past Medical  History:  Diagnosis Date  . BPH (benign prostatic hyperplasia)   . CAD (coronary artery disease)    DES to LAD 06/2008  . Colon polyps   . COPD (chronic obstructive pulmonary disease) ([place])   . Gastritis   . Goiter   . 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 History of syncope January 2010   PVC's and14 beats NSVT as well as atrial afib  . Hyperlipidemia   . Melanoma ([place])   . Osteoarthritis   . PAF (paroxysmal atrial fibrillation) ([place])   . Prostate CA ([place])   . Sleep apnea 04/02/2005    Patient Active Problem List   Diagnosis Date Noted  . Cholangitis 08/03/2019  . Anorexia 02/17/2019  . Inguinal hernia, right 02/17/2019  . Sepsis ([place]) 03/26/2014  . Thrombocytopenia ([place]) 03/24/2014  . Anemia of chronic disease 03/24/2014  . Fever 03/23/2014  . Bacteremia - enterococcal 12/09/2013  . Hyperlipidemia with target LDL less than 70   . CAD (coronary artery disease) 12/07/2012  . Presence of drug coated stent in LAD coronary artery   . PAF (paroxysmal atrial fibrillation) ([place])   . Pre-op evaluation 11/25/2012  . Cholangitis, recurrent 01/17/2012  . Weight loss, unintentional 12/25/2011  . Diarrhea 12/05/2011  . Elevated liver enzymes 11/14/2011  . Night sweats 11/07/2011  . CAD S/P percutaneous coronary angioplasty 04/03/2011  . Prostate enlargement 04/03/2011  . Bony sclerosis 04/03/2011  . Sleep apnea 04/03/2011  . Essential hypertension   . Liver abscess 03/27/2011    Past Surgical History:  Procedure Laterality Date  . CARDIAC CATHETERIZATION  Feb 2010  post DES to LAD ,3.5 X 33 mm Cypher stent,dilated up to 3.8 mm 80 to90% stenosis btwn first and second diag branc w.moderate disease EF 50%  . CHOLECYSTECTOMY     Complicated by rupture of common bile duct and other vascular structures. Has now had multiple issues with chronic hepatic abscess. Currently has indwelling drain  . COLONOSCOPY  08/16/2011   Procedure: COLONOSCOPY;   Surgeon: Rogene Houston, MD;  Location: [facility];  Service: Endoscopy;  Laterality: N/A;  1200  . ESOPHAGEAL DILATION N/A 01/20/2015   Procedure: ESOPHAGEAL DILATION;  Surgeon: Rogene Houston, MD;  Location: [facility];  Service: Endoscopy;  Laterality: N/A;  . ESOPHAGOGASTRODUODENOSCOPY N/A 01/20/2015   Procedure: ESOPHAGOGASTRODUODENOSCOPY (EGD);  Surgeon: Rogene Houston, MD;  Location: [facility] SU[facility];  Service: Endoscopy;  Laterality: N/A;  1200  . LEA DOPPLER  07/28/2008   NORMAL RGT GROIN DUPLEX DOPPLER  . liver stent    . LIVER SURGERY  09-03/14   @ [facility]  . Melanoma resection         Family History  Problem Relation Age of Onset  . Colon cancer Sister     Social History   Tobacco Use  . Smoking status: Former Smoker    Packs/day: 1.50    Years: 50.00    Pack years: 75.00    Types: Cigarettes    Quit date: 05/29/2007    Years since quitting: 12.1  . Smokeless tobacco: Never Used  Substance Use Topics  . Alcohol use: Yes    Alcohol/week: 1.0 standard drinks    Types: 1 Standard drinks or equivalent per week    Comment: very rare  . Drug use: No    Home Medications Prior to Admission medications   Medication Sig Start Date End Date Taking? Authorizing Provider  amoxicillin-clavulanate (AUGMENTIN) 875-125 MG tablet Take 1 tablet by mouth as directed. Patient will take 1 tablet twice a day for 1 month; current regimen is Bactrim - will start Augmentin in May 07/14/19  Yes [provider]  aspirin EC 81 MG tablet Take 81 mg by mouth every evening.   Yes [provider]  ciprofloxacin (CIPRO) 500 MG tablet Take 1 tablet by mouth as directed. Patient will take 1 tablet twice a day for 1 month; current regimen is Bactrim - will start Cipro in April 07/14/19  Yes [provider]  cycloSPORINE (RESTASIS) 0.05 % ophthalmic emulsion Place 1 drop into both eyes 2 (two) times daily.    Yes [provider]  finasteride (PROSCAR) 5 MG tablet  Take 5 mg by mouth every morning.    Yes [provider]  metoprolol tartrate (LOPRESSOR) 25 MG tablet TAKE 1/2 TABLET(12.5 MG) BY MOUTH TWICE DAILY Patient taking differently: Take 12.5 mg by mouth 2 (two) times daily.  07/20/19  Yes Satira Sark, MD  Multiple Vitamin (MULITIVITAMIN WITH MINERALS) TABS Take 1 tablet by mouth every morning.    Yes [provider]  Multiple Vitamins-Minerals (PRESERVISION AREDS) CAPS Take 1 capsule by mouth in the morning and at bedtime.    Yes [provider]  naproxen sodium (ALEVE) 220 MG tablet Take 220 mg by mouth 2 (two) times daily as needed.    Yes [provider]  nitroGLYCERIN (NITROSTAT) 0.4 MG SL tablet Place 0.4 mg under the tongue every 5 (five) minutes as needed for chest pain.   Yes [provider]  Oxymetazoline HCl (AFRIN NASAL SPRAY NA) Place 2 sprays into  the nose 2 (two) times daily as needed.    Yes [provider]  pravastatin (PRAVACHOL) 20 MG tablet TAKE 1 TABLET(20 MG) BY MOUTH DAILY Patient taking differently: Take 20 mg by mouth daily.  07/20/19  Yes Satira Sark, MD  Probiotic Product (PROBIOTIC DAILY PO) Take 1 tablet by mouth daily.   Yes [provider]  sildenafil (REVATIO) 20 MG tablet Take 1-5 tablets by mouth daily as needed. 07/28/18  Yes [provider]  sodium chloride flush (NS) 0.9 % SOLN Place 10 mLs into feeding tube every 8 (eight) hours.   Yes [provider]  sulfamethoxazole-trimethoprim (BACTRIM DS) 800-160 MG tablet Take 1 tablet by mouth 2 (two) times daily. 05/27/19  Yes [provider]  tamsulosin (FLOMAX) 0.4 MG CAPS capsule Take 0.4 mg by mouth in the morning and at bedtime.  06/24/15  Yes [provider]  ursodiol (ACTIGALL) 250 MG tablet TAKE 1 TABLET(250 MG) BY MOUTH TWICE DAILY Patient taking differently: Take 250 mg by mouth in the morning and at bedtime.  11/10/18  Yes Rehman, Mechele Dawley, MD    Allergies     Contrast media [iodinated diagnostic agents], Metrizamide, and Other  Review of Systems   Review of Systems  Constitutional: Positive for fatigue and fever.  Respiratory: Negative for shortness of breath.   Cardiovascular: Negative for chest pain.  Gastrointestinal: Positive for nausea and vomiting. Negative for abdominal pain, constipation and diarrhea.  Genitourinary: Negative for difficulty urinating, dysuria and frequency.  Musculoskeletal: Negative for back pain.  Skin: Negative for rash and wound.  Neurological: Positive for weakness.  Psychiatric/Behavioral: Negative for confusion.  All other systems reviewed and are negative.   Physical Exam Updated Vital Signs BP (!) 143/94   Pulse 66   Temp 98 F (36.7 C) (Oral)   Resp 20   Ht '5\' 11"'  (1.803 m)   Wt 77.1 kg   SpO2 100%   BMI 23.71 kg/m   Physical Exam Vitals and nursing note reviewed.  Constitutional:      General: He is not in acute distress.    Appearance: He is well-developed. He is not diaphoretic.  HENT:     Head: Normocephalic and atraumatic.  Cardiovascular:     Rate and Rhythm: Normal rate and regular rhythm.     Pulses: Normal pulses.     Heart sounds: Normal heart sounds.  Pulmonary:     Effort: Pulmonary effort is normal.     Breath sounds: Normal breath sounds.  Abdominal:     Palpations: Abdomen is soft.     Tenderness: There is no abdominal tenderness.     Comments: Multiple well-healed surgical scars. Drain in place to right upper quadrant, small amount of dark tea colored drainage present in line, no drainage in bag.  Musculoskeletal:     Right lower leg: No edema.     Left lower leg: No edema.  Skin:    General: Skin is warm and dry.     Findings: No erythema or rash.  Neurological:     Mental Status: He is alert and oriented to person, place, and time.  Psychiatric:        Behavior: Behavior normal.     ED Results / Procedures / Treatments   Labs (all labs ordered are listed,  but only abnormal results are displayed) Labs Reviewed  CBC WITH DIFFERENTIAL/PLATELET - Abnormal; Notable for the following components:      Result Value   WBC 13.0 (*)  Platelets 82 (*)    Neutro Abs 12.0 (*)    Lymphs Abs 0.3 (*)    Abs Immature Granulocytes 0.10 (*)    All other components within normal limits  COMPREHENSIVE METABOLIC PANEL - Abnormal; Notable for the following components:   Glucose, Bld 159 (*)    Creatinine, Ser 1.29 (*)    Calcium 8.4 (*)    Albumin 3.3 (*)    AST 60 (*)    ALT 57 (*)    Alkaline Phosphatase 212 (*)    Total Bilirubin 2.6 (*)    GFR calc non Af Amer 52 (*)    All other components within normal limits  LACTIC ACID, PLASMA - Abnormal; Notable for the following components:   Lactic Acid, Venous 2.5 (*)    All other components within normal limits  CULTURE, BLOOD (ROUTINE X 2)  CULTURE, BLOOD (ROUTINE X 2)  SARS CORONAVIRUS 2 (TAT 6-24 HRS)  LIPASE, BLOOD  LACTIC ACID, PLASMA  URINALYSIS, ROUTINE W REFLEX MICROSCOPIC    EKG EKG Interpretation  Date/Time:  Monday August 03 2019 12:10:09 EST Ventricular Rate:  70 PR Interval:    QRS Duration: 124 QT Interval:  389 QTC Calculation: 420 R Axis:   -16 Text Interpretation: Sinus rhythm Nonspecific intraventricular conduction delay No significant change since last tracing Confirmed by Dorie Rank [phone]) on 08/03/2019 12:15:36 PM   Radiology DG Abd Acute W/Chest  Result Date: 08/03/2019 CLINICAL DATA:  Weakness decreased appetite and fever reported history of stents in the liver. EXAM: DG ABDOMEN ACUTE W/ 1V CHEST COMPARISON:  03/23/2014 FINDINGS: Leads project over the anterior chest. Cardiomediastinal contours are enlarged and accentuated by portable technique. Lungs are clear. No signs of free air beneath either right or left hemidiaphragm. Multiple surgical clips are present in the right upper quadrant. There is a biliary drain in place passing from the midline towards the right upper  quadrant. This drain passes through a stent likely at the porta hepatis. No comparison studies for confirmation such as CT are available. Postoperative changes related to bowel are noted in the right lower quadrant. There are no dilated loops of bowel to suggest bowel obstruction. Density projecting over the left lateral aspect of L5 and over the left and right iliac and sacrum. Also did dense sclerosis over the lower sacrum and left acetabulum as well as left proximal femur. These areas were present on the previous exam. Stool and gas seen in the distal colon. Small amount of gas likely present in the rectum. IMPRESSION: 1. No evidence of bowel obstruction or free air. 2. Signs of biliary stenting and percutaneous biliary drainage catheter. Postoperative changes in the right upper quadrant compatible with partial, right, hepatectomy as before. Based on comparison with 2015 examination the distal portion of the catheter is likely at the level of the biliary-enteric anastomosis. Correlate with bilirubin and catheter function. 3. Signs of sclerosis of areas in the lower lumbar spine and pelvis as described. These are unchanged since 2015. Electronically Signed   By: Zetta Bills M.D.   On: 08/03/2019 13:35    Procedures Procedures (including critical care time)  Medications Ordered in ED Medications  sodium chloride 0.9 % bolus 1,000 mL (0 mLs Intravenous Stopped 08/03/19 1300)  piperacillin-tazobactam (ZOSYN) IVPB 3.375 g (0 g Intravenous Stopped 08/03/19 1300)  sodium chloride 0.9 % bolus 1,000 mL (0 mLs Intravenous Stopped 08/03/19 1456)    ED Course  I have reviewed the triage vital signs and the nursing notes.  Pertinent labs & imaging results that were available during my care of the patient were reviewed by me and considered in my medical decision making (see chart for details).  Clinical Course as of Aug 03 1543  Mon Aug 03, 2019  1251 07/21/19 labs WBC 4.5 Cr 1.15 Bili, total 1.3 Alk phos  163 AST 23 ALT 18   [LM]  [id number] 79 year old male with past medical history of recurrent cholangitis, see records, has internal and external drain in place.  Patient comes to ER today with complaint of weakness over the past week with fatigue and decrease in appetite, concern for fever of 101.6 this morning, patient attached his drain as per protocol and had minimal output present.  Patient arrived in the ER afebrile, blood pressures as low as 27S systolic, have improved with IV fluids.  She was given empiric antibiotic average with Zosyn.  Initial lactic acid returns elevated at 2.5, repeat has improved to 1.3.  CBC with leukocytosis with white count of 13,000 with increased neutrophils, platelets at 82 however improved from labs which wife has a bedside.  CMP with concern for elevated LFTs, alk phos, total bili.  Pace within normal notes.  Cultures and Covid testing pending.  Discussed with Dr. [name], requests number for patient's [facility] specialist to discuss treatment plan.  Nelida Meuse, PA-C [phone] (c), [phone] (w)   [LM]  [phone] Plan is to admit to hospitalist service for monitoring, suspect cholangitis, Dr. [name] available if needed, discharge tomorrow for patient to go to [facility] for drain evaluation. Dr. [name] will order prednisone per GI team at [facility] request. Will need copy of his COVID test results when discharged.   [LM]  1545 Case discussed with hospitalist who will consult for admission.    [LM]    Clinical Course User Index [LM] Roque Lias   MDM Rules/Calculators/A&P                      Final Clinical Impression(s) / ED Diagnoses Final diagnoses:  Cholangitis    Rx / DC Orders ED Discharge Orders    None       Tacy Learn, PA-C 08/03/19 1545    Tacy Learn, PA-C 08/03/19 1546    Dorie Rank, MD 08/04/19 [phone]

## 2019-08-03 NOTE — Progress Notes (Signed)
Pharmacy Antibiotic Note  Alexander Burton is a 79 y.o. male admitted on 08/03/2019 with intra abdominal infection.  Pharmacy has been consulted for zosyn dosing.  Plan: Zosyn 3.375g IV q8h (4 hour infusion).  Height: 5\' 11"  (180.3 cm) Weight: 170 lb (77.1 kg) IBW/kg (Calculated) : 75.3  Temp (24hrs), Avg:98 F (36.7 C), Min:98 F (36.7 C), Max:98 F (36.7 C)  Recent Labs  Lab 08/03/19 1141 08/03/19 1350  WBC 13.0*  --   CREATININE 1.29*  --   LATICACIDVEN 2.5* 1.3    Estimated Creatinine Clearance: 48.6 mL/min (A) (by C-G formula based on SCr of 1.29 mg/dL (H)).    Allergies  Allergen Reactions  . Contrast Media [Iodinated Diagnostic Agents]     Hives, needs pre meds  . Metrizamide Hives    Hives, needs pre meds  . Other Itching and Other (See Comments)    Contrast dye erythema    Antimicrobials this admission: 3/8 zosyn >>    Microbiology results: 3/8 [id number]    Thank you for allowing pharmacy to be a part of this patient's care.  Donna Christen Carmin Alvidrez 08/03/2019 4:47 PM

## 2019-08-03 NOTE — ED Triage Notes (Signed)
Pt has liver stents, called GI office and was told to come to ED for eval. Pt c/o of weakness, decreased in appetite, fever since last night.

## 2019-08-03 NOTE — ED Notes (Signed)
Mid abdominal drain tube intact with small amount of bile noted in tubing, drainage bag (empty) hanging on frame of stretcher.

## 2019-08-03 NOTE — ED Notes (Signed)
Date and time results received: 08/03/19 1211  Test: Lactic Acid   Critical Value: 2.5  Name of Provider Notified: Dr Hillard Danker  Orders Received? Or Actions Taken?: see new order

## 2019-08-03 NOTE — ED Notes (Signed)
Pt has not voided.

## 2019-08-04 DIAGNOSIS — R7881 Bacteremia: Secondary | ICD-10-CM | POA: Diagnosis not present

## 2019-08-04 DIAGNOSIS — K8309 Other cholangitis: Secondary | ICD-10-CM | POA: Diagnosis not present

## 2019-08-04 DIAGNOSIS — N400 Enlarged prostate without lower urinary tract symptoms: Secondary | ICD-10-CM | POA: Diagnosis not present

## 2019-08-04 DIAGNOSIS — D696 Thrombocytopenia, unspecified: Secondary | ICD-10-CM | POA: Diagnosis not present

## 2019-08-04 DIAGNOSIS — I10 Essential (primary) hypertension: Secondary | ICD-10-CM | POA: Diagnosis not present

## 2019-08-04 LAB — BLOOD CULTURE ID PANEL (REFLEXED)
Acinetobacter baumannii: NOT DETECTED
Candida albicans: NOT DETECTED
Candida glabrata: NOT DETECTED
Candida krusei: NOT DETECTED
Candida parapsilosis: NOT DETECTED
Candida tropicalis: NOT DETECTED
Carbapenem resistance: NOT DETECTED
Enterobacter cloacae complex: NOT DETECTED
Enterobacteriaceae species: DETECTED — AB
Enterococcus species: NOT DETECTED
Escherichia coli: DETECTED — AB
Haemophilus influenzae: NOT DETECTED
Klebsiella oxytoca: NOT DETECTED
Klebsiella pneumoniae: DETECTED — AB
Listeria monocytogenes: NOT DETECTED
Neisseria meningitidis: NOT DETECTED
Proteus species: NOT DETECTED
Pseudomonas aeruginosa: NOT DETECTED
Serratia marcescens: NOT DETECTED
Staphylococcus aureus (BCID): NOT DETECTED
Staphylococcus species: NOT DETECTED
Streptococcus agalactiae: NOT DETECTED
Streptococcus pneumoniae: NOT DETECTED
Streptococcus pyogenes: NOT DETECTED
Streptococcus species: NOT DETECTED

## 2019-08-04 LAB — CBC
HCT: 36.4 % — ABNORMAL LOW (ref 39.0–52.0)
Hemoglobin: 11.4 g/dL — ABNORMAL LOW (ref 13.0–17.0)
MCH: 29 pg (ref 26.0–34.0)
MCHC: 31.3 g/dL (ref 30.0–36.0)
MCV: 92.6 fL (ref 80.0–100.0)
Platelets: 51 10*3/uL — ABNORMAL LOW (ref 150–400)
RBC: 3.93 MIL/uL — ABNORMAL LOW (ref 4.22–5.81)
RDW: 15.1 % (ref 11.5–15.5)
WBC: 6.9 10*3/uL (ref 4.0–10.5)
nRBC: 0 % (ref 0.0–0.2)

## 2019-08-04 MED ORDER — SODIUM CHLORIDE 0.9 % IV SOLN
1.0000 g | Freq: Two times a day (BID) | INTRAVENOUS | Status: DC
  Administered 2019-08-04: 1 g via INTRAVENOUS
  Filled 2019-08-04: qty 1

## 2019-08-04 MED ORDER — PREDNISONE 20 MG PO TABS
50.0000 mg | ORAL_TABLET | Freq: Once | ORAL | Status: DC | PRN
  Filled 2019-08-04: qty 1

## 2019-08-04 NOTE — Progress Notes (Signed)
Blood culture set 1: aerobic and anaerobic gram negative rods  Blood culture set 2: aerobic gram negative rods MD notified

## 2019-08-04 NOTE — Discharge Instructions (Signed)
Cholangitis  Cholangitis is inflammation of the group of tubes (ducts) that carry digestive juices from the liver, gallbladder, and pancreas to the small intestine. This group of ducts is called the biliary tract. Cholangitis can cause fever, abdominal pain, and yellowish discoloration of the skin, the whites of the eyes, and mucous membranes (jaundice). Cholangitis can get worse very quickly and cause infection throughout the body (sepsis). It is important to diagnose and treat cholangitis as soon as possible. What are the causes? This condition is usually caused by a blockage (obstruction) in the biliary tract. The most common causes of obstruction are:  Formation of hard particles (stones) in the biliary tract.  Damage to the biliary tract from a previous surgical or diagnostic procedure. Other causes of an obstruction include:  Cysts or tumors in the biliary tract.  A type of liver disease that affects the biliary tract (primary sclerosing cholangitis).  Being born with a narrow biliary tract. When the flow of digestive juices is blocked, bacteria that normally live in the intestine can grow and spread inside the biliary tract. What increases the risk? The following factors may make you more likely to develop this condition:  Being 50?81 years old.  Having a history of stones in the biliary tract.  Having had cholangitis in the past.  Having HIV.  Having another condition that affects the biliary tract.  Having had a procedure to diagnose or treat problems with the biliary tract, especially endoscopic retrograde cholangiopancreatography (ERCP). These types of procedures may cause scarring and obstruction that can lead to infection. What are the signs or symptoms? The most common symptoms of this condition are fever, abdominal pain, and jaundice. Often, all of these symptoms are present. Other symptoms may include:  Chills.  Tiredness.  Nausea.  Dark-colored  urine.  Clay-colored stools.  Confusion.  Itchy skin. How is this diagnosed? This condition may be diagnosed based on:  Your symptoms.  A physical exam.  Your medical history. Your health care provider may ask whether you have had stones, ERCP, or other procedures involving the biliary tract in the past.  Blood tests.  Imaging studies, such as: ? An ultrasound. This uses sound waves to make an image of any obstructions that you have. ? An MRI. ? A CT scan.  ERCP to check the biliary tract for possible causes of cholangitis. During ERCP, a thin, lighted tube (endoscope) is passed through your mouth and down your throat into the first part of your small intestine (duodenum). A small, plastic tube (cannula) is then passed through the endoscope and directed into your bile duct or pancreatic duct. Dye is then injected through the cannula and X-rays are taken. How is this treated? This condition is usually treated at a hospital. Treatment may include:  Receiving fluids, nutrition, and antibiotic medicines through an IV line. You may be given antibiotics that kill most of the bacteria known to cause cholangitis (broad spectrumantibiotics).  ERCP or another surgical procedure to open and drain the biliary tract. Follow these instructions at home: Medicines  Take over-the-counter and prescription medicines only as told by your health care provider.  Take your antibiotic medicine as told by your health care provider. Do not stop taking the antibiotic even if you start to feel better. General instructions  Follow instructions from your health care provider about eating or drinking restrictions.  Maintain a healthy weight.  Keep all follow-up visits as told by your health care provider. This is important. Activity  Exercise   regularly, as told by your health care provider.  Return to your normal activities as told by your health care provider. Ask your health care provider what  activities are safe for you. Contact a health care provider if you:  Have symptoms that return or become more severe.  Suddenly lose weight. Get help right away if you:  Have a fever.  Have chills.  Have severe abdominal pain.  Feel dizzy or lightheaded. Summary  Cholangitis is inflammation of the group of tubes (ducts) that carry digestive juices from the liver, gallbladder, and pancreas to the small intestine.  This condition is usually caused by a blockage (obstruction) in the biliary tract.  The most common symptoms of this condition are fever, abdominal pain, and jaundice.  This condition is usually treated at a hospital. This information is not intended to replace advice given to you by your health care provider. Make sure you discuss any questions you have with your health care provider. Document Revised: 01/16/2018 Document Reviewed: 01/16/2018 Elsevier Patient Education  2020 Elsevier Inc.  

## 2019-08-04 NOTE — Plan of Care (Signed)

## 2019-08-04 NOTE — Progress Notes (Addendum)
Pharmacy Antibiotic Note  Alexander Burton is a 79 y.o. male admitted on 08/03/2019 with bacteremia and intra-abdominal infection.  Pharmacy has been consulted for Meropenem dosing due to increased risk of ESBL at [facility].  Plan: Meropenem 1000 mg IV every 12 hours. Monitor labs, c/s, and patient improvement. F/U renal improvement for dose adjustment.  Height: 5\' 11"  (180.3 cm) Weight: 170 lb (77.1 kg) IBW/kg (Calculated) : 75.3  Temp (24hrs), Avg:98.2 F (36.8 C), Min:97.7 F (36.5 C), Max:98.7 F (37.1 C)  Recent Labs  Lab 08/03/19 1141 08/03/19 1350 08/04/19 0508  WBC 13.0*  --  6.9  CREATININE 1.29*  --   --   LATICACIDVEN 2.5* 1.3  --     Estimated Creatinine Clearance: 48.6 mL/min (A) (by C-G formula based on SCr of 1.29 mg/dL (H)).    Allergies  Allergen Reactions  . Contrast Media [Iodinated Diagnostic Agents]     Hives, needs pre meds  . Metrizamide Hives    Hives, needs pre meds  . Other Itching and Other (See Comments)    Contrast dye erythema    Antimicrobials this admission: Merrem 3/9 >>  Zosyn 3/8 >>   Dose adjustments this admission: [place]  Microbiology results: 3/8 BCx: E.coli/Klebsiella   Thank you for allowing pharmacy to be a part of this patient's care.  Margot Ables, PharmD Clinical Pharmacist 08/04/2019 8:18 AM

## 2019-08-04 NOTE — Discharge Summary (Signed)
Physician Discharge Summary  Alexander Burton [id number] DOB: 04/12/1941 DOA: 08/03/2019  PCP: Celene Squibb, MD  Admit date: 08/03/2019 Discharge date: 08/04/2019  Admitted From: Home Disposition: Patient will discharge and present to [facility] for further management  Recommendations for Outpatient Follow-up:  1. 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  He is followed at [facility] and now has an internal and external stent.  Patient presented to the emergency room with fever, generalized weakness as well as vomiting.  Blood pressure was noted to be mildly low 1 lactic acid was 2.5.  He had mildly elevated WBC count at 13,000.  Bilirubin of 2.6.  Care was reviewed with gastroenterology as well as his team at [facility] who recommended admitting the patient to [facility] hospital overnight for IV fluids and antibiotics.  Plan was to have the patient follow-up at [facility] the following day for stent exchange since it was felt that this stent was likely obstructed.  Discharge Diagnoses:  Active Problems:   Essential hypertension   Prostate enlargement   Elevated liver enzymes   Hyperlipidemia with target LDL less than 70   Thrombocytopenia (HCC)   Cholangitis   Bacteremia due to Gram-negative bacteria  1. Recurrent cholangitis.    Patient was treated with intravenous Zosyn.  Blood cultures positive for E. coli and Klebsiella.  Sensitivities currently pending.  He was transitioned to meropenem.  Patient has been afebrile and hemodynamically stable.  His care was discussed with  his team at [facility] who asked that patient be discharged and follow-up later on the afternoon at [facility] for biliary stent exchange.  He will likely be admitted there for further treatment of bacteremia after the procedure 2. Hypertension.  Continue on metoprolol 3. Thrombocytopenia.  Chronic.  Likely exacerbated due to acute infective process 4. Hyperlipidemia.  Hold statin for now in light of elevated LFTs. 5. Prostate enlargement.  Continue on tamsulosin.  Discharge Instructions  Discharge Instructions    Diet - low sodium heart healthy   Complete by: As directed    Increase activity slowly   Complete by: As directed      Allergies as of 08/04/2019      Reactions   Contrast Media [iodinated Diagnostic Agents]    Hives, needs pre meds   Metrizamide Hives   Hives, needs pre meds   Other Itching, Other (See Comments)   Contrast dye erythema      Medication List    TAKE these medications   AFRIN NASAL SPRAY NA Place 2 sprays into the nose 2 (two) times daily as needed.   Aleve 220 MG tablet Generic drug: naproxen sodium Take 220 mg by mouth 2 (two) times daily as needed.   amoxicillin-clavulanate 875-125 MG tablet Commonly known as: AUGMENTIN Take 1 tablet by mouth as directed. Patient will take 1 tablet twice a day for 1 month; current regimen is Bactrim - will start Augmentin in May   aspirin EC 81 MG tablet Take 81 mg by mouth every evening.   ciprofloxacin 500 MG tablet Commonly known as: CIPRO Take 1 tablet by mouth as directed. Patient will take 1 tablet twice a  day for 1 month; current regimen is Bactrim - will start Cipro in April   cycloSPORINE 0.05 % ophthalmic emulsion Commonly known as: RESTASIS Place 1 drop into both eyes 2 (two) times daily.   finasteride 5 MG tablet Commonly known as: PROSCAR Take 5 mg by mouth every morning.   metoprolol tartrate 25 MG tablet Commonly known as: LOPRESSOR TAKE 1/2 TABLET(12.5 MG) BY MOUTH TWICE  DAILY What changed: See the new instructions.   multivitamin with minerals Tabs tablet Take 1 tablet by mouth every morning.   nitroGLYCERIN 0.4 MG SL tablet Commonly known as: NITROSTAT Place 0.4 mg under the tongue every 5 (five) minutes as needed for chest pain.   pravastatin 20 MG tablet Commonly known as: PRAVACHOL TAKE 1 TABLET(20 MG) BY MOUTH DAILY What changed: See the new instructions.   PreserVision AREDS Caps Take 1 capsule by mouth in the morning and at bedtime.   PROBIOTIC DAILY PO Take 1 tablet by mouth daily.   sildenafil 20 MG tablet Commonly known as: REVATIO Take 1-5 tablets by mouth daily as needed.   sodium chloride flush 0.9 % Soln Commonly known as: NS Place 10 mLs into feeding tube every 8 (eight) hours.   sulfamethoxazole-trimethoprim 800-160 MG tablet Commonly known as: BACTRIM DS Take 1 tablet by mouth 2 (two) times daily.   tamsulosin 0.4 MG Caps capsule Commonly known as: FLOMAX Take 0.4 mg by mouth in the morning and at bedtime.   ursodiol 250 MG tablet Commonly known as: ACTIGALL TAKE 1 TABLET(250 MG) BY MOUTH TWICE DAILY What changed: See the new instructions.       Allergies  Allergen Reactions  . Contrast Media [Iodinated Diagnostic Agents]     Hives, needs pre meds  . Metrizamide Hives    Hives, needs pre meds  . Other Itching and Other (See Comments)    Contrast dye erythema    Consultations:     Procedures/Studies: DG Abd Acute W/Chest  Result Date: 08/03/2019 CLINICAL DATA:  Weakness decreased appetite and fever reported history of stents in the liver. EXAM: DG ABDOMEN ACUTE W/ 1V CHEST COMPARISON:  03/23/2014 FINDINGS: Leads project over the anterior chest. Cardiomediastinal contours are enlarged and accentuated by portable technique. Lungs are clear. No signs of free air beneath either right or left hemidiaphragm. Multiple surgical clips are present in the right upper quadrant. There is a biliary drain in place  passing from the midline towards the right upper quadrant. This drain passes through a stent likely at the porta hepatis. No comparison studies for confirmation such as CT are available. Postoperative changes related to bowel are noted in the right lower quadrant. There are no dilated loops of bowel to suggest bowel obstruction. Density projecting over the left lateral aspect of L5 and over the left and right iliac and sacrum. Also did dense sclerosis over the lower sacrum and left acetabulum as well as left proximal femur. These areas were present on the previous exam. Stool and gas seen in the distal colon. Small amount of gas likely present in the rectum. IMPRESSION: 1. No evidence of bowel obstruction or free air. 2. Signs of biliary stenting and percutaneous biliary drainage catheter. Postoperative changes in the right upper quadrant compatible with partial, right, hepatectomy as before. Based on comparison with 2015 examination the distal portion of the catheter is likely at the level of the biliary-enteric anastomosis. Correlate with bilirubin and catheter function. 3. Signs of sclerosis of areas in the lower lumbar spine and  pelvis as described. These are unchanged since 2015. Electronically Signed   By: Zetta Bills M.D.   On: 08/03/2019 13:35      Subjective: Feeling better.  Afebrile overnight.  Is noticing more output in drain.  Discharge Exam: Vitals:   08/03/19 2033 08/03/19 2043 08/03/19 2208 08/04/19 0600  BP:  125/66 (!) 113/56 (!) 101/57  Pulse:  68 71 (!) 54  Resp:  18 18 18   Temp:  98.7 F (37.1 C)  97.7 F (36.5 C)  TempSrc:    Oral  SpO2: 98% 99% 97% 98%  Weight:      Height:        General: Pt is alert, awake, not in acute distress Cardiovascular: RRR, S1/S2 +, no rubs, no gallops Respiratory: CTA bilaterally, no wheezing, no rhonchi Abdominal: Soft, NT, ND, bowel sounds +, hepatic drain with dark-colored fluid Extremities: no edema, no cyanosis    The results  of significant diagnostics from this hospitalization (including imaging, microbiology, ancillary and laboratory) are listed below for reference.     Microbiology: Recent Results (from the past 240 hour(s))  Culture, blood (routine x 2)     Status: None (Preliminary result)   Collection Time: 08/03/19 11:41 AM   Specimen: BLOOD RIGHT ARM  Result Value Ref Range Status   Specimen Description   Final    BLOOD RIGHT ARM Performed at [facility], [address] [place], [place] [address]    Special Requests   Final    BOTTLES DRAWN AEROBIC AND ANAEROBIC Blood Culture adequate volume Performed at [facility], [address] [place], [place] [address]    Culture  Setup Time   Final    IN BOTH AEROBIC AND ANAEROBIC BOTTLES GRAM NEGATIVE RODS Gram Stain Report Called to,Read Back By and Verified With: A MUHAMMAD,RN @0207  08/04/19 MKELLY Organism ID to follow Performed at [facility], [place] [address], [place],  [address]    Culture GRAM NEGATIVE RODS  Final   Report Status PENDING  Incomplete  Blood Culture ID Panel (Reflexed)     Status: Abnormal   Collection Time: 08/03/19 11:41 AM  Result Value Ref Range Status   Enterococcus species NOT DETECTED NOT DETECTED Final   Listeria monocytogenes NOT DETECTED NOT DETECTED Final   Staphylococcus species NOT DETECTED NOT DETECTED Final   Staphylococcus aureus (BCID) NOT DETECTED NOT DETECTED Final   Streptococcus species NOT DETECTED NOT DETECTED Final   Streptococcus agalactiae NOT DETECTED NOT DETECTED Final   Streptococcus pneumoniae NOT DETECTED NOT DETECTED Final   Streptococcus pyogenes NOT DETECTED NOT DETECTED Final   Acinetobacter baumannii NOT DETECTED NOT DETECTED Final   Enterobacteriaceae species DETECTED (A) NOT DETECTED Final    Comment: CRITICAL RESULT CALLED TO, READ BACK BY AND VERIFIED WITH: Gloris Manchester PharmD 7:45 08/04/19 ([email or web address])    Enterobacter cloacae complex NOT DETECTED NOT DETECTED Final   Escherichia  coli DETECTED (A) NOT DETECTED Final    Comment: CRITICAL RESULT CALLED TO, READ BACK BY AND VERIFIED WITH: Gloris Manchester PharmD 7:45 08/04/19 ([email or web address])    Klebsiella oxytoca NOT DETECTED NOT DETECTED Final   Klebsiella pneumoniae DETECTED (A) NOT DETECTED Final    Comment: CRITICAL RESULT CALLED TO, READ BACK BY AND VERIFIED WITH: Gloris Manchester PharmD 7:45 08/04/19 (wilsonm)    Proteus species NOT DETECTED NOT DETECTED Final   Serratia marcescens NOT DETECTED NOT DETECTED Final   Carbapenem resistance NOT DETECTED NOT DETECTED Final   Haemophilus influenzae NOT DETECTED NOT DETECTED  Final   Neisseria meningitidis NOT DETECTED NOT DETECTED Final   Pseudomonas aeruginosa NOT DETECTED NOT DETECTED Final   Candida albicans NOT DETECTED NOT DETECTED Final   Candida glabrata NOT DETECTED NOT DETECTED Final   Candida krusei NOT DETECTED NOT DETECTED Final   Candida parapsilosis NOT DETECTED NOT DETECTED Final   Candida tropicalis NOT DETECTED NOT DETECTED Final    Comment: Performed at [facility], [place] [address] [place], [place] [address]  SARS CORONAVIRUS 2 (TAT 6-24 HRS) Nasopharyngeal Nasopharyngeal Swab     Status: None   Collection Time: 08/03/19 12:07 PM   Specimen: Nasopharyngeal Swab  Result Value Ref Range Status   SARS Coronavirus 2 NEGATIVE NEGATIVE Final    Comment: (NOTE) SARS-CoV-2 target nucleic acids are NOT DETECTED. The SARS-CoV-2 RNA is generally detectable in upper and lower respiratory specimens during the acute phase of infection. Negative results do not preclude SARS-CoV-2 infection, do not rule out co-infections with other pathogens, and should not be used as the sole basis for treatment or other patient management decisions. Negative results must be combined with clinical observations, patient history, and epidemiological information. The expected result is Negative. Fact Sheet for Patients: SugarRoll.be Fact Sheet for Healthcare  Providers: https://www.woods-mathews.com/ This test is not yet approved or cleared by the [place] FDA and  has been authorized for detection and/or diagnosis of SARS-CoV-2 by FDA under an Emergency Use Authorization (EUA). This EUA will remain  in effect (meaning this test can be used) for the duration of the COVID-19 declaration under Section 56 4(b)(1) of the Act, 21 U.S.C. section 360bbb-3(b)(1), unless the authorization is terminated or revoked sooner. Performed at [facility], [place] [address], [place], [place] [address]   Culture, blood (routine x 2)     Status: None (Preliminary result)   Collection Time: 08/03/19 12:25 PM   Specimen: BLOOD LEFT ARM  Result Value Ref Range Status   Specimen Description   Final    BLOOD LEFT ARM Performed at [facility], [address] [place], [place] [address]    Special Requests   Final    BOTTLES DRAWN AEROBIC ONLY Blood Culture adequate volume Performed at [facility], [address], [place], [place] [address]    Culture  Setup Time   Final    AEROBIC BOTTLE ONLY GRAM NEGATIVE RODS Gram Stain Report Called to,Read Back By and Verified With: A [facility] @0208  08/04/19 [facility] Performed at [facility], [address], [place], [place] [address]    Culture GRAM NEGATIVE RODS  Final   Report Status PENDING  Incomplete     Labs: BNP (last 3 results) No results for input(s): BNP in the last 8760 hours. Basic Metabolic Panel: Recent Labs  Lab 08/03/19 1141  NA 137  K 4.4  CL 102  CO2 25  GLUCOSE 159*  BUN 18  CREATININE 1.29*  CALCIUM 8.4*   Liver Function Tests: Recent Labs  Lab 08/03/19 1141  AST 60*  ALT 57*  ALKPHOS 212*  BILITOT 2.6*  PROT 6.7  ALBUMIN 3.3*   Recent Labs  Lab 08/03/19 1141  LIPASE 26   No results for input(s): AMMONIA in the last 168 hours. CBC: Recent Labs  Lab 08/03/19 1141 08/04/19 0508  WBC 13.0* 6.9  NEUTROABS 12.0*  --   HGB 13.2 11.4*  HCT 41.2 36.4*   MCV 91.8 92.6  PLT 82* 51*   Cardiac Enzymes: No results for input(s): CKTOTAL, CKMB, CKMBINDEX, TROPONINI in  the last 168 hours. BNP: Invalid input(s): POCBNP CBG: No results for input(s): GLUCAP in the last 168 hours. D-Dimer No results for input(s): DDIMER in the last 72 hours. Hgb A1c No results for input(s): HGBA1C in the last 72 hours. Lipid Profile No results for input(s): CHOL, HDL, LDLCALC, TRIG, CHOLHDL, LDLDIRECT in the last 72 hours. Thyroid function studies No results for input(s): TSH, T4TOTAL, T3FREE, THYROIDAB in the last 72 hours.  Invalid input(s): FREET3 Anemia work up No results for input(s): VITAMINB12, FOLATE, FERRITIN, TIBC, IRON, RETICCTPCT in the last 72 hours. Urinalysis    Component Value Date/Time   COLORURINE YELLOW 08/03/2019 1138   APPEARANCEUR CLEAR 08/03/2019 1138   LABSPEC 1.023 08/03/2019 1138   PHURINE 6.0 08/03/2019 1138   GLUCOSEU NEGATIVE 08/03/2019 1138   [place] 08/03/2019 1138   [place] 08/03/2019 1138   [place] 08/03/2019 1138   PROTEINUR 30 (A) 08/03/2019 1138   UROBILINOGEN 1.0 03/23/2014 1901   NITRITE NEGATIVE 08/03/2019 1138   LEUKOCYTESUR NEGATIVE 08/03/2019 1138   Sepsis Labs Invalid input(s): PROCALCITONIN,  WBC,  LACTICIDVEN Microbiology Recent Results (from the past 240 hour(s))  Culture, blood (routine x 2)     Status: None (Preliminary result)   Collection Time: 08/03/19 11:41 AM   Specimen: BLOOD RIGHT ARM  Result Value Ref Range Status   Specimen Description   Final    BLOOD RIGHT ARM Performed at [facility], [address], [place], [place] [address]    Special Requests   Final    BOTTLES DRAWN AEROBIC AND ANAEROBIC Blood Culture adequate volume Performed at [facility] [address] [place], [place] [address]    Culture  Setup Time   Final    IN BOTH AEROBIC AND ANAEROBIC BOTTLES GRAM NEGATIVE RODS Gram Stain Report Called to,Read Back By and Verified With: A  [facility] @0207  08/04/19 MKELLY Organism ID to follow Performed at [facility], [place] [address], [place] [address]    Culture GRAM NEGATIVE RODS  Final   Report Status PENDING  Incomplete  Blood Culture ID Panel (Reflexed)     Status: Abnormal   Collection Time: 08/03/19 11:41 AM  Result Value Ref Range Status   Enterococcus species NOT DETECTED NOT DETECTED Final   Listeria monocytogenes NOT DETECTED NOT DETECTED Final   Staphylococcus species NOT DETECTED NOT DETECTED Final   Staphylococcus aureus (BCID) NOT DETECTED NOT DETECTED Final   Streptococcus species NOT DETECTED NOT DETECTED Final   Streptococcus agalactiae NOT DETECTED NOT DETECTED Final   Streptococcus pneumoniae NOT DETECTED NOT DETECTED Final   Streptococcus pyogenes NOT DETECTED NOT DETECTED Final   Acinetobacter baumannii NOT DETECTED NOT DETECTED Final   Enterobacteriaceae species DETECTED (A) NOT DETECTED Final    Comment: CRITICAL RESULT CALLED TO, READ BACK BY AND VERIFIED WITH: Gloris Manchester PharmD 7:45 08/04/19 (wilsonm)    Enterobacter cloacae complex NOT DETECTED NOT DETECTED Final   Escherichia coli DETECTED (A) NOT DETECTED Final    Comment: CRITICAL RESULT CALLED TO, READ BACK BY AND VERIFIED WITH: Gloris Manchester PharmD 7:45 08/04/19 (wilsonm)    Klebsiella oxytoca NOT DETECTED NOT DETECTED Final   Klebsiella pneumoniae DETECTED (A) NOT DETECTED Final    Comment: CRITICAL RESULT CALLED TO, READ BACK BY AND VERIFIED WITH: Gloris Manchester PharmD 7:45 08/04/19 (wilsonm)    Proteus species NOT DETECTED NOT DETECTED Final   Serratia marcescens NOT DETECTED NOT DETECTED Final   Carbapenem resistance NOT DETECTED NOT DETECTED Final  Haemophilus influenzae NOT DETECTED NOT DETECTED Final   Neisseria meningitidis NOT DETECTED NOT DETECTED Final   Pseudomonas aeruginosa NOT DETECTED NOT DETECTED Final   Candida albicans NOT DETECTED NOT DETECTED Final   Candida glabrata NOT DETECTED NOT DETECTED Final   Candida  krusei NOT DETECTED NOT DETECTED Final   Candida parapsilosis NOT DETECTED NOT DETECTED Final   Candida tropicalis NOT DETECTED NOT DETECTED Final    Comment: Performed at [facility], [place] [address], [place], [place] [address]  SARS CORONAVIRUS 2 (TAT 6-24 HRS) Nasopharyngeal Nasopharyngeal Swab     Status: None   Collection Time: 08/03/19 12:07 PM   Specimen: Nasopharyngeal Swab  Result Value Ref Range Status   SARS Coronavirus 2 NEGATIVE NEGATIVE Final    Comment: (NOTE) SARS-CoV-2 target nucleic acids are NOT DETECTED. The SARS-CoV-2 RNA is generally detectable in upper and lower respiratory specimens during the acute phase of infection. Negative results do not preclude SARS-CoV-2 infection, do not rule out co-infections with other pathogens, and should not be used as the sole basis for treatment or other patient management decisions. Negative results must be combined with clinical observations, patient history, and epidemiological information. The expected result is Negative. Fact Sheet for Patients: SugarRoll.be Fact Sheet for Healthcare Providers: https://www.woods-mathews.com/ This test is not yet approved or cleared by the [place] FDA and  has been authorized for detection and/or diagnosis of SARS-CoV-2 by FDA under an Emergency Use Authorization (EUA). This EUA will remain  in effect (meaning this test can be used) for the duration of the COVID-19 declaration under Section 56 4(b)(1) of the Act, 21 U.S.C. section 360bbb-3(b)(1), unless the authorization is terminated or revoked sooner. Performed at [facility], [place] [address], [place], [place] [address]   Culture, blood (routine x 2)     Status: None (Preliminary result)   Collection Time: 08/03/19 12:25 PM   Specimen: BLOOD LEFT ARM  Result Value Ref Range Status   Specimen Description   Final    BLOOD LEFT ARM Performed at [facility], [address]  [place], [place] [address]    Special Requests   Final    BOTTLES DRAWN AEROBIC ONLY Blood Culture adequate volume Performed at [facility], [address], [place], [place] [address]    Culture  Setup Time   Final    AEROBIC BOTTLE ONLY GRAM NEGATIVE RODS Gram Stain Report Called to,Read Back By and Verified With: A [facility] @0208  08/04/19 [facility] Performed at [facility], [address] [place], [place] [address]    Culture GRAM NEGATIVE RODS  Final   Report Status PENDING  Incomplete     Time coordinating discharge: 36mins  SIGNED:   Kathie Dike, MD  [facility] 08/04/2019, 9:23 PM   If 7PM-7AM, please contact night-coverage [email or web address]

## 2019-08-05 DIAGNOSIS — Z87891 Personal history of nicotine dependence: Secondary | ICD-10-CM | POA: Diagnosis not present

## 2019-08-05 DIAGNOSIS — K838 Other specified diseases of biliary tract: Secondary | ICD-10-CM | POA: Diagnosis not present

## 2019-08-05 DIAGNOSIS — I2510 Atherosclerotic heart disease of native coronary artery without angina pectoris: Secondary | ICD-10-CM | POA: Diagnosis present

## 2019-08-05 DIAGNOSIS — R7881 Bacteremia: Secondary | ICD-10-CM | POA: Diagnosis present

## 2019-08-05 DIAGNOSIS — Z9049 Acquired absence of other specified parts of digestive tract: Secondary | ICD-10-CM | POA: Diagnosis not present

## 2019-08-05 DIAGNOSIS — I10 Essential (primary) hypertension: Secondary | ICD-10-CM | POA: Diagnosis present

## 2019-08-05 DIAGNOSIS — T85590A Other mechanical complication of bile duct prosthesis, initial encounter: Secondary | ICD-10-CM | POA: Diagnosis present

## 2019-08-05 DIAGNOSIS — G4733 Obstructive sleep apnea (adult) (pediatric): Secondary | ICD-10-CM | POA: Diagnosis present

## 2019-08-05 DIAGNOSIS — R768 Other specified abnormal immunological findings in serum: Secondary | ICD-10-CM | POA: Diagnosis present

## 2019-08-05 DIAGNOSIS — R911 Solitary pulmonary nodule: Secondary | ICD-10-CM | POA: Diagnosis not present

## 2019-08-05 DIAGNOSIS — Z91041 Radiographic dye allergy status: Secondary | ICD-10-CM | POA: Diagnosis not present

## 2019-08-05 DIAGNOSIS — R509 Fever, unspecified: Secondary | ICD-10-CM | POA: Diagnosis not present

## 2019-08-05 DIAGNOSIS — N400 Enlarged prostate without lower urinary tract symptoms: Secondary | ICD-10-CM | POA: Diagnosis present

## 2019-08-05 DIAGNOSIS — B961 Klebsiella pneumoniae [K. pneumoniae] as the cause of diseases classified elsewhere: Secondary | ICD-10-CM | POA: Diagnosis present

## 2019-08-05 DIAGNOSIS — I498 Other specified cardiac arrhythmias: Secondary | ICD-10-CM | POA: Diagnosis not present

## 2019-08-05 DIAGNOSIS — K4090 Unilateral inguinal hernia, without obstruction or gangrene, not specified as recurrent: Secondary | ICD-10-CM | POA: Diagnosis present

## 2019-08-05 DIAGNOSIS — Z955 Presence of coronary angioplasty implant and graft: Secondary | ICD-10-CM | POA: Diagnosis not present

## 2019-08-05 DIAGNOSIS — I480 Paroxysmal atrial fibrillation: Secondary | ICD-10-CM | POA: Diagnosis not present

## 2019-08-05 DIAGNOSIS — A419 Sepsis, unspecified organism: Secondary | ICD-10-CM | POA: Diagnosis not present

## 2019-08-05 DIAGNOSIS — K831 Obstruction of bile duct: Secondary | ICD-10-CM | POA: Diagnosis present

## 2019-08-05 DIAGNOSIS — Z85820 Personal history of malignant melanoma of skin: Secondary | ICD-10-CM | POA: Diagnosis not present

## 2019-08-05 DIAGNOSIS — K8309 Other cholangitis: Secondary | ICD-10-CM | POA: Diagnosis not present

## 2019-08-05 DIAGNOSIS — R001 Bradycardia, unspecified: Secondary | ICD-10-CM | POA: Diagnosis not present

## 2019-08-05 DIAGNOSIS — Z7982 Long term (current) use of aspirin: Secondary | ICD-10-CM | POA: Diagnosis not present

## 2019-08-05 DIAGNOSIS — E785 Hyperlipidemia, unspecified: Secondary | ICD-10-CM | POA: Diagnosis present

## 2019-08-05 DIAGNOSIS — B9620 Unspecified Escherichia coli [E. coli] as the cause of diseases classified elsewhere: Secondary | ICD-10-CM | POA: Diagnosis present

## 2019-08-07 LAB — CULTURE, BLOOD (ROUTINE X 2)
Special Requests: ADEQUATE
Special Requests: ADEQUATE

## 2019-08-08 DIAGNOSIS — Z792 Long term (current) use of antibiotics: Secondary | ICD-10-CM | POA: Diagnosis not present

## 2019-08-08 DIAGNOSIS — K8309 Other cholangitis: Secondary | ICD-10-CM | POA: Diagnosis not present

## 2019-08-08 DIAGNOSIS — B9620 Unspecified Escherichia coli [E. coli] as the cause of diseases classified elsewhere: Secondary | ICD-10-CM | POA: Diagnosis not present

## 2019-08-08 DIAGNOSIS — Z4801 Encounter for change or removal of surgical wound dressing: Secondary | ICD-10-CM | POA: Diagnosis not present

## 2019-08-08 DIAGNOSIS — Z9181 History of falling: Secondary | ICD-10-CM | POA: Diagnosis not present

## 2019-08-08 DIAGNOSIS — I2510 Atherosclerotic heart disease of native coronary artery without angina pectoris: Secondary | ICD-10-CM | POA: Diagnosis not present

## 2019-08-08 DIAGNOSIS — G4733 Obstructive sleep apnea (adult) (pediatric): Secondary | ICD-10-CM | POA: Diagnosis not present

## 2019-08-08 DIAGNOSIS — Z0001 Encounter for general adult medical examination with abnormal findings: Secondary | ICD-10-CM | POA: Diagnosis not present

## 2019-08-08 DIAGNOSIS — N400 Enlarged prostate without lower urinary tract symptoms: Secondary | ICD-10-CM | POA: Diagnosis not present

## 2019-08-08 DIAGNOSIS — I10 Essential (primary) hypertension: Secondary | ICD-10-CM | POA: Diagnosis not present

## 2019-08-08 DIAGNOSIS — I739 Peripheral vascular disease, unspecified: Secondary | ICD-10-CM | POA: Diagnosis not present

## 2019-08-08 DIAGNOSIS — B961 Klebsiella pneumoniae [K. pneumoniae] as the cause of diseases classified elsewhere: Secondary | ICD-10-CM | POA: Diagnosis not present

## 2019-08-08 DIAGNOSIS — D698 Other specified hemorrhagic conditions: Secondary | ICD-10-CM | POA: Diagnosis not present

## 2019-08-08 DIAGNOSIS — Z87891 Personal history of nicotine dependence: Secondary | ICD-10-CM | POA: Diagnosis not present

## 2019-08-08 DIAGNOSIS — K449 Diaphragmatic hernia without obstruction or gangrene: Secondary | ICD-10-CM | POA: Diagnosis not present

## 2019-08-08 DIAGNOSIS — Z85828 Personal history of other malignant neoplasm of skin: Secondary | ICD-10-CM | POA: Diagnosis not present

## 2019-08-08 DIAGNOSIS — I480 Paroxysmal atrial fibrillation: Secondary | ICD-10-CM | POA: Diagnosis not present

## 2019-08-08 DIAGNOSIS — Z452 Encounter for adjustment and management of vascular access device: Secondary | ICD-10-CM | POA: Diagnosis not present

## 2019-08-08 DIAGNOSIS — E785 Hyperlipidemia, unspecified: Secondary | ICD-10-CM | POA: Diagnosis not present

## 2019-08-08 DIAGNOSIS — R7881 Bacteremia: Secondary | ICD-10-CM | POA: Diagnosis not present

## 2019-08-08 DIAGNOSIS — D696 Thrombocytopenia, unspecified: Secondary | ICD-10-CM | POA: Diagnosis not present

## 2019-08-08 DIAGNOSIS — Z7982 Long term (current) use of aspirin: Secondary | ICD-10-CM | POA: Diagnosis not present

## 2019-08-10 DIAGNOSIS — I10 Essential (primary) hypertension: Secondary | ICD-10-CM | POA: Diagnosis not present

## 2019-08-10 DIAGNOSIS — B961 Klebsiella pneumoniae [K. pneumoniae] as the cause of diseases classified elsewhere: Secondary | ICD-10-CM | POA: Diagnosis not present

## 2019-08-10 DIAGNOSIS — B9620 Unspecified Escherichia coli [E. coli] as the cause of diseases classified elsewhere: Secondary | ICD-10-CM | POA: Diagnosis not present

## 2019-08-10 DIAGNOSIS — Z4801 Encounter for change or removal of surgical wound dressing: Secondary | ICD-10-CM | POA: Diagnosis not present

## 2019-08-10 DIAGNOSIS — K8309 Other cholangitis: Secondary | ICD-10-CM | POA: Diagnosis not present

## 2019-08-10 DIAGNOSIS — R7881 Bacteremia: Secondary | ICD-10-CM | POA: Diagnosis not present

## 2019-08-12 ENCOUNTER — Telehealth (INDEPENDENT_AMBULATORY_CARE_PROVIDER_SITE_OTHER): Payer: Self-pay | Admitting: Internal Medicine

## 2019-08-12 NOTE — Telephone Encounter (Signed)
Wife called the office stated patient has an office visit scheduled for 3/23 - stated he will be under home healthcare at that time - he is on an antibiotic and has a picc line - thinks he needs to reschedule - please advise - [phone]

## 2019-08-12 NOTE — Telephone Encounter (Signed)
Per Dr.Rehman the appointment should be changed.

## 2019-08-14 DIAGNOSIS — Z4801 Encounter for change or removal of surgical wound dressing: Secondary | ICD-10-CM | POA: Diagnosis not present

## 2019-08-14 DIAGNOSIS — R7881 Bacteremia: Secondary | ICD-10-CM | POA: Diagnosis not present

## 2019-08-14 DIAGNOSIS — K8309 Other cholangitis: Secondary | ICD-10-CM | POA: Diagnosis not present

## 2019-08-14 DIAGNOSIS — B961 Klebsiella pneumoniae [K. pneumoniae] as the cause of diseases classified elsewhere: Secondary | ICD-10-CM | POA: Diagnosis not present

## 2019-08-14 DIAGNOSIS — B9620 Unspecified Escherichia coli [E. coli] as the cause of diseases classified elsewhere: Secondary | ICD-10-CM | POA: Diagnosis not present

## 2019-08-14 DIAGNOSIS — I10 Essential (primary) hypertension: Secondary | ICD-10-CM | POA: Diagnosis not present

## 2019-08-18 ENCOUNTER — Ambulatory Visit (INDEPENDENT_AMBULATORY_CARE_PROVIDER_SITE_OTHER): Payer: Medicare Other | Admitting: Internal Medicine

## 2019-08-18 DIAGNOSIS — K8309 Other cholangitis: Secondary | ICD-10-CM | POA: Diagnosis not present

## 2019-08-18 DIAGNOSIS — I10 Essential (primary) hypertension: Secondary | ICD-10-CM | POA: Diagnosis not present

## 2019-08-18 DIAGNOSIS — B9620 Unspecified Escherichia coli [E. coli] as the cause of diseases classified elsewhere: Secondary | ICD-10-CM | POA: Diagnosis not present

## 2019-08-18 DIAGNOSIS — Z4801 Encounter for change or removal of surgical wound dressing: Secondary | ICD-10-CM | POA: Diagnosis not present

## 2019-08-18 DIAGNOSIS — R7881 Bacteremia: Secondary | ICD-10-CM | POA: Diagnosis not present

## 2019-08-18 DIAGNOSIS — B961 Klebsiella pneumoniae [K. pneumoniae] as the cause of diseases classified elsewhere: Secondary | ICD-10-CM | POA: Diagnosis not present

## 2019-08-18 DIAGNOSIS — K839 Disease of biliary tract, unspecified: Secondary | ICD-10-CM | POA: Diagnosis not present

## 2019-08-18 NOTE — Telephone Encounter (Signed)
Would you ask Dr [name] when he would like to see patient again - patient & spouse came into office - thanks

## 2019-08-19 NOTE — Telephone Encounter (Signed)
As to when he wants to see that patient , we will have to ask Dr.Rehman,

## 2019-08-21 DIAGNOSIS — B961 Klebsiella pneumoniae [K. pneumoniae] as the cause of diseases classified elsewhere: Secondary | ICD-10-CM | POA: Diagnosis not present

## 2019-08-21 DIAGNOSIS — K8309 Other cholangitis: Secondary | ICD-10-CM | POA: Diagnosis not present

## 2019-08-21 DIAGNOSIS — I10 Essential (primary) hypertension: Secondary | ICD-10-CM | POA: Diagnosis not present

## 2019-08-21 DIAGNOSIS — Z4801 Encounter for change or removal of surgical wound dressing: Secondary | ICD-10-CM | POA: Diagnosis not present

## 2019-08-21 DIAGNOSIS — B9620 Unspecified Escherichia coli [E. coli] as the cause of diseases classified elsewhere: Secondary | ICD-10-CM | POA: Diagnosis not present

## 2019-08-21 DIAGNOSIS — R7881 Bacteremia: Secondary | ICD-10-CM | POA: Diagnosis not present

## 2019-08-24 DIAGNOSIS — I2510 Atherosclerotic heart disease of native coronary artery without angina pectoris: Secondary | ICD-10-CM | POA: Diagnosis not present

## 2019-08-24 DIAGNOSIS — K831 Obstruction of bile duct: Secondary | ICD-10-CM | POA: Diagnosis not present

## 2019-08-24 DIAGNOSIS — K8309 Other cholangitis: Secondary | ICD-10-CM | POA: Diagnosis not present

## 2019-08-24 DIAGNOSIS — I10 Essential (primary) hypertension: Secondary | ICD-10-CM | POA: Diagnosis not present

## 2019-08-31 DIAGNOSIS — K8309 Other cholangitis: Secondary | ICD-10-CM | POA: Diagnosis not present

## 2019-08-31 DIAGNOSIS — L723 Sebaceous cyst: Secondary | ICD-10-CM | POA: Diagnosis not present

## 2019-09-07 DIAGNOSIS — I10 Essential (primary) hypertension: Secondary | ICD-10-CM | POA: Diagnosis not present

## 2019-09-11 DIAGNOSIS — K8031 Calculus of bile duct with cholangitis, unspecified, with obstruction: Secondary | ICD-10-CM | POA: Diagnosis not present

## 2019-09-11 DIAGNOSIS — D649 Anemia, unspecified: Secondary | ICD-10-CM | POA: Diagnosis not present

## 2019-09-11 DIAGNOSIS — F5101 Primary insomnia: Secondary | ICD-10-CM | POA: Diagnosis not present

## 2019-09-11 DIAGNOSIS — D696 Thrombocytopenia, unspecified: Secondary | ICD-10-CM | POA: Diagnosis not present

## 2019-09-11 DIAGNOSIS — N400 Enlarged prostate without lower urinary tract symptoms: Secondary | ICD-10-CM | POA: Diagnosis not present

## 2019-09-11 DIAGNOSIS — E782 Mixed hyperlipidemia: Secondary | ICD-10-CM | POA: Diagnosis not present

## 2019-09-11 DIAGNOSIS — K219 Gastro-esophageal reflux disease without esophagitis: Secondary | ICD-10-CM | POA: Diagnosis not present

## 2019-09-18 DIAGNOSIS — B961 Klebsiella pneumoniae [K. pneumoniae] as the cause of diseases classified elsewhere: Secondary | ICD-10-CM | POA: Diagnosis not present

## 2019-09-18 DIAGNOSIS — K8309 Other cholangitis: Secondary | ICD-10-CM | POA: Diagnosis not present

## 2019-09-18 DIAGNOSIS — B9620 Unspecified Escherichia coli [E. coli] as the cause of diseases classified elsewhere: Secondary | ICD-10-CM | POA: Diagnosis not present

## 2019-09-18 DIAGNOSIS — Z792 Long term (current) use of antibiotics: Secondary | ICD-10-CM | POA: Diagnosis not present

## 2019-09-18 DIAGNOSIS — K4090 Unilateral inguinal hernia, without obstruction or gangrene, not specified as recurrent: Secondary | ICD-10-CM | POA: Diagnosis not present

## 2019-09-18 DIAGNOSIS — R7881 Bacteremia: Secondary | ICD-10-CM | POA: Diagnosis not present

## 2019-09-18 DIAGNOSIS — K831 Obstruction of bile duct: Secondary | ICD-10-CM | POA: Diagnosis not present

## 2019-09-21 DIAGNOSIS — K831 Obstruction of bile duct: Secondary | ICD-10-CM | POA: Diagnosis not present

## 2019-09-21 DIAGNOSIS — K8309 Other cholangitis: Secondary | ICD-10-CM | POA: Diagnosis not present

## 2019-10-16 DIAGNOSIS — K8031 Calculus of bile duct with cholangitis, unspecified, with obstruction: Secondary | ICD-10-CM | POA: Diagnosis not present

## 2019-10-16 DIAGNOSIS — K219 Gastro-esophageal reflux disease without esophagitis: Secondary | ICD-10-CM | POA: Diagnosis not present

## 2019-10-16 DIAGNOSIS — E782 Mixed hyperlipidemia: Secondary | ICD-10-CM | POA: Diagnosis not present

## 2019-10-16 DIAGNOSIS — N400 Enlarged prostate without lower urinary tract symptoms: Secondary | ICD-10-CM | POA: Diagnosis not present

## 2019-10-16 DIAGNOSIS — D696 Thrombocytopenia, unspecified: Secondary | ICD-10-CM | POA: Diagnosis not present

## 2019-10-16 DIAGNOSIS — D649 Anemia, unspecified: Secondary | ICD-10-CM | POA: Diagnosis not present

## 2019-10-16 DIAGNOSIS — F5101 Primary insomnia: Secondary | ICD-10-CM | POA: Diagnosis not present

## 2019-10-17 DIAGNOSIS — R509 Fever, unspecified: Secondary | ICD-10-CM | POA: Diagnosis not present

## 2019-10-17 DIAGNOSIS — R110 Nausea: Secondary | ICD-10-CM | POA: Diagnosis not present

## 2019-10-17 DIAGNOSIS — Z955 Presence of coronary angioplasty implant and graft: Secondary | ICD-10-CM | POA: Diagnosis not present

## 2019-10-17 DIAGNOSIS — E785 Hyperlipidemia, unspecified: Secondary | ICD-10-CM | POA: Diagnosis not present

## 2019-10-17 DIAGNOSIS — R531 Weakness: Secondary | ICD-10-CM | POA: Diagnosis not present

## 2019-10-17 DIAGNOSIS — I2510 Atherosclerotic heart disease of native coronary artery without angina pectoris: Secondary | ICD-10-CM | POA: Diagnosis not present

## 2019-10-17 DIAGNOSIS — Z7982 Long term (current) use of aspirin: Secondary | ICD-10-CM | POA: Diagnosis not present

## 2019-10-17 DIAGNOSIS — Z20822 Contact with and (suspected) exposure to covid-19: Secondary | ICD-10-CM | POA: Diagnosis not present

## 2019-10-17 DIAGNOSIS — I10 Essential (primary) hypertension: Secondary | ICD-10-CM | POA: Diagnosis not present

## 2019-10-21 DIAGNOSIS — K8309 Other cholangitis: Secondary | ICD-10-CM | POA: Diagnosis not present

## 2019-10-21 DIAGNOSIS — K831 Obstruction of bile duct: Secondary | ICD-10-CM | POA: Diagnosis not present

## 2019-10-21 DIAGNOSIS — T85590A Other mechanical complication of bile duct prosthesis, initial encounter: Secondary | ICD-10-CM | POA: Diagnosis not present

## 2019-10-22 DIAGNOSIS — K831 Obstruction of bile duct: Secondary | ICD-10-CM | POA: Diagnosis present

## 2019-10-22 DIAGNOSIS — I4891 Unspecified atrial fibrillation: Secondary | ICD-10-CM | POA: Diagnosis not present

## 2019-10-22 DIAGNOSIS — Z20822 Contact with and (suspected) exposure to covid-19: Secondary | ICD-10-CM | POA: Diagnosis present

## 2019-10-22 DIAGNOSIS — I10 Essential (primary) hypertension: Secondary | ICD-10-CM | POA: Diagnosis present

## 2019-10-22 DIAGNOSIS — Z9049 Acquired absence of other specified parts of digestive tract: Secondary | ICD-10-CM | POA: Diagnosis not present

## 2019-10-22 DIAGNOSIS — I739 Peripheral vascular disease, unspecified: Secondary | ICD-10-CM | POA: Diagnosis present

## 2019-10-22 DIAGNOSIS — Z85820 Personal history of malignant melanoma of skin: Secondary | ICD-10-CM | POA: Diagnosis not present

## 2019-10-22 DIAGNOSIS — Z7982 Long term (current) use of aspirin: Secondary | ICD-10-CM | POA: Diagnosis not present

## 2019-10-22 DIAGNOSIS — K8309 Other cholangitis: Secondary | ICD-10-CM | POA: Diagnosis not present

## 2019-10-22 DIAGNOSIS — Z87891 Personal history of nicotine dependence: Secondary | ICD-10-CM | POA: Diagnosis not present

## 2019-10-22 DIAGNOSIS — N400 Enlarged prostate without lower urinary tract symptoms: Secondary | ICD-10-CM | POA: Diagnosis present

## 2019-10-22 DIAGNOSIS — R509 Fever, unspecified: Secondary | ICD-10-CM | POA: Diagnosis not present

## 2019-10-22 DIAGNOSIS — T85598A Other mechanical complication of other gastrointestinal prosthetic devices, implants and grafts, initial encounter: Secondary | ICD-10-CM | POA: Diagnosis present

## 2019-10-22 DIAGNOSIS — I2510 Atherosclerotic heart disease of native coronary artery without angina pectoris: Secondary | ICD-10-CM | POA: Diagnosis not present

## 2019-10-22 DIAGNOSIS — D696 Thrombocytopenia, unspecified: Secondary | ICD-10-CM | POA: Diagnosis present

## 2019-10-22 DIAGNOSIS — I480 Paroxysmal atrial fibrillation: Secondary | ICD-10-CM | POA: Diagnosis present

## 2019-10-22 DIAGNOSIS — Z955 Presence of coronary angioplasty implant and graft: Secondary | ICD-10-CM | POA: Diagnosis not present

## 2019-10-22 DIAGNOSIS — E785 Hyperlipidemia, unspecified: Secondary | ICD-10-CM | POA: Diagnosis present

## 2019-10-22 DIAGNOSIS — Z91041 Radiographic dye allergy status: Secondary | ICD-10-CM | POA: Diagnosis not present

## 2019-10-22 DIAGNOSIS — Z9861 Coronary angioplasty status: Secondary | ICD-10-CM | POA: Diagnosis not present

## 2019-10-22 DIAGNOSIS — G4733 Obstructive sleep apnea (adult) (pediatric): Secondary | ICD-10-CM | POA: Diagnosis present

## 2019-11-03 ENCOUNTER — Other Ambulatory Visit (INDEPENDENT_AMBULATORY_CARE_PROVIDER_SITE_OTHER): Payer: Self-pay | Admitting: Internal Medicine

## 2019-11-27 ENCOUNTER — Ambulatory Visit: Payer: Medicare Other | Admitting: Cardiology

## 2019-12-16 DIAGNOSIS — Z888 Allergy status to other drugs, medicaments and biological substances status: Secondary | ICD-10-CM | POA: Diagnosis not present

## 2019-12-16 DIAGNOSIS — I2510 Atherosclerotic heart disease of native coronary artery without angina pectoris: Secondary | ICD-10-CM | POA: Diagnosis not present

## 2019-12-16 DIAGNOSIS — Z79899 Other long term (current) drug therapy: Secondary | ICD-10-CM | POA: Diagnosis not present

## 2019-12-16 DIAGNOSIS — K8309 Other cholangitis: Secondary | ICD-10-CM | POA: Diagnosis not present

## 2019-12-16 DIAGNOSIS — Z91041 Radiographic dye allergy status: Secondary | ICD-10-CM | POA: Diagnosis not present

## 2019-12-16 DIAGNOSIS — K831 Obstruction of bile duct: Secondary | ICD-10-CM | POA: Diagnosis not present

## 2019-12-16 DIAGNOSIS — Z20822 Contact with and (suspected) exposure to covid-19: Secondary | ICD-10-CM | POA: Diagnosis not present

## 2019-12-16 DIAGNOSIS — Z7982 Long term (current) use of aspirin: Secondary | ICD-10-CM | POA: Diagnosis not present

## 2019-12-16 DIAGNOSIS — Z9049 Acquired absence of other specified parts of digestive tract: Secondary | ICD-10-CM | POA: Diagnosis not present

## 2019-12-16 DIAGNOSIS — Z955 Presence of coronary angioplasty implant and graft: Secondary | ICD-10-CM | POA: Diagnosis not present

## 2019-12-17 DIAGNOSIS — Z79899 Other long term (current) drug therapy: Secondary | ICD-10-CM | POA: Diagnosis not present

## 2019-12-17 DIAGNOSIS — Z955 Presence of coronary angioplasty implant and graft: Secondary | ICD-10-CM | POA: Diagnosis not present

## 2019-12-17 DIAGNOSIS — Z7982 Long term (current) use of aspirin: Secondary | ICD-10-CM | POA: Diagnosis not present

## 2019-12-17 DIAGNOSIS — K8309 Other cholangitis: Secondary | ICD-10-CM | POA: Diagnosis not present

## 2019-12-17 DIAGNOSIS — K831 Obstruction of bile duct: Secondary | ICD-10-CM | POA: Diagnosis not present

## 2019-12-17 DIAGNOSIS — I2510 Atherosclerotic heart disease of native coronary artery without angina pectoris: Secondary | ICD-10-CM | POA: Diagnosis not present

## 2019-12-17 DIAGNOSIS — Z9049 Acquired absence of other specified parts of digestive tract: Secondary | ICD-10-CM | POA: Diagnosis not present

## 2019-12-23 DIAGNOSIS — K219 Gastro-esophageal reflux disease without esophagitis: Secondary | ICD-10-CM | POA: Diagnosis not present

## 2019-12-23 DIAGNOSIS — F5101 Primary insomnia: Secondary | ICD-10-CM | POA: Diagnosis not present

## 2019-12-23 DIAGNOSIS — N400 Enlarged prostate without lower urinary tract symptoms: Secondary | ICD-10-CM | POA: Diagnosis not present

## 2019-12-23 DIAGNOSIS — D649 Anemia, unspecified: Secondary | ICD-10-CM | POA: Diagnosis not present

## 2019-12-23 DIAGNOSIS — E785 Hyperlipidemia, unspecified: Secondary | ICD-10-CM | POA: Diagnosis not present

## 2019-12-23 DIAGNOSIS — K8031 Calculus of bile duct with cholangitis, unspecified, with obstruction: Secondary | ICD-10-CM | POA: Diagnosis not present

## 2020-01-07 DIAGNOSIS — N400 Enlarged prostate without lower urinary tract symptoms: Secondary | ICD-10-CM | POA: Diagnosis not present

## 2020-01-07 DIAGNOSIS — K8031 Calculus of bile duct with cholangitis, unspecified, with obstruction: Secondary | ICD-10-CM | POA: Diagnosis not present

## 2020-01-07 DIAGNOSIS — E785 Hyperlipidemia, unspecified: Secondary | ICD-10-CM | POA: Diagnosis not present

## 2020-01-07 DIAGNOSIS — K219 Gastro-esophageal reflux disease without esophagitis: Secondary | ICD-10-CM | POA: Diagnosis not present

## 2020-01-07 DIAGNOSIS — D649 Anemia, unspecified: Secondary | ICD-10-CM | POA: Diagnosis not present

## 2020-01-07 DIAGNOSIS — F5101 Primary insomnia: Secondary | ICD-10-CM | POA: Diagnosis not present

## 2020-01-14 DIAGNOSIS — R109 Unspecified abdominal pain: Secondary | ICD-10-CM | POA: Diagnosis not present

## 2020-01-14 DIAGNOSIS — D649 Anemia, unspecified: Secondary | ICD-10-CM | POA: Diagnosis not present

## 2020-01-14 DIAGNOSIS — K219 Gastro-esophageal reflux disease without esophagitis: Secondary | ICD-10-CM | POA: Diagnosis not present

## 2020-01-14 DIAGNOSIS — E782 Mixed hyperlipidemia: Secondary | ICD-10-CM | POA: Diagnosis not present

## 2020-01-14 DIAGNOSIS — L723 Sebaceous cyst: Secondary | ICD-10-CM | POA: Diagnosis not present

## 2020-01-14 DIAGNOSIS — F5101 Primary insomnia: Secondary | ICD-10-CM | POA: Diagnosis not present

## 2020-01-14 DIAGNOSIS — K8031 Calculus of bile duct with cholangitis, unspecified, with obstruction: Secondary | ICD-10-CM | POA: Diagnosis not present

## 2020-01-14 DIAGNOSIS — K8051 Calculus of bile duct without cholangitis or cholecystitis with obstruction: Secondary | ICD-10-CM | POA: Diagnosis not present

## 2020-01-14 DIAGNOSIS — D692 Other nonthrombocytopenic purpura: Secondary | ICD-10-CM | POA: Diagnosis not present

## 2020-01-14 DIAGNOSIS — I480 Paroxysmal atrial fibrillation: Secondary | ICD-10-CM | POA: Diagnosis not present

## 2020-01-14 DIAGNOSIS — I2510 Atherosclerotic heart disease of native coronary artery without angina pectoris: Secondary | ICD-10-CM | POA: Diagnosis not present

## 2020-01-14 DIAGNOSIS — R112 Nausea with vomiting, unspecified: Secondary | ICD-10-CM | POA: Diagnosis not present

## 2020-01-19 ENCOUNTER — Other Ambulatory Visit (INDEPENDENT_AMBULATORY_CARE_PROVIDER_SITE_OTHER): Payer: Self-pay | Admitting: *Deleted

## 2020-01-19 ENCOUNTER — Encounter (INDEPENDENT_AMBULATORY_CARE_PROVIDER_SITE_OTHER): Payer: Self-pay | Admitting: *Deleted

## 2020-01-19 DIAGNOSIS — R1310 Dysphagia, unspecified: Secondary | ICD-10-CM

## 2020-01-20 DIAGNOSIS — N400 Enlarged prostate without lower urinary tract symptoms: Secondary | ICD-10-CM | POA: Diagnosis not present

## 2020-01-20 DIAGNOSIS — L723 Sebaceous cyst: Secondary | ICD-10-CM | POA: Diagnosis not present

## 2020-01-20 DIAGNOSIS — I2510 Atherosclerotic heart disease of native coronary artery without angina pectoris: Secondary | ICD-10-CM | POA: Diagnosis not present

## 2020-01-20 DIAGNOSIS — E782 Mixed hyperlipidemia: Secondary | ICD-10-CM | POA: Diagnosis not present

## 2020-01-20 DIAGNOSIS — D649 Anemia, unspecified: Secondary | ICD-10-CM | POA: Diagnosis not present

## 2020-01-20 DIAGNOSIS — I480 Paroxysmal atrial fibrillation: Secondary | ICD-10-CM | POA: Diagnosis not present

## 2020-01-20 DIAGNOSIS — R770 Abnormality of albumin: Secondary | ICD-10-CM | POA: Diagnosis not present

## 2020-01-20 DIAGNOSIS — D696 Thrombocytopenia, unspecified: Secondary | ICD-10-CM | POA: Diagnosis not present

## 2020-01-20 DIAGNOSIS — K8031 Calculus of bile duct with cholangitis, unspecified, with obstruction: Secondary | ICD-10-CM | POA: Diagnosis not present

## 2020-01-20 DIAGNOSIS — Z0001 Encounter for general adult medical examination with abnormal findings: Secondary | ICD-10-CM | POA: Diagnosis not present

## 2020-01-20 DIAGNOSIS — K219 Gastro-esophageal reflux disease without esophagitis: Secondary | ICD-10-CM | POA: Diagnosis not present

## 2020-01-20 DIAGNOSIS — I429 Cardiomyopathy, unspecified: Secondary | ICD-10-CM | POA: Diagnosis not present

## 2020-01-20 DIAGNOSIS — K4091 Unilateral inguinal hernia, without obstruction or gangrene, recurrent: Secondary | ICD-10-CM | POA: Diagnosis not present

## 2020-01-26 ENCOUNTER — Encounter: Payer: Self-pay | Admitting: Cardiology

## 2020-01-26 ENCOUNTER — Ambulatory Visit (INDEPENDENT_AMBULATORY_CARE_PROVIDER_SITE_OTHER): Payer: Medicare Other | Admitting: Cardiology

## 2020-01-26 VITALS — BP 100/58 | HR 60 | Ht 70.0 in | Wt 175.0 lb

## 2020-01-26 DIAGNOSIS — I480 Paroxysmal atrial fibrillation: Secondary | ICD-10-CM | POA: Diagnosis not present

## 2020-01-26 DIAGNOSIS — E782 Mixed hyperlipidemia: Secondary | ICD-10-CM

## 2020-01-26 DIAGNOSIS — I25119 Atherosclerotic heart disease of native coronary artery with unspecified angina pectoris: Secondary | ICD-10-CM

## 2020-01-26 NOTE — [place] Notes (Signed)
Cardiology Office Note  Date: 01/26/2020   ID: Alexander Burton 05/15/1941, MRN [id number]  PCP:  Celene Squibb, MD  Cardiologist:  Rozann Lesches, MD Electrophysiologist:  None   Chief Complaint  Patient presents with  . Cardiac follow-up    History of Present Illness: Alexander Burton is an 79 y.o. male last seen in January.  He presents for a routine visit.  From a cardiac perspective he does not describe any angina symptoms, no increasing sense of palpitations, no dizziness or syncope.  CHA2DS2-VASc score is 3-4.  He has declined DOAC so far.  Part of this decision is due to the fact that he has recurring cholangitis and requires intermittent placement of PBD at [facility], not necessarily a predictable pattern.  I reviewed his medications which are outlined below.  Past Medical History:  Diagnosis Date  . BPH (benign prostatic hyperplasia)   . CAD (coronary artery disease)    DES to LAD 06/2008  . Colon polyps   . COPD (chronic obstructive pulmonary disease) ([place])   . Gastritis   . Goiter   . 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 History of syncope January 2010   PVC's and14 beats NSVT as well as atrial afib  . Hyperlipidemia   . Melanoma ([place])   . Osteoarthritis   . PAF (paroxysmal atrial fibrillation) ([place])   . Prostate CA ([place])   . Sleep apnea 04/02/2005    Past Surgical History:  Procedure Laterality Date  . CARDIAC CATHETERIZATION  Feb 2010   post DES to LAD ,3.5 X 33 mm Cypher stent,dilated up to 3.8 mm 80 to90% stenosis btwn first and second diag branc w.moderate disease EF 50%  . CHOLECYSTECTOMY     Complicated by rupture of common bile duct and other vascular structures. Has now had multiple issues with chronic hepatic abscess. Currently has indwelling drain  . COLONOSCOPY  08/16/2011   Procedure: COLONOSCOPY;  Surgeon: Rogene Houston, MD;  [facility];  Service: Endoscopy;  Laterality: N/A;  1200  . ESOPHAGEAL  DILATION N/A 01/20/2015   Procedure: ESOPHAGEAL DILATION;  Surgeon: Rogene Houston, MD;  [facility];  Service: Endoscopy;  Laterality: N/A;  . ESOPHAGOGASTRODUODENOSCOPY N/A 01/20/2015   Procedure: ESOPHAGOGASTRODUODENOSCOPY (EGD);  Surgeon: Rogene Houston, MD;  [facility];  Service: Endoscopy;  Laterality: N/A;  1200  . LEA DOPPLER  07/28/2008   NORMAL RGT GROIN DUPLEX DOPPLER  . liver stent    . LIVER SURGERY  09-03/14   @ [facility]  . Melanoma resection      Current Outpatient Medications  Medication Sig Dispense Refill  . amoxicillin-clavulanate (AUGMENTIN) 875-125 MG tablet Take 1 tablet by mouth as directed. Patient will take 1 tablet twice a day for 1 month; current regimen is Bactrim - will start Augmentin in May    . aspirin EC 81 MG tablet Take 81 mg by mouth every evening.    . ciprofloxacin (CIPRO) 500 MG tablet Take 1 tablet by mouth as directed. Patient will take 1 tablet twice a day for 1 month; current regimen is Bactrim - will start Cipro in April    . cycloSPORINE (RESTASIS) 0.05 % ophthalmic emulsion Place 1 drop into both eyes 2 (two) times daily.     . finasteride (PROSCAR) 5 MG tablet Take 5 mg by mouth every morning.     . metoprolol tartrate (LOPRESSOR) 25 MG tablet TAKE 1/2  TABLET(12.5 MG) BY MOUTH TWICE DAILY (Patient taking differently: Take 12.5 mg by mouth 2 (two) times daily. ) 90 tablet 3  . Multiple Vitamin (MULITIVITAMIN WITH MINERALS) TABS Take 1 tablet by mouth every morning.     . Multiple Vitamins-Minerals (PRESERVISION AREDS) CAPS Take 1 capsule by mouth in the morning and at bedtime.     . naproxen sodium (ALEVE) 220 MG tablet Take 220 mg by mouth 2 (two) times daily as needed.     . nitroGLYCERIN (NITROSTAT) 0.4 MG SL tablet Place 0.4 mg under the tongue every 5 (five) minutes as needed for chest pain.    Marland Kitchen Oxymetazoline HCl (AFRIN NASAL SPRAY NA) Place 2 sprays into the nose 2 (two) times daily as needed.     . pravastatin  (PRAVACHOL) 20 MG tablet TAKE 1 TABLET(20 MG) BY MOUTH DAILY (Patient taking differently: Take 20 mg by mouth daily. ) 90 tablet 3  . Probiotic Product (PROBIOTIC DAILY PO) Take 1 tablet by mouth daily.    . sildenafil (REVATIO) 20 MG tablet Take 1-5 tablets by mouth daily as needed.    . sodium chloride flush (NS) 0.9 % SOLN Place 10 mLs into feeding tube every 8 (eight) hours.    Marland Kitchen sulfamethoxazole-trimethoprim (BACTRIM DS) 800-160 MG tablet Take 1 tablet by mouth 2 (two) times daily.    . tamsulosin (FLOMAX) 0.4 MG CAPS capsule Take 0.4 mg by mouth in the morning and at bedtime.   3  . ursodiol (ACTIGALL) 250 MG tablet Take 1 tablet (250 mg total) by mouth in the morning and at bedtime. 90 tablet 1   No current facility-administered medications for this visit.   Allergies:  Contrast media [iodinated diagnostic agents], Metrizamide, and Other   ROS:   No orthopnea or PND.  Physical Exam: VS:  BP (!) 100/58   Pulse 60   Ht 5\' 10"  (1.778 m)   Wt 175 lb (79.4 kg)   SpO2 98%   BMI 25.11 kg/m , BMI Body mass index is 25.11 kg/m.  Wt Readings from Last 3 Encounters:  01/26/20 175 lb (79.4 kg)  08/03/19 170 lb (77.1 kg)  06/24/19 182 lb (82.6 kg)    General:  Elderly male, appears comfortable at rest. HEENT: Conjunctiva and lids normal, wearing a mask. Neck: Supple, no elevated JVP or carotid bruits, no thyromegaly. Lungs: Clear to auscultation, nonlabored breathing at rest. Cardiac: Regular rate and rhythm, no S3, soft systolic murmur, no pericardial rub. Extremities: No pitting edema, distal pulses 2+.  ECG:  An ECG dated 08/03/2019 was personally reviewed today and demonstrated:  Sinus rhythm with low voltage, nonspecific T wave changes.  Recent Labwork: 08/03/2019: ALT 57; AST 60; BUN 18; Creatinine, Ser 1.29; Potassium 4.4; Sodium 137 08/04/2019: Hemoglobin 11.4; Platelets 51   Other Studies Reviewed Today:  Echocardiogram 02/22/2017: Study Conclusions  - Left ventricle: The  cavity size was normal. Wall thickness was increased in a pattern of mild LVH. Systolic function was normal. The estimated ejection fraction was in the range of 55% to 60%. Wall motion was normal; there were no regional wall motion abnormalities. Doppler parameters are consistent with abnormal left ventricular relaxation (grade 1 diastolic dysfunction). - Aortic valve: Valve area (VTI): 2.91 cm^2. Valve area (Vmax): 2.25 cm^2. - Left atrium: The atrium was mildly to moderately dilated. - Technically adequate study.  Assessment and Plan:  1.  Paroxysmal atrial fibrillation with CHA2DS2-VASc score of 3-4.  As noted above, we continue on aspirin at this  point.  He has been in sinus rhythm on the last few checks and continues on Lopressor.  2.  CAD status post DES to the LAD in 2010.  No active angina at this time.  Continue observation on aspirin, Lopressor, Pravachol, and as needed nitroglycerin.  Medication Adjustments/Labs and Tests Ordered: Current medicines are reviewed at length with the patient today.  Concerns regarding medicines are outlined above.   Tests Ordered: No orders of the defined types were placed in this encounter.   Medication Changes: No orders of the defined types were placed in this encounter.   Disposition:  Follow up 6 months in the [place] office.  Signed, Satira Sark, MD, [facility] 01/26/2020 10:37 AM    [place] [facility] [place], [place], [place] [address] Phone: [phone]; Fax: [phone]

## 2020-01-26 NOTE — Patient Instructions (Addendum)

## 2020-01-29 ENCOUNTER — Other Ambulatory Visit (INDEPENDENT_AMBULATORY_CARE_PROVIDER_SITE_OTHER): Payer: Self-pay | Admitting: Gastroenterology

## 2020-02-05 DIAGNOSIS — K831 Obstruction of bile duct: Secondary | ICD-10-CM | POA: Diagnosis not present

## 2020-02-05 DIAGNOSIS — B9620 Unspecified Escherichia coli [E. coli] as the cause of diseases classified elsewhere: Secondary | ICD-10-CM | POA: Diagnosis not present

## 2020-02-05 DIAGNOSIS — Z792 Long term (current) use of antibiotics: Secondary | ICD-10-CM | POA: Diagnosis not present

## 2020-02-05 DIAGNOSIS — R7881 Bacteremia: Secondary | ICD-10-CM | POA: Diagnosis not present

## 2020-02-05 DIAGNOSIS — K8309 Other cholangitis: Secondary | ICD-10-CM | POA: Diagnosis not present

## 2020-02-09 ENCOUNTER — Other Ambulatory Visit (HOSPITAL_COMMUNITY)
Admission: RE | Admit: 2020-02-09 | Discharge: 2020-02-09 | Disposition: A | Discharge status: Home / Self Care | Payer: Medicare Other | Source: Ambulatory Visit | Attending: Internal Medicine | Admitting: Internal Medicine

## 2020-02-09 ENCOUNTER — Other Ambulatory Visit: Payer: Self-pay

## 2020-02-09 DIAGNOSIS — Z01812 Encounter for preprocedural laboratory examination: Secondary | ICD-10-CM | POA: Diagnosis not present

## 2020-02-09 DIAGNOSIS — Z20822 Contact with and (suspected) exposure to covid-19: Secondary | ICD-10-CM | POA: Diagnosis not present

## 2020-02-10 LAB — SARS CORONAVIRUS 2 (TAT 6-24 HRS): SARS Coronavirus 2: NEGATIVE

## 2020-02-11 ENCOUNTER — Encounter (HOSPITAL_COMMUNITY): Payer: Self-pay | Admitting: Internal Medicine

## 2020-02-11 ENCOUNTER — Ambulatory Visit (HOSPITAL_COMMUNITY)
Admission: RE | Admit: 2020-02-11 | Discharge: 2020-02-11 | Disposition: A | Discharge status: Home / Self Care | Payer: Medicare Other | Attending: Internal Medicine | Admitting: Internal Medicine

## 2020-02-11 ENCOUNTER — Encounter (HOSPITAL_COMMUNITY)
Admission: RE | Disposition: A | Discharge status: Home / Self Care | Payer: Self-pay | Source: Home / Self Care | Attending: Internal Medicine

## 2020-02-11 ENCOUNTER — Other Ambulatory Visit: Payer: Self-pay

## 2020-02-11 DIAGNOSIS — K219 Gastro-esophageal reflux disease without esophagitis: Secondary | ICD-10-CM | POA: Diagnosis not present

## 2020-02-11 DIAGNOSIS — K766 Portal hypertension: Secondary | ICD-10-CM | POA: Diagnosis not present

## 2020-02-11 DIAGNOSIS — J449 Chronic obstructive pulmonary disease, unspecified: Secondary | ICD-10-CM | POA: Insufficient documentation

## 2020-02-11 DIAGNOSIS — E785 Hyperlipidemia, unspecified: Secondary | ICD-10-CM | POA: Insufficient documentation

## 2020-02-11 DIAGNOSIS — Z87891 Personal history of nicotine dependence: Secondary | ICD-10-CM | POA: Insufficient documentation

## 2020-02-11 DIAGNOSIS — I480 Paroxysmal atrial fibrillation: Secondary | ICD-10-CM | POA: Diagnosis not present

## 2020-02-11 DIAGNOSIS — Z85820 Personal history of malignant melanoma of skin: Secondary | ICD-10-CM | POA: Diagnosis not present

## 2020-02-11 DIAGNOSIS — R1314 Dysphagia, pharyngoesophageal phase: Secondary | ICD-10-CM | POA: Insufficient documentation

## 2020-02-11 DIAGNOSIS — Z8546 Personal history of malignant neoplasm of prostate: Secondary | ICD-10-CM | POA: Diagnosis not present

## 2020-02-11 DIAGNOSIS — Z7982 Long term (current) use of aspirin: Secondary | ICD-10-CM | POA: Insufficient documentation

## 2020-02-11 DIAGNOSIS — N400 Enlarged prostate without lower urinary tract symptoms: Secondary | ICD-10-CM | POA: Insufficient documentation

## 2020-02-11 DIAGNOSIS — G4730 Sleep apnea, unspecified: Secondary | ICD-10-CM | POA: Insufficient documentation

## 2020-02-11 DIAGNOSIS — K222 Esophageal obstruction: Secondary | ICD-10-CM | POA: Diagnosis not present

## 2020-02-11 DIAGNOSIS — Z955 Presence of coronary angioplasty implant and graft: Secondary | ICD-10-CM | POA: Insufficient documentation

## 2020-02-11 DIAGNOSIS — I10 Essential (primary) hypertension: Secondary | ICD-10-CM | POA: Diagnosis not present

## 2020-02-11 DIAGNOSIS — K3189 Other diseases of stomach and duodenum: Secondary | ICD-10-CM | POA: Insufficient documentation

## 2020-02-11 DIAGNOSIS — I2510 Atherosclerotic heart disease of native coronary artery without angina pectoris: Secondary | ICD-10-CM | POA: Insufficient documentation

## 2020-02-11 DIAGNOSIS — R1310 Dysphagia, unspecified: Secondary | ICD-10-CM

## 2020-02-11 DIAGNOSIS — Z79899 Other long term (current) drug therapy: Secondary | ICD-10-CM | POA: Insufficient documentation

## 2020-02-11 DIAGNOSIS — E7849 Other hyperlipidemia: Secondary | ICD-10-CM | POA: Diagnosis not present

## 2020-02-11 HISTORY — PX: ESOPHAGEAL DILATION: SHX303

## 2020-02-11 HISTORY — PX: ESOPHAGOGASTRODUODENOSCOPY: SHX5428

## 2020-02-11 SURGERY — EGD (ESOPHAGOGASTRODUODENOSCOPY)
Anesthesia: Moderate Sedation

## 2020-02-11 MED ORDER — MIDAZOLAM HCL 5 MG/5ML IJ SOLN
INTRAMUSCULAR | Status: DC | PRN
  Administered 2020-02-11: 1 mg via INTRAVENOUS
  Administered 2020-02-11: 2 mg via INTRAVENOUS
  Administered 2020-02-11: 1 mg via INTRAVENOUS

## 2020-02-11 MED ORDER — LIDOCAINE VISCOUS HCL 2 % MT SOLN
OROMUCOSAL | Status: AC
  Filled 2020-02-11: qty 15

## 2020-02-11 MED ORDER — MIDAZOLAM HCL 5 MG/5ML IJ SOLN
INTRAMUSCULAR | Status: AC
  Filled 2020-02-11: qty 10

## 2020-02-11 MED ORDER — SODIUM CHLORIDE 0.9 % IV SOLN: INTRAVENOUS | Status: DC

## 2020-02-11 MED ORDER — MEPERIDINE HCL 50 MG/ML IJ SOLN
INTRAMUSCULAR | Status: AC
  Filled 2020-02-11: qty 1

## 2020-02-11 MED ORDER — MEPERIDINE HCL 50 MG/ML IJ SOLN
INTRAMUSCULAR | Status: DC | PRN
Start: 2020-02-11 — End: 2020-02-11
  Administered 2020-02-11: 20 mg via INTRAVENOUS
  Administered 2020-02-11: 10 mg via INTRAVENOUS

## 2020-02-11 MED ORDER — LIDOCAINE VISCOUS HCL 2 % MT SOLN
OROMUCOSAL | Status: DC | PRN
  Administered 2020-02-11: 5 mL via OROMUCOSAL

## 2020-02-11 NOTE — H&P (Signed)
Alexander Burton is an 79 y.o. male.   Chief Complaint: Patient is here for esophagogastroduodenoscopy with esophageal dilation. HPI: Patient is 79 year old Caucasian male who has history of GERD distal esophageal stricture and esophageal who presents with dysphagia solids started few months ago and getting worse.  He is maintaining his weight.  He feels heartburn is well controlled with PPI. Last EGD with EGD was 5 years ago. Last aspirin dose was 2 days ago.  Past Medical History:  Diagnosis Date  . BPH (benign prostatic hyperplasia)   . CAD (coronary artery disease)    DES to LAD 06/2008  . Colon polyps   . COPD (chronic obstructive pulmonary disease) ([place])   . Gastritis   . Goiter   . 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 History of syncope January 2010   PVC's and14 beats NSVT as well as atrial afib  . Hyperlipidemia   . Melanoma ([place])   . Osteoarthritis   . PAF (paroxysmal atrial fibrillation) ([place])   . Prostate CA ([place])   . Sleep apnea 04/02/2005    Past Surgical History:  Procedure Laterality Date  . CARDIAC CATHETERIZATION  Feb 2010   post DES to LAD ,3.5 X 33 mm Cypher stent,dilated up to 3.8 mm 80 to90% stenosis btwn first and second diag branc w.moderate disease EF 50%  . CHOLECYSTECTOMY     Complicated by rupture of common bile duct and other vascular structures. Has now had multiple issues with chronic hepatic abscess. Currently has indwelling drain  . COLONOSCOPY  08/16/2011   Procedure: COLONOSCOPY;  Surgeon: Rogene Houston, MD;  [facility];  Service: Endoscopy;  Laterality: N/A;  1200  . ESOPHAGEAL DILATION N/A 01/20/2015   Procedure: ESOPHAGEAL DILATION;  Surgeon: Rogene Houston, MD;  [facility];  Service: Endoscopy;  Laterality: N/A;  . ESOPHAGOGASTRODUODENOSCOPY N/A 01/20/2015   Procedure: ESOPHAGOGASTRODUODENOSCOPY (EGD);  Surgeon: Rogene Houston, MD;  [facility];  Service: Endoscopy;   Laterality: N/A;  1200  . LEA DOPPLER  07/28/2008   NORMAL RGT GROIN DUPLEX DOPPLER  . liver stent    . LIVER SURGERY  09-03/14   @ [facility]  . Melanoma resection      Family History  Problem Relation Age of Onset  . Colon cancer Sister    Social History:  reports that he quit smoking about 12 years ago. His smoking use included cigarettes. He has a 75.00 pack-year smoking history. He has never used smokeless tobacco. He reports current alcohol use of about 1.0 standard drink of alcohol per week. He reports that he does not use drugs.  Allergies:  Allergies  Allergen Reactions  . Contrast Media [Iodinated Diagnostic Agents]     Hives, needs pre meds  . Metrizamide Hives    Hives, needs pre meds  . Other Itching and Other (See Comments)    Contrast dye erythema    Medications Prior to Admission  Medication Sig Dispense Refill  . amoxicillin-clavulanate (AUGMENTIN) 875-125 MG tablet Take 1 tablet by mouth 2 (two) times daily. For 30 days  - will start Augmentin in May    . aspirin EC 81 MG tablet Take 81 mg by mouth every evening.    . cycloSPORINE (RESTASIS) 0.05 % ophthalmic emulsion Place 1 drop into both eyes 2 (two) times daily.     . finasteride (PROSCAR) 5 MG tablet Take 5 mg by mouth daily.     Marland Kitchen  metoprolol tartrate (LOPRESSOR) 25 MG tablet TAKE 1/2 TABLET(12.5 MG) BY MOUTH TWICE DAILY (Patient taking differently: Take 12.5 mg by mouth 2 (two) times daily. ) 90 tablet 3  . mirtazapine (REMERON) 7.5 MG tablet Take 7.5 mg by mouth at bedtime.    . Multiple Vitamin (MULITIVITAMIN WITH MINERALS) TABS Take 1 tablet by mouth daily. Centrum Silver men 50+    . Multiple Vitamins-Minerals (PRESERVISION AREDS) CAPS Take 1 capsule by mouth in the morning and at bedtime.     . naproxen sodium (ALEVE) 220 MG tablet Take 220 mg by mouth 2 (two) times daily as needed (pain).     . Oxymetazoline HCl (AFRIN NASAL SPRAY NA) Place 2 sprays into the nose 2 (two) times daily as needed.     .  pantoprazole (PROTONIX) 40 MG tablet Take 40 mg by mouth at bedtime.     . pravastatin (PRAVACHOL) 20 MG tablet TAKE 1 TABLET(20 MG) BY MOUTH DAILY (Patient taking differently: Take 20 mg by mouth daily. ) 90 tablet 3  . Probiotic Product (PROBIOTIC DAILY PO) Take 1 tablet by mouth daily.    . tamsulosin (FLOMAX) 0.4 MG CAPS capsule Take 0.4 mg by mouth in the morning and at bedtime.   3  . ursodiol (ACTIGALL) 250 MG tablet TAKE 1 TABLET(250 MG) BY MOUTH IN THE MORNING AND AT BEDTIME (Patient taking differently: Take 250 mg by mouth 2 (two) times daily. ) 90 tablet 1  . cephALEXin (KEFLEX) 500 MG capsule Take 500 mg by mouth 2 (two) times daily.    . ciprofloxacin (CIPRO) 500 MG tablet Take 1 tablet by mouth as directed. Patient will take 1 tablet twice a day for 1 month; current regimen is Bactrim - will start Cipro in April    . nitroGLYCERIN (NITROSTAT) 0.4 MG SL tablet Place 0.4 mg under the tongue every 5 (five) minutes as needed for chest pain.      Results for orders placed or performed during the hospital encounter of 02/09/20 (from the past 48 hour(s))  SARS CORONAVIRUS 2 (TAT 6-24 HRS) Nasopharyngeal Nasopharyngeal Swab     Status: None   Collection Time: 02/09/20 10:30 AM   Specimen: Nasopharyngeal Swab  Result Value Ref Range   SARS Coronavirus 2 NEGATIVE NEGATIVE    Comment: (NOTE) SARS-CoV-2 target nucleic acids are NOT DETECTED.  The SARS-CoV-2 RNA is generally detectable in upper and lower respiratory specimens during the acute phase of infection. Negative results do not preclude SARS-CoV-2 infection, do not rule out co-infections with other pathogens, and should not be used as the sole basis for treatment or other patient management decisions. Negative results must be combined with clinical observations, patient history, and epidemiological information. The expected result is Negative.  Fact Sheet for Patients: SugarRoll.be  Fact Sheet for  Healthcare Providers: https://www.woods-mathews.com/  This test is not yet approved or cleared by the [place] FDA and  has been authorized for detection and/or diagnosis of SARS-CoV-2 by FDA under an Emergency Use Authorization (EUA). This EUA will remain  in effect (meaning this test can be used) for the duration of the COVID-19 declaration under Se ction 564(b)(1) of the Act, 21 U.S.C. section 360bbb-3(b)(1), unless the authorization is terminated or revoked sooner.  Performed at [facility], [place] [address] [place], [place] [address]    No results found.  Review of Systems  Blood pressure 138/73, pulse 60, temperature 98 F (36.7 C), temperature source Oral, resp. rate 15, height 5\' 10"  (  1.778 m), weight 79.4 kg, SpO2 98 %. Physical Exam HENT:     Mouth/Throat:     Mouth: Mucous membranes are moist.     Pharynx: Oropharynx is clear.  Eyes:     General: No scleral icterus.    Conjunctiva/sclera: Conjunctivae normal.  Cardiovascular:     Rate and Rhythm: Normal rate and regular rhythm.     Heart sounds: Normal heart sounds. No murmur heard.   Pulmonary:     Effort: Pulmonary effort is normal.     Breath sounds: Normal breath sounds.  Abdominal:     Comments: Abdomen is flat.  His long scar in right upper quadrant.  He has a biliary catheter in place.  Abdomen is soft and nontender with organomegaly or masses.  Musculoskeletal:        General: No swelling.     Cervical back: Neck supple.  Lymphadenopathy:     Cervical: No cervical adenopathy.  Skin:    General: Skin is warm and dry.  Neurological:     Mental Status: He is alert.      Assessment/Plan Esophageal dysphagia in patient with history of GERD esophageal stricture in November. Esophagogastroduodenoscopy with esophageal dilation.  Hildred Laser, MD 02/11/2020, 7:29 AM

## 2020-02-11 NOTE — Op Note (Signed)
[facility] Patient Name: Alexander Burton Procedure Date: 02/11/2020 7:07 AM MRN: [id number] Date of Birth: [birth year] Attending MD: Hildred Laser , MD CSN: [id number] Age: 79 Admit Type: Outpatient Procedure:                Upper GI endoscopy Indications:              Esophageal dysphagia, Stricture of the esophagus,                            For therapy of esophageal stricture Providers:                Hildred Laser, MD, Caprice Kluver, Casimer Bilis, Technician Referring MD:             [name], MD Medicines:                Lidocaine spray, Meperidine 30 mg IV, Midazolam 4                            mg IV Complications:            No immediate complications. Estimated Blood Loss:     Estimated blood loss was minimal. Procedure:                Pre-Anesthesia Assessment:                           - Prior to the procedure, a History and Physical                            was performed, and patient medications and                            allergies were reviewed. The patient's tolerance of                            previous anesthesia was also reviewed. The risks                            and benefits of the procedure and the sedation                            options and risks were discussed with the patient.                            All questions were answered, and informed consent                            was obtained. Prior Anticoagulants: The patient has                            taken no previous anticoagulant or antiplatelet  agents except for aspirin. ASA Grade Assessment:                            III - A patient with severe systemic disease. After                            reviewing the risks and benefits, the patient was                            deemed in satisfactory condition to undergo the                            procedure.                           After obtaining informed consent, the  endoscope was                            passed under direct vision. Throughout the                            procedure, the patient's blood pressure, pulse, and                            oxygen saturations were monitored continuously. The                            [id number]-H190 ([id number]) scope was introduced through the                            mouth, and advanced to the second part of duodenum.                            The upper GI endoscopy was accomplished without                            difficulty. The patient tolerated the procedure                            well. Scope In: 7:39:34 AM Scope Out: 7:47:25 AM Total Procedure Duration: 0 hours 7 minutes 51 seconds  Findings:      The hypopharynx was normal.      One benign-appearing, intrinsic moderate stenosis was found 40 cm from       the incisors. The stenosis was traversed. A TTS dilator was passed       through the scope. Dilation with a 15-16.5-18 mm balloon dilator was       performed to 15 mm, 16.5 mm and 18 mm. The dilation site was examined       and showed mild mucosal disruption, moderate improvement in luminal       narrowing and no perforation.      The Z-line was regular and was found 40 cm from the incisors.      Mild portal hypertensive gastropathy was found in the gastric fundus and  in the gastric body.      The exam of the stomach was otherwise normal.      The duodenal bulb and second portion of the duodenum were normal. Impression:               - Normal hypopharynx.                           - Benign-appearing esophageal stenosis at GEJ.                            Dilated.                           - Z-line regular, 40 cm from the incisors.                           - Portal hypertensive gastropathy.                           - Normal duodenal bulb and second portion of the                            duodenum.                           - No specimens collected. Moderate Sedation:      Moderate  (conscious) sedation was administered by the endoscopy nurse       and supervised by the endoscopist. The following parameters were       monitored: oxygen saturation, heart rate, blood pressure, CO2       capnography and response to care. Total physician intraservice time was       13 minutes. Recommendation:           - Patient has a contact number available for                            emergencies. The signs and symptoms of potential                            delayed complications were discussed with the                            patient. Return to normal activities tomorrow.                            Written discharge instructions were provided to the                            patient.                           - Resume previous diet today.                           - Continue present medications.                           -  No aspirin, ibuprofen, naproxen, or other                            non-steroidal anti-inflammatory drugs for 3 days.                           - Telephone GI clinic in 1 week.                           - Repeat upper endoscopy PRN. Procedure Code(s):        --- Professional ---                           [phone], Esophagogastroduodenoscopy, flexible,                            transoral; with transendoscopic balloon dilation of                            esophagus (less than 30 mm diameter)                           G0500, Moderate sedation services provided by the                            same physician or other qualified health care                            professional performing a gastrointestinal                            endoscopic service that sedation supports,                            requiring the presence of an independent trained                            observer to assist in the monitoring of the                            patient's level of consciousness and physiological                            status; initial 15 minutes of  intra-service time;                            patient age 57 years or older (additional time may                            be reported with [phone], as appropriate) Diagnosis Code(s):        --- Professional ---                           K22.2, Esophageal obstruction  K76.6, Portal hypertension                           K31.89, Other diseases of stomach and duodenum                           R13.14, Dysphagia, pharyngoesophageal phase CPT copyright 2019 American Medical Association. All rights reserved. The codes documented in this report are preliminary and upon coder review may  be revised to meet current compliance requirements. Hildred Laser, MD Hildred Laser, MD 02/11/2020 8:00:25 AM This report has been signed electronically. Number of Addenda: 0

## 2020-02-11 NOTE — Discharge Instructions (Signed)
Upper Endoscopy, Adult, Care After This sheet gives you information about how to care for yourself after your procedure. Your health care provider may also give you more specific instructions. If you have problems or questions, contact your health care provider. What can I expect after the procedure? After the procedure, it is common to have:  A sore throat.  Mild stomach pain or discomfort.  Bloating.  Nausea. Follow these instructions at home:   Follow instructions from your health care provider about what to eat or drink after your procedure.  Return to your normal activities as told by your health care provider. Ask your health care provider what activities are safe for you.  Take over-the-counter and prescription medicines only as told by your health care provider.  Do not drive for 24 hours if you were given a sedative during your procedure.  Keep all follow-up visits as told by your health care provider. This is important. Contact a health care provider if you have:  A sore throat that lasts longer than one day.  Trouble swallowing. Get help right away if:  You vomit blood or your vomit looks like coffee grounds.  You have: ? A fever. ? Bloody, black, or tarry stools. ? A severe sore throat or you cannot swallow. ? Difficulty breathing. ? Severe pain in your chest or abdomen. Summary  After the procedure, it is common to have a sore throat, mild stomach discomfort, bloating, and nausea.  Do not drive for 24 hours if you were given a sedative during the procedure.  Follow instructions from your health care provider about what to eat or drink after your procedure.  Return to your normal activities as told by your health care provider. This information is not intended to replace advice given to you by your health care provider. Make sure you discuss any questions you have with your health care provider. Document Revised: 11/05/2017 Document Reviewed:  10/14/2017 Elsevier Patient Education  [place].   No aspirin or NSAIDs for 3 days. Resume other medications as before. Resume usual diet.  Remember to chew your food thoroughly and eat slowly. No driving for 24 hours. Please call office with progress report in 1 week.   Esophageal Dilatation Esophageal dilatation, also called esophageal dilation, is a procedure to widen or open (dilate) a blocked or narrowed part of the esophagus. The esophagus is the part of the body that moves food and liquid from the mouth to the stomach. You may need this procedure if:  You have a buildup of scar tissue in your esophagus that makes it difficult, painful, or impossible to swallow. This can be caused by gastroesophageal reflux disease (GERD).  You have cancer of the esophagus.  There is a problem with how food moves through your esophagus. In some cases, you may need this procedure repeated at a later time to dilate the esophagus gradually. Tell a health care provider about:  Any allergies you have.  All medicines you are taking, including vitamins, herbs, eye drops, creams, and over-the-counter medicines.  Any problems you or family members have had with anesthetic medicines.  Any blood disorders you have.  Any surgeries you have had.  Any medical conditions you have.  Any antibiotic medicines you are required to take before dental procedures.  Whether you are pregnant or may be pregnant. What are the risks? Generally, this is a safe procedure. However, problems may occur, including:  Bleeding due to a tear in the lining of the  esophagus.  A hole (perforation) in the esophagus. What happens before the procedure?  Follow instructions from your health care provider about eating or drinking restrictions.  Ask your health care provider about changing or stopping your regular medicines. This is especially important if you are taking diabetes medicines or blood thinners.  Plan  to have someone take you home from the hospital or clinic.  Plan to have a responsible adult care for you for at least 24 hours after you leave the hospital or clinic. This is important. What happens during the procedure?  You may be given a medicine to help you relax (sedative).  A numbing medicine may be sprayed into the back of your throat, or you may gargle the medicine.  Your health care provider may perform the dilatation using various surgical instruments, such as: ? Simple dilators. This instrument is carefully placed in the esophagus to stretch it. ? Guided wire bougies. This involves using an endoscope to insert a wire into the esophagus. A dilator is passed over this wire to enlarge the esophagus. Then the wire is removed. ? Balloon dilators. An endoscope with a small balloon at the end is inserted into the esophagus. The balloon is inflated to stretch the esophagus and open it up. The procedure may vary among health care providers and hospitals. What happens after the procedure?  Your blood pressure, heart rate, breathing rate, and blood oxygen level will be monitored until the medicines you were given have worn off.  Your throat may feel slightly sore and numb. This will improve slowly over time.  You will not be allowed to eat or drink until your throat is no longer numb.  When you are able to drink, urinate, and sit on the edge of the bed without nausea or dizziness, you may be able to return home. Follow these instructions at home:  Take over-the-counter and prescription medicines only as told by your health care provider.  Do not drive for 24 hours if you were given a sedative during your procedure.  You should have a responsible adult with you for 24 hours after the procedure.  Follow instructions from your health care provider about any eating or drinking restrictions.  Do not use any products that contain nicotine or tobacco, such as cigarettes and e-cigarettes.  If you need help quitting, ask your health care provider.  Keep all follow-up visits as told by your health care provider. This is important. Get help right away if you:  Have a fever.  Have chest pain.  Have pain that is not relieved by medication.  Have trouble breathing.  Have trouble swallowing.  Vomit blood. Summary  Esophageal dilatation, also called esophageal dilation, is a procedure to widen or open (dilate) a blocked or narrowed part of the esophagus.  Plan to have someone take you home from the hospital or clinic.  For this procedure, a numbing medicine may be sprayed into the back of your throat, or you may gargle the medicine.  Do not drive for 24 hours if you were given a sedative during your procedure. This information is not intended to replace advice given to you by your health care provider. Make sure you discuss any questions you have with your health care provider. Document Revised: 03/11/2019 Document Reviewed: 03/19/2017 Elsevier Patient Education  2020 [employer].

## 2020-02-16 ENCOUNTER — Encounter (HOSPITAL_COMMUNITY): Payer: Self-pay | Admitting: Internal Medicine

## 2020-02-22 DIAGNOSIS — K8309 Other cholangitis: Secondary | ICD-10-CM | POA: Diagnosis not present

## 2020-02-22 DIAGNOSIS — Z9049 Acquired absence of other specified parts of digestive tract: Secondary | ICD-10-CM | POA: Diagnosis not present

## 2020-02-22 DIAGNOSIS — K4090 Unilateral inguinal hernia, without obstruction or gangrene, not specified as recurrent: Secondary | ICD-10-CM | POA: Diagnosis not present

## 2020-02-22 DIAGNOSIS — I2510 Atherosclerotic heart disease of native coronary artery without angina pectoris: Secondary | ICD-10-CM | POA: Diagnosis not present

## 2020-02-22 DIAGNOSIS — G4730 Sleep apnea, unspecified: Secondary | ICD-10-CM | POA: Diagnosis not present

## 2020-02-22 DIAGNOSIS — R338 Other retention of urine: Secondary | ICD-10-CM | POA: Diagnosis not present

## 2020-02-22 DIAGNOSIS — R768 Other specified abnormal immunological findings in serum: Secondary | ICD-10-CM | POA: Diagnosis not present

## 2020-02-22 DIAGNOSIS — Z955 Presence of coronary angioplasty implant and graft: Secondary | ICD-10-CM | POA: Diagnosis not present

## 2020-02-22 DIAGNOSIS — I10 Essential (primary) hypertension: Secondary | ICD-10-CM | POA: Diagnosis not present

## 2020-02-22 DIAGNOSIS — I4891 Unspecified atrial fibrillation: Secondary | ICD-10-CM | POA: Diagnosis not present

## 2020-02-22 DIAGNOSIS — D696 Thrombocytopenia, unspecified: Secondary | ICD-10-CM | POA: Diagnosis not present

## 2020-02-22 DIAGNOSIS — I739 Peripheral vascular disease, unspecified: Secondary | ICD-10-CM | POA: Diagnosis not present

## 2020-02-22 DIAGNOSIS — N401 Enlarged prostate with lower urinary tract symptoms: Secondary | ICD-10-CM | POA: Diagnosis not present

## 2020-02-22 DIAGNOSIS — Z01818 Encounter for other preprocedural examination: Secondary | ICD-10-CM | POA: Diagnosis not present

## 2020-03-02 DIAGNOSIS — T85638A Leakage of other specified internal prosthetic devices, implants and grafts, initial encounter: Secondary | ICD-10-CM | POA: Diagnosis not present

## 2020-03-02 DIAGNOSIS — Z20822 Contact with and (suspected) exposure to covid-19: Secondary | ICD-10-CM | POA: Diagnosis not present

## 2020-03-02 DIAGNOSIS — K831 Obstruction of bile duct: Secondary | ICD-10-CM | POA: Diagnosis not present

## 2020-03-08 ENCOUNTER — Telehealth (INDEPENDENT_AMBULATORY_CARE_PROVIDER_SITE_OTHER): Payer: Self-pay | Admitting: Internal Medicine

## 2020-03-08 NOTE — Telephone Encounter (Signed)
Spouse left voice mail message stating Dr [name] had called them last Friday night - they were talking about patients appointment with Dr Raliegh Ip - stated Dr [name] stated he would like to see patient before his appointment with Dr Raliegh Ip - can someone check with Dr [name] to see if he wants to see patient this week -

## 2020-03-08 NOTE — Telephone Encounter (Signed)
Per Dr.Rehman - just wanted to know how his swallowing was. Patient will not need appointment with Dr.Rehman.  Let the patients wife know.

## 2020-03-15 ENCOUNTER — Inpatient Hospital Stay (HOSPITAL_COMMUNITY): Payer: Medicare Other

## 2020-03-15 ENCOUNTER — Other Ambulatory Visit: Payer: Self-pay

## 2020-03-15 ENCOUNTER — Inpatient Hospital Stay (HOSPITAL_COMMUNITY): Payer: Medicare Other | Attending: Hematology | Admitting: Hematology

## 2020-03-15 ENCOUNTER — Encounter (HOSPITAL_COMMUNITY): Payer: Self-pay | Admitting: Hematology

## 2020-03-15 VITALS — BP 131/83 | HR 64 | Temp 97.1°F | Resp 18 | Ht 71.0 in | Wt 178.0 lb

## 2020-03-15 DIAGNOSIS — I480 Paroxysmal atrial fibrillation: Secondary | ICD-10-CM | POA: Diagnosis not present

## 2020-03-15 DIAGNOSIS — N400 Enlarged prostate without lower urinary tract symptoms: Secondary | ICD-10-CM | POA: Insufficient documentation

## 2020-03-15 DIAGNOSIS — I2510 Atherosclerotic heart disease of native coronary artery without angina pectoris: Secondary | ICD-10-CM | POA: Diagnosis not present

## 2020-03-15 DIAGNOSIS — M1990 Unspecified osteoarthritis, unspecified site: Secondary | ICD-10-CM | POA: Diagnosis not present

## 2020-03-15 DIAGNOSIS — J449 Chronic obstructive pulmonary disease, unspecified: Secondary | ICD-10-CM | POA: Insufficient documentation

## 2020-03-15 DIAGNOSIS — Z8546 Personal history of malignant neoplasm of prostate: Secondary | ICD-10-CM | POA: Diagnosis not present

## 2020-03-15 DIAGNOSIS — Z79899 Other long term (current) drug therapy: Secondary | ICD-10-CM | POA: Insufficient documentation

## 2020-03-15 DIAGNOSIS — Z87891 Personal history of nicotine dependence: Secondary | ICD-10-CM | POA: Diagnosis not present

## 2020-03-15 DIAGNOSIS — E785 Hyperlipidemia, unspecified: Secondary | ICD-10-CM | POA: Insufficient documentation

## 2020-03-15 DIAGNOSIS — D696 Thrombocytopenia, unspecified: Secondary | ICD-10-CM | POA: Diagnosis not present

## 2020-03-15 DIAGNOSIS — Z23 Encounter for immunization: Secondary | ICD-10-CM | POA: Insufficient documentation

## 2020-03-15 DIAGNOSIS — G4730 Sleep apnea, unspecified: Secondary | ICD-10-CM | POA: Insufficient documentation

## 2020-03-15 DIAGNOSIS — Z7982 Long term (current) use of aspirin: Secondary | ICD-10-CM | POA: Diagnosis not present

## 2020-03-15 LAB — RETICULOCYTES
Immature Retic Fract: 11.4 % (ref 2.3–15.9)
RBC.: 4.42 MIL/uL (ref 4.22–5.81)
Retic Count, Absolute: 63.2 10*3/uL (ref 19.0–186.0)
Retic Ct Pct: 1.4 % (ref 0.4–3.1)

## 2020-03-15 LAB — CBC WITH DIFFERENTIAL/PLATELET
Abs Immature Granulocytes: 0 10*3/uL (ref 0.00–0.07)
Basophils Absolute: 0 10*3/uL (ref 0.0–0.1)
Basophils Relative: 0 %
Eosinophils Absolute: 0.1 10*3/uL (ref 0.0–0.5)
Eosinophils Relative: 1 %
HCT: 37.9 % — ABNORMAL LOW (ref 39.0–52.0)
Hemoglobin: 12 g/dL — ABNORMAL LOW (ref 13.0–17.0)
Immature Granulocytes: 0 %
Lymphocytes Relative: 17 %
Lymphs Abs: 0.8 10*3/uL (ref 0.7–4.0)
MCH: 28.4 pg (ref 26.0–34.0)
MCHC: 31.7 g/dL (ref 30.0–36.0)
MCV: 89.6 fL (ref 80.0–100.0)
Monocytes Absolute: 0.3 10*3/uL (ref 0.1–1.0)
Monocytes Relative: 6 %
Neutro Abs: 3.4 10*3/uL (ref 1.7–7.7)
Neutrophils Relative %: 76 %
Platelets: 64 10*3/uL — ABNORMAL LOW (ref 150–400)
RBC: 4.23 MIL/uL (ref 4.22–5.81)
RDW: 15.9 % — ABNORMAL HIGH (ref 11.5–15.5)
WBC: 4.5 10*3/uL (ref 4.0–10.5)
nRBC: 0 % (ref 0.0–0.2)

## 2020-03-15 LAB — FOLATE: Folate: 23.1 ng/mL (ref >5.9)

## 2020-03-15 LAB — PLATELET BY CITRATE

## 2020-03-15 LAB — SAVE SMEAR(SSMR), FOR PROVIDER SLIDE REVIEW

## 2020-03-15 LAB — VITAMIN B12: Vitamin B-12: 559 pg/mL (ref 180–914)

## 2020-03-15 LAB — LACTATE DEHYDROGENASE: LDH: 138 U/L (ref 98–192)

## 2020-03-15 MED ORDER — INFLUENZA VAC A&B SA ADJ QUAD 0.5 ML IM PRSY
0.5000 mL | PREFILLED_SYRINGE | Freq: Once | INTRAMUSCULAR | Status: AC
  Administered 2020-03-15: 0.5 mL via INTRAMUSCULAR
  Filled 2020-03-15: qty 0.5

## 2020-03-15 NOTE — Patient Instructions (Signed)
[place] at [facility] Discharge Instructions  You were seen today by Dr. Delton Coombes. He went over your recent results. You had labs drawn today for further analysis. Dr. Delton Coombes will see you back in 10 days for follow up.   Thank you for choosing [place] at [facility] to provide your oncology and hematology care.  To afford each patient quality time with our provider, please arrive at least 15 minutes before your scheduled appointment time.   If you have a lab appointment with the [facility] please come in thru the Main Entrance and check in at the main information desk  You need to re-schedule your appointment should you arrive 10 or more minutes late.  We strive to give you quality time with our providers, and arriving late affects you and other patients whose appointments are after yours.  Also, if you no show three or more times for appointments you may be dismissed from the clinic at the providers discretion.     Again, thank you for choosing [facility].  Our hope is that these requests will decrease the amount of time that you wait before being seen by our physicians.       _____________________________________________________________  Should you have questions after your visit to [facility], please contact our office at [phone] between the hours of 8:00 a.m. and 4:30 p.m.  Voicemails left after 4:00 p.m. will not be returned until the following business day.  For prescription refill requests, have your pharmacy contact our office and allow 72 hours.    Cancer Center Support Programs:   > Cancer Support Group  2nd Tuesday of the month 1pm-2pm, Journey Room

## 2020-03-15 NOTE — Progress Notes (Signed)
Patient tolerated flue vaccine with no complaints voiced.  Site clean and dry with no bruising or swelling noted at site.  Band aid applied.  VSS.

## 2020-03-15 NOTE — Progress Notes (Signed)
[facility] [address] [place] [address]   CLINIC:  Medical Oncology/Hematology  Patient Care Team: Celene Squibb, MD as PCP - General (Internal Medicine) Michel Bickers, MD as PCP - Infectious Diseases (Infectious Diseases) Satira Sark, MD as PCP - Cardiology (Cardiology) Nestor Lewandowsky as Surgeon (Surgery)  CHIEF COMPLAINTS/PURPOSE OF CONSULTATION:  Evaluation of thrombocytopenia  HISTORY OF PRESENTING ILLNESS:  Alexander Burton 79 y.o. male is here because of evaluation of thrombocytopenia, at the request of Dr. Wende Neighbors. On 9/27 his platelets were 69 and his platelets have been chronically decreased since 12/2011.  Today he is accompanied by his wife. He reports feeling well. He was supposed to have an inguinal hernia repair at [facility] but was discovered to have low platelets and the operation was delayed. He has never been informed of having low platelet counts. He is currently taking Keflex for October, then he'll take Cipro in November, then Augmentin in December; he has been on this schedule for over a year. He has a liver stent and needs to have it switched regularly; he had a botched liver operation in [facility] in [facility]. He had his esophagus dilated twice and he is able to swallow food and drink; he drinks 2 cans of Boost/Ensure daily. He has easy bruising and bleeding in his arms and lower legs; he is currently taking ASA 81. He denies having nosebleeds, hematochezia or hematuria. He flushes his hepatic stent TID. He has never had a blood transfusion and was never informed of having hepatitis.  He is able to do his chores at home. He is a retired Psychologist, sport and exercise. He quit smoking in 2009 after smoking 1-1.5 PPD for 44 years. His sister has colon cancer. He denies history of thrombocytopenia, lupus or RA in his family.   MEDICAL HISTORY:  Past Medical History:  Diagnosis Date  . BPH (benign prostatic hyperplasia)   . CAD (coronary artery disease)    DES  to LAD 06/2008  . Colon polyps   . COPD (chronic obstructive pulmonary disease) ([place])   . Gastritis   . Goiter   . 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 History of syncope January 2010   PVC's and14 beats NSVT as well as atrial afib  . Hyperlipidemia   . Melanoma ([place])   . Osteoarthritis   . PAF (paroxysmal atrial fibrillation) ([place])   . Prostate CA ([place])   . Sleep apnea 04/02/2005    SURGICAL HISTORY: Past Surgical History:  Procedure Laterality Date  . CARDIAC CATHETERIZATION  Feb 2010   post DES to LAD ,3.5 X 33 mm Cypher stent,dilated up to 3.8 mm 80 to90% stenosis btwn first and second diag branc w.moderate disease EF 50%  . CHOLECYSTECTOMY     Complicated by rupture of common bile duct and other vascular structures. Has now had multiple issues with chronic hepatic abscess. Currently has indwelling drain  . COLONOSCOPY  08/16/2011   Procedure: COLONOSCOPY;  Surgeon: Rogene Houston, MD;  [facility];  Service: Endoscopy;  Laterality: N/A;  1200  . ESOPHAGEAL DILATION N/A 01/20/2015   Procedure: ESOPHAGEAL DILATION;  Surgeon: Rogene Houston, MD;  Location[facility];  Service: Endoscopy;  Laterality: N/A;  . ESOPHAGEAL DILATION N/A 02/11/2020   Procedure: ESOPHAGEAL DILATION;  Surgeon: Rogene Houston, MD;  [facility];  Service: Endoscopy;  Laterality: N/A;  . ESOPHAGOGASTRODUODENOSCOPY N/A 01/20/2015   Procedure: ESOPHAGOGASTRODUODENOSCOPY (  EGD);  Surgeon: Rogene Houston, MD;  [facility];  Service: Endoscopy;  Laterality: N/A;  1200  . ESOPHAGOGASTRODUODENOSCOPY N/A 02/11/2020   Procedure: ESOPHAGOGASTRODUODENOSCOPY (EGD);  Surgeon: Rogene Houston, MD;  [facility];  Service: Endoscopy;  Laterality: N/A;  730  . LEA DOPPLER  07/28/2008   NORMAL RGT GROIN DUPLEX DOPPLER  . liver stent    . LIVER SURGERY  09-03/14   @ [facility]  . Melanoma resection      SOCIAL HISTORY: Social History    Socioeconomic History  . Marital status: Married    Spouse name: Not on file  . Number of children: 2  . Years of education: Not on file  . Highest education level: Not on file  Occupational History    Employer: RETIRED  Tobacco Use  . Smoking status: Former Smoker    Packs/day: 1.50    Years: 50.00    Pack years: 75.00    Types: Cigarettes    Quit date: 05/29/2007    Years since quitting: 12.8  . Smokeless tobacco: Never Used  Vaping Use  . Vaping Use: Never used  Substance and Sexual Activity  . Alcohol use: Yes    Alcohol/week: 1.0 standard drink    Types: 1 Standard drinks or equivalent per week    Comment: very rare  . Drug use: No  . Sexual activity: Not on file  Other Topics Concern  . Not on file  Social History Narrative   Married father to come a grandfather 51. 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 Has more fatigue with walking but still able to walk up to 2 miles prior to his most recent hospitalization.   Social Determinants of Health   Financial Resource Strain:   . Difficulty of Paying Living Expenses: Not on file  Food Insecurity:   . Worried About Charity fundraiser in the Last Year: Not on file  . Ran Out of Food in the Last Year: Not on file  Transportation Needs:   . Lack of Transportation (Medical): Not on file  . Lack of Transportation (Non-Medical): Not on file  Physical Activity:   . Days of Exercise per Week: Not on file  . Minutes of Exercise per Session: Not on file  Stress:   . Feeling of Stress : Not on file  Social Connections:   . Frequency of Communication with Friends and Family: Not on file  . Frequency of Social Gatherings with Friends and Family: Not on file  . Attends Religious Services: Not on file  . Active Member of Clubs or Organizations: Not on file  . Attends Archivist Meetings: Not on file  . Marital Status: Not on file  Intimate Partner Violence:   . Fear of Current or Ex-Partner: Not on file   . Emotionally Abused: Not on file  . Physically Abused: Not on file  . Sexually Abused: Not on file    FAMILY HISTORY: Family History  Problem Relation Age of Onset  . Colon cancer Sister     ALLERGIES:  is allergic to contrast media [iodinated diagnostic agents], metrizamide, and other.  MEDICATIONS:  Current Outpatient Medications  Medication Sig Dispense Refill  . amoxicillin-clavulanate (AUGMENTIN) 875-125 MG tablet Take 1 tablet by mouth 2 (two) times daily. For 30 days  - will start Augmentin in May    . aspirin EC 81 MG tablet Take 1 tablet (81 mg total) by mouth every evening. [place]  tablet 11  . cephALEXin (KEFLEX) 500 MG capsule Take 500 mg by mouth 2 (two) times daily.    . ciprofloxacin (CIPRO) 500 MG tablet Take 1 tablet by mouth as directed. Patient will take 1 tablet twice a day for 1 month; current regimen is Bactrim - will start Cipro in April    . cycloSPORINE (RESTASIS) 0.05 % ophthalmic emulsion Place 1 drop into both eyes 2 (two) times daily.     . finasteride (PROSCAR) 5 MG tablet Take 5 mg by mouth daily.     . metoprolol tartrate (LOPRESSOR) 25 MG tablet TAKE 1/2 TABLET(12.5 MG) BY MOUTH TWICE DAILY (Patient taking differently: Take 12.5 mg by mouth 2 (two) times daily. ) 90 tablet 3  . mirtazapine (REMERON) 7.5 MG tablet Take 7.5 mg by mouth at bedtime.    . Multiple Vitamin (MULITIVITAMIN WITH MINERALS) TABS Take 1 tablet by mouth daily. Centrum Silver men 50+    . Multiple Vitamins-Minerals (PRESERVISION AREDS) CAPS Take 1 capsule by mouth in the morning and at bedtime.     . naproxen sodium (ALEVE) 220 MG tablet Take 220 mg by mouth 2 (two) times daily as needed (pain).     . nitroGLYCERIN (NITROSTAT) 0.4 MG SL tablet Place 0.4 mg under the tongue every 5 (five) minutes as needed for chest pain.    Marland Kitchen Oxymetazoline HCl (AFRIN NASAL SPRAY NA) Place 2 sprays into the nose 2 (two) times daily as needed.     . pantoprazole (PROTONIX) 40 MG tablet Take 40 mg by mouth  at bedtime.     . pravastatin (PRAVACHOL) 20 MG tablet TAKE 1 TABLET(20 MG) BY MOUTH DAILY (Patient taking differently: Take 20 mg by mouth daily. ) 90 tablet 3  . Probiotic Product (PROBIOTIC DAILY PO) Take 1 tablet by mouth daily.    . tamsulosin (FLOMAX) 0.4 MG CAPS capsule Take 0.4 mg by mouth in the morning and at bedtime.   3  . ursodiol (ACTIGALL) 250 MG tablet TAKE 1 TABLET(250 MG) BY MOUTH IN THE MORNING AND AT BEDTIME (Patient taking differently: Take 250 mg by mouth 2 (two) times daily. ) 90 tablet 1   No current facility-administered medications for this visit.    REVIEW OF SYSTEMS:   Review of Systems  Constitutional: Positive for appetite change (50%) and fatigue (25%).  HENT:   Negative for nosebleeds and trouble swallowing.   Respiratory: Positive for shortness of breath (w/ exertion).   Cardiovascular: Positive for leg swelling (feet & ankle swelling).  Gastrointestinal: Negative for blood in stool.  Genitourinary: Negative for hematuria.   Psychiatric/Behavioral: Positive for sleep disturbance.  All other systems reviewed and are negative.    PHYSICAL EXAMINATION: ECOG PERFORMANCE STATUS: 1 - Symptomatic but completely ambulatory  Vitals:   03/15/20 1324  BP: 131/83  Pulse: 64  Resp: 18  Temp: (!) 97.1 F (36.2 C)  SpO2: 97%   Filed Weights   03/15/20 1324  Weight: 178 lb (80.7 kg)   Physical Exam Vitals reviewed.  Constitutional:      Appearance: Normal appearance.  Cardiovascular:     Rate and Rhythm: Normal rate and regular rhythm.     Pulses: Normal pulses.     Heart sounds: Normal heart sounds.  Pulmonary:     Effort: Pulmonary effort is normal.     Breath sounds: Normal breath sounds.  Abdominal:     Palpations: Abdomen is soft.     Tenderness: There is no abdominal tenderness.  Comments: Biliary stent  Musculoskeletal:     Right lower leg: Edema (1+) present.     Left lower leg: Edema (1+) present.  Neurological:     General: No  focal deficit present.     Mental Status: He is alert and oriented to person, place, and time.  Psychiatric:        Mood and Affect: Mood normal.        Behavior: Behavior normal.      LABORATORY DATA:  I have reviewed the data as listed Recent Results (from the past 2160 hour(s))  SARS CORONAVIRUS 2 (TAT 6-24 HRS) Nasopharyngeal Nasopharyngeal Swab     Status: None   Collection Time: 02/09/20 10:30 AM   Specimen: Nasopharyngeal Swab  Result Value Ref Range   SARS Coronavirus 2 NEGATIVE NEGATIVE    Comment: (NOTE) SARS-CoV-2 target nucleic acids are NOT DETECTED.  The SARS-CoV-2 RNA is generally detectable in upper and lower respiratory specimens during the acute phase of infection. Negative results do not preclude SARS-CoV-2 infection, do not rule out co-infections with other pathogens, and should not be used as the sole basis for treatment or other patient management decisions. Negative results must be combined with clinical observations, patient history, and epidemiological information. The expected result is Negative.  Fact Sheet for Patients: SugarRoll.be  Fact Sheet for Healthcare Providers: https://www.woods-mathews.com/  This test is not yet approved or cleared by the [place] FDA and  has been authorized for detection and/or diagnosis of SARS-CoV-2 by FDA under an Emergency Use Authorization (EUA). This EUA will remain  in effect (meaning this test can be used) for the duration of the COVID-19 declaration under Se ction 564(b)(1) of the Act, 21 U.S.C. section 360bbb-3(b)(1), unless the authorization is terminated or revoked sooner.  Performed at [facility], [place] [address] [place], [place] [address]     RADIOGRAPHIC STUDIES: I have personally reviewed the radiological images as listed and agreed with the findings in the report.  ASSESSMENT:  1.  Thrombocytopenia: -Patient seen at the request of Dr. Nevada Crane  for thrombocytopenia.  Recent labs at his office showed platelet count 61, unable to quantitate due to aggregation. -Review of labs show patient has mild to moderate thrombocytopenia for the last 10 years. -Easy bruising on extremities.  Patient on aspirin 81 mg daily. -No prior history of transfusions.  No hematuria/melena/bleeding per rectum. -He has history of recurrent biliary infections and has been on continuous antibiotics for the last 1 year (Cipro alternating with Augmentin alternating with Keflex). -He reportedly needs surgery for his right inguinal hernia and evaluated by a surgeon at [facility]. -CT AP on 08/06/2019 at [facility] shows splenomegaly measuring 14 cm.  Right hepatectomy.  Percutaneous transhepatic left biliary drain traverses the biliary stent with pigtail along the right hepatic resection margin.  Ill-defined linear hypodensities along the subcapsular left hepatic lobe, possibly dilated bile ducts are chronically thrombosed peripheral portal vein branches. -EGD on 02/11/2020 by Dr. [name] showing benign esophageal stenosis at GE junction, portal hypertensive gastropathy, normal duodenum.  2.  Paroxysmal atrial fibrillation: -He is on aspirin 81 mg daily.  He is not on DOAC.  3.  He requires PBD exchange every few months done by Dr.Ravindra at [facility].  He is also on continuous antibiotics prescribed by ID at [facility].    PLAN:  1.  Moderate thrombocytopenia: -Previous smear description showed giant platelets. -Differential diagnosis includes immune mediated thrombocytopenia versus splenomegaly. -We will repeat platelet count and recheck  in sodium citrate tube.  We will also check for nutritional deficiencies and infectious causes.  We will also rule out connective tissue disorders. -We will also check serum protein electrophoresis. -We will see him back in 7 to 10 days to discuss results and further plan.  All questions were answered. The patient knows to call the  clinic with any problems, questions or concerns.   Derek Jack, MD 03/15/20 1:26 PM  [place] [phone]   I, Milinda Antis, am acting as a scribe for Dr. Sanda Linger.  I, Derek Jack MD, have reviewed the above documentation for accuracy and completeness, and I agree with the above.

## 2020-03-16 LAB — RHEUMATOID FACTOR: Rheumatoid fact SerPl-aCnc: <10 IU/mL (ref 0.0–13.9)

## 2020-03-16 LAB — ANTINUCLEAR ANTIBODIES, IFA: ANA Ab, IFA: NEGATIVE

## 2020-03-16 LAB — HEPATITIS C ANTIBODY: HCV Ab: NONREACTIVE

## 2020-03-16 LAB — HEPATITIS B SURFACE ANTIBODY,QUALITATIVE: Hep B S Ab: NONREACTIVE

## 2020-03-17 LAB — PROTEIN ELECTROPHORESIS, SERUM
A/G Ratio: 1.1 (ref 0.7–1.7)
Albumin ELP: 3.2 g/dL (ref 2.9–4.4)
Alpha-1-Globulin: 0.2 g/dL (ref 0.0–0.4)
Alpha-2-Globulin: 0.8 g/dL (ref 0.4–1.0)
Beta Globulin: 1 g/dL (ref 0.7–1.3)
Gamma Globulin: 0.9 g/dL (ref 0.4–1.8)
Globulin, Total: 2.9 g/dL (ref 2.2–3.9)
M-Spike, %: 0.3 g/dL — ABNORMAL HIGH
Total Protein ELP: 6.1 g/dL (ref 6.0–8.5)

## 2020-03-18 LAB — METHYLMALONIC ACID, SERUM: Methylmalonic Acid, Quantitative: 219 nmol/L (ref 0–378)

## 2020-03-20 LAB — COPPER, SERUM: Copper: 111 ug/dL (ref 69–132)

## 2020-03-21 DIAGNOSIS — D485 Neoplasm of uncertain behavior of skin: Secondary | ICD-10-CM | POA: Diagnosis not present

## 2020-03-21 DIAGNOSIS — D045 Carcinoma in situ of skin of trunk: Secondary | ICD-10-CM | POA: Diagnosis not present

## 2020-03-21 DIAGNOSIS — L578 Other skin changes due to chronic exposure to nonionizing radiation: Secondary | ICD-10-CM | POA: Diagnosis not present

## 2020-03-21 DIAGNOSIS — L814 Other melanin hyperpigmentation: Secondary | ICD-10-CM | POA: Diagnosis not present

## 2020-03-21 DIAGNOSIS — Z85828 Personal history of other malignant neoplasm of skin: Secondary | ICD-10-CM | POA: Diagnosis not present

## 2020-03-21 DIAGNOSIS — L821 Other seborrheic keratosis: Secondary | ICD-10-CM | POA: Diagnosis not present

## 2020-03-21 DIAGNOSIS — Z86018 Personal history of other benign neoplasm: Secondary | ICD-10-CM | POA: Diagnosis not present

## 2020-03-21 DIAGNOSIS — K130 Diseases of lips: Secondary | ICD-10-CM | POA: Diagnosis not present

## 2020-03-21 DIAGNOSIS — L570 Actinic keratosis: Secondary | ICD-10-CM | POA: Diagnosis not present

## 2020-03-21 DIAGNOSIS — Z87898 Personal history of other specified conditions: Secondary | ICD-10-CM | POA: Diagnosis not present

## 2020-03-23 DIAGNOSIS — D649 Anemia, unspecified: Secondary | ICD-10-CM | POA: Diagnosis not present

## 2020-03-23 DIAGNOSIS — E7849 Other hyperlipidemia: Secondary | ICD-10-CM | POA: Diagnosis not present

## 2020-03-23 DIAGNOSIS — I10 Essential (primary) hypertension: Secondary | ICD-10-CM | POA: Diagnosis not present

## 2020-03-23 DIAGNOSIS — K219 Gastro-esophageal reflux disease without esophagitis: Secondary | ICD-10-CM | POA: Diagnosis not present

## 2020-03-28 ENCOUNTER — Inpatient Hospital Stay (HOSPITAL_COMMUNITY): Payer: Medicare Other | Attending: Hematology | Admitting: Hematology

## 2020-03-28 ENCOUNTER — Encounter (HOSPITAL_COMMUNITY): Payer: Self-pay | Admitting: Hematology

## 2020-03-28 ENCOUNTER — Other Ambulatory Visit: Payer: Self-pay

## 2020-03-28 ENCOUNTER — Ambulatory Visit (HOSPITAL_COMMUNITY)
Admission: RE | Admit: 2020-03-28 | Discharge: 2020-03-28 | Disposition: A | Discharge status: Home / Self Care | Payer: Medicare Other | Source: Ambulatory Visit | Attending: Hematology | Admitting: Hematology

## 2020-03-28 VITALS — BP 123/75 | HR 70 | Temp 97.2°F | Resp 18 | Wt 181.0 lb

## 2020-03-28 DIAGNOSIS — Z85828 Personal history of other malignant neoplasm of skin: Secondary | ICD-10-CM | POA: Diagnosis not present

## 2020-03-28 DIAGNOSIS — G4730 Sleep apnea, unspecified: Secondary | ICD-10-CM | POA: Insufficient documentation

## 2020-03-28 DIAGNOSIS — I2510 Atherosclerotic heart disease of native coronary artery without angina pectoris: Secondary | ICD-10-CM | POA: Diagnosis not present

## 2020-03-28 DIAGNOSIS — Z87891 Personal history of nicotine dependence: Secondary | ICD-10-CM | POA: Insufficient documentation

## 2020-03-28 DIAGNOSIS — D472 Monoclonal gammopathy: Secondary | ICD-10-CM

## 2020-03-28 DIAGNOSIS — I4891 Unspecified atrial fibrillation: Secondary | ICD-10-CM | POA: Diagnosis not present

## 2020-03-28 DIAGNOSIS — E785 Hyperlipidemia, unspecified: Secondary | ICD-10-CM | POA: Insufficient documentation

## 2020-03-28 DIAGNOSIS — Z85820 Personal history of malignant melanoma of skin: Secondary | ICD-10-CM | POA: Diagnosis not present

## 2020-03-28 DIAGNOSIS — N400 Enlarged prostate without lower urinary tract symptoms: Secondary | ICD-10-CM | POA: Insufficient documentation

## 2020-03-28 DIAGNOSIS — J449 Chronic obstructive pulmonary disease, unspecified: Secondary | ICD-10-CM | POA: Insufficient documentation

## 2020-03-28 DIAGNOSIS — Z79899 Other long term (current) drug therapy: Secondary | ICD-10-CM | POA: Insufficient documentation

## 2020-03-28 DIAGNOSIS — D696 Thrombocytopenia, unspecified: Secondary | ICD-10-CM | POA: Diagnosis present

## 2020-03-28 NOTE — Patient Instructions (Signed)
[place] at [facility] Discharge Instructions  You were seen today by Dr. Delton Coombes. He went over your recent results. You will have additional labs drown for further analysis. You will also be scheduled for x-rays of your bones. Dr. Delton Coombes will see you back in 2 weeks for labs and follow up.   Thank you for choosing [place] at [facility] to provide your oncology and hematology care.  To afford each patient quality time with our provider, please arrive at least 15 minutes before your scheduled appointment time.   If you have a lab appointment with the [place] please come in thru the Main Entrance and check in at the main information desk  You need to re-schedule your appointment should you arrive 10 or more minutes late.  We strive to give you quality time with our providers, and arriving late affects you and other patients whose appointments are after yours.  Also, if you no show three or more times for appointments you may be dismissed from the clinic at the providers discretion.     Again, thank you for choosing [facility].  Our hope is that these requests will decrease the amount of time that you wait before being seen by our physicians.       _____________________________________________________________  Should you have questions after your visit to [facility], please contact our office at [phone] between the hours of 8:00 a.m. and 4:30 p.m.  Voicemails left after 4:00 p.m. will not be returned until the following business day.  For prescription refill requests, have your pharmacy contact our office and allow 72 hours.    Cancer Center Support Programs:   > Cancer Support Group  2nd Tuesday of the month 1pm-2pm, Journey Room

## 2020-03-28 NOTE — Progress Notes (Signed)
[place], [place] [address]   CLINIC:  Medical Oncology/Hematology  PCP:  Celene Squibb, MD [address] Alexander [place] [address]  [phone]  REASON FOR VISIT:  Follow-up for MGUS  PRIOR THERAPY: None  CURRENT THERAPY: Under work-up  INTERVAL HISTORY:  [name], a 79 y.o. male, returns for routine follow-up for his MGUS. Alexander Burton was last seen on 03/15/2020.  Today he is accompanied by his wife and he reports feeling well. He reports having easy bruising, but denies easy bleeding.   REVIEW OF SYSTEMS:  Review of Systems  Constitutional: Positive for appetite change (50%) and fatigue (25%).  HENT:   Positive for trouble swallowing.   Respiratory: Positive for shortness of breath (w/ exertion).   Hematological: Bruises/bleeds easily (bruising easily).  All other systems reviewed and are negative.   PAST MEDICAL/SURGICAL HISTORY:  Past Medical History:  Diagnosis Date  . BPH (benign prostatic hyperplasia)   . CAD (coronary artery disease)    DES to LAD 06/2008  . Colon polyps   . COPD (chronic obstructive pulmonary disease) ([place])   . Enlarged prostate   . Gastritis   . 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 History of syncope January 2010   PVC's and14 beats NSVT as well as atrial afib  . Hyperlipidemia   . Melanoma ([place])   . PAF (paroxysmal atrial fibrillation) ([place])   . Sleep apnea 04/02/2005   Past Surgical History:  Procedure Laterality Date  . CARDIAC CATHETERIZATION  Feb 2010   post DES to LAD ,3.5 X 33 mm Cypher stent,dilated up to 3.8 mm 80 to90% stenosis btwn first and second diag branc w.moderate disease EF 50%  . CHOLECYSTECTOMY     Complicated by rupture of common bile duct and other vascular structures. Has now had multiple issues with chronic hepatic abscess. Currently has indwelling drain  . COLONOSCOPY  08/16/2011   Procedure: COLONOSCOPY;  Surgeon: Rogene Houston, MD;  [facility];  Service: Endoscopy;  Laterality: N/A;  1200  . ESOPHAGEAL DILATION N/A 01/20/2015   Procedure: ESOPHAGEAL DILATION;  Surgeon: Rogene Houston, MD;  Location: [facility];  Service: Endoscopy;  Laterality: N/A;  . ESOPHAGEAL DILATION N/A 02/11/2020   Procedure: ESOPHAGEAL DILATION;  Surgeon: Rogene Houston, MD;  [facility]: [facility];  Service: Endoscopy;  Laterality: N/A;  . ESOPHAGOGASTRODUODENOSCOPY N/A 01/20/2015   Procedure: ESOPHAGOGASTRODUODENOSCOPY (EGD);  Surgeon: Rogene Houston, MD;  [facility];  Service: Endoscopy;  Laterality: N/A;  1200  . ESOPHAGOGASTRODUODENOSCOPY N/A 02/11/2020   Procedure: ESOPHAGOGASTRODUODENOSCOPY (EGD);  Surgeon: Rogene Houston, MD;  [facility]: [facility];  Service: Endoscopy;  Laterality: N/A;  730  . LEA DOPPLER  07/28/2008   NORMAL RGT GROIN DUPLEX DOPPLER  . liver stent    . LIVER SURGERY  09-03/14   @ [facility]  . Melanoma resection      SOCIAL HISTORY:  Social History   Socioeconomic History  . Marital status: Married    Spouse name: Not on file  . Number of children: 2  . Years of education: Not on file  . Highest education level: Not on file  Occupational History    Employer: RETIRED  Tobacco Use  . Smoking status: Former Smoker    Packs/day: 1.50    Years: 50.00    Pack years: 75.00    Types: Cigarettes  Quit date: 05/29/2007    Years since quitting: 12.8  . Smokeless tobacco: Never Used  Vaping Use  . Vaping Use: Never used  Substance and Sexual Activity  . Alcohol use: Yes    Alcohol/week: 1.0 standard drink    Types: 1 Standard drinks or equivalent per week    Comment: very rare  . Drug use: No  . Sexual activity: Not Currently  Other Topics Concern  . Not on file  Social History Narrative   Married father to come a grandfather 72. 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 Has more fatigue with walking but still able to walk up to 2 miles prior to his most  recent hospitalization.   Social Determinants of Health   Financial Resource Strain:   . Difficulty of Paying Living Expenses: Not on file  Food Insecurity:   . Worried About Charity fundraiser in the Last Year: Not on file  . Ran Out of Food in the Last Year: Not on file  Transportation Needs:   . Lack of Transportation (Medical): Not on file  . Lack of Transportation (Non-Medical): Not on file  Physical Activity:   . Days of Exercise per Week: Not on file  . Minutes of Exercise per Session: Not on file  Stress:   . Feeling of Stress : Not on file  Social Connections:   . Frequency of Communication with Friends and Family: Not on file  . Frequency of Social Gatherings with Friends and Family: Not on file  . Attends Religious Services: Not on file  . Active Member of Clubs or Organizations: Not on file  . Attends Archivist Meetings: Not on file  . Marital Status: Not on file  Intimate Partner Violence:   . Fear of Current or Ex-Partner: Not on file  . Emotionally Abused: Not on file  . Physically Abused: Not on file  . Sexually Abused: Not on file    FAMILY HISTORY:  Family History  Problem Relation Age of Onset  . Colon cancer Sister   . Heart disease Mother   . Emphysema Father     CURRENT MEDICATIONS:  Current Outpatient Medications  Medication Sig Dispense Refill  . amoxicillin-clavulanate (AUGMENTIN) 875-125 MG tablet Take 1 tablet by mouth 2 (two) times daily. For 30 days  - will start Augmentin in May    . amoxicillin-clavulanate (AUGMENTIN) 875-125 MG tablet Take by mouth.    Marland Kitchen aspirin EC 81 MG tablet Take 1 tablet (81 mg total) by mouth every evening. 30 tablet 11  . cephALEXin (KEFLEX) 500 MG capsule Take 500 mg by mouth 2 (two) times daily.    . ciprofloxacin (CIPRO) 500 MG tablet Take 1 tablet by mouth as directed. Patient will take 1 tablet twice a day for 1 month; current regimen is Bactrim - will start Cipro in April    . ciprofloxacin (CIPRO)  500 MG tablet Take by mouth.    . cycloSPORINE (RESTASIS) 0.05 % ophthalmic emulsion Place 1 drop into both eyes 2 (two) times daily.     . finasteride (PROSCAR) 5 MG tablet Take 5 mg by mouth daily.     . metoprolol tartrate (LOPRESSOR) 25 MG tablet TAKE 1/2 TABLET(12.5 MG) BY MOUTH TWICE DAILY (Patient taking differently: Take 12.5 mg by mouth 2 (two) times daily. ) 90 tablet 3  . mirtazapine (REMERON) 7.5 MG tablet Take 7.5 mg by mouth at bedtime.    . Multiple Vitamin (MULITIVITAMIN WITH MINERALS)  TABS Take 1 tablet by mouth daily. Centrum Silver men 50+    . Multiple Vitamins-Minerals (PRESERVISION AREDS) CAPS Take 1 capsule by mouth in the morning and at bedtime.     . naproxen sodium (ALEVE) 220 MG tablet Take 220 mg by mouth 2 (two) times daily as needed (pain).     . nitroGLYCERIN (NITROSTAT) 0.4 MG SL tablet Place 0.4 mg under the tongue every 5 (five) minutes as needed for chest pain.    Marland Kitchen Oxymetazoline HCl (AFRIN NASAL SPRAY NA) Place 2 sprays into the nose 2 (two) times daily as needed.     . pantoprazole (PROTONIX) 40 MG tablet Take 40 mg by mouth at bedtime.     . pravastatin (PRAVACHOL) 20 MG tablet TAKE 1 TABLET(20 MG) BY MOUTH DAILY (Patient taking differently: Take 20 mg by mouth daily. ) 90 tablet 3  . Probiotic Product (PROBIOTIC DAILY PO) Take 1 tablet by mouth daily.    . tamsulosin (FLOMAX) 0.4 MG CAPS capsule Take 0.4 mg by mouth in the morning and at bedtime.   3  . ursodiol (ACTIGALL) 250 MG tablet TAKE 1 TABLET(250 MG) BY MOUTH IN THE MORNING AND AT BEDTIME (Patient taking differently: Take 250 mg by mouth 2 (two) times daily. ) 90 tablet 1   No current facility-administered medications for this visit.    ALLERGIES:  Allergies  Allergen Reactions  . Contrast Media [Iodinated Diagnostic Agents]     Hives, needs pre meds  . Metrizamide Hives    Hives, needs pre meds  . Other Itching and Other (See Comments)    Contrast dye erythema    PHYSICAL EXAM:    Performance status (ECOG): 1 - Symptomatic but completely ambulatory  Vitals:   03/28/20 1552  BP: 123/75  Pulse: 70  Resp: 18  Temp: (!) 97.2 F (36.2 C)  SpO2: 100%   Wt Readings from Last 3 Encounters:  03/28/20 181 lb (82.1 kg)  03/15/20 178 lb (80.7 kg)  02/11/20 175 lb (79.4 kg)   Physical Exam Vitals reviewed.  Constitutional:      Appearance: Normal appearance.  Neurological:     General: No focal deficit present.     Mental Status: He is alert and oriented to person, place, and time.  Psychiatric:        Mood and Affect: Mood normal.        Behavior: Behavior normal.     LABORATORY DATA:  I have reviewed the labs as listed.  CBC Latest Ref Rng & Units 03/15/2020 08/04/2019 08/03/2019  WBC 4.0 - 10.5 K/uL 4.5 6.9 13.0(H)  Hemoglobin 13.0 - 17.0 g/dL 12.0(L) 11.4(L) 13.2  Hematocrit 39 - 52 % 37.9(L) 36.4(L) 41.2  Platelets 150 - 400 K/uL 64(L) 51(L) 82(L)   CMP Latest Ref Rng & Units 08/03/2019 10/08/2017 07/12/2015  Glucose 70 - 99 mg/dL 159(H) 88 -  BUN 8 - 23 mg/dL 18 15 -  Creatinine 0.61 - 1.24 mg/dL 1.29(H) 1.02 -  Sodium 135 - 145 mmol/L 137 140 -  Potassium 3.5 - 5.1 mmol/L 4.4 4.4 -  Chloride 98 - 111 mmol/L 102 105 -  CO2 22 - 32 mmol/L 25 29 -  Calcium 8.9 - 10.3 mg/dL 8.4(L) 8.8 -  Total Protein 6.5 - 8.1 g/dL 6.7 6.0(L) 6.0(L)  Total Bilirubin 0.3 - 1.2 mg/dL 2.6(H) 1.1 1.3(H)  Alkaline Phos 38 - 126 U/L 212(H) - 101  AST 15 - 41 U/L 60(H) 18 17  ALT 0 -  44 U/L 57(H) 15 10      Component Value Date/Time   RBC 4.42 03/15/2020 1523   RBC 4.23 03/15/2020 1523   MCV 89.6 03/15/2020 1523   MCV 83.4 06/23/2014 1122   MCH 28.4 03/15/2020 1523   MCHC 31.7 03/15/2020 1523   RDW 15.9 (H) 03/15/2020 1523   RDW 16.0 (H) 06/23/2014 1122   LYMPHSABS 0.8 03/15/2020 1523   LYMPHSABS 1.0 06/23/2014 1122   MONOABS 0.3 03/15/2020 1523   MONOABS 0.5 06/23/2014 1122   EOSABS 0.1 03/15/2020 1523   EOSABS 0.1 06/23/2014 1122   BASOSABS 0.0 03/15/2020 1523    BASOSABS 0.0 06/23/2014 1122   Lab Results  Component Value Date   LDH 138 03/15/2020   LDH 218 03/25/2011   Lab Results  Component Value Date   TOTALPROTELP 6.1 03/15/2020   ALBUMINELP 3.2 03/15/2020   A1GS 0.2 03/15/2020   A2GS 0.8 03/15/2020   BETS 1.0 03/15/2020   GAMS 0.9 03/15/2020   MSPIKE 0.3 (H) 03/15/2020   SPEI Comment 03/15/2020    No results found for: KPAFRELGTCHN, LAMBDASER, KAPLAMBRATIO  DIAGNOSTIC IMAGING:  I have independently reviewed the scans and discussed with the patient. No results found.   ASSESSMENT:  1.  Thrombocytopenia: -Patient seen at the request of Dr. Nevada Crane for thrombocytopenia.  Recent labs at his office showed platelet count 61, unable to quantitate due to aggregation. -Review of labs show patient has mild to moderate thrombocytopenia for the last 10 years. -Easy bruising on extremities.  Patient on aspirin 81 mg daily. -No prior history of transfusions.  No hematuria/melena/bleeding per rectum. -He has history of recurrent biliary infections and has been on continuous antibiotics for the last 1 year (Cipro alternating with Augmentin alternating with Keflex). -He reportedly needs surgery for his right inguinal hernia and evaluated by a surgeon at [facility]. -CT AP on 08/06/2019 at [facility] shows splenomegaly measuring 14 cm.  Right hepatectomy.  Percutaneous transhepatic left biliary drain traverses the biliary stent with pigtail along the right hepatic resection margin.  Ill-defined linear hypodensities along the subcapsular left hepatic lobe, possibly dilated bile ducts are chronically thrombosed peripheral portal vein branches. -EGD on 02/11/2020 by Dr. [name] showing benign esophageal stenosis at GE junction, portal hypertensive gastropathy, normal duodenum. -Nutritional deficiency work-up on 03/15/2020 was negative.  SPEP showed 0.3 g of M spike.  Connective tissue disorder work-up and hepatitis B and C was negative.  2.  Paroxysmal  atrial fibrillation: -He is on aspirin 81 mg daily.  He is not on DOAC.  3.  He requires PBD exchange every few months done by Dr. Colonel Bald at [facility].  He is also on continuous antibiotics prescribed by ID at [facility].   PLAN:  1.  Moderate thrombocytopenia: -Previous smear described giant platelets. -CBC on 03/15/2020 with EDTA and citrate tube showed platelet count 64.  ANA, rheumatoid factor, hepatitis B and C was negative.  Nutritional deficiency work-up was also negative.  LDH was normal. -Likely immune mediated thrombocytopenia although MDS is also a possibility.  No intervention necessary at this time as he did not have any bleeding issues.  2.  Monoclonal gammopathy: -Work-up for thrombocytopenia showed M spike of 0.3 g on 03/15/2020. -Discussed monoclonal gammopathy in detail with the patient and his wife. -Recommend serum immunofixation, free light chains, beta-2 microglobulin, LDH, CMP.  We will also order skeletal survey. -RTC 2 weeks to discuss results.  Orders placed this encounter:  No orders of the defined types were placed  in this encounter.    Derek Jack, MD [place] [phone]   I, Milinda Antis, am acting as a scribe for Dr. Sanda Linger.  I, Derek Jack MD, have reviewed the above documentation for accuracy and completeness, and I agree with the above.

## 2020-03-29 ENCOUNTER — Other Ambulatory Visit: Payer: Self-pay

## 2020-03-29 ENCOUNTER — Inpatient Hospital Stay (HOSPITAL_COMMUNITY): Payer: Medicare Other

## 2020-03-29 DIAGNOSIS — I4891 Unspecified atrial fibrillation: Secondary | ICD-10-CM | POA: Diagnosis not present

## 2020-03-29 DIAGNOSIS — D472 Monoclonal gammopathy: Secondary | ICD-10-CM | POA: Diagnosis not present

## 2020-03-29 DIAGNOSIS — N400 Enlarged prostate without lower urinary tract symptoms: Secondary | ICD-10-CM | POA: Diagnosis not present

## 2020-03-29 DIAGNOSIS — I2510 Atherosclerotic heart disease of native coronary artery without angina pectoris: Secondary | ICD-10-CM | POA: Diagnosis not present

## 2020-03-29 DIAGNOSIS — J449 Chronic obstructive pulmonary disease, unspecified: Secondary | ICD-10-CM | POA: Diagnosis not present

## 2020-03-29 DIAGNOSIS — E785 Hyperlipidemia, unspecified: Secondary | ICD-10-CM | POA: Diagnosis not present

## 2020-03-29 LAB — COMPREHENSIVE METABOLIC PANEL
ALT: 15 U/L (ref 0–44)
AST: 19 U/L (ref 15–41)
Albumin: 3.5 g/dL (ref 3.5–5.0)
Alkaline Phosphatase: 107 U/L (ref 38–126)
Anion gap: 7 (ref 5–15)
BUN: 13 mg/dL (ref 8–23)
CO2: 27 mmol/L (ref 22–32)
Calcium: 8.7 mg/dL — ABNORMAL LOW (ref 8.9–10.3)
Chloride: 104 mmol/L (ref 98–111)
Creatinine, Ser: 0.92 mg/dL (ref 0.61–1.24)
GFR, Estimated: >60 mL/min (ref >60)
Glucose, Bld: 87 mg/dL (ref 70–99)
Potassium: 4.1 mmol/L (ref 3.5–5.1)
Sodium: 138 mmol/L (ref 135–145)
Total Bilirubin: 1 mg/dL (ref 0.3–1.2)
Total Protein: 6.4 g/dL — ABNORMAL LOW (ref 6.5–8.1)

## 2020-03-29 LAB — LACTATE DEHYDROGENASE: LDH: 126 U/L (ref 98–192)

## 2020-03-30 DIAGNOSIS — H04123 Dry eye syndrome of bilateral lacrimal glands: Secondary | ICD-10-CM | POA: Diagnosis not present

## 2020-03-30 DIAGNOSIS — Z961 Presence of intraocular lens: Secondary | ICD-10-CM | POA: Diagnosis not present

## 2020-03-30 DIAGNOSIS — H353132 Nonexudative age-related macular degeneration, bilateral, intermediate dry stage: Secondary | ICD-10-CM | POA: Diagnosis not present

## 2020-03-30 DIAGNOSIS — H02831 Dermatochalasis of right upper eyelid: Secondary | ICD-10-CM | POA: Diagnosis not present

## 2020-03-30 DIAGNOSIS — H02834 Dermatochalasis of left upper eyelid: Secondary | ICD-10-CM | POA: Diagnosis not present

## 2020-03-30 DIAGNOSIS — H1013 Acute atopic conjunctivitis, bilateral: Secondary | ICD-10-CM | POA: Diagnosis not present

## 2020-03-30 LAB — KAPPA/LAMBDA LIGHT CHAINS
Kappa free light chain: 32.3 mg/L — ABNORMAL HIGH (ref 3.3–19.4)
Kappa, lambda light chain ratio: 0.85 (ref 0.26–1.65)
Lambda free light chains: 37.9 mg/L — ABNORMAL HIGH (ref 5.7–26.3)

## 2020-03-30 LAB — BETA 2 MICROGLOBULIN, SERUM: Beta-2 Microglobulin: 3.2 mg/L — ABNORMAL HIGH (ref 0.6–2.4)

## 2020-03-31 LAB — IMMUNOFIXATION ELECTROPHORESIS
IgA: 368 mg/dL (ref 61–437)
IgG (Immunoglobin G), Serum: 757 mg/dL (ref 603–1613)
IgM (Immunoglobulin M), Srm: 97 mg/dL (ref 15–143)
Total Protein ELP: 6 g/dL (ref 6.0–8.5)

## 2020-04-04 ENCOUNTER — Other Ambulatory Visit (HOSPITAL_COMMUNITY): Payer: Self-pay | Admitting: *Deleted

## 2020-04-04 ENCOUNTER — Inpatient Hospital Stay (HOSPITAL_BASED_OUTPATIENT_CLINIC_OR_DEPARTMENT_OTHER): Payer: Medicare Other | Admitting: Hematology

## 2020-04-04 ENCOUNTER — Encounter (HOSPITAL_COMMUNITY): Payer: Self-pay | Admitting: Hematology

## 2020-04-04 ENCOUNTER — Other Ambulatory Visit: Payer: Self-pay

## 2020-04-04 VITALS — BP 134/76 | HR 66 | Temp 97.2°F | Resp 18 | Wt 183.2 lb

## 2020-04-04 DIAGNOSIS — D696 Thrombocytopenia, unspecified: Secondary | ICD-10-CM

## 2020-04-04 DIAGNOSIS — D472 Monoclonal gammopathy: Secondary | ICD-10-CM | POA: Diagnosis not present

## 2020-04-04 DIAGNOSIS — N400 Enlarged prostate without lower urinary tract symptoms: Secondary | ICD-10-CM | POA: Diagnosis not present

## 2020-04-04 DIAGNOSIS — I2510 Atherosclerotic heart disease of native coronary artery without angina pectoris: Secondary | ICD-10-CM | POA: Diagnosis not present

## 2020-04-04 DIAGNOSIS — J449 Chronic obstructive pulmonary disease, unspecified: Secondary | ICD-10-CM | POA: Diagnosis not present

## 2020-04-04 DIAGNOSIS — E785 Hyperlipidemia, unspecified: Secondary | ICD-10-CM | POA: Diagnosis not present

## 2020-04-04 DIAGNOSIS — I4891 Unspecified atrial fibrillation: Secondary | ICD-10-CM | POA: Diagnosis not present

## 2020-04-04 MED ORDER — DEXAMETHASONE 4 MG PO TABS: 40.0000 mg | ORAL_TABLET | Freq: Every day | ORAL | 0 refills | Status: DC

## 2020-04-04 NOTE — Patient Instructions (Signed)
[place] at [facility] Discharge Instructions  You were seen today by Dr. Delton Coombes. He went over your recent results and scans. Your condition is called MGUS (monoclonal gammopathy of undetermined significance) which can proceed into myeloma and will require regular follow-ups. You will be prescribed Decadron to take 10 tablets every morning for 4 days and recheck your platelets afterwards. Dr. Delton Coombes will contact you in 10 days for follow up.   Thank you for choosing [place] at [facility] to provide your oncology and hematology care.  To afford each patient quality time with our provider, please arrive at least 15 minutes before your scheduled appointment time.   If you have a lab appointment with the [place] please come in thru the Main Entrance and check in at the main information desk  You need to re-schedule your appointment should you arrive 10 or more minutes late.  We strive to give you quality time with our providers, and arriving late affects you and other patients whose appointments are after yours.  Also, if you no show three or more times for appointments you may be dismissed from the clinic at the providers discretion.     Again, thank you for choosing [facility].  Our hope is that these requests will decrease the amount of time that you wait before being seen by our physicians.       _____________________________________________________________  Should you have questions after your visit to [facility], please contact our office at [phone] between the hours of 8:00 a.m. and 4:30 p.m.  Voicemails left after 4:00 p.m. will not be returned until the following business day.  For prescription refill requests, have your pharmacy contact our office and allow 72 hours.    Cancer Center Support Programs:   > Cancer Support Group  2nd Tuesday of the month 1pm-2pm, Journey Room

## 2020-04-04 NOTE — Progress Notes (Signed)
Alexander Burton, [place] [address]   CLINIC:  Medical Oncology/Hematology  PCP:  Celene Squibb, MD [address] [place] [address]  [phone]  REASON FOR VISIT:  Follow-up for MGUS  PRIOR THERAPY: None  CURRENT THERAPY: Under work-up  INTERVAL HISTORY:  [name], a 79 y.o. male, returns for routine follow-up for his MGUS. Alexander Burton was last seen on 03/28/2020.  Today he is accompanied by his wife and he reports feeling well. He was sent here from [facility] after his hernia surgery was cancelled due to low platelets. He has taken prednisone previously and tolerated it well. He reports having easy bruising and bleeding if he scratches himself, but denies nosebleeds, hematochezia or hematuria. He flushes his liver stent TID.  He will have a dentist follow-up for routine cleaning on 11/11.   REVIEW OF SYSTEMS:  Review of Systems  Constitutional: Positive for appetite change (50%) and fatigue (50%).  HENT:   Negative for nosebleeds.   Respiratory: Positive for shortness of breath (w/ exertion).   Gastrointestinal: Negative for blood in stool.  Genitourinary: Negative for hematuria.   Hematological: Bruises/bleeds easily (easy bruising and bleeding w/ scratching).  All other systems reviewed and are negative.   PAST MEDICAL/SURGICAL HISTORY:  Past Medical History:  Diagnosis Date  . BPH (benign prostatic hyperplasia)   . CAD (coronary artery disease)    DES to LAD 06/2008  . Colon polyps   . COPD (chronic obstructive pulmonary disease) ([place])   . Enlarged prostate   . Gastritis   . 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 History of syncope January 2010   PVC's and14 beats NSVT as well as atrial afib  . Hyperlipidemia   . Melanoma ([place])   . PAF (paroxysmal atrial fibrillation) ([place])   . Sleep apnea 04/02/2005   Past Surgical History:  Procedure Laterality Date  . CARDIAC CATHETERIZATION  Feb  2010   post DES to LAD ,3.5 X 33 mm Cypher stent,dilated up to 3.8 mm 80 to90% stenosis btwn first and second diag branc w.moderate disease EF 50%  . CHOLECYSTECTOMY     Complicated by rupture of common bile duct and other vascular structures. Has now had multiple issues with chronic hepatic abscess. Currently has indwelling drain  . COLONOSCOPY  08/16/2011   Procedure: COLONOSCOPY;  Surgeon: Rogene Houston, MD;  [facility];  Service: Endoscopy;  Laterality: N/A;  1200  . ESOPHAGEAL DILATION N/A 01/20/2015   Procedure: ESOPHAGEAL DILATION;  Surgeon: Rogene Houston, MD;  [facility];  Service: Endoscopy;  Laterality: N/A;  . ESOPHAGEAL DILATION N/A 02/11/2020   Procedure: ESOPHAGEAL DILATION;  Surgeon: Rogene Houston, MD;  [facility];  Service: Endoscopy;  Laterality: N/A;  . ESOPHAGOGASTRODUODENOSCOPY N/A 01/20/2015   Procedure: ESOPHAGOGASTRODUODENOSCOPY (EGD);  Surgeon: Rogene Houston, MD;  [facility];  Service: Endoscopy;  Laterality: N/A;  1200  . ESOPHAGOGASTRODUODENOSCOPY N/A 02/11/2020   Procedure: ESOPHAGOGASTRODUODENOSCOPY (EGD);  Surgeon: Rogene Houston, MD;  [facility];  Service: Endoscopy;  Laterality: N/A;  730  . LEA DOPPLER  07/28/2008   NORMAL RGT GROIN DUPLEX DOPPLER  . liver stent    . LIVER SURGERY  09-03/14   @ [facility]  . Melanoma resection      SOCIAL HISTORY:  Social History   Socioeconomic History  . Marital status: Married  Spouse name: Not on file  . Number of children: 2  . Years of education: Not on file  . Highest education level: Not on file  Occupational History    Employer: RETIRED  Tobacco Use  . Smoking status: Former Smoker    Packs/day: 1.50    Years: 50.00    Pack years: 75.00    Types: Cigarettes    Quit date: 05/29/2007    Years since quitting: 12.8  . Smokeless tobacco: Never Used  Vaping Use  . Vaping Use: Never used  Substance and Sexual Activity  . Alcohol use: Yes     Alcohol/week: 1.0 standard drink    Types: 1 Standard drinks or equivalent per week    Comment: very rare  . Drug use: No  . Sexual activity: Not Currently  Other Topics Concern  . Not on file  Social History Narrative   Married father to come a grandfather 14. 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 Has more fatigue with walking but still able to walk up to 2 miles prior to his most recent hospitalization.   Social Determinants of Health   Financial Resource Strain: Low Risk   . Difficulty of Paying Living Expenses: Not hard at all  Food Insecurity: No Food Insecurity  . Worried About Charity fundraiser in the Last Year: Never true  . Ran Out of Food in the Last Year: Never true  Transportation Needs: No Transportation Needs  . Lack of Transportation (Medical): No  . Lack of Transportation (Non-Medical): No  Physical Activity: Inactive  . Days of Exercise per Week: 0 days  . Minutes of Exercise per Session: 0 min  Stress: No Stress Concern Present  . Feeling of Stress : Only a little  Social Connections: Moderately Integrated  . Frequency of Communication with Friends and Family: More than three times a week  . Frequency of Social Gatherings with Friends and Family: More than three times a week  . Attends Religious Services: More than 4 times per year  . Active Member of Clubs or Organizations: No  . Attends Archivist Meetings: Never  . Marital Status: Married  Human resources officer Violence: Not At Risk  . Fear of Current or Ex-Partner: No  . Emotionally Abused: No  . Physically Abused: No  . Sexually Abused: No    FAMILY HISTORY:  Family History  Problem Relation Age of Onset  . Colon cancer Sister   . Heart disease Mother   . Emphysema Father     CURRENT MEDICATIONS:  Current Outpatient Medications  Medication Sig Dispense Refill  . amoxicillin-clavulanate (AUGMENTIN) 875-125 MG tablet Take 1 tablet by mouth 2 (two) times daily. For 30 days  -  will start Augmentin in May    . amoxicillin-clavulanate (AUGMENTIN) 875-125 MG tablet Take by mouth.    Marland Kitchen aspirin EC 81 MG tablet Take 1 tablet (81 mg total) by mouth every evening. 30 tablet 11  . cephALEXin (KEFLEX) 500 MG capsule Take 500 mg by mouth 2 (two) times daily.    . ciprofloxacin (CIPRO) 500 MG tablet Take 1 tablet by mouth as directed. Patient will take 1 tablet twice a day for 1 month; current regimen is Bactrim - will start Cipro in April    . ciprofloxacin (CIPRO) 500 MG tablet Take by mouth.    . cycloSPORINE (RESTASIS) 0.05 % ophthalmic emulsion Place 1 drop into both eyes 2 (two) times daily.     . finasteride (  PROSCAR) 5 MG tablet Take 5 mg by mouth daily.     . fluorouracil (EFUDEX) 5 % cream Apply topically daily.    . metoprolol tartrate (LOPRESSOR) 25 MG tablet TAKE 1/2 TABLET(12.5 MG) BY MOUTH TWICE DAILY (Patient taking differently: Take 12.5 mg by mouth 2 (two) times daily. ) 90 tablet 3  . mirtazapine (REMERON) 7.5 MG tablet Take 7.5 mg by mouth at bedtime.    . Multiple Vitamin (MULITIVITAMIN WITH MINERALS) TABS Take 1 tablet by mouth daily. Centrum Silver men 50+    . Multiple Vitamins-Minerals (PRESERVISION AREDS) CAPS Take 1 capsule by mouth in the morning and at bedtime.     . naproxen sodium (ALEVE) 220 MG tablet Take 220 mg by mouth 2 (two) times daily as needed (pain).     . nitroGLYCERIN (NITROSTAT) 0.4 MG SL tablet Place 0.4 mg under the tongue every 5 (five) minutes as needed for chest pain.    Marland Kitchen Oxymetazoline HCl (AFRIN NASAL SPRAY NA) Place 2 sprays into the nose 2 (two) times daily as needed.     . pantoprazole (PROTONIX) 40 MG tablet Take 40 mg by mouth at bedtime.     . pravastatin (PRAVACHOL) 20 MG tablet TAKE 1 TABLET(20 MG) BY MOUTH DAILY (Patient taking differently: Take 20 mg by mouth daily. ) 90 tablet 3  . Probiotic Product (PROBIOTIC DAILY PO) Take 1 tablet by mouth daily.    . tamsulosin (FLOMAX) 0.4 MG CAPS capsule Take 0.4 mg by mouth in the  morning and at bedtime.   3  . ursodiol (ACTIGALL) 250 MG tablet TAKE 1 TABLET(250 MG) BY MOUTH IN THE MORNING AND AT BEDTIME (Patient taking differently: Take 250 mg by mouth 2 (two) times daily. ) 90 tablet 1   No current facility-administered medications for this visit.    ALLERGIES:  Allergies  Allergen Reactions  . Contrast Media [Iodinated Diagnostic Agents]     Hives, needs pre meds  . Metrizamide Hives    Hives, needs pre meds  . Other Itching and Other (See Comments)    Contrast dye erythema    PHYSICAL EXAM:  Performance status (ECOG): 1 - Symptomatic but completely ambulatory  Vitals:   04/04/20 1132  BP: 134/76  Pulse: 66  Resp: 18  Temp: (!) 97.2 F (36.2 C)  SpO2: 100%   Wt Readings from Last 3 Encounters:  04/04/20 183 lb 3.2 oz (83.1 kg)  03/28/20 181 lb (82.1 kg)  03/15/20 178 lb (80.7 kg)   Physical Exam Vitals reviewed.  Constitutional:      Appearance: Normal appearance.  Cardiovascular:     Rate and Rhythm: Normal rate and regular rhythm.     Pulses: Normal pulses.     Heart sounds: Normal heart sounds.  Pulmonary:     Effort: Pulmonary effort is normal.     Breath sounds: Normal breath sounds.  Neurological:     General: No focal deficit present.     Mental Status: He is alert and oriented to person, place, and time.  Psychiatric:        Mood and Affect: Mood normal.        Behavior: Behavior normal.     LABORATORY DATA:  I have reviewed the labs as listed.  CBC Latest Ref Rng & Units 03/15/2020 08/04/2019 08/03/2019  WBC 4.0 - 10.5 K/uL 4.5 6.9 13.0(H)  Hemoglobin 13.0 - 17.0 g/dL 12.0(L) 11.4(L) 13.2  Hematocrit 39 - 52 % 37.9(L) 36.4(L) 41.2  Platelets 150 -  400 K/uL 64(L) 51(L) 82(L)   CMP Latest Ref Rng & Units 03/29/2020 08/03/2019 10/08/2017  Glucose 70 - 99 mg/dL 87 159(H) 88  BUN 8 - 23 mg/dL '13 18 15  ' Creatinine 0.61 - 1.24 mg/dL 0.92 1.29(H) 1.02  Sodium 135 - 145 mmol/L 138 137 140  Potassium 3.5 - 5.1 mmol/L 4.1 4.4 4.4    Chloride 98 - 111 mmol/L 104 102 105  CO2 22 - 32 mmol/L '27 25 29  ' Calcium 8.9 - 10.3 mg/dL 8.7(L) 8.4(L) 8.8  Total Protein 6.5 - 8.1 g/dL 6.4(L) 6.7 6.0(L)  Total Bilirubin 0.3 - 1.2 mg/dL 1.0 2.6(H) 1.1  Alkaline Phos 38 - 126 U/L 107 212(H) -  AST 15 - 41 U/L 19 60(H) 18  ALT 0 - 44 U/L 15 57(H) 15      Component Value Date/Time   RBC 4.42 03/15/2020 1523   RBC 4.23 03/15/2020 1523   MCV 89.6 03/15/2020 1523   MCV 83.4 06/23/2014 1122   MCH 28.4 03/15/2020 1523   MCHC 31.7 03/15/2020 1523   RDW 15.9 (H) 03/15/2020 1523   RDW 16.0 (H) 06/23/2014 1122   LYMPHSABS 0.8 03/15/2020 1523   LYMPHSABS 1.0 06/23/2014 1122   MONOABS 0.3 03/15/2020 1523   MONOABS 0.5 06/23/2014 1122   EOSABS 0.1 03/15/2020 1523   EOSABS 0.1 06/23/2014 1122   BASOSABS 0.0 03/15/2020 1523   BASOSABS 0.0 06/23/2014 1122   Lab Results  Component Value Date   LDH 126 03/29/2020   LDH 138 03/15/2020   LDH 218 03/25/2011   Lab Results  Component Value Date   TOTALPROTELP 6.0 03/29/2020   ALBUMINELP 3.2 03/15/2020   A1GS 0.2 03/15/2020   A2GS 0.8 03/15/2020   BETS 1.0 03/15/2020   GAMS 0.9 03/15/2020   MSPIKE 0.3 (H) 03/15/2020   SPEI Comment 03/15/2020    Lab Results  Component Value Date   KPAFRELGTCHN 32.3 (H) 03/29/2020   LAMBDASER 37.9 (H) 03/29/2020   KAPLAMBRATIO 0.85 03/29/2020    DIAGNOSTIC IMAGING:  I have independently reviewed the scans and discussed with the patient. DG Bone Survey Met  Result Date: 03/29/2020 CLINICAL DATA:  Monoclonal gammopathy, history of prior prostate carcinoma EXAM: METASTATIC BONE SURVEY COMPARISON:  None. FINDINGS: Metastatic bone survey was performed. Lateral view of the skull shows no significant lucencies. Upper extremities show no significant bony lucencies. Degenerative changes of the cervical spine are noted. No lytic areas are identified. Thoracic spine demonstrates degenerative change without focal lytic lesion. No paraspinal mass is noted.  Lumbar spine demonstrates mild degenerative change. Scattered sclerotic foci are noted similar to that seen on prior CT from 2015 likely related to prior metastatic disease from the patient's known history of prostate carcinoma. Lungs are clear. No definitive rib lucencies are seen. Drainage catheter is noted in the right upper quadrant. Lower extremities show no lytic lesions. Scattered sclerotic foci are noted stable from previous exams consistent with known history of prostate carcinoma. Similar findings are noted throughout the pelvis. IMPRESSION: No lytic lesions are identified to correspond with the current history. Scattered sclerotic foci are again noted and stable from 2015 consistent with prior metastatic disease. Electronically Signed   By: Inez Catalina M.D.   On: 03/29/2020 23:16     ASSESSMENT:  1. Thrombocytopenia: -Patient seen at the request of Dr. Nevada Crane for thrombocytopenia. Recent labs at his office showed platelet count 61, unable to quantitate due to aggregation. -Review of labs show patient has mild to moderate thrombocytopenia for  the last 10 years. -Easy bruising on extremities. Patient on aspirin 81 mg daily. -No prior history of transfusions. No hematuria/melena/bleeding per rectum. -He has history of recurrent biliary infections and has been on continuous antibiotics for the last 1 year (Cipro alternating with Augmentin alternating with Keflex). -He reportedly needs surgery for his right inguinal hernia and evaluated by a surgeon at [facility]. -CT AP on 08/06/2019 at [facility] shows splenomegaly measuring 14 cm. Right hepatectomy. Percutaneous transhepatic left biliary drain traverses the biliary stent with pigtail along the right hepatic resection margin. Ill-defined linear hypodensities along the subcapsular left hepatic lobe, possibly dilated bile ducts are chronically thrombosed peripheral portal vein branches. -EGD on 02/11/2020 by Dr.Rehmanshowing benign esophageal  stenosis at GE junction, portal hypertensive gastropathy, normal duodenum. -Nutritional deficiency work-up on 03/15/2020 was negative.  SPEP showed 0.3 g of M spike.  Connective tissue disorder work-up and hepatitis B and C was negative.  2. Paroxysmal atrial fibrillation: -He is on aspirin 81 mg daily. He is not on DOAC.  3. He requires PBD exchange every few months done by Dr. [facility] [employer]. He is also on continuous antibiotics prescribed by ID at [facility].  4.  IgG lambda MGUS: -Diagnosed on 03/15/2020, 0.3 g/dL.  Kappa light chains 32.3, lambda light chains 37.9, ratio 0.85.  Beta-2 microglobulin 3.2.  LDH 126. -Skeletal survey on 03/28/2020 was negative for lytic disease.   PLAN:  1.Moderate thrombocytopenia: -Differential diagnosis includes immune thrombocytopenia versus MDS. -Patient reports that he wants to have hernia surgery in the right inguinal region.  His surgeon at [facility] apparently wants higher platelet count. -I will give a trial of dexamethasone 40 mg x 4 days.  I plan to repeat his platelets in 10 days and see if there is any improvement. -If he does show response to steroids, we can use it prior to his surgery. -If he does not respond, he will likely need bone marrow aspiration. -We will set up a phone visit in 10 days to discuss platelet count.  2.  Monoclonal gammopathy: -We reviewed the results of the blood work including serum immunofixation and free light chains. -We also reviewed skeletal survey results which did not show any evidence of bone lesions. -As he does not have "crab" lesions, he will be considered as having MGUS and will be followed at 6 monthly intervals.  Orders placed this encounter:  No orders of the defined types were placed in this encounter.    Derek Jack, MD [place] [phone]   I, Milinda Antis, am acting as a scribe for Dr. Sanda Linger.  I, Derek Jack MD, have reviewed  the above documentation for accuracy and completeness, and I agree with the above.

## 2020-04-14 ENCOUNTER — Other Ambulatory Visit: Payer: Self-pay

## 2020-04-14 ENCOUNTER — Inpatient Hospital Stay (HOSPITAL_COMMUNITY): Payer: Medicare Other

## 2020-04-14 DIAGNOSIS — D472 Monoclonal gammopathy: Secondary | ICD-10-CM | POA: Diagnosis not present

## 2020-04-14 DIAGNOSIS — D696 Thrombocytopenia, unspecified: Secondary | ICD-10-CM

## 2020-04-14 DIAGNOSIS — I4891 Unspecified atrial fibrillation: Secondary | ICD-10-CM | POA: Diagnosis not present

## 2020-04-14 DIAGNOSIS — I2510 Atherosclerotic heart disease of native coronary artery without angina pectoris: Secondary | ICD-10-CM | POA: Diagnosis not present

## 2020-04-14 DIAGNOSIS — J449 Chronic obstructive pulmonary disease, unspecified: Secondary | ICD-10-CM | POA: Diagnosis not present

## 2020-04-14 DIAGNOSIS — E785 Hyperlipidemia, unspecified: Secondary | ICD-10-CM | POA: Diagnosis not present

## 2020-04-14 DIAGNOSIS — N400 Enlarged prostate without lower urinary tract symptoms: Secondary | ICD-10-CM | POA: Diagnosis not present

## 2020-04-14 LAB — CBC WITH DIFFERENTIAL/PLATELET
Abs Immature Granulocytes: 0.03 10*3/uL (ref 0.00–0.07)
Basophils Absolute: 0 10*3/uL (ref 0.0–0.1)
Basophils Relative: 0 %
Eosinophils Absolute: 0.1 10*3/uL (ref 0.0–0.5)
Eosinophils Relative: 1 %
HCT: 38 % — ABNORMAL LOW (ref 39.0–52.0)
Hemoglobin: 11.8 g/dL — ABNORMAL LOW (ref 13.0–17.0)
Immature Granulocytes: 1 %
Lymphocytes Relative: 14 %
Lymphs Abs: 0.7 10*3/uL (ref 0.7–4.0)
MCH: 27.7 pg (ref 26.0–34.0)
MCHC: 31.1 g/dL (ref 30.0–36.0)
MCV: 89.2 fL (ref 80.0–100.0)
Monocytes Absolute: 0.6 10*3/uL (ref 0.1–1.0)
Monocytes Relative: 10 %
Neutro Abs: 4 10*3/uL (ref 1.7–7.7)
Neutrophils Relative %: 74 %
Platelets: 61 10*3/uL — ABNORMAL LOW (ref 150–400)
RBC: 4.26 MIL/uL (ref 4.22–5.81)
RDW: 15.9 % — ABNORMAL HIGH (ref 11.5–15.5)
WBC: 5.4 10*3/uL (ref 4.0–10.5)
nRBC: 0 % (ref 0.0–0.2)

## 2020-04-14 LAB — PLATELET BY CITRATE

## 2020-04-15 DIAGNOSIS — Z23 Encounter for immunization: Secondary | ICD-10-CM | POA: Diagnosis not present

## 2020-04-20 ENCOUNTER — Other Ambulatory Visit (HOSPITAL_COMMUNITY): Payer: Self-pay

## 2020-04-20 ENCOUNTER — Inpatient Hospital Stay (HOSPITAL_BASED_OUTPATIENT_CLINIC_OR_DEPARTMENT_OTHER): Payer: Medicare Other | Admitting: Hematology

## 2020-04-20 ENCOUNTER — Other Ambulatory Visit: Payer: Self-pay

## 2020-04-20 DIAGNOSIS — D472 Monoclonal gammopathy: Secondary | ICD-10-CM | POA: Diagnosis not present

## 2020-04-20 DIAGNOSIS — D696 Thrombocytopenia, unspecified: Secondary | ICD-10-CM

## 2020-04-20 NOTE — Progress Notes (Signed)
Virtual Visit via Telephone Note  I connected with Alexander Burton on 04/20/20 at  1:00 PM EST by telephone and verified that I am speaking with the correct person using two identifiers.  Location: Patient: At home Provider: In the office   I discussed the limitations, risks, security and privacy concerns of performing an evaluation and management service by telephone and the availability of in person appointments. I also discussed with the patient that there may be a patient responsible charge related to this service. The patient expressed understanding and agreed to proceed.   History of Present Illness: This patient is evaluated in our office for history of thrombocytopenia, mild to moderate for the last 10 years.  He is on aspirin 81 mg daily and has easy bruising on extremities.  No vaginal bleeding issues after his previous surgeries.   Observations/Objective: I have given a trial of dexamethasone 40 mg daily for 4 days starting 04/05/2020.  He reportedly tolerated dexamethasone very well without any major side effects.  Assessment and Plan:  1.  Moderate thrombocytopenia: -He was treated with dexamethasone 40 mg for 4 days on 04/05/2020. -I have reviewed CBC from 04/14/2020 which shows platelet count 61.  Platelet count prior to dexamethasone was 64. -As he did not have any response to steroids, I have recommended bone marrow aspiration and biopsy. -Patient would like to have it done after the holidays.  We will schedule him in the second week of January. -I will see him back in 2 weeks after the biopsy. -His wife tells me that he is having PBD exchange soon.  His current platelet count is adequate for the procedure.  2.  IgG lambda MGUS: -Last skeletal survey from 03/28/2020 did not show any lytic lesions. -He does not have any "crab" features.  We will continue monitoring labs every 6 months.   Follow Up Instructions:   RTC 2 weeks after the bone marrow biopsy. I discussed  the assessment and treatment plan with the patient. The patient was provided an opportunity to ask questions and all were answered. The patient agreed with the plan and demonstrated an understanding of the instructions.   The patient was advised to call back or seek an in-person evaluation if the symptoms worsen or if the condition fails to improve as anticipated.  I provided 11 minutes of non-face-to-face time during this encounter.   Sreedhar Katragadda, MD   

## 2020-04-20 NOTE — Addendum Note (Signed)
Addended by: Donetta Potts on: 04/20/2020 03:34 PM   Modules accepted: Orders

## 2020-05-24 ENCOUNTER — Telehealth (INDEPENDENT_AMBULATORY_CARE_PROVIDER_SITE_OTHER): Payer: Self-pay | Admitting: Internal Medicine

## 2020-05-24 ENCOUNTER — Other Ambulatory Visit (INDEPENDENT_AMBULATORY_CARE_PROVIDER_SITE_OTHER): Payer: Self-pay | Admitting: *Deleted

## 2020-05-24 MED ORDER — URSODIOL 250 MG PO TABS: ORAL_TABLET | ORAL | 3 refills | Status: DC

## 2020-05-24 NOTE — Telephone Encounter (Signed)
Per Dr.Rehman may fill for #90 with 3 refills. This will be e scribed to the patient's pharmacy. Will ask them to contact he patient when this is ready.

## 2020-05-24 NOTE — Telephone Encounter (Signed)
I will address refill with Dr.Rehman.

## 2020-05-24 NOTE — Telephone Encounter (Signed)
Spouse left voice mail message regarding refill request for ursodiol - please advise - ph# [phone]

## 2020-05-25 ENCOUNTER — Ambulatory Visit (INDEPENDENT_AMBULATORY_CARE_PROVIDER_SITE_OTHER): Payer: Medicare Other | Admitting: Ophthalmology

## 2020-05-25 ENCOUNTER — Other Ambulatory Visit: Payer: Self-pay

## 2020-05-25 ENCOUNTER — Encounter (INDEPENDENT_AMBULATORY_CARE_PROVIDER_SITE_OTHER): Payer: Self-pay | Admitting: Ophthalmology

## 2020-05-25 DIAGNOSIS — H35361 Drusen (degenerative) of macula, right eye: Secondary | ICD-10-CM | POA: Insufficient documentation

## 2020-05-25 DIAGNOSIS — H43822 Vitreomacular adhesion, left eye: Secondary | ICD-10-CM | POA: Insufficient documentation

## 2020-05-25 DIAGNOSIS — H353132 Nonexudative age-related macular degeneration, bilateral, intermediate dry stage: Secondary | ICD-10-CM | POA: Diagnosis not present

## 2020-05-25 DIAGNOSIS — Z961 Presence of intraocular lens: Secondary | ICD-10-CM | POA: Diagnosis not present

## 2020-05-25 DIAGNOSIS — H43821 Vitreomacular adhesion, right eye: Secondary | ICD-10-CM | POA: Diagnosis not present

## 2020-05-25 NOTE — Assessment & Plan Note (Signed)
Vitreomacular traction may cause vision loss from anatomic distortion to the center of the vision, the macula.  If visual function is symptomatic or threatened, therapy may be needed.  Surgical intervention offers the highest chance of visual stability and improvement.  Distortion of the macula anatomy may cause splitting of the retinal layers, termed foveomacular retinoschisis, which can cause more permanent vision loss.  Epiretinal membranes may also be associated.  Macular hole may also develop if vitreomacular traction progresses. The minor form of this condition is Vitreomacular adhesion, which is a natural change in the aging process of the eye, which requires observation only. 

## 2020-05-25 NOTE — Progress Notes (Signed)
05/25/2020     CHIEF COMPLAINT Patient presents for Retina Follow Up (1 Year f\u OU. OCT/Pt denies changes in vision. Pt states he does not like to drive at night due to the lights.)   HISTORY OF PRESENT ILLNESS: Alexander Burton is a 79 y.o. male who presents to the clinic today for:   HPI    Retina Follow Up    Patient presents with  Dry AMD.  In both eyes.  Severity is moderate.  Duration of 1 year.  Since onset it is stable.  I, the attending physician,  performed the HPI with the patient and updated documentation appropriately. Additional comments: 1 Year f\u OU. OCT Pt denies changes in vision. Pt states he does not like to drive at night due to the lights.       Last edited by Elyse Jarvis on 05/25/2020 10:28 AM. (History)      Referring physician: Benita Stabile, MD [address] [place],  [place] [address]  HISTORICAL INFORMATION:   Selected notes from the MEDICAL RECORD NUMBER       CURRENT MEDICATIONS: Current Outpatient Medications (Ophthalmic Drugs)  Medication Sig   cycloSPORINE (RESTASIS) 0.05 % ophthalmic emulsion Place 1 drop into both eyes 2 (two) times daily.    No current facility-administered medications for this visit. (Ophthalmic Drugs)   Current Outpatient Medications (Other)  Medication Sig   amoxicillin-clavulanate (AUGMENTIN) 875-125 MG tablet Take 1 tablet by mouth 2 (two) times daily. For 30 days  - will start Augmentin in May   aspirin EC 81 MG tablet Take 1 tablet (81 mg total) by mouth every evening.   cephALEXin (KEFLEX) 500 MG capsule Take 500 mg by mouth 2 (two) times daily.   ciprofloxacin (CIPRO) 500 MG tablet Take 1 tablet by mouth as directed. Patient will take 1 tablet twice a day for 1 month; current regimen is Bactrim - will start Cipro in April   dexamethasone (DECADRON) 4 MG tablet Take 10 tablets (40 mg total) by mouth daily.   finasteride (PROSCAR) 5 MG tablet Take 5 mg by mouth daily.    fluorouracil (EFUDEX) 5  % cream Apply topically daily.   metoprolol tartrate (LOPRESSOR) 25 MG tablet TAKE 1/2 TABLET(12.5 MG) BY MOUTH TWICE DAILY (Patient taking differently: Take 12.5 mg by mouth 2 (two) times daily. )   mirtazapine (REMERON) 7.5 MG tablet Take 7.5 mg by mouth at bedtime.   Multiple Vitamin (MULITIVITAMIN WITH MINERALS) TABS Take 1 tablet by mouth daily. Centrum Silver men 50+   Multiple Vitamins-Minerals (PRESERVISION AREDS) CAPS Take 1 capsule by mouth in the morning and at bedtime.    naproxen sodium (ALEVE) 220 MG tablet Take 220 mg by mouth 2 (two) times daily as needed (pain).    nitroGLYCERIN (NITROSTAT) 0.4 MG SL tablet Place 0.4 mg under the tongue every 5 (five) minutes as needed for chest pain.   Oxymetazoline HCl (AFRIN NASAL SPRAY NA) Place 2 sprays into the nose 2 (two) times daily as needed.    pantoprazole (PROTONIX) 40 MG tablet Take 40 mg by mouth at bedtime.    pravastatin (PRAVACHOL) 20 MG tablet TAKE 1 TABLET(20 MG) BY MOUTH DAILY (Patient taking differently: Take 20 mg by mouth daily. )   Probiotic Product (PROBIOTIC DAILY PO) Take 1 tablet by mouth daily.   tamsulosin (FLOMAX) 0.4 MG CAPS capsule Take 0.4 mg by mouth in the morning and at bedtime.    ursodiol (ACTIGALL) 250 MG  tablet TAKE 1 TABLET(250 MG) BY MOUTH IN THE MORNING AND AT BEDTIME   No current facility-administered medications for this visit. (Other)      REVIEW OF SYSTEMS:    ALLERGIES Allergies  Allergen Reactions   Contrast Media [Iodinated Diagnostic Agents]     Hives, needs pre meds   Metrizamide Hives    Hives, needs pre meds   Other Itching and Other (See Comments)    Contrast dye erythema    PAST MEDICAL HISTORY Past Medical History:  Diagnosis Date   BPH (benign prostatic hyperplasia)    CAD (coronary artery disease)    DES to LAD 06/2008   Colon polyps    COPD (chronic obstructive pulmonary disease) (HCC)    Enlarged prostate    Gastritis    Hepatic abscess,  chronic[572.0]    Resulting from distant surgery   History of syncope January 2010   PVC's and14 beats NSVT as well as atrial afib   Hyperlipidemia    Melanoma (HCC)    PAF (paroxysmal atrial fibrillation) ([place])    Sleep apnea 04/02/2005   Past Surgical History:  Procedure Laterality Date   CARDIAC CATHETERIZATION  Feb 2010   post DES to LAD ,3.5 X 33 mm Cypher stent,dilated up to 3.8 mm 80 to90% stenosis btwn first and second diag branc w.moderate disease EF 50%   CHOLECYSTECTOMY     Complicated by rupture of common bile duct and other vascular structures. Has now had multiple issues with chronic hepatic abscess. Currently has indwelling drain   COLONOSCOPY  08/16/2011   Procedure: COLONOSCOPY;  Surgeon: Rogene Houston, MD;  [facility];  Service: Endoscopy;  Laterality: N/A;  1200   ESOPHAGEAL DILATION N/A 01/20/2015   Procedure: ESOPHAGEAL DILATION;  Surgeon: Rogene Houston, MD;  [facility];  Service: Endoscopy;  Laterality: N/A;   ESOPHAGEAL DILATION N/A 02/11/2020   Procedure: ESOPHAGEAL DILATION;  Surgeon: Rogene Houston, MD;  [facility];  Service: Endoscopy;  Laterality: N/A;   ESOPHAGOGASTRODUODENOSCOPY N/A 01/20/2015   Procedure: ESOPHAGOGASTRODUODENOSCOPY (EGD);  Surgeon: Rogene Houston, MD;  [facility];  Service: Endoscopy;  Laterality: N/A;  1200   ESOPHAGOGASTRODUODENOSCOPY N/A 02/11/2020   Procedure: ESOPHAGOGASTRODUODENOSCOPY (EGD);  Surgeon: Rogene Houston, MD;  [facility];  Service: Endoscopy;  Laterality: N/A;  730   LEA DOPPLER  07/28/2008   NORMAL RGT GROIN DUPLEX DOPPLER   liver stent     LIVER SURGERY  09-03/14   @ [facility]   Melanoma resection      FAMILY HISTORY Family History  Problem Relation Age of Onset   Colon cancer Sister    Heart disease Mother    Emphysema Father     SOCIAL HISTORY Social History   Tobacco Use   Smoking status: Former Smoker    Packs/day:  1.50    Years: 50.00    Pack years: 75.00    Types: Cigarettes    Quit date: 05/29/2007    Years since quitting: 13.0   Smokeless tobacco: Never Used  Vaping Use   Vaping Use: Never used  Substance Use Topics   Alcohol use: Yes    Alcohol/week: 1.0 standard drink    Types: 1 Standard drinks or equivalent per week    Comment: very rare   Drug use: No         OPHTHALMIC EXAM:  Base Eye Exam    Visual Acuity (Snellen - Linear)  Right Left   Dist [place] 20/40 -2 20/40 -2   Dist ph [place] 20/30 -2 20/30 -1       Tonometry (Tonopen, 10:33 AM)      Right Left   Pressure 15 16       Pupils      Pupils Dark Light Shape React APD   Right PERRL 3 3 Round Minimal None   Left PERRL 3 3 Round Minimal None       Visual Fields (Counting fingers)      Left Right    Full Full       Neuro/Psych    Oriented x3: Yes   Mood/Affect: Normal       Dilation    Both eyes: 1.0% Mydriacyl, 2.5% Phenylephrine @ 10:33 AM        Slit Lamp and Fundus Exam    External Exam      Right Left   External Normal Normal       Slit Lamp Exam      Right Left   Lids/Lashes Normal Normal   Conjunctiva/Sclera White and quiet White and quiet   Cornea Clear Clear   Anterior Chamber Deep and quiet Deep and quiet   Iris Round and reactive Round and reactive   Lens Centered posterior chamber intraocular lens Centered posterior chamber intraocular lens   Anterior Vitreous Normal Normal       Fundus Exam      Right Left   Posterior Vitreous Normal Normal   Disc Normal Normal   C/D Ratio 0.5 0.5   Macula Drusen, Intermediate age related macular degeneration no exudates, no hemorrhage   Vessels Normal Normal   Periphery Normal Normal          IMAGING AND PROCEDURES  Imaging and Procedures for 05/25/20  OCT, Retina - OU - Both Eyes       Right Eye Quality was good. Scan locations included subfoveal. Central Foveal Thickness: 276. Progression has been stable. Findings include  vitreomacular adhesion , no IRF, no SRF, retinal drusen .   Left Eye Quality was good. Scan locations included subfoveal. Central Foveal Thickness: 262. Progression has been stable. Findings include no IRF, no SRF, retinal drusen , vitreomacular adhesion .                 ASSESSMENT/PLAN:  Intermediate stage nonexudative age-related macular degeneration of both eyes The nature of age--related macular degeneration was discussed with the patient as well as the distinction between dry and wet types. Checking an Amsler Grid daily with advice to return immediately should a distortion develop, was given to the patient. The patient 's smoking status now and in the past was determined and advice based on the AREDS study was provided regarding the consumption of antioxidant supplements. AREDS 2 vitamin formulation was recommended. Consumption of dark leafy vegetables and fresh fruits of various colors was recommended. Treatment modalities for wet macular degeneration particularly the use of intravitreal injections of anti-blood vessel growth factors was discussed with the patient. Avastin, Lucentis, and Eylea are the available options. On occasion, therapy includes the use of photodynamic therapy and thermal laser. Stressed to the patient do not rub eyes.  Patient was advised to check Amsler Grid daily and return immediately if changes are noted. Instructions on using the grid were given to the patient. All patient questions were answered.  Vitreomacular adhesion of left eye Vitreomacular traction may cause vision loss from anatomic distortion to the center of the vision,  the macula.  If visual function is symptomatic or threatened, therapy may be needed.  Surgical intervention offers the highest chance of visual stability and improvement.  Distortion of the macula anatomy may cause splitting of the retinal layers, termed foveomacular retinoschisis, which can cause more permanent vision loss.  Epiretinal  membranes may also be associated.  Macular hole may also develop if vitreomacular traction progresses. The minor form of this condition is Vitreomacular adhesion, which is a natural change in the aging process of the eye, which requires observation only.      ICD-10-CM   1. Vitreomacular adhesion of left eye  H43.822 OCT, Retina - OU - Both Eyes  2. Vitreomacular adhesion of right eye  H43.821 OCT, Retina - OU - Both Eyes  3. Intermediate stage nonexudative age-related macular degeneration of both eyes  H35.3132 OCT, Retina - OU - Both Eyes  4. Degenerative retinal drusen of right eye  H35.361   5. Pseudophakia of both eyes  Z96.1     1. No interval change in subfoveal drusen, no interval change in affect on the inner retina from vitreomacular effusion, will observe  2.  3.  Ophthalmic Meds Ordered this visit:  No orders of the defined types were placed in this encounter.      Return in about 1 year (around 05/25/2021) for DILATE OU, OCT.  There are no Patient Instructions on file for this visit.   Explained the diagnoses, plan, and follow up with the patient and they expressed understanding.  Patient expressed understanding of the importance of proper follow up care.   Clent Demark Rashanna Christiana M.D. Diseases & Surgery of the Retina and Vitreous Retina [facility] Diabetic [place] 05/25/20     Abbreviations: M myopia (nearsighted); A astigmatism; H hyperopia (farsighted); P presbyopia; Mrx spectacle prescription;  CTL contact lenses; OD right eye; OS left eye; OU both eyes  XT exotropia; ET esotropia; PEK punctate epithelial keratitis; PEE punctate epithelial erosions; DES dry eye syndrome; MGD meibomian gland dysfunction; ATs artificial tears; PFAT's preservative free artificial tears; [place] nuclear sclerotic cataract; PSC posterior subcapsular cataract; ERM epi-retinal membrane; PVD posterior vitreous detachment; RD retinal detachment; DM diabetes mellitus; DR diabetic retinopathy; NPDR  non-proliferative diabetic retinopathy; PDR proliferative diabetic retinopathy; CSME clinically significant macular edema; DME diabetic macular edema; dbh dot blot hemorrhages; CWS cotton wool spot; POAG primary open angle glaucoma; C/D cup-to-disc ratio; HVF humphrey visual field; GVF goldmann visual field; OCT optical coherence tomography; IOP intraocular pressure; BRVO Branch retinal vein occlusion; CRVO central retinal vein occlusion; CRAO central retinal artery occlusion; BRAO branch retinal artery occlusion; RT retinal tear; SB scleral buckle; PPV pars plana vitrectomy; VH Vitreous hemorrhage; PRP panretinal laser photocoagulation; IVK intravitreal kenalog; VMT vitreomacular traction; MH Macular hole;  NVD neovascularization of the disc; NVE neovascularization elsewhere; AREDS age related eye disease study; ARMD age related macular degeneration; POAG primary open angle glaucoma; EBMD epithelial/anterior basement membrane dystrophy; ACIOL anterior chamber intraocular lens; IOL intraocular lens; PCIOL posterior chamber intraocular lens; Phaco/IOL phacoemulsification with intraocular lens placement; [place] photorefractive keratectomy; LASIK laser assisted in situ keratomileusis; HTN hypertension; DM diabetes mellitus; COPD chronic obstructive pulmonary disease

## 2020-05-25 NOTE — Assessment & Plan Note (Signed)

## 2020-06-01 ENCOUNTER — Other Ambulatory Visit (HOSPITAL_COMMUNITY): Payer: Self-pay | Admitting: Adult Health Nurse Practitioner

## 2020-06-02 ENCOUNTER — Ambulatory Visit (HOSPITAL_COMMUNITY): Payer: Medicare Other

## 2020-06-06 ENCOUNTER — Encounter (HOSPITAL_COMMUNITY): Payer: Self-pay

## 2020-06-06 NOTE — Progress Notes (Signed)
I have spoken with the patient and his wife this morning. The patient wishes to postpone his BMBx and follow-up at this point "due to Covid," patient states that he would like to "see where the virus is in a month." Scheduling aware of patient's wishes and will assist the patient in getting BMBx rescheduled with follow-up after.

## 2020-06-09 ENCOUNTER — Ambulatory Visit (HOSPITAL_COMMUNITY): Payer: Medicare Other

## 2020-06-22 ENCOUNTER — Ambulatory Visit (HOSPITAL_COMMUNITY): Payer: Medicare Other | Admitting: Hematology

## 2020-06-23 DIAGNOSIS — N529 Male erectile dysfunction, unspecified: Secondary | ICD-10-CM | POA: Diagnosis not present

## 2020-06-23 DIAGNOSIS — N401 Enlarged prostate with lower urinary tract symptoms: Secondary | ICD-10-CM | POA: Diagnosis not present

## 2020-06-23 DIAGNOSIS — R972 Elevated prostate specific antigen [PSA]: Secondary | ICD-10-CM | POA: Diagnosis not present

## 2020-06-25 DIAGNOSIS — D649 Anemia, unspecified: Secondary | ICD-10-CM | POA: Diagnosis not present

## 2020-06-25 DIAGNOSIS — K219 Gastro-esophageal reflux disease without esophagitis: Secondary | ICD-10-CM | POA: Diagnosis not present

## 2020-06-25 DIAGNOSIS — I10 Essential (primary) hypertension: Secondary | ICD-10-CM | POA: Diagnosis not present

## 2020-06-25 DIAGNOSIS — E7849 Other hyperlipidemia: Secondary | ICD-10-CM | POA: Diagnosis not present

## 2020-07-06 ENCOUNTER — Other Ambulatory Visit: Payer: Self-pay | Admitting: Cardiology

## 2020-07-18 DIAGNOSIS — K8051 Calculus of bile duct without cholangitis or cholecystitis with obstruction: Secondary | ICD-10-CM | POA: Diagnosis not present

## 2020-07-18 DIAGNOSIS — K219 Gastro-esophageal reflux disease without esophagitis: Secondary | ICD-10-CM | POA: Diagnosis not present

## 2020-07-18 DIAGNOSIS — L723 Sebaceous cyst: Secondary | ICD-10-CM | POA: Diagnosis not present

## 2020-07-18 DIAGNOSIS — K8031 Calculus of bile duct with cholangitis, unspecified, with obstruction: Secondary | ICD-10-CM | POA: Diagnosis not present

## 2020-07-18 DIAGNOSIS — E782 Mixed hyperlipidemia: Secondary | ICD-10-CM | POA: Diagnosis not present

## 2020-07-18 DIAGNOSIS — R109 Unspecified abdominal pain: Secondary | ICD-10-CM | POA: Diagnosis not present

## 2020-07-18 DIAGNOSIS — E7849 Other hyperlipidemia: Secondary | ICD-10-CM | POA: Diagnosis not present

## 2020-07-18 DIAGNOSIS — I480 Paroxysmal atrial fibrillation: Secondary | ICD-10-CM | POA: Diagnosis not present

## 2020-07-18 DIAGNOSIS — D692 Other nonthrombocytopenic purpura: Secondary | ICD-10-CM | POA: Diagnosis not present

## 2020-07-18 DIAGNOSIS — D649 Anemia, unspecified: Secondary | ICD-10-CM | POA: Diagnosis not present

## 2020-07-18 DIAGNOSIS — F5101 Primary insomnia: Secondary | ICD-10-CM | POA: Diagnosis not present

## 2020-07-18 DIAGNOSIS — R112 Nausea with vomiting, unspecified: Secondary | ICD-10-CM | POA: Diagnosis not present

## 2020-07-21 DIAGNOSIS — N400 Enlarged prostate without lower urinary tract symptoms: Secondary | ICD-10-CM | POA: Diagnosis not present

## 2020-07-21 DIAGNOSIS — D696 Thrombocytopenia, unspecified: Secondary | ICD-10-CM | POA: Diagnosis not present

## 2020-07-21 DIAGNOSIS — L723 Sebaceous cyst: Secondary | ICD-10-CM | POA: Diagnosis not present

## 2020-07-21 DIAGNOSIS — I2510 Atherosclerotic heart disease of native coronary artery without angina pectoris: Secondary | ICD-10-CM | POA: Diagnosis not present

## 2020-07-21 DIAGNOSIS — D649 Anemia, unspecified: Secondary | ICD-10-CM | POA: Diagnosis not present

## 2020-07-21 DIAGNOSIS — D692 Other nonthrombocytopenic purpura: Secondary | ICD-10-CM | POA: Diagnosis not present

## 2020-07-21 DIAGNOSIS — I429 Cardiomyopathy, unspecified: Secondary | ICD-10-CM | POA: Diagnosis not present

## 2020-07-21 DIAGNOSIS — K219 Gastro-esophageal reflux disease without esophagitis: Secondary | ICD-10-CM | POA: Diagnosis not present

## 2020-07-21 DIAGNOSIS — I480 Paroxysmal atrial fibrillation: Secondary | ICD-10-CM | POA: Diagnosis not present

## 2020-07-21 DIAGNOSIS — E782 Mixed hyperlipidemia: Secondary | ICD-10-CM | POA: Diagnosis not present

## 2020-07-21 DIAGNOSIS — K8031 Calculus of bile duct with cholangitis, unspecified, with obstruction: Secondary | ICD-10-CM | POA: Diagnosis not present

## 2020-07-21 DIAGNOSIS — R770 Abnormality of albumin: Secondary | ICD-10-CM | POA: Diagnosis not present

## 2020-07-25 DIAGNOSIS — K219 Gastro-esophageal reflux disease without esophagitis: Secondary | ICD-10-CM | POA: Diagnosis not present

## 2020-07-25 DIAGNOSIS — I10 Essential (primary) hypertension: Secondary | ICD-10-CM | POA: Diagnosis not present

## 2020-07-25 DIAGNOSIS — D649 Anemia, unspecified: Secondary | ICD-10-CM | POA: Diagnosis not present

## 2020-07-25 DIAGNOSIS — E7849 Other hyperlipidemia: Secondary | ICD-10-CM | POA: Diagnosis not present

## 2020-07-29 ENCOUNTER — Other Ambulatory Visit: Payer: Self-pay

## 2020-07-29 ENCOUNTER — Encounter: Payer: Self-pay | Admitting: Cardiology

## 2020-07-29 ENCOUNTER — Ambulatory Visit (INDEPENDENT_AMBULATORY_CARE_PROVIDER_SITE_OTHER): Payer: Medicare Other | Admitting: Cardiology

## 2020-07-29 VITALS — BP 142/80 | HR 60 | Ht 71.0 in | Wt 195.8 lb

## 2020-07-29 DIAGNOSIS — I25119 Atherosclerotic heart disease of native coronary artery with unspecified angina pectoris: Secondary | ICD-10-CM | POA: Diagnosis not present

## 2020-07-29 DIAGNOSIS — K831 Obstruction of bile duct: Secondary | ICD-10-CM | POA: Diagnosis not present

## 2020-07-29 DIAGNOSIS — I480 Paroxysmal atrial fibrillation: Secondary | ICD-10-CM | POA: Diagnosis not present

## 2020-07-29 DIAGNOSIS — K8309 Other cholangitis: Secondary | ICD-10-CM | POA: Diagnosis not present

## 2020-07-29 NOTE — Progress Notes (Signed)
Cardiology Office Note  Date: 07/29/2020   ID: Keahi, Mccarney [birth year], MRN [id number]  PCP:  Celene Squibb, MD  Cardiologist:  Rozann Lesches, MD Electrophysiologist:  None   Chief Complaint  Patient presents with  . Cardiac follow-up    History of Present Illness: ALDER MURRI is an 80 y.o. male last seen in August 2021.  He is here today with his wife for a follow-up visit.  He does not report any progressive sense of palpitations or angina symptoms.  He continues to have trouble with recurring cholangitis requiring placement of PBD at [facility], in fact has had some recent symptoms with follow-up planned.  I reviewed his recent lab work per Dr. Nevada Crane.  He remains thrombocytopenic, now following with Dr. Delton Coombes.  I have recommended that he stop his aspirin at this point.  He otherwise continues on Lopressor and heart rate is regular today.  No recent nitroglycerin use.  Past Medical History:  Diagnosis Date  . BPH (benign prostatic hyperplasia)   . CAD (coronary artery disease)    DES to LAD 06/2008  . Colon polyps   . COPD (chronic obstructive pulmonary disease) ([place])   . Enlarged prostate   . Gastritis   . 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 History of syncope January 2010   PVC's and14 beats NSVT as well as atrial afib  . Hyperlipidemia   . Melanoma ([place])   . PAF (paroxysmal atrial fibrillation) ([place])   . Sleep apnea 04/02/2005    Past Surgical History:  Procedure Laterality Date  . CARDIAC CATHETERIZATION  Feb 2010   post DES to LAD ,3.5 X 33 mm Cypher stent,dilated up to 3.8 mm 80 to90% stenosis btwn first and second diag branc w.moderate disease EF 50%  . CHOLECYSTECTOMY     Complicated by rupture of common bile duct and other vascular structures. Has now had multiple issues with chronic hepatic abscess. Currently has indwelling drain  . COLONOSCOPY  08/16/2011   Procedure: COLONOSCOPY;  Surgeon: Rogene Houston, MD;   [facility];  Service: Endoscopy;  Laterality: N/A;  1200  . ESOPHAGEAL DILATION N/A 01/20/2015   Procedure: ESOPHAGEAL DILATION;  Surgeon: Rogene Houston, MD;  [facility];  Service: Endoscopy;  Laterality: N/A;  . ESOPHAGEAL DILATION N/A 02/11/2020   Procedure: ESOPHAGEAL DILATION;  Surgeon: Rogene Houston, MD;  [facility];  Service: Endoscopy;  Laterality: N/A;  . ESOPHAGOGASTRODUODENOSCOPY N/A 01/20/2015   Procedure: ESOPHAGOGASTRODUODENOSCOPY (EGD);  Surgeon: Rogene Houston, MD;  [facility];  Service: Endoscopy;  Laterality: N/A;  1200  . ESOPHAGOGASTRODUODENOSCOPY N/A 02/11/2020   Procedure: ESOPHAGOGASTRODUODENOSCOPY (EGD);  Surgeon: Rogene Houston, MD;  [facility];  Service: Endoscopy;  Laterality: N/A;  730  . LEA DOPPLER  07/28/2008   NORMAL RGT GROIN DUPLEX DOPPLER  . liver stent    . LIVER SURGERY  09-03/14   @ [facility]  . Melanoma resection      Current Outpatient Medications  Medication Sig Dispense Refill  . amoxicillin-clavulanate (AUGMENTIN) 875-125 MG tablet Take 1 tablet by mouth 2 (two) times daily. For 30 days  - will start Augmentin in May    . cycloSPORINE (RESTASIS) 0.05 % ophthalmic emulsion Place 1 drop into both eyes 2 (two) times daily.     . finasteride (PROSCAR) 5 MG tablet Take 5 mg by mouth daily.    . fluorouracil (EFUDEX)  5 % cream Apply topically daily.    . metoprolol tartrate (LOPRESSOR) 25 MG tablet TAKE 1/2 TABLET(12.5 MG) BY MOUTH TWICE DAILY 90 tablet 3  . mirtazapine (REMERON) 7.5 MG tablet Take 7.5 mg by mouth at bedtime.    . Multiple Vitamin (MULITIVITAMIN WITH MINERALS) TABS Take 1 tablet by mouth daily. Centrum Silver men 50+    . Multiple Vitamins-Minerals (PRESERVISION AREDS) CAPS Take 1 capsule by mouth in the morning and at bedtime.     . naproxen sodium (ALEVE) 220 MG tablet Take 220 mg by mouth 2 (two) times daily as needed (pain).     . nitroGLYCERIN (NITROSTAT) 0.4 MG SL  tablet Place 0.4 mg under the tongue every 5 (five) minutes as needed for chest pain.    Marland Kitchen Oxymetazoline HCl (AFRIN NASAL SPRAY NA) Place 2 sprays into the nose 2 (two) times daily as needed.     . pantoprazole (PROTONIX) 40 MG tablet Take 40 mg by mouth at bedtime.     . pravastatin (PRAVACHOL) 20 MG tablet TAKE 1 TABLET(20 MG) BY MOUTH DAILY 90 tablet 3  . Probiotic Product (PROBIOTIC DAILY PO) Take 1 tablet by mouth daily.    . tamsulosin (FLOMAX) 0.4 MG CAPS capsule Take 0.4 mg by mouth in the morning and at bedtime.   3  . ursodiol (ACTIGALL) 250 MG tablet TAKE 1 TABLET(250 MG) BY MOUTH IN THE MORNING AND AT BEDTIME 90 tablet 3  . cephALEXin (KEFLEX) 500 MG capsule Take 500 mg by mouth 2 (two) times daily. (Patient not taking: Reported on 07/29/2020)    . ciprofloxacin (CIPRO) 500 MG tablet Take 1 tablet by mouth as directed. Patient will take 1 tablet twice a day for 1 month; current regimen is Bactrim - will start Cipro in April (Patient not taking: Reported on 07/29/2020)     No current facility-administered medications for this visit.   Allergies:  Contrast media [iodinated diagnostic agents], Metrizamide, and Other   ROS: No palpitations or syncope.  Physical Exam: VS:  BP (!) 142/80   Pulse 60   Ht 5\' 11"  (1.803 m)   Wt 195 lb 12.8 oz (88.8 kg)   SpO2 96%   BMI 27.31 kg/m , BMI Body mass index is 27.31 kg/m.  Wt Readings from Last 3 Encounters:  07/29/20 195 lb 12.8 oz (88.8 kg)  04/04/20 183 lb 3.2 oz (83.1 kg)  03/28/20 181 lb (82.1 kg)    General: Elderly male, appears comfortable at rest. HEENT: Conjunctiva and lids normal, wearing a mask. Neck: Supple, no elevated JVP or carotid bruits, no thyromegaly. Lungs: Clear to auscultation, nonlabored breathing at rest. Cardiac: Regular rate and rhythm, no S3, soft systolic murmur, no pericardial rub. Extremities: No pitting edema.  ECG:  An ECG dated 08/03/2019 was personally reviewed today and demonstrated:  Sinus rhythm with  low voltage, nonspecific T wave changes.  Recent Labwork: 03/29/2020: ALT 15; AST 19; BUN 13; Creatinine, Ser 0.92; Potassium 4.1; Sodium 138 04/14/2020: Hemoglobin 11.8; Platelets 61     Component Value Date/Time   CHOL 154 04/19/2015 0951  February 2022: Hgb 11.8, platelets 53, BUN 15, creatinine 1.07, potassium 4.5, AST 21, ALT 14, cholesterol 115, TG 74, HDL 40, LDL 60  Other Studies Reviewed Today:  Echocardiogram 02/22/2017: Study Conclusions  - Left ventricle: The cavity size was normal. Wall thickness was increased in a pattern of mild LVH. Systolic function was normal. The estimated ejection fraction was in the range of 55% to 60%.  Wall motion was normal; there were no regional wall motion abnormalities. Doppler parameters are consistent with abnormal left ventricular relaxation (grade 1 diastolic dysfunction). - Aortic valve: Valve area (VTI): 2.91 cm^2. Valve area (Vmax): 2.25 cm^2. - Left atrium: The atrium was mildly to moderately dilated. - Technically adequate study.  Assessment and Plan:  1.  Paroxysmal atrial fibrillation with CHA2DS2-VASc score of 4.  Per discussion we have not pursued anticoagulation.  He has unpredictable bouts of cholangitis requiring intervention at [facility], also now persistently thrombocytopenic.  Heart rate regular today and he does not describe any interval palpitations.  Continue Lopressor and observation.  2.  CAD status post DES to the LAD in 2010.  Would stop aspirin at this point in light of thrombocytopenia.  Continue Lopressor, Pravachol, and as needed nitroglycerin.  Medication Adjustments/Labs and Tests Ordered: Current medicines are reviewed at length with the patient today.  Concerns regarding medicines are outlined above.   Tests Ordered: No orders of the defined types were placed in this encounter.   Medication Changes: No orders of the defined types were placed in this encounter.   Disposition:  Follow up 6  months in the [place] office.  Signed, Satira Sark, MD, [facility] 07/29/2020 11:16 AM    [facility] [place]. [address] [place], [place] [address] Phone: [phone]; Fax: [phone]

## 2020-07-29 NOTE — Patient Instructions (Signed)
Medication Instructions:  STOP Aspirin  *If you need a refill on your cardiac medications before your next appointment, please call your pharmacy*   Lab Work: None today If you have labs (blood work) drawn today and your tests are completely normal, you will receive your results only by: Marland Kitchen MyChart Message (if you have MyChart) OR . A paper copy in the mail If you have any lab test that is abnormal or we need to change your treatment, we will call you to review the results.   Testing/Procedures: None today   Follow-Up: At [facility], you and your health needs are our priority.  As part of our continuing mission to provide you with exceptional heart care, we have created designated Provider Care Teams.  These Care Teams include your primary Cardiologist (physician) and Advanced Practice Providers (APPs -  Physician Assistants and Nurse Practitioners) who all work together to provide you with the care you need, when you need it.  We recommend signing up for the patient portal called "MyChart".  Sign up information is provided on this After Visit Summary.  MyChart is used to connect with patients for Virtual Visits (Telemedicine).  Patients are able to view lab/test results, encounter notes, upcoming appointments, etc.  Non-urgent messages can be sent to your provider as well.   To learn more about what you can do with MyChart, go to [email or web address].    Your next appointment:   6 month(s)  The format for your next appointment:   In Person  Provider:   Rozann Lesches, MD   Other Instructions None      Thank you for choosing [facility] !

## 2020-08-02 DIAGNOSIS — K831 Obstruction of bile duct: Secondary | ICD-10-CM | POA: Diagnosis not present

## 2020-08-02 DIAGNOSIS — K8309 Other cholangitis: Secondary | ICD-10-CM | POA: Diagnosis not present

## 2020-08-02 DIAGNOSIS — Z9049 Acquired absence of other specified parts of digestive tract: Secondary | ICD-10-CM | POA: Diagnosis not present

## 2020-08-02 DIAGNOSIS — R509 Fever, unspecified: Secondary | ICD-10-CM | POA: Diagnosis not present

## 2020-08-22 ENCOUNTER — Other Ambulatory Visit: Payer: Self-pay | Admitting: Radiology

## 2020-08-23 ENCOUNTER — Other Ambulatory Visit: Payer: Self-pay

## 2020-08-23 ENCOUNTER — Encounter (HOSPITAL_COMMUNITY): Payer: Self-pay

## 2020-08-23 ENCOUNTER — Ambulatory Visit (HOSPITAL_COMMUNITY)
Admission: RE | Admit: 2020-08-23 | Discharge: 2020-08-23 | Disposition: A | Discharge status: Home / Self Care | Payer: Medicare Other | Source: Ambulatory Visit | Attending: Hematology | Admitting: Hematology

## 2020-08-23 DIAGNOSIS — Z9049 Acquired absence of other specified parts of digestive tract: Secondary | ICD-10-CM | POA: Insufficient documentation

## 2020-08-23 DIAGNOSIS — Z85820 Personal history of malignant melanoma of skin: Secondary | ICD-10-CM | POA: Insufficient documentation

## 2020-08-23 DIAGNOSIS — D61818 Other pancytopenia: Secondary | ICD-10-CM | POA: Insufficient documentation

## 2020-08-23 DIAGNOSIS — Z86010 Personal history of colonic polyps: Secondary | ICD-10-CM | POA: Diagnosis not present

## 2020-08-23 DIAGNOSIS — Z955 Presence of coronary angioplasty implant and graft: Secondary | ICD-10-CM | POA: Diagnosis not present

## 2020-08-23 DIAGNOSIS — D472 Monoclonal gammopathy: Secondary | ICD-10-CM | POA: Insufficient documentation

## 2020-08-23 DIAGNOSIS — Z79899 Other long term (current) drug therapy: Secondary | ICD-10-CM | POA: Diagnosis not present

## 2020-08-23 DIAGNOSIS — D696 Thrombocytopenia, unspecified: Secondary | ICD-10-CM | POA: Insufficient documentation

## 2020-08-23 DIAGNOSIS — Z792 Long term (current) use of antibiotics: Secondary | ICD-10-CM | POA: Insufficient documentation

## 2020-08-23 LAB — PROTIME-INR
INR: 1.1 (ref 0.8–1.2)
Prothrombin Time: 14.2 seconds (ref 11.4–15.2)

## 2020-08-23 LAB — CBC WITH DIFFERENTIAL/PLATELET
Abs Immature Granulocytes: 0.01 10*3/uL (ref 0.00–0.07)
Basophils Absolute: 0 10*3/uL (ref 0.0–0.1)
Basophils Relative: 1 %
Eosinophils Absolute: 0.1 10*3/uL (ref 0.0–0.5)
Eosinophils Relative: 2 %
HCT: 38.7 % — ABNORMAL LOW (ref 39.0–52.0)
Hemoglobin: 12.3 g/dL — ABNORMAL LOW (ref 13.0–17.0)
Immature Granulocytes: 0 %
Lymphocytes Relative: 19 %
Lymphs Abs: 0.8 10*3/uL (ref 0.7–4.0)
MCH: 27.3 pg (ref 26.0–34.0)
MCHC: 31.8 g/dL (ref 30.0–36.0)
MCV: 85.8 fL (ref 80.0–100.0)
Monocytes Absolute: 0.3 10*3/uL (ref 0.1–1.0)
Monocytes Relative: 8 %
Neutro Abs: 2.7 10*3/uL (ref 1.7–7.7)
Neutrophils Relative %: 70 %
Platelets: 61 10*3/uL — ABNORMAL LOW (ref 150–400)
RBC: 4.51 MIL/uL (ref 4.22–5.81)
RDW: 14.9 % (ref 11.5–15.5)
WBC: 3.9 10*3/uL — ABNORMAL LOW (ref 4.0–10.5)
nRBC: 0 % (ref 0.0–0.2)

## 2020-08-23 LAB — BASIC METABOLIC PANEL
Anion gap: 6 (ref 5–15)
BUN: 15 mg/dL (ref 8–23)
CO2: 25 mmol/L (ref 22–32)
Calcium: 8.4 mg/dL — ABNORMAL LOW (ref 8.9–10.3)
Chloride: 109 mmol/L (ref 98–111)
Creatinine, Ser: 0.91 mg/dL (ref 0.61–1.24)
GFR, Estimated: >60 mL/min (ref >60)
Glucose, Bld: 97 mg/dL (ref 70–99)
Potassium: 3.7 mmol/L (ref 3.5–5.1)
Sodium: 140 mmol/L (ref 135–145)

## 2020-08-23 MED ORDER — LIDOCAINE HCL (PF) 1 % IJ SOLN
INTRAMUSCULAR | Status: AC | PRN
  Administered 2020-08-23: 10 mL

## 2020-08-23 MED ORDER — MIDAZOLAM HCL 2 MG/2ML IJ SOLN
INTRAMUSCULAR | Status: AC | PRN
  Administered 2020-08-23 (×2): 1 mg via INTRAVENOUS

## 2020-08-23 MED ORDER — FENTANYL CITRATE (PF) 100 MCG/2ML IJ SOLN
INTRAMUSCULAR | Status: AC
  Filled 2020-08-23: qty 2

## 2020-08-23 MED ORDER — SODIUM CHLORIDE 0.9 % IV SOLN: INTRAVENOUS | Status: DC

## 2020-08-23 MED ORDER — MIDAZOLAM HCL 2 MG/2ML IJ SOLN
INTRAMUSCULAR | Status: AC
  Filled 2020-08-23: qty 4

## 2020-08-23 MED ORDER — FENTANYL CITRATE (PF) 100 MCG/2ML IJ SOLN
INTRAMUSCULAR | Status: AC | PRN
  Administered 2020-08-23 (×2): 50 ug via INTRAVENOUS

## 2020-08-23 NOTE — H&P (Signed)
Referring Physician(s): Katragadda,Sreedhar  Supervising Physician: Markus Daft  Patient Status:  [facility]  Chief Complaint: "I'm having a bone marrow biopsy"   Subjective: Patient familiar to IR service from hepatic abscess drain in 2012 and random liver biopsy in 2013.  He has a history of MGUS and persistent thrombocytopenia despite steroid therapy.  He presents today for CT-guided bone marrow biopsy for further evaluation.  Additional medical history as below.  He currently denies fever, headache, chest pain, dyspnea, cough, abdominal/flank pain, nausea, vomiting or bleeding.  Past Medical History:  Diagnosis Date  . BPH (benign prostatic hyperplasia)   . CAD (coronary artery disease)    DES to LAD 06/2008  . Colon polyps   . COPD (chronic obstructive pulmonary disease) ([place])   . Enlarged prostate   . Gastritis   . 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 History of syncope January 2010   PVC's and14 beats NSVT as well as atrial afib  . Hyperlipidemia   . Melanoma ([place])   . PAF (paroxysmal atrial fibrillation) ([place])   . Sleep apnea 04/02/2005   Past Surgical History:  Procedure Laterality Date  . CARDIAC CATHETERIZATION  Feb 2010   post DES to LAD ,3.5 X 33 mm Cypher stent,dilated up to 3.8 mm 80 to90% stenosis btwn first and second diag branc w.moderate disease EF 50%  . CHOLECYSTECTOMY     Complicated by rupture of common bile duct and other vascular structures. Has now had multiple issues with chronic hepatic abscess. Currently has indwelling drain  . COLONOSCOPY  08/16/2011   Procedure: COLONOSCOPY;  Surgeon: Rogene Houston, MD;  [facility]: [facility];  Service: Endoscopy;  Laterality: N/A;  1200  . ESOPHAGEAL DILATION N/A 01/20/2015   Procedure: ESOPHAGEAL DILATION;  Surgeon: Rogene Houston, MD;  [facility]: [facility];  Service: Endoscopy;  Laterality: N/A;  . ESOPHAGEAL DILATION N/A 02/11/2020   Procedure: ESOPHAGEAL DILATION;   Surgeon: Rogene Houston, MD;  [facility];  Service: Endoscopy;  Laterality: N/A;  . ESOPHAGOGASTRODUODENOSCOPY N/A 01/20/2015   Procedure: ESOPHAGOGASTRODUODENOSCOPY (EGD);  Surgeon: Rogene Houston, MD;  [facility];  Service: Endoscopy;  Laterality: N/A;  1200  . ESOPHAGOGASTRODUODENOSCOPY N/A 02/11/2020   Procedure: ESOPHAGOGASTRODUODENOSCOPY (EGD);  Surgeon: Rogene Houston, MD;  [facility];  Service: Endoscopy;  Laterality: N/A;  730  . LEA DOPPLER  07/28/2008   NORMAL RGT GROIN DUPLEX DOPPLER  . liver stent    . LIVER SURGERY  09-03/14   @ [facility]  . Melanoma resection        Allergies: Contrast media [iodinated diagnostic agents], Metrizamide, and Other  Medications: Prior to Admission medications   Medication Sig Start Date End Date Taking? Authorizing Provider  amoxicillin-clavulanate (AUGMENTIN) 875-125 MG tablet Take 1 tablet by mouth 2 (two) times daily. For 30 days  - will start Augmentin in May 07/14/19  Yes [provider]  ciprofloxacin (CIPRO) 500 MG tablet Take 1 tablet by mouth as directed. Patient will take 1 tablet twice a day for 1 month; current regimen is Bactrim - will start Cipro in April 07/14/19  Yes [provider]  cycloSPORINE (RESTASIS) 0.05 % ophthalmic emulsion Place 1 drop into both eyes 2 (two) times daily.    Yes [provider]  finasteride (PROSCAR) 5 MG tablet Take 5 mg by mouth daily.   Yes [provider]  metoprolol tartrate (LOPRESSOR) 25 MG tablet TAKE  1/2 TABLET(12.5 MG) BY MOUTH TWICE DAILY 07/06/20  Yes Satira Sark, MD  mirtazapine (REMERON) 7.5 MG tablet Take 7.5 mg by mouth at bedtime. 01/04/20  Yes [provider]  Multiple Vitamin (MULITIVITAMIN WITH MINERALS) TABS Take 1 tablet by mouth daily. Centrum Silver men 50+   Yes [provider]  Multiple Vitamins-Minerals (PRESERVISION AREDS) CAPS Take 1 capsule by mouth in the morning and at bedtime.     Yes [provider]  naproxen sodium (ALEVE) 220 MG tablet Take 220 mg by mouth 2 (two) times daily as needed (pain).    Yes [provider]  Oxymetazoline HCl (AFRIN NASAL SPRAY NA) Place 2 sprays into the nose 2 (two) times daily as needed.    Yes [provider]  pantoprazole (PROTONIX) 40 MG tablet Take 40 mg by mouth at bedtime.  12/09/19  Yes [provider]  pravastatin (PRAVACHOL) 20 MG tablet TAKE 1 TABLET(20 MG) BY MOUTH DAILY 07/06/20  Yes Satira Sark, MD  Probiotic Product (PROBIOTIC DAILY PO) Take 1 tablet by mouth daily.   Yes [provider]  tamsulosin (FLOMAX) 0.4 MG CAPS capsule Take 0.4 mg by mouth in the morning and at bedtime.  06/24/15  Yes [provider]  ursodiol (ACTIGALL) 250 MG tablet TAKE 1 TABLET(250 MG) BY MOUTH IN THE MORNING AND AT BEDTIME 05/24/20  Yes Rehman, Mechele Dawley, MD  cephALEXin (KEFLEX) 500 MG capsule Take 500 mg by mouth 2 (two) times daily. Patient not taking: No sig reported    [provider]  fluorouracil (EFUDEX) 5 % cream Apply topically daily. 03/28/20   [provider]  nitroGLYCERIN (NITROSTAT) 0.4 MG SL tablet Place 0.4 mg under the tongue every 5 (five) minutes as needed for chest pain.    [provider]     Vital Signs: BP (!) 147/92   Pulse 67   Temp 97.6 F (36.4 C) (Oral)   Resp 18   Ht '5\' 10"'  (1.778 m)   Wt 180 lb (81.6 kg)   SpO2 97%   BMI 25.83 kg/m   Physical Exam awake, alert.  Chest with distant breath sounds bilaterally.  Heart with regular rate and rhythm.  Abdomen soft, positive bowel sounds, intact biliary drain present which is currently capped.  Trace pretibial edema bilaterally, more so on the left. Imaging: No results found.  Labs:  CBC: Recent Labs    03/15/20 1523 04/14/20 1120  WBC 4.5 5.4  HGB 12.0* 11.8*  HCT 37.9* 38.0*  PLT 64* 61*    COAGS: Recent Labs    08/23/20 0735  INR 1.1    BMP: Recent Labs     03/29/20 1115 08/23/20 0735  NA 138 140  K 4.1 3.7  CL 104 109  CO2 27 25  GLUCOSE 87 97  BUN 13 15  CALCIUM 8.7* 8.4*  CREATININE 0.92 0.91  GFRNONAA >60 >60    LIVER FUNCTION TESTS: Recent Labs    03/29/20 1115  BILITOT 1.0  AST 19  ALT 15  ALKPHOS 107  PROT 6.4*  ALBUMIN 3.5    Assessment and Plan: Patient familiar to IR service from hepatic abscess drain in 2012 and random liver biopsy in 2013.  He has a history of MGUS and persistent thrombocytopenia despite steroid therapy.  He presents today for CT-guided bone marrow biopsy for further evaluation. Risks and benefits of procedure was discussed with the patient including, but not limited to bleeding, infection, damage to adjacent structures  or low yield requiring additional tests.  All of the questions were answered and there is agreement to proceed.  Consent signed and in chart.     Electronically Signed: D. Rowe Robert, PA-C 08/23/2020, 8:18 AM   I spent a total of 20 minutes at the the patient's bedside AND on the patient's hospital floor or unit, greater than 50% of which was counseling/coordinating care for CT-guided bone marrow biopsy

## 2020-08-23 NOTE — Discharge Instructions (Signed)
Interventional radiology phone numbers [phone] After hours [phone]   Bone Marrow Aspiration and Bone Marrow Biopsy, Adult, Care After This sheet gives you information about how to care for yourself after your procedure. Your health care provider may also give you more specific instructions. If you have problems or questions, contact your health care provider. What can I expect after the procedure? After the procedure, it is common to have:  Mild pain and tenderness.  Swelling.  Bruising. Follow these instructions at home: Puncture site care  Follow instructions from your health care provider about how to take care of the puncture site. Make sure you: ? Wash your hands with soap and water before and after you change your bandage (dressing). If soap and water are not available, use hand sanitizer.  Check your puncture site every day for signs of infection. Check for: ? More redness, swelling, or pain. ? Fluid or blood. ? Warmth. ? Pus or a bad smell.   Activity  Return to your normal activities as told by your health care provider. Ask your health care provider what activities are safe for you.  Do not lift anything that is heavier than 10 lb (4.5 kg), or the limit that you are told, until your health care provider says that it is safe.  Do not drive for 24 hours if you were given a sedative during your procedure. General instructions  Take over-the-counter and prescription medicines only as told by your health care provider.  Do not take baths, swim, or use a hot tub until your health care provider approves. You may take your dressing off tomorrow morning and shower.  If directed, put ice on the affected area. To do this: ? Put ice in a plastic bag. ? Place a towel between your skin and the bag. ? Leave the ice on for 20 minutes, 2-3 times a day.  Keep all follow-up visits as told by your health care provider. This is important.   Contact a health care provider  if:  Your pain is not controlled with medicine.  You have a fever.  You have more redness, swelling, or pain around the puncture site.  You have fluid or blood coming from the puncture site.  Your puncture site feels warm to the touch.  You have pus or a bad smell coming from the puncture site. Summary  After the procedure, it is common to have mild pain, tenderness, swelling, and bruising.  Follow instructions from your health care provider about how to take care of the puncture site and what activities are safe for you.  Take over-the-counter and prescription medicines only as told by your health care provider.  Contact a health care provider if you have any signs of infection, such as fluid or blood coming from the puncture site. This information is not intended to replace advice given to you by your health care provider. Make sure you discuss any questions you have with your health care provider. Document Revised: 09/30/2018 Document Reviewed: 09/30/2018 Elsevier Patient Education  2021 [place].   Moderate Conscious Sedation, Adult, Care After This sheet gives you information about how to care for yourself after your procedure. Your health care provider may also give you more specific instructions. If you have problems or questions, contact your health care provider. What can I expect after the procedure? After the procedure, it is common to have:  Sleepiness for several hours.  Impaired judgment for several hours.  Difficulty with balance.  Vomiting if  you eat too soon. Follow these instructions at home: For the time period you were told by your health care provider:  Rest.  Do not participate in activities where you could fall or become injured.  Do not drive or use machinery.  Do not drink alcohol.  Do not take sleeping pills or medicines that cause drowsiness.  Do not make important decisions or sign legal documents.  Do not take care of children on  your own.      Eating and drinking  Follow the diet recommended by your health care provider.  Drink enough fluid to keep your urine pale yellow.  If you vomit: ? Drink water, juice, or soup when you can drink without vomiting. ? Make sure you have little or no nausea before eating solid foods.   General instructions  Take over-the-counter and prescription medicines only as told by your health care provider.  Have a responsible adult stay with you for the time you are told. It is important to have someone help care for you until you are awake and alert.  Do not smoke.  Keep all follow-up visits as told by your health care provider. This is important. Contact a health care provider if:  You are still sleepy or having trouble with balance after 24 hours.  You feel light-headed.  You keep feeling nauseous or you keep vomiting.  You develop a rash.  You have a fever.  You have redness or swelling around the IV site. Get help right away if:  You have trouble breathing.  You have new-onset confusion at home. Summary  After the procedure, it is common to feel sleepy, have impaired judgment, or feel nauseous if you eat too soon.  Rest after you get home. Know the things you should not do after the procedure.  Follow the diet recommended by your health care provider and drink enough fluid to keep your urine pale yellow.  Get help right away if you have trouble breathing or new-onset confusion at home. This information is not intended to replace advice given to you by your health care provider. Make sure you discuss any questions you have with your health care provider. Document Revised: 09/11/2019 Document Reviewed: 04/09/2019 Elsevier Patient Education  2021 [employer].

## 2020-08-23 NOTE — Procedures (Signed)
Interventional Radiology Procedure:   Indications: MGUS  Procedure: CT guided bone marrow biopsy  Findings: 2 aspirates and 1 core from right ilium  Complications: None     EBL: Minimal, less than 10 ml  Plan: Discharge to home in one hour.   Naimah Yingst R. Ruchy Wildrick, MD  Pager: [phone]   

## 2020-08-24 DIAGNOSIS — K4091 Unilateral inguinal hernia, without obstruction or gangrene, recurrent: Secondary | ICD-10-CM | POA: Diagnosis not present

## 2020-08-24 DIAGNOSIS — D649 Anemia, unspecified: Secondary | ICD-10-CM | POA: Diagnosis not present

## 2020-08-24 DIAGNOSIS — D696 Thrombocytopenia, unspecified: Secondary | ICD-10-CM | POA: Diagnosis not present

## 2020-08-24 DIAGNOSIS — R770 Abnormality of albumin: Secondary | ICD-10-CM | POA: Diagnosis not present

## 2020-08-24 DIAGNOSIS — D692 Other nonthrombocytopenic purpura: Secondary | ICD-10-CM | POA: Diagnosis not present

## 2020-08-24 DIAGNOSIS — I429 Cardiomyopathy, unspecified: Secondary | ICD-10-CM | POA: Diagnosis not present

## 2020-08-24 DIAGNOSIS — I480 Paroxysmal atrial fibrillation: Secondary | ICD-10-CM | POA: Diagnosis not present

## 2020-08-24 DIAGNOSIS — K219 Gastro-esophageal reflux disease without esophagitis: Secondary | ICD-10-CM | POA: Diagnosis not present

## 2020-08-24 DIAGNOSIS — N400 Enlarged prostate without lower urinary tract symptoms: Secondary | ICD-10-CM | POA: Diagnosis not present

## 2020-08-24 DIAGNOSIS — L723 Sebaceous cyst: Secondary | ICD-10-CM | POA: Diagnosis not present

## 2020-08-24 DIAGNOSIS — E782 Mixed hyperlipidemia: Secondary | ICD-10-CM | POA: Diagnosis not present

## 2020-08-24 DIAGNOSIS — K8031 Calculus of bile duct with cholangitis, unspecified, with obstruction: Secondary | ICD-10-CM | POA: Diagnosis not present

## 2020-08-25 LAB — SURGICAL PATHOLOGY

## 2020-08-30 ENCOUNTER — Encounter (HOSPITAL_COMMUNITY): Payer: Self-pay | Admitting: Hematology

## 2020-09-02 ENCOUNTER — Encounter (HOSPITAL_COMMUNITY): Payer: Self-pay | Admitting: Hematology

## 2020-09-06 ENCOUNTER — Inpatient Hospital Stay (HOSPITAL_COMMUNITY): Payer: Medicare Other | Attending: Hematology | Admitting: Hematology

## 2020-09-06 ENCOUNTER — Other Ambulatory Visit: Payer: Self-pay

## 2020-09-06 VITALS — BP 134/71 | HR 68 | Temp 98.2°F | Resp 18 | Wt 191.0 lb

## 2020-09-06 DIAGNOSIS — D472 Monoclonal gammopathy: Secondary | ICD-10-CM | POA: Diagnosis not present

## 2020-09-06 DIAGNOSIS — Z87891 Personal history of nicotine dependence: Secondary | ICD-10-CM | POA: Insufficient documentation

## 2020-09-06 DIAGNOSIS — K222 Esophageal obstruction: Secondary | ICD-10-CM | POA: Insufficient documentation

## 2020-09-06 DIAGNOSIS — K4090 Unilateral inguinal hernia, without obstruction or gangrene, not specified as recurrent: Secondary | ICD-10-CM | POA: Insufficient documentation

## 2020-09-06 DIAGNOSIS — D696 Thrombocytopenia, unspecified: Secondary | ICD-10-CM | POA: Insufficient documentation

## 2020-09-06 DIAGNOSIS — Z85820 Personal history of malignant melanoma of skin: Secondary | ICD-10-CM | POA: Diagnosis not present

## 2020-09-06 DIAGNOSIS — J449 Chronic obstructive pulmonary disease, unspecified: Secondary | ICD-10-CM | POA: Insufficient documentation

## 2020-09-06 DIAGNOSIS — E785 Hyperlipidemia, unspecified: Secondary | ICD-10-CM | POA: Diagnosis not present

## 2020-09-06 DIAGNOSIS — Z7982 Long term (current) use of aspirin: Secondary | ICD-10-CM | POA: Insufficient documentation

## 2020-09-06 DIAGNOSIS — G4730 Sleep apnea, unspecified: Secondary | ICD-10-CM | POA: Insufficient documentation

## 2020-09-06 DIAGNOSIS — I4891 Unspecified atrial fibrillation: Secondary | ICD-10-CM | POA: Diagnosis not present

## 2020-09-06 DIAGNOSIS — I480 Paroxysmal atrial fibrillation: Secondary | ICD-10-CM | POA: Insufficient documentation

## 2020-09-06 DIAGNOSIS — I700 Atherosclerosis of aorta: Secondary | ICD-10-CM | POA: Insufficient documentation

## 2020-09-06 DIAGNOSIS — Z86010 Personal history of colonic polyps: Secondary | ICD-10-CM | POA: Insufficient documentation

## 2020-09-06 DIAGNOSIS — Z79899 Other long term (current) drug therapy: Secondary | ICD-10-CM | POA: Diagnosis not present

## 2020-09-06 DIAGNOSIS — Z800 Family history of malignant neoplasm of digestive organs: Secondary | ICD-10-CM | POA: Diagnosis not present

## 2020-09-06 DIAGNOSIS — M129 Arthropathy, unspecified: Secondary | ICD-10-CM | POA: Diagnosis not present

## 2020-09-06 DIAGNOSIS — N400 Enlarged prostate without lower urinary tract symptoms: Secondary | ICD-10-CM | POA: Diagnosis not present

## 2020-09-06 DIAGNOSIS — I2510 Atherosclerotic heart disease of native coronary artery without angina pectoris: Secondary | ICD-10-CM | POA: Insufficient documentation

## 2020-09-06 NOTE — Progress Notes (Signed)
[place], [place] [address]   CLINIC:  Medical Oncology/Hematology  PCP:  Celene Squibb, MD [address] [place] [address]  [phone]  REASON FOR VISIT:  Follow-up for thrombocytopenia and MGUS  PRIOR THERAPY: None  CURRENT THERAPY: Surveillance  INTERVAL HISTORY:  [name], a 80 y.o. male, returns for routine follow-up for his thrombocytopenia and MGUS. Alexander Burton was last contacted via telephone on 04/20/2020.  Today he is accompanied by his wife and he reports feeling okay. His inguinal hernia was not repaired at [facility] due to concerns about his thrombocytopenia; Dr. Colonel Bald is the surgeon. His inguinal hernia continues hurting occasionally.    REVIEW OF SYSTEMS:  Review of Systems  Constitutional: Positive for appetite change (50%) and fatigue (50%).  Gastrointestinal: Positive for abdominal pain (occasional over hernia).       Inguinal hernia  All other systems reviewed and are negative.   PAST MEDICAL/SURGICAL HISTORY:  Past Medical History:  Diagnosis Date  . BPH (benign prostatic hyperplasia)   . CAD (coronary artery disease)    DES to LAD 06/2008  . Colon polyps   . COPD (chronic obstructive pulmonary disease) ([place])   . Enlarged prostate   . Gastritis   . 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 History of syncope January 2010   PVC's and14 beats NSVT as well as atrial afib  . Hyperlipidemia   . Melanoma ([place])   . PAF (paroxysmal atrial fibrillation) ([place])   . Sleep apnea 04/02/2005   Past Surgical History:  Procedure Laterality Date  . CARDIAC CATHETERIZATION  Feb 2010   post DES to LAD ,3.5 X 33 mm Cypher stent,dilated up to 3.8 mm 80 to90% stenosis btwn first and second diag branc w.moderate disease EF 50%  . CHOLECYSTECTOMY     Complicated by rupture of common bile duct and other vascular structures. Has now had multiple issues with chronic hepatic abscess.  Currently has indwelling drain  . COLONOSCOPY  08/16/2011   Procedure: COLONOSCOPY;  Surgeon: Rogene Houston, MD;  [facility];  Service: Endoscopy;  Laterality: N/A;  1200  . ESOPHAGEAL DILATION N/A 01/20/2015   Procedure: ESOPHAGEAL DILATION;  Surgeon: Rogene Houston, MD;  [facility];  Service: Endoscopy;  Laterality: N/A;  . ESOPHAGEAL DILATION N/A 02/11/2020   Procedure: ESOPHAGEAL DILATION;  Surgeon: Rogene Houston, MD;  [facility];  Service: Endoscopy;  Laterality: N/A;  . ESOPHAGOGASTRODUODENOSCOPY N/A 01/20/2015   Procedure: ESOPHAGOGASTRODUODENOSCOPY (EGD);  Surgeon: Rogene Houston, MD;  [facility];  Service: Endoscopy;  Laterality: N/A;  1200  . ESOPHAGOGASTRODUODENOSCOPY N/A 02/11/2020   Procedure: ESOPHAGOGASTRODUODENOSCOPY (EGD);  Surgeon: Rogene Houston, MD;  [facility];  Service: Endoscopy;  Laterality: N/A;  730  . LEA DOPPLER  07/28/2008   NORMAL RGT GROIN DUPLEX DOPPLER  . liver stent    . LIVER SURGERY  09-03/14   @ [facility]  . Melanoma resection      SOCIAL HISTORY:  Social History   Socioeconomic History  . Marital status: Married    Spouse name: Not on file  . Number of children: 2  . Years of education: Not on file  . Highest education level: Not on file  Occupational History    Employer: RETIRED  Tobacco Use  . Smoking status: Former Smoker    Packs/day: 1.50    Years:  50.00    Pack years: 75.00    Types: Cigarettes    Quit date: 05/29/2007    Years since quitting: 13.2  . Smokeless tobacco: Never Used  Vaping Use  . Vaping Use: Never used  Substance and Sexual Activity  . Alcohol use: Not Currently    Alcohol/week: 1.0 standard drink    Types: 1 Standard drinks or equivalent per week    Comment: very rare  . Drug use: No  . Sexual activity: Not Currently  Other Topics Concern  . Not on file  Social History Narrative   Married father to come a grandfather 48. 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 Has more fatigue with walking but still able to walk up to 2 miles prior to his most recent hospitalization.   Social Determinants of Health   Financial Resource Strain: Low Risk   . Difficulty of Paying Living Expenses: Not hard at all  Food Insecurity: No Food Insecurity  . Worried About Charity fundraiser in the Last Year: Never true  . Ran Out of Food in the Last Year: Never true  Transportation Needs: No Transportation Needs  . Lack of Transportation (Medical): No  . Lack of Transportation (Non-Medical): No  Physical Activity: Inactive  . Days of Exercise per Week: 0 days  . Minutes of Exercise per Session: 0 min  Stress: No Stress Concern Present  . Feeling of Stress : Only a little  Social Connections: Moderately Integrated  . Frequency of Communication with Friends and Family: More than three times a week  . Frequency of Social Gatherings with Friends and Family: More than three times a week  . Attends Religious Services: More than 4 times per year  . Active Member of Clubs or Organizations: No  . Attends Archivist Meetings: Never  . Marital Status: Married  Human resources officer Violence: Not At Risk  . Fear of Current or Ex-Partner: No  . Emotionally Abused: No  . Physically Abused: No  . Sexually Abused: No    FAMILY HISTORY:  Family History  Problem Relation Age of Onset  . Colon cancer Sister   . Heart disease Mother   . Emphysema Father     CURRENT MEDICATIONS:  Current Outpatient Medications  Medication Sig Dispense Refill  . amoxicillin-clavulanate (AUGMENTIN) 875-125 MG tablet Take 1 tablet by mouth 2 (two) times daily. For 30 days  - will start Augmentin in May    . cephALEXin (KEFLEX) 500 MG capsule Take 500 mg by mouth 2 (two) times daily.    . ciprofloxacin (CIPRO) 500 MG tablet Take 1 tablet by mouth as directed. Patient will take 1 tablet twice a day for 1 month; current regimen is Bactrim - will start Cipro in April     . cycloSPORINE (RESTASIS) 0.05 % ophthalmic emulsion Place 1 drop into both eyes 2 (two) times daily.     . finasteride (PROSCAR) 5 MG tablet Take 5 mg by mouth daily.    . fluorouracil (EFUDEX) 5 % cream Apply topically daily.    . metoprolol tartrate (LOPRESSOR) 25 MG tablet TAKE 1/2 TABLET(12.5 MG) BY MOUTH TWICE DAILY 90 tablet 3  . mirtazapine (REMERON) 7.5 MG tablet Take 7.5 mg by mouth at bedtime.    . Multiple Vitamin (MULITIVITAMIN WITH MINERALS) TABS Take 1 tablet by mouth daily. Centrum Silver men 50+    . Multiple Vitamins-Minerals (PRESERVISION AREDS) CAPS Take 1 capsule by mouth in the morning and at  bedtime.     . naproxen sodium (ALEVE) 220 MG tablet Take 220 mg by mouth 2 (two) times daily as needed (pain).     . nitroGLYCERIN (NITROSTAT) 0.4 MG SL tablet Place 0.4 mg under the tongue every 5 (five) minutes as needed for chest pain.    Marland Kitchen Oxymetazoline HCl (AFRIN NASAL SPRAY NA) Place 2 sprays into the nose 2 (two) times daily as needed.     . pantoprazole (PROTONIX) 40 MG tablet Take 40 mg by mouth at bedtime.     . pravastatin (PRAVACHOL) 20 MG tablet TAKE 1 TABLET(20 MG) BY MOUTH DAILY 90 tablet 3  . Probiotic Product (PROBIOTIC DAILY PO) Take 1 tablet by mouth daily.    . Sodium Chloride Flush (NORMAL SALINE FLUSH) 0.9 % SOLN USE 10 MLS BY INTRACATHETER ROUTE AS DIRECTED 1800 mL 3  . tamsulosin (FLOMAX) 0.4 MG CAPS capsule Take 0.4 mg by mouth in the morning and at bedtime.   3  . traMADol (ULTRAM) 50 MG tablet Take 1 tablet by mouth every 6 (six) hours as needed.    . ursodiol (ACTIGALL) 250 MG tablet TAKE 1 TABLET(250 MG) BY MOUTH IN THE MORNING AND AT BEDTIME 90 tablet 3   No current facility-administered medications for this visit.    ALLERGIES:  Allergies  Allergen Reactions  . Contrast Media [Iodinated Diagnostic Agents]     Hives, needs pre meds  . Metrizamide Hives    Hives, needs pre meds  . Other Itching and Other (See Comments)    Contrast dye erythema     PHYSICAL EXAM:  Performance status (ECOG): 1 - Symptomatic but completely ambulatory  Vitals:   09/06/20 1619  BP: 134/71  Pulse: 68  Resp: 18  Temp: 98.2 F (36.8 C)  SpO2: 99%   Wt Readings from Last 3 Encounters:  09/06/20 191 lb (86.6 kg)  08/23/20 180 lb (81.6 kg)  07/29/20 195 lb 12.8 oz (88.8 kg)   Physical Exam Vitals reviewed.  Constitutional:      Appearance: Normal appearance.  Cardiovascular:     Rate and Rhythm: Normal rate and regular rhythm.     Pulses: Normal pulses.     Heart sounds: Normal heart sounds.  Pulmonary:     Effort: Pulmonary effort is normal.     Breath sounds: Normal breath sounds.  Abdominal:     Palpations: Abdomen is soft. There is no mass.     Tenderness: There is no abdominal tenderness.     Hernia: No hernia is present.  Musculoskeletal:     Right lower leg: No edema.     Left lower leg: No edema.  Neurological:     General: No focal deficit present.     Mental Status: He is alert and oriented to person, place, and time.  Psychiatric:        Mood and Affect: Mood normal.        Behavior: Behavior normal.     LABORATORY DATA:  I have reviewed the labs as listed.  CBC Latest Ref Rng & Units 08/23/2020 04/14/2020 03/15/2020  WBC 4.0 - 10.5 K/uL 3.9(L) 5.4 4.5  Hemoglobin 13.0 - 17.0 g/dL 12.3(L) 11.8(L) 12.0(L)  Hematocrit 39.0 - 52.0 % 38.7(L) 38.0(L) 37.9(L)  Platelets 150 - 400 K/uL 61(L) 61(L) 64(L)   CMP Latest Ref Rng & Units 08/23/2020 03/29/2020 08/03/2019  Glucose 70 - 99 mg/dL 97 87 159(H)  BUN 8 - 23 mg/dL $Remove'15 13 18  'cPRlHBm$ Creatinine 0.61 - 1.24  mg/dL 0.91 0.92 1.29(H)  Sodium 135 - 145 mmol/L 140 138 137  Potassium 3.5 - 5.1 mmol/L 3.7 4.1 4.4  Chloride 98 - 111 mmol/L 109 104 102  CO2 22 - 32 mmol/L $RemoveB'25 27 25  'yCnITRXQ$ Calcium 8.9 - 10.3 mg/dL 8.4(L) 8.7(L) 8.4(L)  Total Protein 6.5 - 8.1 g/dL - 6.4(L) 6.7  Total Bilirubin 0.3 - 1.2 mg/dL - 1.0 2.6(H)  Alkaline Phos 38 - 126 U/L - 107 212(H)  AST 15 - 41 U/L - 19 60(H)   ALT 0 - 44 U/L - 15 57(H)      Component Value Date/Time   RBC 4.51 08/23/2020 0735   MCV 85.8 08/23/2020 0735   MCV 83.4 06/23/2014 1122   MCH 27.3 08/23/2020 0735   MCHC 31.8 08/23/2020 0735   RDW 14.9 08/23/2020 0735   RDW 16.0 (H) 06/23/2014 1122   LYMPHSABS 0.8 08/23/2020 0735   LYMPHSABS 1.0 06/23/2014 1122   MONOABS 0.3 08/23/2020 0735   MONOABS 0.5 06/23/2014 1122   EOSABS 0.1 08/23/2020 0735   EOSABS 0.1 06/23/2014 1122   BASOSABS 0.0 08/23/2020 0735   BASOSABS 0.0 06/23/2014 1122    DIAGNOSTIC IMAGING:  I have independently reviewed the scans and discussed with the patient. CT Biopsy  Result Date: 08/23/2020 INDICATION: Monoclonal gammopathy of unknown significance. EXAM: CT GUIDED BONE MARROW ASPIRATES AND BIOPSY Physician: Stephan Minister. Anselm Pancoast, MD MEDICATIONS: None. ANESTHESIA/SEDATION: Fentanyl 100 mcg IV; Versed 2.0 mg IV Moderate Sedation Time:  10 minutes The patient was continuously monitored during the procedure by the interventional radiology nurse under my direct supervision. COMPLICATIONS: None immediate. PROCEDURE: The procedure was explained to the patient. The risks and benefits of the procedure were discussed and the patient's questions were addressed. Informed consent was obtained from the patient. The patient was placed prone on CT table. Images of the pelvis were obtained. The right side of back was prepped and draped in sterile fashion. The skin and right posterior ilium were anesthetized with 1% lidocaine. 11 gauge bone needle was directed into the right ilium with CT guidance. Two aspirates and one core biopsy were obtained. Bandage placed over the puncture site. FINDINGS: Patient has multiple sclerotic lesions in the bones and this is similar to a CT from 07/29/2013. Sclerotic lesions could represent multiple bone islands. Atherosclerotic calcifications involving the abdominal aorta and iliac arteries. Bone needle was directed in the posterior right ilium.  IMPRESSION: CT guided bone marrow aspiration and core biopsy. Electronically Signed   By: Markus Daft M.D.   On: 08/23/2020 12:31   CT BONE MARROW BIOPSY & ASPIRATION  Result Date: 08/23/2020 INDICATION: Monoclonal gammopathy of unknown significance. EXAM: CT GUIDED BONE MARROW ASPIRATES AND BIOPSY Physician: Stephan Minister. Anselm Pancoast, MD MEDICATIONS: None. ANESTHESIA/SEDATION: Fentanyl 100 mcg IV; Versed 2.0 mg IV Moderate Sedation Time:  10 minutes The patient was continuously monitored during the procedure by the interventional radiology nurse under my direct supervision. COMPLICATIONS: None immediate. PROCEDURE: The procedure was explained to the patient. The risks and benefits of the procedure were discussed and the patient's questions were addressed. Informed consent was obtained from the patient. The patient was placed prone on CT table. Images of the pelvis were obtained. The right side of back was prepped and draped in sterile fashion. The skin and right posterior ilium were anesthetized with 1% lidocaine. 11 gauge bone needle was directed into the right ilium with CT guidance. Two aspirates and one core biopsy were obtained. Bandage placed over the puncture site. FINDINGS:  Patient has multiple sclerotic lesions in the bones and this is similar to a CT from 07/29/2013. Sclerotic lesions could represent multiple bone islands. Atherosclerotic calcifications involving the abdominal aorta and iliac arteries. Bone needle was directed in the posterior right ilium. IMPRESSION: CT guided bone marrow aspiration and core biopsy. Electronically Signed   By: Markus Daft M.D.   On: 08/23/2020 12:31     ASSESSMENT:  1. Thrombocytopenia: -Patient seen at the request of Dr. [place] Crane for thrombocytopenia. Recent labs at his office showed platelet count 61, unable to quantitate due to aggregation. -Review of labs show patient has mild to moderate thrombocytopenia for the last 10 years. -Easy bruising on extremities. Patient on  aspirin 81 mg daily. -No prior history of transfusions. No hematuria/melena/bleeding per rectum. -He has history of recurrent biliary infections and has been on continuous antibiotics for the last 1 year (Cipro alternating with Augmentin alternating with Keflex). -He reportedly needs surgery for his right inguinal hernia and evaluated by a surgeon at [facility]. -CT AP on 08/06/2019 at [facility] shows splenomegaly measuring 14 cm. Right hepatectomy. Percutaneous transhepatic left biliary drain traverses the biliary stent with pigtail along the right hepatic resection margin. Ill-defined linear hypodensities along the subcapsular left hepatic lobe, possibly dilated bile ducts are chronically thrombosed peripheral portal vein branches. -EGD on 02/11/2020 by Dr.Rehmanshowing benign esophageal stenosis at GE junction, portal hypertensive gastropathy, normal duodenum. -Nutritional deficiency work-up on 03/15/2020 was negative. SPEP showed 0.3 g of M spike. Connective tissue disorder work-up and hepatitis B and C was negative. -Bone marrow biopsy on 08/23/2020 with hypercellular bone marrow for age with trilineage hematopoiesis, 1% plasma cells.  Chromosome analysis was normal.  Myeloma FISH panel was also normal.  No dysplasia in all 3 cell lines noted.  2. Paroxysmal atrial fibrillation: -He is on aspirin 81 mg daily. He is not on DOAC.  3. He requires PBD exchange every few months done by Dr. [employer]. He is also on continuous antibiotics prescribed by ID at [facility].  4.  IgG lambda MGUS: -Diagnosed on 03/15/2020, 0.3 g/dL.  Kappa light chains 32.3, lambda light chains 37.9, ratio 0.85.  Beta-2 microglobulin 3.2.  LDH 126. -Skeletal survey on 03/28/2020 was negative for lytic disease.   PLAN:  1.Moderate thrombocytopenia: -This is most likely from splenomegaly measuring 14 cm in CT scan from 2021. -We reviewed bone marrow biopsy results from 08/23/2020 which showed no  evidence of dysplasia with orderly and progressive maturation.  Megakaryocytes were abundant with normal morphology.  1% plasma cells lack large aggregates.  Chromosome analysis and FISH panel was normal. -CBC on 08/23/2020 shows platelet count stable at 61K. -Patient wants to have surgery for inguinal hernia with mesh repair.  He will have it done by Dr. Colonel Bald at [facility].  He plans to do it sometime in the fall. -We will plan to see him back in 4 months with repeat CBC.  2. Monoclonal gammopathy: -He does not have any "crab" features to warrant further work-up at this time. -We will plan to repeat myeloma panel in 4 months, prior to his next visit.  Orders placed this encounter:  Orders Placed This Encounter  Procedures  . Kappa/lambda light chains  . Protein electrophoresis, serum  . Lactate dehydrogenase  . Ferritin  . Iron and TIBC  . Comprehensive metabolic panel  . CBC with Differential/Platelet     Derek Jack, MD [place] [phone]   I, Milinda Antis, am acting as a Education administrator  for Dr. Sanda Linger.  I, Derek Jack MD, have reviewed the above documentation for accuracy and completeness, and I agree with the above.

## 2020-09-06 NOTE — Patient Instructions (Signed)
[place] [facility] Discharge Instructions  You were seen today by Dr. Delton Coombes. He went over your recent results. You may proceed with your hernia repair since your platelet count has been steadily above 60. Dr. Delton Coombes will see you back in 4 months for labs and follow up.   Thank you for choosing [place] at [facility] to provide your oncology and hematology care.  To afford each patient quality time with our provider, please arrive at least 15 minutes before your scheduled appointment time.   If you have a lab appointment with the [facility] please come in thru the Main Entrance and check in at the main information desk  You need to re-schedule your appointment should you arrive 10 or more minutes late.  We strive to give you quality time with our providers, and arriving late affects you and other patients whose appointments are after yours.  Also, if you no show three or more times for appointments you may be dismissed from the clinic at the providers discretion.     Again, thank you for choosing [facility].  Our hope is that these requests will decrease the amount of time that you wait before being seen by our physicians.       _____________________________________________________________  Should you have questions after your visit to [facility], please contact our office at [phone] between the hours of 8:00 a.m. and 4:30 p.m.  Voicemails left after 4:00 p.m. will not be returned until the following business day.  For prescription refill requests, have your pharmacy contact our office and allow 72 hours.    Cancer Center Support Programs:   > Cancer Support Group  2nd Tuesday of the month 1pm-2pm, Journey Room

## 2020-09-26 ENCOUNTER — Other Ambulatory Visit (HOSPITAL_COMMUNITY): Payer: Self-pay

## 2020-10-03 ENCOUNTER — Other Ambulatory Visit (HOSPITAL_COMMUNITY): Payer: Self-pay

## 2020-10-03 MED FILL — Sodium Chloride Flush IV Soln 0.9%: INTRAVENOUS | 60 days supply | Qty: 1800 | Fill #0 | Status: AC

## 2020-10-17 ENCOUNTER — Other Ambulatory Visit (HOSPITAL_COMMUNITY): Payer: Self-pay

## 2020-10-25 DIAGNOSIS — Z85828 Personal history of other malignant neoplasm of skin: Secondary | ICD-10-CM | POA: Diagnosis not present

## 2020-10-25 DIAGNOSIS — L910 Hypertrophic scar: Secondary | ICD-10-CM | POA: Diagnosis not present

## 2020-10-25 DIAGNOSIS — L821 Other seborrheic keratosis: Secondary | ICD-10-CM | POA: Diagnosis not present

## 2020-10-25 DIAGNOSIS — L570 Actinic keratosis: Secondary | ICD-10-CM | POA: Diagnosis not present

## 2020-10-25 DIAGNOSIS — L578 Other skin changes due to chronic exposure to nonionizing radiation: Secondary | ICD-10-CM | POA: Diagnosis not present

## 2020-10-28 ENCOUNTER — Other Ambulatory Visit (HOSPITAL_COMMUNITY): Payer: Self-pay

## 2020-10-28 MED FILL — Sodium Chloride Flush IV Soln 0.9%: INTRAVENOUS | 60 days supply | Qty: 1800 | Fill #1 | Status: AC

## 2020-11-12 ENCOUNTER — Other Ambulatory Visit (INDEPENDENT_AMBULATORY_CARE_PROVIDER_SITE_OTHER): Payer: Self-pay | Admitting: Internal Medicine

## 2020-11-14 NOTE — Telephone Encounter (Signed)
Will address with Dr.Rehman. 

## 2020-11-24 DIAGNOSIS — I10 Essential (primary) hypertension: Secondary | ICD-10-CM | POA: Diagnosis not present

## 2020-11-24 DIAGNOSIS — E1165 Type 2 diabetes mellitus with hyperglycemia: Secondary | ICD-10-CM | POA: Diagnosis not present

## 2020-12-06 DIAGNOSIS — K8309 Other cholangitis: Secondary | ICD-10-CM | POA: Diagnosis not present

## 2020-12-06 DIAGNOSIS — K831 Obstruction of bile duct: Secondary | ICD-10-CM | POA: Diagnosis not present

## 2020-12-17 DIAGNOSIS — K8309 Other cholangitis: Secondary | ICD-10-CM | POA: Diagnosis not present

## 2020-12-17 DIAGNOSIS — K831 Obstruction of bile duct: Secondary | ICD-10-CM | POA: Diagnosis not present

## 2020-12-17 DIAGNOSIS — Z20822 Contact with and (suspected) exposure to covid-19: Secondary | ICD-10-CM | POA: Diagnosis not present

## 2020-12-20 DIAGNOSIS — I4891 Unspecified atrial fibrillation: Secondary | ICD-10-CM | POA: Diagnosis not present

## 2020-12-20 DIAGNOSIS — N400 Enlarged prostate without lower urinary tract symptoms: Secondary | ICD-10-CM | POA: Diagnosis not present

## 2020-12-20 DIAGNOSIS — Z4682 Encounter for fitting and adjustment of non-vascular catheter: Secondary | ICD-10-CM | POA: Diagnosis not present

## 2020-12-20 DIAGNOSIS — Z955 Presence of coronary angioplasty implant and graft: Secondary | ICD-10-CM | POA: Diagnosis not present

## 2020-12-20 DIAGNOSIS — I2510 Atherosclerotic heart disease of native coronary artery without angina pectoris: Secondary | ICD-10-CM | POA: Diagnosis not present

## 2020-12-20 DIAGNOSIS — G4733 Obstructive sleep apnea (adult) (pediatric): Secondary | ICD-10-CM | POA: Diagnosis not present

## 2020-12-20 DIAGNOSIS — K831 Obstruction of bile duct: Secondary | ICD-10-CM | POA: Diagnosis not present

## 2020-12-20 DIAGNOSIS — E785 Hyperlipidemia, unspecified: Secondary | ICD-10-CM | POA: Diagnosis not present

## 2020-12-20 DIAGNOSIS — I739 Peripheral vascular disease, unspecified: Secondary | ICD-10-CM | POA: Diagnosis not present

## 2020-12-20 DIAGNOSIS — Z7982 Long term (current) use of aspirin: Secondary | ICD-10-CM | POA: Diagnosis not present

## 2020-12-20 DIAGNOSIS — K8309 Other cholangitis: Secondary | ICD-10-CM | POA: Diagnosis not present

## 2020-12-20 DIAGNOSIS — Z79899 Other long term (current) drug therapy: Secondary | ICD-10-CM | POA: Diagnosis not present

## 2020-12-20 DIAGNOSIS — I10 Essential (primary) hypertension: Secondary | ICD-10-CM | POA: Diagnosis not present

## 2020-12-21 DIAGNOSIS — I10 Essential (primary) hypertension: Secondary | ICD-10-CM | POA: Diagnosis not present

## 2020-12-21 DIAGNOSIS — K831 Obstruction of bile duct: Secondary | ICD-10-CM | POA: Diagnosis not present

## 2020-12-21 DIAGNOSIS — I2510 Atherosclerotic heart disease of native coronary artery without angina pectoris: Secondary | ICD-10-CM | POA: Diagnosis not present

## 2020-12-21 DIAGNOSIS — K8309 Other cholangitis: Secondary | ICD-10-CM | POA: Diagnosis not present

## 2020-12-21 DIAGNOSIS — Z7982 Long term (current) use of aspirin: Secondary | ICD-10-CM | POA: Diagnosis not present

## 2020-12-21 DIAGNOSIS — Z79899 Other long term (current) drug therapy: Secondary | ICD-10-CM | POA: Diagnosis not present

## 2021-01-03 ENCOUNTER — Inpatient Hospital Stay (HOSPITAL_COMMUNITY): Payer: Medicare Other

## 2021-01-11 ENCOUNTER — Ambulatory Visit (HOSPITAL_COMMUNITY): Payer: Medicare Other | Admitting: Hematology

## 2021-01-12 ENCOUNTER — Ambulatory Visit (HOSPITAL_COMMUNITY): Payer: Medicare Other | Admitting: Hematology

## 2021-01-12 DIAGNOSIS — R972 Elevated prostate specific antigen [PSA]: Secondary | ICD-10-CM | POA: Diagnosis not present

## 2021-01-12 DIAGNOSIS — N401 Enlarged prostate with lower urinary tract symptoms: Secondary | ICD-10-CM | POA: Diagnosis not present

## 2021-01-12 DIAGNOSIS — N529 Male erectile dysfunction, unspecified: Secondary | ICD-10-CM | POA: Diagnosis not present

## 2021-01-18 DIAGNOSIS — I10 Essential (primary) hypertension: Secondary | ICD-10-CM | POA: Diagnosis not present

## 2021-01-23 DIAGNOSIS — I429 Cardiomyopathy, unspecified: Secondary | ICD-10-CM | POA: Diagnosis not present

## 2021-01-23 DIAGNOSIS — N401 Enlarged prostate with lower urinary tract symptoms: Secondary | ICD-10-CM | POA: Diagnosis not present

## 2021-01-23 DIAGNOSIS — Z0001 Encounter for general adult medical examination with abnormal findings: Secondary | ICD-10-CM | POA: Diagnosis not present

## 2021-01-23 DIAGNOSIS — D649 Anemia, unspecified: Secondary | ICD-10-CM | POA: Diagnosis not present

## 2021-01-23 DIAGNOSIS — D696 Thrombocytopenia, unspecified: Secondary | ICD-10-CM | POA: Diagnosis not present

## 2021-01-23 DIAGNOSIS — K8309 Other cholangitis: Secondary | ICD-10-CM | POA: Diagnosis not present

## 2021-01-23 DIAGNOSIS — I2510 Atherosclerotic heart disease of native coronary artery without angina pectoris: Secondary | ICD-10-CM | POA: Diagnosis not present

## 2021-01-23 DIAGNOSIS — E782 Mixed hyperlipidemia: Secondary | ICD-10-CM | POA: Diagnosis not present

## 2021-01-23 DIAGNOSIS — I480 Paroxysmal atrial fibrillation: Secondary | ICD-10-CM | POA: Diagnosis not present

## 2021-01-23 DIAGNOSIS — K4090 Unilateral inguinal hernia, without obstruction or gangrene, not specified as recurrent: Secondary | ICD-10-CM | POA: Diagnosis not present

## 2021-01-23 DIAGNOSIS — K219 Gastro-esophageal reflux disease without esophagitis: Secondary | ICD-10-CM | POA: Diagnosis not present

## 2021-01-23 DIAGNOSIS — L723 Sebaceous cyst: Secondary | ICD-10-CM | POA: Diagnosis not present

## 2021-02-08 ENCOUNTER — Other Ambulatory Visit (HOSPITAL_COMMUNITY): Payer: Self-pay

## 2021-02-08 MED FILL — Sodium Chloride Flush IV Soln 0.9%: INTRAVENOUS | 13 days supply | Qty: 400 | Fill #0 | Status: CN

## 2021-02-08 MED FILL — Sodium Chloride Flush IV Soln 0.9%: INTRAVENOUS | 40 days supply | Qty: 400 | Fill #0 | Status: CN

## 2021-02-09 ENCOUNTER — Other Ambulatory Visit (HOSPITAL_COMMUNITY): Payer: Self-pay

## 2021-02-10 ENCOUNTER — Other Ambulatory Visit (HOSPITAL_COMMUNITY): Payer: Self-pay

## 2021-02-13 ENCOUNTER — Other Ambulatory Visit (HOSPITAL_COMMUNITY): Payer: Self-pay

## 2021-02-13 MED FILL — Sodium Chloride Flush IV Soln 0.9%: INTRAVENOUS | 43 days supply | Qty: 1300 | Fill #0 | Status: AC

## 2021-02-13 NOTE — Progress Notes (Signed)
Cardiology Office Note  Date: 02/14/2021   ID: Alexander, Burton Feb 28, 1941, MRN [id number]  PCP:  Celene Squibb, MD  Cardiologist:  Rozann Lesches, MD Electrophysiologist:  None   Chief Complaint  Patient presents with   Cardiac follow-up    History of Present Illness: Alexander Burton is an 80 y.o. male last seen in March.  He is here with his wife for a follow-up visit.  He does not describe any change in cardiac status, no angina symptoms or palpitations.  He had recent lab work with Dr. Nevada Crane as noted below.  LDL was 70 on Pravachol.  We did take him off aspirin at the last visit due to thrombocytopenia, his most recent platelet count was 62.  He continues to follow regularly at [facility] with history of recurrent cholangitis requiring placement of PBD.  I personally reviewed his ECG today which shows normal sinus rhythm.  Past Medical History:  Diagnosis Date   BPH (benign prostatic hyperplasia)    CAD (coronary artery disease)    DES to LAD 06/2008   Colon polyps    COPD (chronic obstructive pulmonary disease) (HCC)    Enlarged prostate    Gastritis    Hepatic abscess, chronic[572.0]    Resulting from distant surgery   History of syncope January 2010   PVC's and14 beats NSVT as well as atrial afib   Hyperlipidemia    Melanoma (Alexander Burton)    PAF (paroxysmal atrial fibrillation) (Alexander Burton)    Sleep apnea 04/02/2005    Past Surgical History:  Procedure Laterality Date   CARDIAC CATHETERIZATION  Feb 2010   post DES to LAD ,3.5 X 33 mm Cypher stent,dilated up to 3.8 mm 80 to90% stenosis btwn first and second diag branc w.moderate disease EF 50%   CHOLECYSTECTOMY     Complicated by rupture of common bile duct and other vascular structures. Has now had multiple issues with chronic hepatic abscess. Currently has indwelling drain   COLONOSCOPY  08/16/2011   Procedure: COLONOSCOPY;  Surgeon: Rogene Houston, MD;  [facility];  Service: Endoscopy;  Laterality: N/A;  1200    ESOPHAGEAL DILATION N/A 01/20/2015   Procedure: ESOPHAGEAL DILATION;  Surgeon: Rogene Houston, MD;  [facility];  Service: Endoscopy;  Laterality: N/A;   ESOPHAGEAL DILATION N/A 02/11/2020   Procedure: ESOPHAGEAL DILATION;  Surgeon: Rogene Houston, MD;  [facility];  Service: Endoscopy;  Laterality: N/A;   ESOPHAGOGASTRODUODENOSCOPY N/A 01/20/2015   Procedure: ESOPHAGOGASTRODUODENOSCOPY (EGD);  Surgeon: Rogene Houston, MD;  [facility];  Service: Endoscopy;  Laterality: N/A;  1200   ESOPHAGOGASTRODUODENOSCOPY N/A 02/11/2020   Procedure: ESOPHAGOGASTRODUODENOSCOPY (EGD);  Surgeon: Rogene Houston, MD;  [facility];  Service: Endoscopy;  Laterality: N/A;  730   LEA DOPPLER  07/28/2008   NORMAL RGT GROIN DUPLEX DOPPLER   liver stent     LIVER SURGERY  09-03/14   @ [facility]   Melanoma resection      Current Outpatient Medications  Medication Sig Dispense Refill   amoxicillin-clavulanate (AUGMENTIN) 875-125 MG tablet Take 1 tablet by mouth 2 (two) times daily. For 30 days  - will start Augmentin in May     cycloSPORINE (RESTASIS) 0.05 % ophthalmic emulsion Place 1 drop into both eyes 2 (two) times daily.      finasteride (PROSCAR) 5 MG tablet Take 5 mg by mouth daily.     fluorouracil (EFUDEX) 5 % cream Apply topically  daily.     metoprolol tartrate (LOPRESSOR) 25 MG tablet TAKE 1/2 TABLET(12.5 MG) BY MOUTH TWICE DAILY 90 tablet 3   mirtazapine (REMERON) 7.5 MG tablet Take 7.5 mg by mouth at bedtime.     Multiple Vitamin (MULITIVITAMIN WITH MINERALS) TABS Take 1 tablet by mouth daily. Centrum Silver men 50+     Multiple Vitamins-Minerals (PRESERVISION AREDS) CAPS Take 1 capsule by mouth in the morning and at bedtime.      naproxen sodium (ALEVE) 220 MG tablet Take 220 mg by mouth 2 (two) times daily as needed (pain).      nitroGLYCERIN (NITROSTAT) 0.4 MG SL tablet Place 0.4 mg under the tongue every 5 (five) minutes as needed for chest pain.      Oxymetazoline HCl (AFRIN NASAL SPRAY NA) Place 2 sprays into the nose 2 (two) times daily as needed.      pantoprazole (PROTONIX) 40 MG tablet Take 40 mg by mouth at bedtime.      pravastatin (PRAVACHOL) 20 MG tablet TAKE 1 TABLET(20 MG) BY MOUTH DAILY 90 tablet 3   Probiotic Product (PROBIOTIC DAILY PO) Take 1 tablet by mouth daily.     Sodium Chloride Flush (NORMAL SALINE FLUSH) 0.9 % SOLN USE 10 MLS BY INTRACATHETER ROUTE AS DIRECTED 1800 mL 3   tamsulosin (FLOMAX) 0.4 MG CAPS capsule Take 0.4 mg by mouth in the morning and at bedtime.   3   traMADol (ULTRAM) 50 MG tablet Take 1 tablet by mouth every 6 (six) hours as needed.     ursodiol (ACTIGALL) 250 MG tablet TAKE 1 TABLET(250 MG) BY MOUTH IN THE MORNING AND AT BEDTIME 90 tablet 3   cephALEXin (KEFLEX) 500 MG capsule Take 500 mg by mouth 2 (two) times daily. (Patient not taking: Reported on 02/14/2021)     ciprofloxacin (CIPRO) 500 MG tablet Take 1 tablet by mouth as directed. Patient will take 1 tablet twice a day for 1 month; current regimen is Bactrim - will start Cipro in April (Patient not taking: Reported on 02/14/2021)     No current facility-administered medications for this visit.   Allergies:  Contrast media [iodinated diagnostic agents], Metrizamide, and Other   ROS: No syncope.  Physical Exam: VS:  BP 128/78   Pulse 66   Ht '5\' 11"'$  (1.803 m)   Wt 188 lb (85.3 kg)   SpO2 96%   BMI 26.22 kg/m , BMI Body mass index is 26.22 kg/m.  Wt Readings from Last 3 Encounters:  02/14/21 188 lb (85.3 kg)  09/06/20 191 lb (86.6 kg)  08/23/20 180 lb (81.6 kg)    General: Elderly male, appears comfortable at rest. HEENT: Conjunctiva and lids normal, oropharynx clear with moist mucosa. Neck: Supple, no elevated JVP or carotid bruits, no thyromegaly. Lungs: Clear to auscultation, nonlabored breathing at rest. Cardiac: Regular rate and rhythm, no S3, 1/6 systolic murmur. Extremities: No pitting edema.  ECG:  An ECG dated 08/03/2019 was  personally reviewed today and demonstrated:  Sinus rhythm with low voltage, nonspecific T wave changes.  Recent Labwork: 03/29/2020: ALT 15; AST 19 08/23/2020: BUN 15; Creatinine, Ser 0.91; Hemoglobin 12.3; Platelets 61; Potassium 3.7; Sodium 140  July 2022: Potassium 4.2, BUN 15, creatinine 1.1, AST 23, ALT 14, hemoglobin 10.8, platelets 59, magnesium 2.0 Hemoglobin 12.4, platelets 62, BUN 13, creatinine 1.22, potassium 4.2, AST 19, ALT 10, cholesterol 127, triglycerides 89, HDL 40, LDL 70  Other Studies Reviewed Today:  Echocardiogram 02/22/2017: Study Conclusions   - Left ventricle:  The cavity size was normal. Wall thickness was   increased in a pattern of mild LVH. Systolic function was normal.   The estimated ejection fraction was in the range of 55% to 60%.   Wall motion was normal; there were no regional wall motion   abnormalities. Doppler parameters are consistent with abnormal   left ventricular relaxation (grade 1 diastolic dysfunction). - Aortic valve: Valve area (VTI): 2.91 cm^2. Valve area (Vmax):   2.25 cm^2. - Left atrium: The atrium was mildly to moderately dilated. - Technically adequate study.  Assessment and Plan:  1.  Paroxysmal atrial fibrillation with CHA2DS2-VASc score of 4.  He remains in sinus rhythm and reports no interval palpitations.  As per previous discussion we have not pursued anticoagulation.  I reviewed his ECG today.  Continue observation on Lopressor.  2.  CAD status post DES to the LAD in 2010.  No longer on aspirin with chronic thrombocytopenia.  Continue Lopressor and Pravachol.  His recent LDL was 70.  Medication Adjustments/Labs and Tests Ordered: Current medicines are reviewed at length with the patient today.  Concerns regarding medicines are outlined above.   Tests Ordered: Orders Placed This Encounter  Procedures   EKG 12-Lead    Medication Changes: No orders of the defined types were placed in this encounter.   Disposition:   Follow up  6 months.  Signed, Satira Sark, MD, [facility] 02/14/2021 11:29 AM    [place] [facility] [place]. [address] [place], [place] [address] Phone: [phone]; Fax: [phone]

## 2021-02-14 ENCOUNTER — Ambulatory Visit (INDEPENDENT_AMBULATORY_CARE_PROVIDER_SITE_OTHER): Payer: Medicare Other | Admitting: Cardiology

## 2021-02-14 ENCOUNTER — Other Ambulatory Visit: Payer: Self-pay

## 2021-02-14 ENCOUNTER — Encounter: Payer: Self-pay | Admitting: Cardiology

## 2021-02-14 VITALS — BP 128/78 | HR 66 | Ht 71.0 in | Wt 188.0 lb

## 2021-02-14 DIAGNOSIS — I25119 Atherosclerotic heart disease of native coronary artery with unspecified angina pectoris: Secondary | ICD-10-CM

## 2021-02-14 DIAGNOSIS — I480 Paroxysmal atrial fibrillation: Secondary | ICD-10-CM

## 2021-02-14 NOTE — Patient Instructions (Signed)

## 2021-02-17 DIAGNOSIS — Z23 Encounter for immunization: Secondary | ICD-10-CM | POA: Diagnosis not present

## 2021-02-27 ENCOUNTER — Other Ambulatory Visit (HOSPITAL_COMMUNITY): Payer: Self-pay

## 2021-02-27 MED ORDER — NORMAL SALINE FLUSH 0.9 % IV SOLN
10.0000 mL | Freq: Three times a day (TID) | INTRAVENOUS | 6 refills | Status: DC | PRN
  Filled 2021-02-27: qty 900, 30d supply, fill #0
  Filled 2021-04-19: qty 900, 30d supply, fill #1

## 2021-02-28 ENCOUNTER — Other Ambulatory Visit (HOSPITAL_COMMUNITY): Payer: Self-pay

## 2021-02-28 IMAGING — DX DG ABDOMEN ACUTE W/ 1V CHEST
4 series · 4 of 4 positions shown · non-contrast
Comparison: 03/23/2014

CLINICAL DATA: Weakness decreased appetite and fever reported
history of stents in the liver.

EXAM:
DG ABDOMEN ACUTE W/ 1V CHEST

[chest pa]
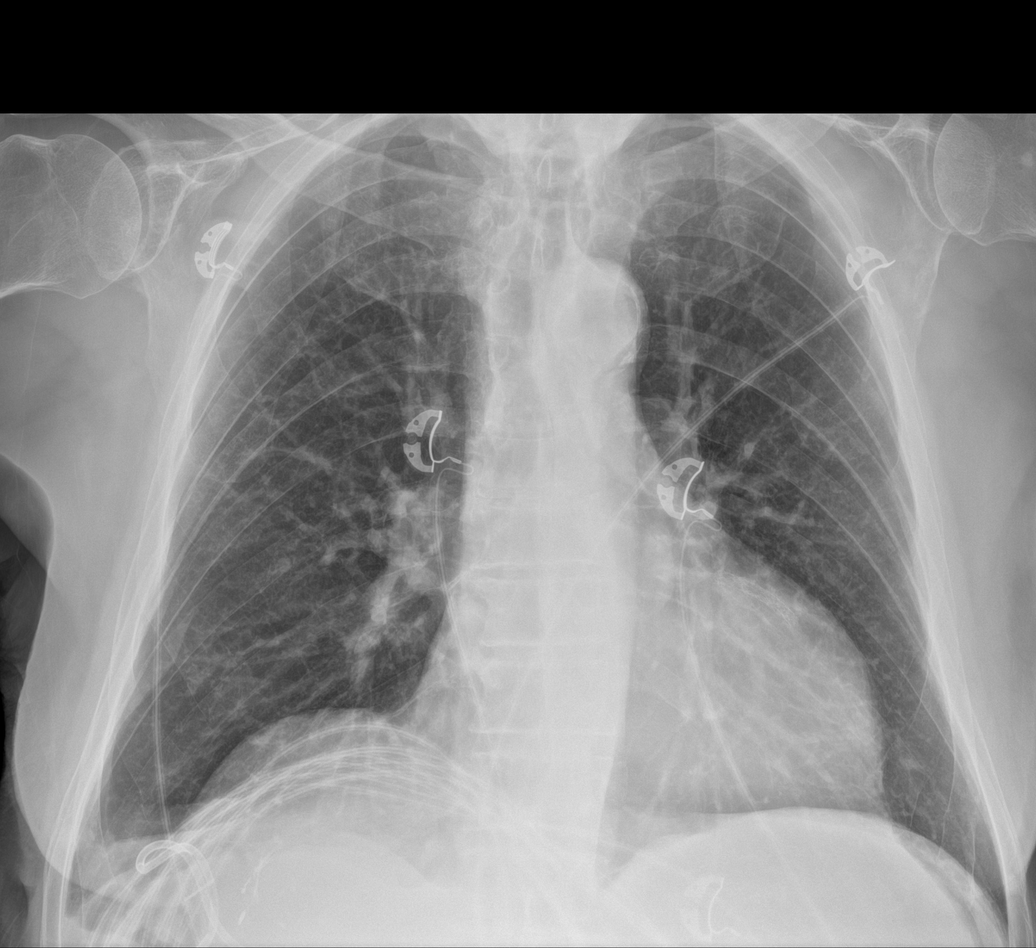

[abdomen kub (1 of 2)]
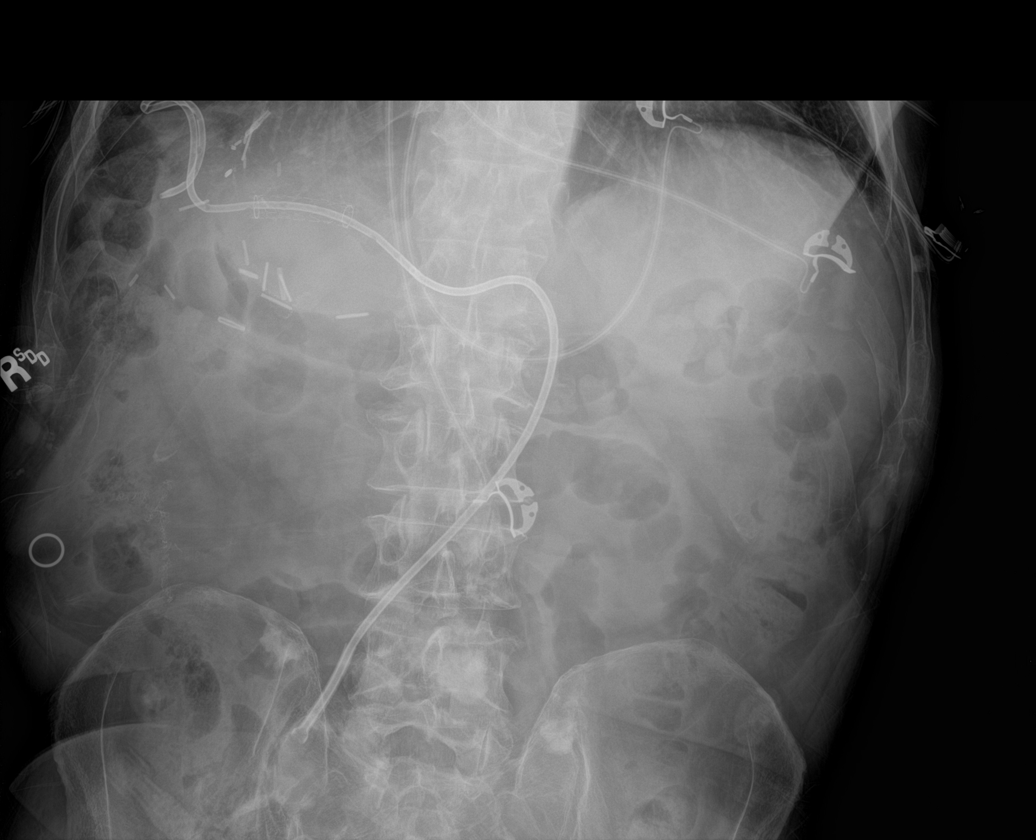

[abdomen kub (2 of 2)]
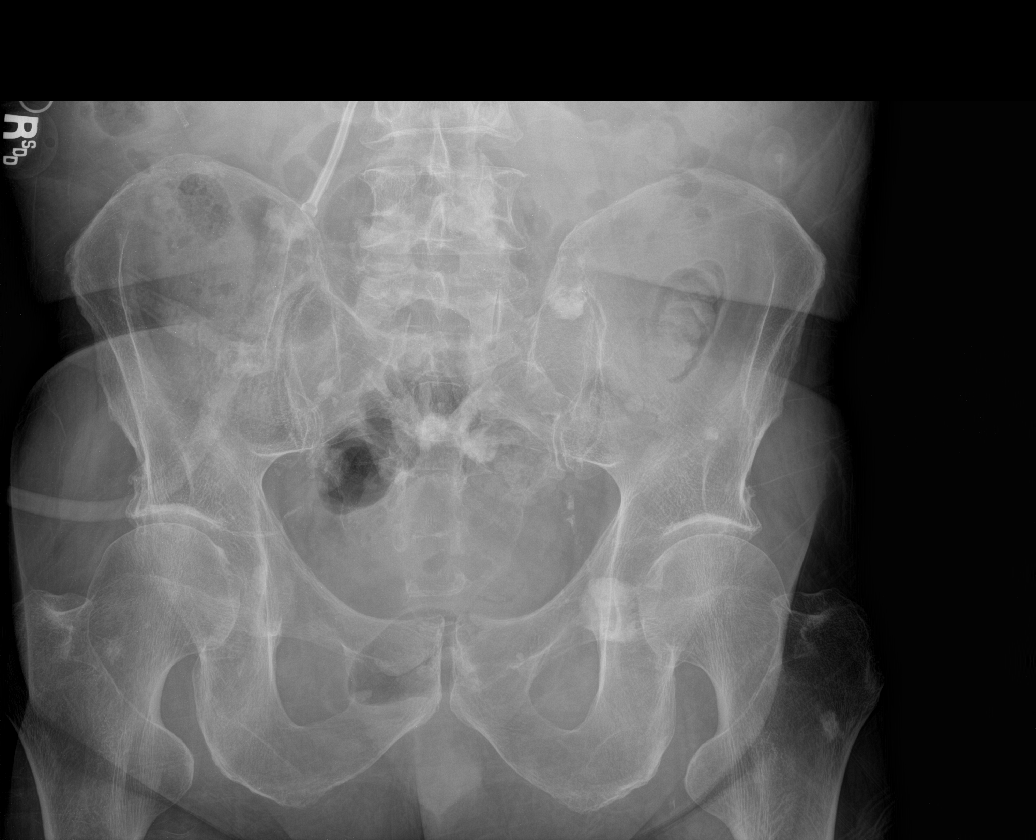

[abdomen erect]
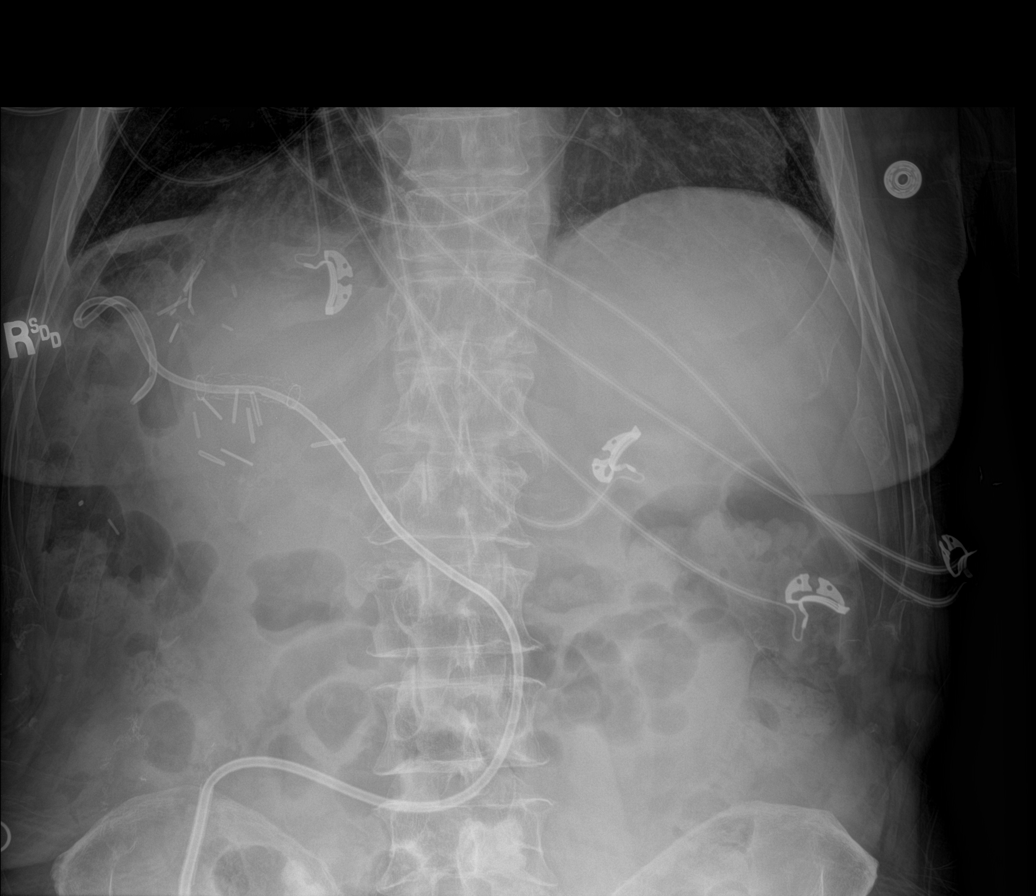

[4 of 4 positions shown; findings below may reference images not displayed]

FINDINGS: Leads project over the anterior chest.

Cardiomediastinal contours are enlarged and accentuated by portable
technique. Lungs are clear. No signs of free air beneath either
right or left hemidiaphragm.

Multiple surgical clips are present in the right upper quadrant.
There is a biliary drain in place passing from the midline towards
the right upper quadrant. This drain passes through a stent likely
at the porta hepatis. No comparison studies for confirmation such as
CT are available.

Postoperative changes related to bowel are noted in the right lower
quadrant. There are no dilated loops of bowel to suggest bowel
obstruction.

Density projecting over the left lateral aspect of L5 and over the
left and right iliac and sacrum. Also did dense sclerosis over the
lower sacrum and left acetabulum as well as left proximal femur.
These areas were present on the previous exam. Stool and gas seen in
the distal colon. Small amount of gas likely present in the rectum.
IMPRESSION: 1. No evidence of bowel obstruction or free air.
2. Signs of biliary stenting and percutaneous biliary drainage
catheter. Postoperative changes in the right upper quadrant
compatible with partial, right, hepatectomy as before. Based on
comparison with 0457 examination the distal portion of the catheter
is likely at the level of the biliary-enteric anastomosis. Correlate
with bilirubin and catheter function.
3. Signs of sclerosis of areas in the lower lumbar spine and pelvis
as described. These are unchanged since [DATE].

## 2021-03-27 DIAGNOSIS — I10 Essential (primary) hypertension: Secondary | ICD-10-CM | POA: Diagnosis not present

## 2021-03-27 DIAGNOSIS — E1165 Type 2 diabetes mellitus with hyperglycemia: Secondary | ICD-10-CM | POA: Diagnosis not present

## 2021-04-11 ENCOUNTER — Ambulatory Visit: Payer: Medicare Other | Admitting: Diagnostic Neuroimaging

## 2021-04-19 ENCOUNTER — Other Ambulatory Visit (HOSPITAL_COMMUNITY): Payer: Self-pay

## 2021-04-23 DIAGNOSIS — K831 Obstruction of bile duct: Secondary | ICD-10-CM | POA: Diagnosis not present

## 2021-04-23 DIAGNOSIS — Z20822 Contact with and (suspected) exposure to covid-19: Secondary | ICD-10-CM | POA: Diagnosis not present

## 2021-04-25 DIAGNOSIS — G4733 Obstructive sleep apnea (adult) (pediatric): Secondary | ICD-10-CM | POA: Diagnosis not present

## 2021-04-25 DIAGNOSIS — K831 Obstruction of bile duct: Secondary | ICD-10-CM | POA: Diagnosis not present

## 2021-04-25 DIAGNOSIS — Z79899 Other long term (current) drug therapy: Secondary | ICD-10-CM | POA: Diagnosis not present

## 2021-04-25 DIAGNOSIS — Z91041 Radiographic dye allergy status: Secondary | ICD-10-CM | POA: Diagnosis not present

## 2021-04-25 DIAGNOSIS — Z888 Allergy status to other drugs, medicaments and biological substances status: Secondary | ICD-10-CM | POA: Diagnosis not present

## 2021-04-25 DIAGNOSIS — N400 Enlarged prostate without lower urinary tract symptoms: Secondary | ICD-10-CM | POA: Diagnosis not present

## 2021-04-25 DIAGNOSIS — I2510 Atherosclerotic heart disease of native coronary artery without angina pectoris: Secondary | ICD-10-CM | POA: Diagnosis not present

## 2021-04-25 DIAGNOSIS — I10 Essential (primary) hypertension: Secondary | ICD-10-CM | POA: Diagnosis not present

## 2021-04-25 DIAGNOSIS — K4090 Unilateral inguinal hernia, without obstruction or gangrene, not specified as recurrent: Secondary | ICD-10-CM | POA: Diagnosis not present

## 2021-04-25 DIAGNOSIS — Z4659 Encounter for fitting and adjustment of other gastrointestinal appliance and device: Secondary | ICD-10-CM | POA: Diagnosis not present

## 2021-04-25 DIAGNOSIS — I4891 Unspecified atrial fibrillation: Secondary | ICD-10-CM | POA: Diagnosis not present

## 2021-04-25 DIAGNOSIS — E785 Hyperlipidemia, unspecified: Secondary | ICD-10-CM | POA: Diagnosis not present

## 2021-04-26 ENCOUNTER — Other Ambulatory Visit (HOSPITAL_COMMUNITY): Payer: Self-pay

## 2021-04-26 DIAGNOSIS — I10 Essential (primary) hypertension: Secondary | ICD-10-CM | POA: Diagnosis not present

## 2021-04-26 DIAGNOSIS — K831 Obstruction of bile duct: Secondary | ICD-10-CM | POA: Diagnosis not present

## 2021-04-26 DIAGNOSIS — G4733 Obstructive sleep apnea (adult) (pediatric): Secondary | ICD-10-CM | POA: Diagnosis not present

## 2021-04-26 DIAGNOSIS — I2510 Atherosclerotic heart disease of native coronary artery without angina pectoris: Secondary | ICD-10-CM | POA: Diagnosis not present

## 2021-04-26 DIAGNOSIS — E1165 Type 2 diabetes mellitus with hyperglycemia: Secondary | ICD-10-CM | POA: Diagnosis not present

## 2021-04-26 DIAGNOSIS — I4891 Unspecified atrial fibrillation: Secondary | ICD-10-CM | POA: Diagnosis not present

## 2021-04-26 DIAGNOSIS — E785 Hyperlipidemia, unspecified: Secondary | ICD-10-CM | POA: Diagnosis not present

## 2021-04-26 MED ORDER — SODIUM CHLORIDE FLUSH 0.9 % IV SOLN
10.0000 mL | Freq: Three times a day (TID) | INTRAVENOUS | 2 refills | Status: DC
  Filled 2021-04-26: qty 900, 30d supply, fill #0
  Filled 2021-06-26: qty 1200, 40d supply, fill #1
  Filled 2021-06-27: qty 600, 20d supply, fill #2

## 2021-04-27 ENCOUNTER — Other Ambulatory Visit (HOSPITAL_COMMUNITY): Payer: Self-pay

## 2021-05-10 ENCOUNTER — Other Ambulatory Visit (INDEPENDENT_AMBULATORY_CARE_PROVIDER_SITE_OTHER): Payer: Self-pay | Admitting: Internal Medicine

## 2021-05-10 NOTE — Telephone Encounter (Signed)
Patient last seen 02/17/2019 for Cholangitis by Dr. [name]

## 2021-05-11 DIAGNOSIS — L578 Other skin changes due to chronic exposure to nonionizing radiation: Secondary | ICD-10-CM | POA: Diagnosis not present

## 2021-05-11 DIAGNOSIS — D229 Melanocytic nevi, unspecified: Secondary | ICD-10-CM | POA: Diagnosis not present

## 2021-05-11 DIAGNOSIS — L814 Other melanin hyperpigmentation: Secondary | ICD-10-CM | POA: Diagnosis not present

## 2021-05-11 DIAGNOSIS — D485 Neoplasm of uncertain behavior of skin: Secondary | ICD-10-CM | POA: Diagnosis not present

## 2021-05-11 DIAGNOSIS — D1801 Hemangioma of skin and subcutaneous tissue: Secondary | ICD-10-CM | POA: Diagnosis not present

## 2021-05-11 DIAGNOSIS — L910 Hypertrophic scar: Secondary | ICD-10-CM | POA: Diagnosis not present

## 2021-05-11 DIAGNOSIS — L821 Other seborrheic keratosis: Secondary | ICD-10-CM | POA: Diagnosis not present

## 2021-05-11 DIAGNOSIS — D225 Melanocytic nevi of trunk: Secondary | ICD-10-CM | POA: Diagnosis not present

## 2021-05-11 DIAGNOSIS — Z23 Encounter for immunization: Secondary | ICD-10-CM | POA: Diagnosis not present

## 2021-05-11 DIAGNOSIS — Z85828 Personal history of other malignant neoplasm of skin: Secondary | ICD-10-CM | POA: Diagnosis not present

## 2021-05-16 NOTE — Telephone Encounter (Signed)
Please schedule office visit.

## 2021-05-18 NOTE — Telephone Encounter (Signed)
Has appt 06/06/21

## 2021-05-23 NOTE — Telephone Encounter (Signed)
Per dr [name] may have #90 with 3 refills

## 2021-05-25 ENCOUNTER — Encounter (INDEPENDENT_AMBULATORY_CARE_PROVIDER_SITE_OTHER): Payer: Medicare Other | Admitting: Ophthalmology

## 2021-06-06 ENCOUNTER — Ambulatory Visit (INDEPENDENT_AMBULATORY_CARE_PROVIDER_SITE_OTHER): Payer: Medicare Other | Admitting: Gastroenterology

## 2021-06-14 DIAGNOSIS — R5383 Other fatigue: Secondary | ICD-10-CM | POA: Diagnosis not present

## 2021-06-14 DIAGNOSIS — D649 Anemia, unspecified: Secondary | ICD-10-CM | POA: Diagnosis not present

## 2021-06-14 DIAGNOSIS — N401 Enlarged prostate with lower urinary tract symptoms: Secondary | ICD-10-CM | POA: Diagnosis not present

## 2021-06-14 DIAGNOSIS — R630 Anorexia: Secondary | ICD-10-CM | POA: Diagnosis not present

## 2021-06-14 DIAGNOSIS — I429 Cardiomyopathy, unspecified: Secondary | ICD-10-CM | POA: Diagnosis not present

## 2021-06-14 DIAGNOSIS — D696 Thrombocytopenia, unspecified: Secondary | ICD-10-CM | POA: Diagnosis not present

## 2021-06-14 DIAGNOSIS — I2510 Atherosclerotic heart disease of native coronary artery without angina pectoris: Secondary | ICD-10-CM | POA: Diagnosis not present

## 2021-06-14 DIAGNOSIS — G609 Hereditary and idiopathic neuropathy, unspecified: Secondary | ICD-10-CM | POA: Diagnosis not present

## 2021-06-14 DIAGNOSIS — K4090 Unilateral inguinal hernia, without obstruction or gangrene, not specified as recurrent: Secondary | ICD-10-CM | POA: Diagnosis not present

## 2021-06-14 DIAGNOSIS — K219 Gastro-esophageal reflux disease without esophagitis: Secondary | ICD-10-CM | POA: Diagnosis not present

## 2021-06-14 DIAGNOSIS — K8309 Other cholangitis: Secondary | ICD-10-CM | POA: Diagnosis not present

## 2021-06-24 ENCOUNTER — Other Ambulatory Visit: Payer: Self-pay | Admitting: Cardiology

## 2021-06-26 ENCOUNTER — Other Ambulatory Visit (HOSPITAL_COMMUNITY): Payer: Self-pay

## 2021-06-27 ENCOUNTER — Other Ambulatory Visit (HOSPITAL_COMMUNITY): Payer: Self-pay

## 2021-07-04 ENCOUNTER — Telehealth: Payer: Self-pay | Admitting: Cardiology

## 2021-07-04 NOTE — Telephone Encounter (Signed)
Wife of patient called. The patient would like to switch from Dr. Domenic Polite to Dr. Harrington Challenger. The Patient would like to see a provider that is in [place] more often. The patient does not want to go up to [facility]

## 2021-07-06 NOTE — Telephone Encounter (Signed)
Contacted to schedule appointment, per wife, patient does not want to switch providers but preferred to be seen in [place] vs [place]. Explained that Domenic Polite splits his time between [place] and [place] and at this time, had no available appointments for March 2023 in [place]. Offered March 2023 appointment in [place] with Bernerd Pho and agreed to appointment on 08/22/21 @2 :30 pm with BS.

## 2021-07-11 ENCOUNTER — Encounter (INDEPENDENT_AMBULATORY_CARE_PROVIDER_SITE_OTHER): Payer: Self-pay

## 2021-07-11 ENCOUNTER — Other Ambulatory Visit: Payer: Self-pay

## 2021-07-11 ENCOUNTER — Encounter (INDEPENDENT_AMBULATORY_CARE_PROVIDER_SITE_OTHER): Payer: Self-pay | Admitting: Gastroenterology

## 2021-07-11 ENCOUNTER — Other Ambulatory Visit (INDEPENDENT_AMBULATORY_CARE_PROVIDER_SITE_OTHER): Payer: Self-pay

## 2021-07-11 ENCOUNTER — Ambulatory Visit (INDEPENDENT_AMBULATORY_CARE_PROVIDER_SITE_OTHER): Payer: Medicare Other | Admitting: Gastroenterology

## 2021-07-11 VITALS — BP 125/82 | HR 66 | Temp 97.9°F | Ht 71.0 in | Wt 186.2 lb

## 2021-07-11 DIAGNOSIS — R1310 Dysphagia, unspecified: Secondary | ICD-10-CM

## 2021-07-11 DIAGNOSIS — K8309 Other cholangitis: Secondary | ICD-10-CM

## 2021-07-11 DIAGNOSIS — R1013 Epigastric pain: Secondary | ICD-10-CM | POA: Diagnosis not present

## 2021-07-11 MED ORDER — URSODIOL 250 MG PO TABS: 250.0000 mg | ORAL_TABLET | Freq: Two times a day (BID) | ORAL | 5 refills | Status: DC

## 2021-07-11 NOTE — Progress Notes (Addendum)
Referring Provider: Celene Squibb, MD Primary Care Physician:  Celene Squibb, MD Primary GI Physician: Rehman  Chief Complaint  Patient presents with   Abdominal Pain    Patient here today with complaints of abdominal pain he was having around the end of the year. He was having Mid epigastric pain and he has a history of a stent placed in the liver and this was where it was hurting. He states the pain has subsided. He states he is feeling weak and has no energy. He has no appetite.   HPI:   Alexander Burton is a 81 y.o. male with past medical history of recurrent cholangitis, BPH, CAD, CPOD, gastritis, hepatic abscess, HLD, Melanoma. PAF, sleep apnea.   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 Patient has undergone multiple previous dilations at [facility] with internal and external stents, last seen by Dr. [name] in Sept 2020. He is on chronic antibiotic therapy, alternating with cipro 1 month, augmentin and then keflex.  Patient presenting today for mid epigastric, abdominal pain that began in November and has since resolved. Notably, patient had biliary tube exchange at [facility] in November 2022. Last seen at [facility] 06/29/21 to set up biliary drain exchange. He is maintained on Urso 250mg  BID.  Today, patient is accompanied by his wife. He States that he is supposed to have biliary stent replacement again in April, plans are to replace this every 4 months at [facility]. Last seen earlier this month at [facility] and states he is doing well in regards to his hepatobiliary issues. He states that back in November he had Covid, and was having some mid upper abdominal pain that lasted about 2-3 weeks and gradually improved, finally resolved on its own, he denies any associated symptoms such as  nausea, vomiting, diarrhea, constipation. He is not having any issues with heart burn or acid reflux, but is having  some issues with dysphagia, this occurs almost daily. He states that he has to take his time eating or else he feels that food does not go down well. He occasionally has to cough the food back up. He denies early satiety, bloating, odynophagia, or weight loss.   He does report that he continues to have chronic fatigue and decrease in appetite, this has been ongoing for the past few years. States he cannot do much activity anymore, as he feels very give out when he tries to. bleeding or melena. He is doing ensure protein shakes a few times a day to help with his nutrition. He is seeing PCP again in a week or so. He has no rectal bleeding or melena. Most recent reviewable labs in nov 2022 with hgb of 12.3 with MCV of 86.   Last Colonoscopy:07/13/11  Last Endoscopy:02/11/20- Normal hypopharynx. - Benign-appearing esophageal stenosis at [facility]. Dilated. - Z-line regular, 40 cm from the incisors. - Portal hypertensive gastropathy. - Normal duodenal bulb and second portion of the duodenum. - No specimens collected.  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 Has now had multiple issues with chronic hepatic  abscess. Currently has indwelling drain   COLONOSCOPY  08/16/2011   Procedure: COLONOSCOPY;  Surgeon: Rogene Houston, MD;  [facility];  Service: Endoscopy;  Laterality: N/A;  1200   ESOPHAGEAL DILATION N/A 01/20/2015   Procedure: ESOPHAGEAL DILATION;  Surgeon: Rogene Houston, MD;  Location: [facility];  Service: Endoscopy;  Laterality: N/A;   ESOPHAGEAL DILATION N/A 02/11/2020   Procedure: ESOPHAGEAL DILATION;  Surgeon: Rogene Houston, MD;  Location: [facility];  Service: Endoscopy;  Laterality: N/A;   ESOPHAGOGASTRODUODENOSCOPY N/A 01/20/2015   Procedure: ESOPHAGOGASTRODUODENOSCOPY (EGD);  Surgeon: Rogene Houston, MD;  [facility];  Service: Endoscopy;  Laterality: N/A;  1200   ESOPHAGOGASTRODUODENOSCOPY N/A 02/11/2020   Procedure: ESOPHAGOGASTRODUODENOSCOPY (EGD);  Surgeon: Rogene Houston, MD;  [facility];  Service: Endoscopy;  Laterality: N/A;  730   LEA DOPPLER  07/28/2008   NORMAL RGT GROIN DUPLEX DOPPLER   liver stent     LIVER SURGERY  09-03/14   @ [facility]   Melanoma resection      Current Outpatient Medications  Medication Sig Dispense Refill   amoxicillin-clavulanate (AUGMENTIN) 875-125 MG tablet Take 1 tablet by mouth 2 (two) times daily. For 30 days  - will start Augmentin in May     cephALEXin (KEFLEX) 500 MG capsule Take 500 mg by mouth 2 (two) times daily.     ciprofloxacin (CIPRO) 500 MG tablet Take 1 tablet by mouth as directed. Patient will take 1 tablet twice a day for 1 month; current regimen is Bactrim - will start Cipro in April     cycloSPORINE (RESTASIS) 0.05 % ophthalmic emulsion Place 1 drop into both eyes 2 (two) times daily.      finasteride (PROSCAR) 5 MG tablet Take 5 mg by mouth daily.     fluorouracil (EFUDEX) 5 % cream Apply topically daily.     metoprolol tartrate (LOPRESSOR) 25 MG tablet TAKE 1/2 TABLET(12.5 MG) BY MOUTH TWICE DAILY 90 tablet 3   mirtazapine (REMERON) 7.5 MG tablet Take 7.5 mg by mouth at  bedtime.     Multiple Vitamin (MULITIVITAMIN WITH MINERALS) TABS Take 1 tablet by mouth daily. Centrum Silver men 50+     Multiple Vitamins-Minerals (PRESERVISION AREDS) CAPS Take 1 capsule by mouth in the morning and at bedtime.      naproxen sodium (ALEVE) 220 MG tablet Take 220 mg by mouth 2 (two) times daily as needed (pain).      nitroGLYCERIN (NITROSTAT) 0.4 MG SL tablet Place 0.4 mg under the tongue every 5 (five) minutes as needed for chest pain.     Oxymetazoline HCl (AFRIN NASAL SPRAY NA) Place 2 sprays into the nose 2 (two) times daily as needed.      pantoprazole (PROTONIX) 40 MG tablet Take 40 mg by mouth at bedtime.      pravastatin (PRAVACHOL) 20 MG tablet TAKE 1 TABLET(20  MG) BY MOUTH DAILY 90 tablet 3   Probiotic Product (PROBIOTIC DAILY PO) Take 1 tablet by mouth daily.     Sodium Chloride Flush (NORMAL SALINE FLUSH) 0.9 % SOLN Use 10 mLs by Intracatheter route 3 (three) times daily as needed for Line Care. 900 mL 6   sodium chloride flush 0.9 % SOLN injection Use 10 mLs by Intracatheter 3 (three) times daily. 900 mL 2   tamsulosin (FLOMAX) 0.4 MG CAPS capsule Take 0.4 mg by mouth in the morning and at bedtime.   3   traMADol (ULTRAM) 50 MG tablet Take 1 tablet by mouth every 6 (six) hours as needed.     ursodiol (ACTIGALL) 250 MG tablet TAKE 1 TABLET(250 MG) BY MOUTH IN THE MORNING AND AT BEDTIME 90 tablet 3   No current facility-administered medications for this visit.    Allergies as of 07/11/2021 - Review Complete 07/11/2021  Allergen Reaction Noted   Contrast media [iodinated contrast media]  07/29/2013   Metrizamide Hives 07/29/2013   Other Itching and Other (See Comments) 11/25/2012    Family History  Problem Relation Age of Onset   Colon cancer Sister    Heart disease Mother    Emphysema Father     Social History   Socioeconomic History   Marital status: Married    Spouse name: Not on file   Number of children: 2   Years of education: Not on file    Highest education level: Not on file  Occupational History    Employer: RETIRED  Tobacco Use   Smoking status: Former    Packs/day: 1.50    Years: 50.00    Pack years: 75.00    Types: Cigarettes    Quit date: 05/29/2007    Years since quitting: 14.1   Smokeless tobacco: Never  Vaping Use   Vaping Use: Never used  Substance and Sexual Activity   Alcohol use: Not Currently    Alcohol/week: 1.0 standard drink    Types: 1 Standard drinks or equivalent per week    Comment: very rare   Drug use: No   Sexual activity: Not Currently  Other Topics Concern   Not on file  Social History Narrative   Married father to come a grandfather 65. 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 Has more fatigue with walking but still able to walk up to 2 miles prior to his most recent hospitalization.   Social Determinants of Health   Financial Resource Strain: Not on file  Food Insecurity: Not on file  Transportation Needs: Not on file  Physical Activity: Not on file  Stress: Not on file  Social Connections: Not on file   Review of systems General: negative for night sweats, fever, chills, weight loss +fatigue Neck: Negative for lumps, goiter, pain and significant neck swelling Resp: Negative for cough, wheezing, dyspnea at rest CV: Negative for chest pain, leg swelling, palpitations, orthopnea GI: denies melena, hematochezia, nausea, vomiting, diarrhea, constipation,  odyonophagia, early satiety or unintentional weight loss. +dysphagia +decreased appetite MSK: Negative for joint pain or swelling, back pain, and muscle pain. Derm: Negative for itching or rash Psych: Denies depression, anxiety, memory loss, confusion. No homicidal or suicidal ideation.  Heme: Negative for prolonged bleeding, bruising easily, and swollen nodes. Endocrine: Negative for cold or heat intolerance, polyuria, polydipsia and goiter. Neuro: negative for tremor, gait imbalance, syncope and seizures. The remainder of  the review of systems is noncontributory.  Physical Exam: BP 125/82 (BP Location: Left  Arm, Patient Position: Sitting, Cuff Size: Large)    Pulse 66    Temp 97.9 F (36.6 C) (Oral)    Ht 5\' 11"  (1.803 m)    Wt 186 lb 3.2 oz (84.5 kg)    BMI 25.97 kg/m  General:   Alert and oriented. No distress noted. Pleasant and cooperative.  Head:  Normocephalic and atraumatic. Eyes:  Conjuctiva clear without scleral icterus. Mouth:  Oral mucosa pink and moist. Good dentition. No lesions. Heart: Normal rate and rhythm, s1 and s2 heart sounds present.  Lungs: Clear lung sounds in all lobes. Respirations equal and unlabored. Abdomen:  +BS, soft, non-tender and non-distended. No rebound or guarding. No HSM or masses noted. Derm: No palmar erythema or jaundice Msk:  Symmetrical without gross deformities. Normal posture. Extremities:  Without edema. Neurologic:  Alert and  oriented x4 Psych:  Alert and cooperative. Normal mood and affect.  Invalid input(s): 6 MONTHS   ASSESSMENT: MUJAHID JALOMO is a 81 y.o. male presenting today for epigastric pain that began in November.  Epigastric pain began in November during acute covid, no other associated symptoms. States that pain lasted about 2-3 weeks and resolved spontaneously. Likely a secondary component/symptom of his covid infection. Notably he is having no issues with acid reflux but does endorse frequent dysphagia, almost daily. Previous benign appearing esophageal stenosis, s/p dilation during EGD in Sept 2021. We will get him set up for EGD +/- dilation, for further evaluation of this. Reassuringly he is without alarm symptoms.   He continues to endorse ongoing fatigue and decreased appetite, per chart review it appears this has been ongoing for atleast the past 2 years. Most recent labs I can review from Nov 2022 are relatively WNL, other than slightly low hgb of 12.3, however, MCV indicates normocytic anemia, this appears to be a chronic issue dating  back atleast 6-7 years. He has no rectal bleeding or melena. He had recent labs done about 1 month ago with PCP, we will obtain these for further review. I do not suspect this is secondary to his very mild anemia, though I think it would be important for him to have thyroid function and vitamin D levels checked with his PCP to rule out any underlying issues with these as the cause. He is seeing PCP again in 1-2 weeks and will discuss ongoing fatigue and anorexia then. Reassuringly his weight has remained stable.   He is scheduled for biliary stent replacement again in April at [facility], he is maintained on Ursodiol 250mg  BID, tolerating this well. We will continue with current regimen. He continues to alternate abx therapy every month at direction of his Doctor at [facility], and is maintained on a probiotic without any diarrhea or constipation. Most recent LFTs in November 2022 are WNL. will review labs from PCP as well for more updated LFTs.   PLAN:  Schedule EGD 2.Obtain labs from Dr. Nevada Crane 3. Refill for Ursodiol 250mg  BID sent to pharmacy 4. Continue protein shakes 2-3x/day, in between meals 5. Discuss fatigue with PCP, recommend checking TSH and Vitamin D levels   Follow Up: 1 year  Chivonne Rascon L. Alver Sorrow, MSN, APRN, AGNP-C Adult-Gerontology Nurse Practitioner [facility]

## 2021-07-11 NOTE — Patient Instructions (Signed)
We will get you scheduled for upper endoscopy for further evaluation of your swallowing difficulty I have sent a refill for Ursodiol 250mg  to your pharmacy, continue taking one in the morning and one at bedtime I will obtain most recent labs from Dr. Nevada Crane so that we have these on file here in your chart Please discuss ongoing fatigue and decreased appetite with Dr. Nevada Crane, vitamin D levels and thyroid function may need to be checked to ensure these are not underlying causes of your symptoms Continue to drink boost/ensure shakes 2-3x/day to help maintain weight and get needed nutrients, it is still helpful to try and do 5-6 smaller meals throughout the day with shakes as snacks/before bed, not in place of meals. Continue to chew thoroughly and take small bites with sips of liquids in between to avoid choking  Follow up 1 year

## 2021-07-12 ENCOUNTER — Encounter (INDEPENDENT_AMBULATORY_CARE_PROVIDER_SITE_OTHER): Payer: Self-pay

## 2021-07-12 NOTE — Telephone Encounter (Signed)
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

## 2021-07-18 ENCOUNTER — Other Ambulatory Visit: Payer: Self-pay

## 2021-07-18 ENCOUNTER — Ambulatory Visit (INDEPENDENT_AMBULATORY_CARE_PROVIDER_SITE_OTHER): Payer: Medicare Other | Admitting: Ophthalmology

## 2021-07-18 ENCOUNTER — Encounter (INDEPENDENT_AMBULATORY_CARE_PROVIDER_SITE_OTHER): Payer: Self-pay | Admitting: Ophthalmology

## 2021-07-18 DIAGNOSIS — H353132 Nonexudative age-related macular degeneration, bilateral, intermediate dry stage: Secondary | ICD-10-CM

## 2021-07-18 DIAGNOSIS — H43812 Vitreous degeneration, left eye: Secondary | ICD-10-CM

## 2021-07-18 DIAGNOSIS — H43822 Vitreomacular adhesion, left eye: Secondary | ICD-10-CM

## 2021-07-18 DIAGNOSIS — H43821 Vitreomacular adhesion, right eye: Secondary | ICD-10-CM

## 2021-07-18 NOTE — Assessment & Plan Note (Signed)
Condition resolved and the natural physiologic PVD condition and also with near resolution of subfoveal portions of drusenoid change from AMD

## 2021-07-18 NOTE — Assessment & Plan Note (Signed)
Stable patient continues on oral vitamin therapy

## 2021-07-18 NOTE — Progress Notes (Signed)
07/18/2021     CHIEF COMPLAINT Patient presents for  Chief Complaint  Patient presents with   Retina Follow Up   Macular Degeneration      HISTORY OF PRESENT ILLNESS: Alexander Burton is a 81 y.o. male who presents to the clinic today for:   HPI     Retina Follow Up           Diagnosis: Other (Vitreomacular adhesion)   Laterality: left eye   Onset: 1 year ago   Severity: mild         Comments   1 yr fu OU oct. Patient states vision is stable and unchanged since last visit. Denies any new floaters or FOL.  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      Referring physician: Celene Squibb, MD [place],  [place] [address]  HISTORICAL INFORMATION:   Selected notes from the MEDICAL RECORD NUMBER       CURRENT MEDICATIONS: Current Outpatient Medications (Ophthalmic Drugs)  Medication Sig   cycloSPORINE (RESTASIS) 0.05 % ophthalmic emulsion Place 1 drop into both eyes 2 (two) times daily.    No current facility-administered medications for this visit. (Ophthalmic Drugs)   Current Outpatient Medications (Other)  Medication Sig   amoxicillin-clavulanate (AUGMENTIN) 875-125 MG tablet Take 1 tablet by mouth 2 (two) times daily. For 30 days  - will start Augmentin in May   cephALEXin (KEFLEX) 500 MG capsule Take 500 mg by mouth 2 (two) times daily.   ciprofloxacin (CIPRO) 500 MG tablet Take 1 tablet by mouth as directed. Patient will take 1 tablet twice a day for 1 month; current regimen is Bactrim - will start Cipro in April   finasteride (PROSCAR) 5 MG tablet Take 5 mg by mouth daily.   fluorouracil (EFUDEX) 5 % cream Apply topically daily.   metoprolol tartrate (LOPRESSOR) 25 MG tablet TAKE 1/2 TABLET(12.5 MG) BY MOUTH TWICE DAILY   mirtazapine (REMERON) 7.5 MG tablet Take 7.5 mg by mouth at bedtime.   Multiple  Vitamin (MULITIVITAMIN WITH MINERALS) TABS Take 1 tablet by mouth daily. Centrum Silver men 50+   Multiple Vitamins-Minerals (PRESERVISION AREDS) CAPS Take 1 capsule by mouth in the morning and at bedtime.    naproxen sodium (ALEVE) 220 MG tablet Take 220 mg by mouth 2 (two) times daily as needed (pain).    nitroGLYCERIN (NITROSTAT) 0.4 MG SL tablet Place 0.4 mg under the tongue every 5 (five) minutes as needed for chest pain.   Oxymetazoline HCl (AFRIN NASAL SPRAY NA) Place 2 sprays into the nose 2 (two) times daily as needed.    pantoprazole (PROTONIX) 40 MG tablet Take 40 mg by mouth at bedtime.    pravastatin (PRAVACHOL) 20 MG tablet TAKE 1 TABLET(20 MG) BY MOUTH DAILY   Probiotic Product (PROBIOTIC DAILY PO) Take 1 tablet by mouth daily.   Sodium Chloride Flush (NORMAL SALINE FLUSH) 0.9 % SOLN Use 10 mLs by Intracatheter route 3 (three) times daily as needed for Line Care.   sodium chloride flush 0.9 % SOLN injection Use 10 mLs by Intracatheter 3 (three) times daily.   tamsulosin (FLOMAX) 0.4 MG CAPS capsule Take 0.4 mg by mouth in the morning and at bedtime.    traMADol (ULTRAM) 50 MG tablet Take 1 tablet by mouth every 6 (six)  hours as needed.   ursodiol (ACTIGALL) 250 MG tablet Take 1 tablet (250 mg total) by mouth in the morning and at bedtime.   No current facility-administered medications for this visit. (Other)      REVIEW OF SYSTEMS: ROS   Negative for: Constitutional, Gastrointestinal, Neurological, Skin, Genitourinary, Musculoskeletal, HENT, Endocrine, Cardiovascular, Eyes, Respiratory, Psychiatric, Allergic/Imm, Heme/Lymph Last edited by Hurman Horn, MD on 07/18/2021 11:45 AM.       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 Has now had multiple issues with chronic hepatic abscess. Currently has indwelling drain   COLONOSCOPY  08/16/2011   Procedure: COLONOSCOPY;  Surgeon: Rogene Houston, MD;  [facility];  Service: Endoscopy;  Laterality: N/A;  1200   ESOPHAGEAL DILATION N/A 01/20/2015   Procedure: ESOPHAGEAL DILATION;  Surgeon: Rogene Houston, MD;  [facility];  Service: Endoscopy;  Laterality: N/A;   ESOPHAGEAL DILATION N/A 02/11/2020   Procedure: ESOPHAGEAL DILATION;  Surgeon: Rogene Houston, MD;  [facility];  Service: Endoscopy;  Laterality: N/A;   ESOPHAGOGASTRODUODENOSCOPY N/A 01/20/2015   Procedure: ESOPHAGOGASTRODUODENOSCOPY (EGD);  Surgeon: Rogene Houston, MD;  [facility];  Service: Endoscopy;  Laterality: N/A;  1200   ESOPHAGOGASTRODUODENOSCOPY N/A 02/11/2020   Procedure: ESOPHAGOGASTRODUODENOSCOPY (EGD);  Surgeon: Rogene Houston, MD;  [facility];  Service: Endoscopy;  Laterality: N/A;  730   LEA DOPPLER  07/28/2008   NORMAL RGT GROIN DUPLEX DOPPLER   liver stent     LIVER SURGERY  09-03/14   @ [facility]   Melanoma resection      FAMILY HISTORY Family  History  Problem Relation Age of Onset   Colon cancer Sister    Heart disease Mother    Emphysema Father     SOCIAL HISTORY Social History   Tobacco Use   Smoking status: Former    Packs/day: 1.50    Years: 50.00    Pack years: 75.00    Types: Cigarettes    Quit date: 05/29/2007    Years since quitting: 14.1   Smokeless tobacco: Never  Vaping Use   Vaping Use: Never used  Substance Use Topics   Alcohol use: Not Currently    Alcohol/week: 1.0 standard drink    Types:  1 Standard drinks or equivalent per week    Comment: very rare   Drug use: No         OPHTHALMIC EXAM:  Base Eye Exam     Visual Acuity (ETDRS)       Right Left   Dist [place] 20/30 -2 20/50   Dist ph [place]  20/25 -2         Tonometry (Tonopen, 11:03 AM)       Right Left   Pressure 18 17         Pupils       Pupils Dark Light Shape React APD   Right PERRL 4 3 Round Brisk None   Left PERRL 3 3 Round Minimal None         Visual Fields (Counting fingers)       Left Right    Full Full         Extraocular Movement       Right Left    Full Full         Neuro/Psych     Oriented x3: Yes   Mood/Affect: Normal         Dilation     Both eyes: 1.0% Mydriacyl, 2.5% Phenylephrine @ 11:03 AM           Slit Lamp and Fundus Exam     External Exam       Right Left   External Normal Normal         Slit Lamp Exam       Right Left   Lids/Lashes Normal Normal   Conjunctiva/Sclera White and quiet White and quiet   Cornea Clear Clear   Anterior Chamber Deep and quiet Deep and quiet   Iris Round and reactive Round and reactive   Lens Centered posterior chamber intraocular lens Centered posterior chamber intraocular lens   Anterior Vitreous Normal Normal         Fundus Exam       Right Left   Posterior Vitreous Normal Posterior vitreous detachment   Disc Normal Normal   C/D Ratio 0.5 0.5   Macula Drusen, Intermediate age related macular degeneration no exudates, no  hemorrhage   Vessels Normal Normal   Periphery Normal Normal            IMAGING AND PROCEDURES  Imaging and Procedures for 07/18/21  OCT, Retina - OU - Both Eyes       Right Eye Quality was good. Scan locations included subfoveal. Central Foveal Thickness: 271. Progression has been stable. Findings include vitreomacular adhesion , no IRF, no SRF, retinal drusen .   Left Eye Quality was good. Scan locations included subfoveal. Central Foveal Thickness: 260. Progression has been stable. Findings include no IRF, no SRF, retinal drusen .   Notes OD with persistent PVD with subfoveal drusenoid findings And almost none other seen throughout the macula on clinical examination. OS with near complete resolution of drusenoid findings in the subfoveal location coincident With less tangential traction from VMA also with institution of oral vitamin therapy in 2017             ASSESSMENT/PLAN:  Vitreomacular adhesion of left eye Condition resolved and the natural physiologic PVD condition and also with near resolution of subfoveal portions of drusenoid change from AMD  Posterior vitreous detachment of left eye Ration of VMA now to PVD OS, with resolved subfoveal drusen  Intermediate stage nonexudative age-related macular degeneration of both eyes Stable patient continues on  oral vitamin therapy  Vitreomacular adhesion of right eye OD persistent coincident also with localized subfoveal drusen     ICD-10-CM   1. Vitreomacular adhesion of left eye  H43.822 OCT, Retina - OU - Both Eyes    2. Posterior vitreous detachment of left eye  H43.812     3. Intermediate stage nonexudative age-related macular degeneration of both eyes  H35.3132     4. Vitreomacular adhesion of right eye  H43.821       1.  Follow-up in 6 months monitor OD and OS carefully  2.  3.  Ophthalmic Meds Ordered this visit:  No orders of the defined types were placed in this encounter.      Return in  about 6 months (around 01/15/2022) for DILATE OU, COLOR FP, OCT.  There are no Patient Instructions on file for this visit.   Explained the diagnoses, plan, and follow up with the patient and they expressed understanding.  Patient expressed understanding of the importance of proper follow up care.   Clent Demark Keyarra Rendall M.D. Diseases & Surgery of the Retina and Vitreous [facility] [place] 07/18/21     Abbreviations: M myopia (nearsighted); A astigmatism; H hyperopia (farsighted); P presbyopia; Mrx spectacle prescription;  CTL contact lenses; OD right eye; OS left eye; OU both eyes  XT exotropia; ET esotropia; PEK punctate epithelial keratitis; PEE punctate epithelial erosions; DES dry eye syndrome; MGD meibomian gland dysfunction; ATs artificial tears; PFAT's preservative free artificial tears; [place] nuclear sclerotic cataract; PSC posterior subcapsular cataract; ERM epi-retinal membrane; PVD posterior vitreous detachment; RD retinal detachment; DM diabetes mellitus; DR diabetic retinopathy; NPDR non-proliferative diabetic retinopathy; PDR proliferative diabetic retinopathy; CSME clinically significant macular edema; DME diabetic macular edema; dbh dot blot hemorrhages; CWS cotton wool spot; POAG primary open angle glaucoma; C/D cup-to-disc ratio; HVF humphrey visual field; GVF goldmann visual field; OCT optical coherence tomography; IOP intraocular pressure; BRVO Branch retinal vein occlusion; CRVO central retinal vein occlusion; CRAO central retinal artery occlusion; BRAO branch retinal artery occlusion; RT retinal tear; SB scleral buckle; PPV pars plana vitrectomy; VH Vitreous hemorrhage; PRP panretinal laser photocoagulation; IVK intravitreal kenalog; VMT vitreomacular traction; MH Macular hole;  NVD neovascularization of the disc; NVE neovascularization elsewhere; AREDS age related eye disease study; ARMD age related macular degeneration; POAG primary open angle glaucoma; EBMD  epithelial/anterior basement membrane dystrophy; ACIOL anterior chamber intraocular lens; IOL intraocular lens; PCIOL posterior chamber intraocular lens; Phaco/IOL phacoemulsification with intraocular lens placement; [place] photorefractive keratectomy; LASIK laser assisted in situ keratomileusis; HTN hypertension; DM diabetes mellitus; COPD chronic obstructive pulmonary disease

## 2021-07-18 NOTE — Assessment & Plan Note (Signed)
OD persistent coincident also with localized subfoveal drusen

## 2021-07-18 NOTE — Assessment & Plan Note (Signed)
Ration of VMA now to PVD OS, with resolved subfoveal drusen

## 2021-07-24 DIAGNOSIS — E782 Mixed hyperlipidemia: Secondary | ICD-10-CM | POA: Diagnosis not present

## 2021-07-26 DIAGNOSIS — K4090 Unilateral inguinal hernia, without obstruction or gangrene, not specified as recurrent: Secondary | ICD-10-CM | POA: Diagnosis not present

## 2021-07-26 DIAGNOSIS — L723 Sebaceous cyst: Secondary | ICD-10-CM | POA: Diagnosis not present

## 2021-07-26 DIAGNOSIS — I480 Paroxysmal atrial fibrillation: Secondary | ICD-10-CM | POA: Diagnosis not present

## 2021-07-26 DIAGNOSIS — D696 Thrombocytopenia, unspecified: Secondary | ICD-10-CM | POA: Diagnosis not present

## 2021-07-26 DIAGNOSIS — K8309 Other cholangitis: Secondary | ICD-10-CM | POA: Diagnosis not present

## 2021-07-26 DIAGNOSIS — D649 Anemia, unspecified: Secondary | ICD-10-CM | POA: Diagnosis not present

## 2021-07-26 DIAGNOSIS — N401 Enlarged prostate with lower urinary tract symptoms: Secondary | ICD-10-CM | POA: Diagnosis not present

## 2021-07-26 DIAGNOSIS — I2510 Atherosclerotic heart disease of native coronary artery without angina pectoris: Secondary | ICD-10-CM | POA: Diagnosis not present

## 2021-07-26 DIAGNOSIS — I429 Cardiomyopathy, unspecified: Secondary | ICD-10-CM | POA: Diagnosis not present

## 2021-07-26 DIAGNOSIS — K219 Gastro-esophageal reflux disease without esophagitis: Secondary | ICD-10-CM | POA: Diagnosis not present

## 2021-07-26 DIAGNOSIS — E782 Mixed hyperlipidemia: Secondary | ICD-10-CM | POA: Diagnosis not present

## 2021-07-26 DIAGNOSIS — G609 Hereditary and idiopathic neuropathy, unspecified: Secondary | ICD-10-CM | POA: Diagnosis not present

## 2021-08-09 DIAGNOSIS — C44321 Squamous cell carcinoma of skin of nose: Secondary | ICD-10-CM | POA: Diagnosis not present

## 2021-08-09 DIAGNOSIS — L820 Inflamed seborrheic keratosis: Secondary | ICD-10-CM | POA: Diagnosis not present

## 2021-08-09 DIAGNOSIS — L570 Actinic keratosis: Secondary | ICD-10-CM | POA: Diagnosis not present

## 2021-08-09 DIAGNOSIS — C44391 Other specified malignant neoplasm of skin of nose: Secondary | ICD-10-CM | POA: Diagnosis not present

## 2021-08-09 DIAGNOSIS — Z1283 Encounter for screening for malignant neoplasm of skin: Secondary | ICD-10-CM | POA: Diagnosis not present

## 2021-08-09 DIAGNOSIS — X32XXXA Exposure to sunlight, initial encounter: Secondary | ICD-10-CM | POA: Diagnosis not present

## 2021-08-17 ENCOUNTER — Ambulatory Visit: Payer: Medicare Other | Admitting: Cardiology

## 2021-08-22 ENCOUNTER — Ambulatory Visit: Payer: Medicare Other | Admitting: Student

## 2021-08-24 DIAGNOSIS — R972 Elevated prostate specific antigen [PSA]: Secondary | ICD-10-CM | POA: Diagnosis not present

## 2021-08-24 DIAGNOSIS — N529 Male erectile dysfunction, unspecified: Secondary | ICD-10-CM | POA: Diagnosis not present

## 2021-08-24 DIAGNOSIS — N401 Enlarged prostate with lower urinary tract symptoms: Secondary | ICD-10-CM | POA: Diagnosis not present

## 2021-08-25 ENCOUNTER — Other Ambulatory Visit: Payer: Self-pay

## 2021-08-25 MED ORDER — SODIUM CHLORIDE FLUSH 0.9 % IV SOLN: INTRAVENOUS | 6 refills | Status: DC

## 2021-08-26 DIAGNOSIS — K831 Obstruction of bile duct: Secondary | ICD-10-CM | POA: Diagnosis not present

## 2021-08-26 DIAGNOSIS — U071 COVID-19: Secondary | ICD-10-CM | POA: Diagnosis not present

## 2021-09-04 DIAGNOSIS — Z85820 Personal history of malignant melanoma of skin: Secondary | ICD-10-CM | POA: Diagnosis not present

## 2021-09-04 DIAGNOSIS — I10 Essential (primary) hypertension: Secondary | ICD-10-CM | POA: Diagnosis not present

## 2021-09-04 DIAGNOSIS — Z86718 Personal history of other venous thrombosis and embolism: Secondary | ICD-10-CM | POA: Diagnosis not present

## 2021-09-04 DIAGNOSIS — Z4689 Encounter for fitting and adjustment of other specified devices: Secondary | ICD-10-CM | POA: Diagnosis not present

## 2021-09-04 DIAGNOSIS — I4891 Unspecified atrial fibrillation: Secondary | ICD-10-CM | POA: Diagnosis not present

## 2021-09-04 DIAGNOSIS — I2510 Atherosclerotic heart disease of native coronary artery without angina pectoris: Secondary | ICD-10-CM | POA: Diagnosis not present

## 2021-09-04 DIAGNOSIS — Z87891 Personal history of nicotine dependence: Secondary | ICD-10-CM | POA: Diagnosis not present

## 2021-09-04 DIAGNOSIS — Z79899 Other long term (current) drug therapy: Secondary | ICD-10-CM | POA: Diagnosis not present

## 2021-09-04 DIAGNOSIS — I739 Peripheral vascular disease, unspecified: Secondary | ICD-10-CM | POA: Diagnosis not present

## 2021-09-04 DIAGNOSIS — K831 Obstruction of bile duct: Secondary | ICD-10-CM | POA: Diagnosis not present

## 2021-09-04 DIAGNOSIS — E785 Hyperlipidemia, unspecified: Secondary | ICD-10-CM | POA: Diagnosis not present

## 2021-09-04 DIAGNOSIS — N400 Enlarged prostate without lower urinary tract symptoms: Secondary | ICD-10-CM | POA: Diagnosis not present

## 2021-09-04 DIAGNOSIS — Z4659 Encounter for fitting and adjustment of other gastrointestinal appliance and device: Secondary | ICD-10-CM | POA: Diagnosis not present

## 2021-09-04 DIAGNOSIS — Z955 Presence of coronary angioplasty implant and graft: Secondary | ICD-10-CM | POA: Diagnosis not present

## 2021-09-04 DIAGNOSIS — G4733 Obstructive sleep apnea (adult) (pediatric): Secondary | ICD-10-CM | POA: Diagnosis not present

## 2021-09-05 DIAGNOSIS — Z4689 Encounter for fitting and adjustment of other specified devices: Secondary | ICD-10-CM | POA: Diagnosis not present

## 2021-09-05 DIAGNOSIS — N400 Enlarged prostate without lower urinary tract symptoms: Secondary | ICD-10-CM | POA: Diagnosis not present

## 2021-09-05 DIAGNOSIS — Z86718 Personal history of other venous thrombosis and embolism: Secondary | ICD-10-CM | POA: Diagnosis not present

## 2021-09-05 DIAGNOSIS — K831 Obstruction of bile duct: Secondary | ICD-10-CM | POA: Diagnosis not present

## 2021-09-05 DIAGNOSIS — T85590D Other mechanical complication of bile duct prosthesis, subsequent encounter: Secondary | ICD-10-CM | POA: Diagnosis not present

## 2021-09-05 DIAGNOSIS — Z9049 Acquired absence of other specified parts of digestive tract: Secondary | ICD-10-CM | POA: Diagnosis not present

## 2021-09-05 DIAGNOSIS — Z955 Presence of coronary angioplasty implant and graft: Secondary | ICD-10-CM | POA: Diagnosis not present

## 2021-09-05 DIAGNOSIS — I10 Essential (primary) hypertension: Secondary | ICD-10-CM | POA: Diagnosis not present

## 2021-09-05 DIAGNOSIS — I2510 Atherosclerotic heart disease of native coronary artery without angina pectoris: Secondary | ICD-10-CM | POA: Diagnosis not present

## 2021-09-11 ENCOUNTER — Other Ambulatory Visit (HOSPITAL_COMMUNITY): Payer: Medicare Other

## 2021-09-13 ENCOUNTER — Encounter (HOSPITAL_COMMUNITY): Payer: Self-pay

## 2021-09-13 ENCOUNTER — Ambulatory Visit (HOSPITAL_COMMUNITY): Admit: 2021-09-13 | Payer: Medicare Other | Admitting: Internal Medicine

## 2021-09-13 SURGERY — ESOPHAGOGASTRODUODENOSCOPY (EGD) WITH PROPOFOL
Anesthesia: Monitor Anesthesia Care

## 2021-09-13 NOTE — Progress Notes (Signed)
? ?Cardiology Office Note   ? ?Date:  09/16/2021  ? ?ID:  Alexander Burton, DOB 11-11-1940, MRN [id number] ? ?PCP:  Celene Squibb, MD  ?Cardiologist: Rozann Lesches, MD   ? ?Chief Complaint  ?Patient presents with  ? Follow-up  ?  6 month visit  ? ? ?History of Present Illness:   ? ?Alexander Burton is a 81 y.o. 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  ? ?He was last examined by Dr. MDomenic Politein 01/2021 and denied any recent anginal symptoms. He was continued on Lopressor 12.'5mg'$  BID and Pravastatin '20mg'$  daily. He was no longer on anticoagulation or ASA given his thrombocytopenia.  ? ?In talking with the patient and his wife today, he reports having progressive dyspnea on exertion for the past 2 to 3 months and notices this mostly when doing activities outside. He denies any associated chest pain or palpitations. Feels that dyspnea was his prior anginal symptom when he required stent placement in 2010. No recent orthopnea, PND or pitting edema. ? ? ?Past Medical History:  ?Diagnosis Date  ? BPH (benign prostatic hyperplasia)   ? CAD (coronary artery disease)   ? DES to LAD 06/2008  ? Colon polyps   ? [place]) [place]   ? Enlarged prostate   ? Gastritis   ? 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 Resulting from distant surgery  ? History of syncope January 2010  ? PVC's and14 beats NSVT as well as atrial afib  ? Hyperlipidemia   ? [place]   ? PAF (paroxysmal atrial fibrillation) ([place]   ? Sleep apnea 04/02/2005  ? ? ?Past Surgical History:  ?Procedure Laterality Date  ? CARDIAC CATHETERIZATION  Feb 2010  ? post DES to LAD ,3.5 X 33 mm Cypher  stent,dilated up to 3.8 mm 80 to90% stenosis btwn first and second diag branc w.moderate disease EF 50%  ? CHOLECYSTECTOMY    ? Complicated by rupture of common bile duct and other vascular structures. Has now had multiple issues with chronic hepatic abscess. Currently has indwelling drain  ? COLONOSCOPY  08/16/2011  ? Procedure: COLONOSCOPY;  Surgeon: NRogene Houston MD;  [facility];  Service: Endoscopy;  Laterality: N/A;  1200  ? ESOPHAGEAL DILATION N/A 01/20/2015  ? Procedure: ESOPHAGEAL DILATION;  Surgeon: NRogene Houston MD;  [facility];  Service: Endoscopy;  Laterality: N/A;  ? ESOPHAGEAL DILATION N/A 02/11/2020  ? Procedure: ESOPHAGEAL DILATION;  Surgeon: RRogene Houston MD;  [facility];  Service: Endoscopy;  Laterality: N/A;  ? ESOPHAGOGASTRODUODENOSCOPY N/A 01/20/2015  ? Procedure: ESOPHAGOGASTRODUODENOSCOPY (EGD);  Surgeon: NRogene Houston MD;  [facility];  Service: Endoscopy;  Laterality: N/A;  1200  ? ESOPHAGOGASTRODUODENOSCOPY N/A 02/11/2020  ? Procedure: ESOPHAGOGASTRODUODENOSCOPY (EGD);  Surgeon: RRogene Houston MD;  [facility];  Service: Endoscopy;  Laterality: N/A;  730  ? LEA DOPPLER  07/28/2008  ? NORMAL RGT GROIN DUPLEX DOPPLER  ? liver stent    ? LIVER SURGERY  09-03/14  ? @ [facility]  ? Melanoma resection    ? ? ?Current Medications: ?Outpatient Medications Prior to Visit  ?Medication Sig  Dispense Refill  ? amoxicillin-clavulanate (AUGMENTIN) 875-125 MG tablet Take 1 tablet by mouth 2 (two) times daily. For 30 days  - will start Augmentin in May    ? cephALEXin (KEFLEX) 500 MG capsule Take 500 mg by mouth 2 (two) times daily.    ? ciprofloxacin (CIPRO) 500 MG tablet Take 1 tablet by mouth as directed. Patient will take 1 tablet twice a day for 1 month; current regimen is Bactrim - will start Cipro in April    ? cycloSPORINE (RESTASIS) 0.05 % ophthalmic emulsion Place 1 drop into both eyes 2 (two) times daily.     ? finasteride  (PROSCAR) 5 MG tablet Take 5 mg by mouth daily.    ? fluorouracil (EFUDEX) 5 % cream Apply topically daily.    ? metoprolol tartrate (LOPRESSOR) 25 MG tablet TAKE 1/2 TABLET(12.5 MG) BY MOUTH TWICE DAILY 90 tablet 3  ? mirtazapine (REMERON) 7.5 MG tablet Take 7.5 mg by mouth at bedtime.    ? Multiple Vitamin (MULITIVITAMIN WITH MINERALS) TABS Take 1 tablet by mouth daily. Centrum Silver men 50+    ? Multiple Vitamins-Minerals (PRESERVISION AREDS) CAPS Take 1 capsule by mouth in the morning and at bedtime.     ? naproxen sodium (ALEVE) 220 MG tablet Take 220 mg by mouth 2 (two) times daily as needed (pain).     ? nitroGLYCERIN (NITROSTAT) 0.4 MG SL tablet Place 0.4 mg under the tongue every 5 (five) minutes as needed for chest pain.    ? Oxymetazoline HCl (AFRIN NASAL SPRAY NA) Place 2 sprays into the nose 2 (two) times daily as needed.     ? pantoprazole (PROTONIX) 40 MG tablet Take 40 mg by mouth at bedtime.     ? pravastatin (PRAVACHOL) 20 MG tablet TAKE 1 TABLET(20 MG) BY MOUTH DAILY 90 tablet 3  ? Probiotic Product (PROBIOTIC DAILY PO) Take 1 tablet by mouth daily.    ? Sodium Chloride Flush (NORMAL SALINE FLUSH) 0.9 % SOLN Use 10 mLs by Intracatheter route 3 (three) times daily as needed for Line Care. 900 mL 6  ? sodium chloride flush 0.9 % SOLN injection 10 mLs by Intracatheter route 3 (three) times daily 90 mL 6  ? tamsulosin (FLOMAX) 0.4 MG CAPS capsule Take 0.4 mg by mouth in the morning and at bedtime.   3  ? traMADol (ULTRAM) 50 MG tablet Take 1 tablet by mouth every 6 (six) hours as needed.    ? ursodiol (ACTIGALL) 250 MG tablet Take 1 tablet (250 mg total) by mouth in the morning and at bedtime. 120 tablet 5  ? ?No facility-administered medications prior to visit.  ?  ? ?Allergies:   Contrast media [iodinated contrast media], Metrizamide, and Other  ? ?Social History  ? ?Socioeconomic History  ? Marital status: Married  ?  Spouse name: Not on file  ? Number of children: 2  ? Years of education: Not on  file  ? Highest education level: Not on file  ?Occupational History  ?  Employer: RETIRED  ?Tobacco Use  ? Smoking status: Former  ?  Packs/day: 1.50  ?  Years: 50.00  ?  Pack years: 75.00  ?  Types: Cigarettes  ?  Quit date: 05/29/2007  ?  Years since quitting: 14.3  ? Smokeless tobacco: Never  ?Vaping Use  ? Vaping Use: Never used  ?Substance and Sexual Activity  ? Alcohol use: Not Currently  ?  Alcohol/week: 1.0 standard drink  ?  Types: 1  Standard drinks or equivalent per week  ?  Comment: very rare  ? Drug use: No  ? Sexual activity: Not Currently  ?Other Topics Concern  ? Not on file  ?Social History Narrative  ? Married father to come a grandfather 3. 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 Has more fatigue with walking but still able to walk up to 2 miles prior to his most recent hospitalization.  ? ?Social Determinants of Health  ? ?Financial Resource Strain: Not on file  ?Food Insecurity: Not on file  ?Transportation Needs: Not on file  ?Physical Activity: Not on file  ?Stress: Not on file  ?Social Connections: Not on file  ?  ? ?Family History:  The patient's family history includes Colon cancer in his sister; Emphysema in his father; Heart disease in his mother.  ? ?Review of Systems:   ? ?Please see the history of present illness.    ? ?All other systems reviewed and are otherwise negative except as noted above. ? ? ?Physical Exam:   ? ?VS:  BP 126/68   Pulse 76   Ht '5\' 11"'$  (1.803 m)   Wt 185 lb (83.9 kg)   SpO2 96%   BMI 25.80 kg/m?    ?General: Well developed, well nourished,male appearing in no acute distress. ?Head: Normocephalic, atraumatic. ?Neck: No carotid bruits. JVD not elevated.  ?Lungs: Respirations regular and unlabored, without wheezes or rales.  ?Heart: Regular rate and rhythm. No S3 or S4.  No murmur, no rubs, or gallops appreciated. ?Abdomen: Appears non-distended. No obvious abdominal masses. ?Msk:  Strength and tone appear normal for age. No obvious joint deformities or  effusions. ?Extremities: No clubbing or cyanosis. No pitting edema.  Distal pedal pulses are 2+ bilaterally. ?Neuro: Alert and oriented X 3. Moves all extremities spontaneously. No focal deficits noted. ?Psych:  Resp

## 2021-09-15 ENCOUNTER — Ambulatory Visit (INDEPENDENT_AMBULATORY_CARE_PROVIDER_SITE_OTHER): Payer: Medicare Other | Admitting: Student

## 2021-09-15 ENCOUNTER — Encounter: Payer: Self-pay | Admitting: Student

## 2021-09-15 VITALS — BP 126/68 | HR 76 | Ht 71.0 in | Wt 185.0 lb

## 2021-09-15 DIAGNOSIS — I480 Paroxysmal atrial fibrillation: Secondary | ICD-10-CM | POA: Diagnosis not present

## 2021-09-15 DIAGNOSIS — I25119 Atherosclerotic heart disease of native coronary artery with unspecified angina pectoris: Secondary | ICD-10-CM | POA: Diagnosis not present

## 2021-09-15 DIAGNOSIS — D696 Thrombocytopenia, unspecified: Secondary | ICD-10-CM

## 2021-09-15 DIAGNOSIS — I10 Essential (primary) hypertension: Secondary | ICD-10-CM

## 2021-09-15 DIAGNOSIS — R0609 Other forms of dyspnea: Secondary | ICD-10-CM

## 2021-09-15 DIAGNOSIS — E785 Hyperlipidemia, unspecified: Secondary | ICD-10-CM

## 2021-09-15 NOTE — Patient Instructions (Signed)
Medication Instructions:Your physician recommends that you continue on your current medications as directed. Please refer to the Current Medication list given to you today.    Labwork: None today  Testing/Procedures: Your physician has requested that you have a lexiscan myoview. For further information please visit [email or web address]. Please follow instruction sheet, as given.   Follow-Up: 6 months  Any Other Special Instructions Will Be Listed Below (If Applicable).  If you need a refill on your cardiac medications before your next appointment, please call your pharmacy.  

## 2021-09-16 ENCOUNTER — Encounter: Payer: Self-pay | Admitting: Student

## 2021-09-18 DIAGNOSIS — C44321 Squamous cell carcinoma of skin of nose: Secondary | ICD-10-CM | POA: Diagnosis not present

## 2021-10-02 ENCOUNTER — Encounter (HOSPITAL_COMMUNITY)
Admission: RE | Admit: 2021-10-02 | Discharge: 2021-10-02 | Disposition: A | Discharge status: Home / Self Care | Payer: Medicare Other | Source: Ambulatory Visit | Attending: Student | Admitting: Student

## 2021-10-02 ENCOUNTER — Encounter (HOSPITAL_COMMUNITY): Payer: Self-pay

## 2021-10-02 ENCOUNTER — Ambulatory Visit (HOSPITAL_COMMUNITY)
Admission: RE | Admit: 2021-10-02 | Discharge: 2021-10-02 | Disposition: A | Discharge status: Home / Self Care | Payer: Medicare Other | Source: Ambulatory Visit | Attending: Student | Admitting: Student

## 2021-10-02 DIAGNOSIS — I25119 Atherosclerotic heart disease of native coronary artery with unspecified angina pectoris: Secondary | ICD-10-CM | POA: Insufficient documentation

## 2021-10-02 DIAGNOSIS — R0609 Other forms of dyspnea: Secondary | ICD-10-CM | POA: Diagnosis not present

## 2021-10-02 LAB — NM MYOCAR MULTI W/SPECT W/WALL MOTION / EF
LV dias vol: 98 mL (ref 62–150)
LV sys vol: 51 mL
Nuc Stress EF: 48 %
Peak HR: 75 {beats}/min
RATE: 0.7
Rest HR: 63 {beats}/min
Rest Nuclear Isotope Dose: 10.7 mCi
SDS: 1
SRS: 3
SSS: 4
ST Depression (mm): 0 mm
Stress Nuclear Isotope Dose: 31 mCi
TID: 1.12

## 2021-10-02 MED ORDER — TECHNETIUM TC 99M TETROFOSMIN IV KIT
30.0000 | PACK | Freq: Once | INTRAVENOUS | Status: AC | PRN
  Administered 2021-10-02: 31 via INTRAVENOUS

## 2021-10-02 MED ORDER — TECHNETIUM TC 99M TETROFOSMIN IV KIT
10.0000 | PACK | Freq: Once | INTRAVENOUS | Status: AC | PRN
  Administered 2021-10-02: 10.7 via INTRAVENOUS

## 2021-10-02 MED ORDER — SODIUM CHLORIDE FLUSH 0.9 % IV SOLN
INTRAVENOUS | Status: AC
  Administered 2021-10-02: 10 mL via INTRAVENOUS
  Filled 2021-10-02: qty 10

## 2021-10-02 MED ORDER — REGADENOSON 0.4 MG/5ML IV SOLN
INTRAVENOUS | Status: AC
  Administered 2021-10-02: 0.4 mg via INTRAVENOUS
  Filled 2021-10-02: qty 5

## 2021-10-03 ENCOUNTER — Other Ambulatory Visit (HOSPITAL_COMMUNITY): Payer: Self-pay

## 2021-10-04 ENCOUNTER — Other Ambulatory Visit (HOSPITAL_COMMUNITY): Payer: Self-pay

## 2021-10-04 MED ORDER — SODIUM CHLORIDE FLUSH 0.9 % IV SOLN
10.0000 mL | Freq: Three times a day (TID) | INTRAVENOUS | 0 refills | Status: DC
Start: 2021-10-03 — End: 2022-09-24
  Filled 2021-10-04: qty 1800, 60d supply, fill #0
  Filled 2021-12-22 (×2): qty 1800, 60d supply, fill #1
  Filled 2022-03-12: qty 1800, 60d supply, fill #2

## 2021-10-05 ENCOUNTER — Other Ambulatory Visit (HOSPITAL_COMMUNITY): Payer: Self-pay

## 2021-12-19 DIAGNOSIS — K831 Obstruction of bile duct: Secondary | ICD-10-CM | POA: Diagnosis not present

## 2021-12-22 ENCOUNTER — Other Ambulatory Visit (HOSPITAL_COMMUNITY): Payer: Self-pay

## 2022-01-15 ENCOUNTER — Encounter (INDEPENDENT_AMBULATORY_CARE_PROVIDER_SITE_OTHER): Payer: Medicare Other | Admitting: Ophthalmology

## 2022-01-17 DIAGNOSIS — I2510 Atherosclerotic heart disease of native coronary artery without angina pectoris: Secondary | ICD-10-CM | POA: Diagnosis not present

## 2022-01-17 DIAGNOSIS — Z91041 Radiographic dye allergy status: Secondary | ICD-10-CM | POA: Diagnosis not present

## 2022-01-17 DIAGNOSIS — I10 Essential (primary) hypertension: Secondary | ICD-10-CM | POA: Diagnosis not present

## 2022-01-17 DIAGNOSIS — Z79899 Other long term (current) drug therapy: Secondary | ICD-10-CM | POA: Diagnosis not present

## 2022-01-17 DIAGNOSIS — Z86718 Personal history of other venous thrombosis and embolism: Secondary | ICD-10-CM | POA: Diagnosis not present

## 2022-01-17 DIAGNOSIS — Z955 Presence of coronary angioplasty implant and graft: Secondary | ICD-10-CM | POA: Diagnosis not present

## 2022-01-17 DIAGNOSIS — Z888 Allergy status to other drugs, medicaments and biological substances status: Secondary | ICD-10-CM | POA: Diagnosis not present

## 2022-01-17 DIAGNOSIS — K4090 Unilateral inguinal hernia, without obstruction or gangrene, not specified as recurrent: Secondary | ICD-10-CM | POA: Diagnosis not present

## 2022-01-17 DIAGNOSIS — E785 Hyperlipidemia, unspecified: Secondary | ICD-10-CM | POA: Diagnosis not present

## 2022-01-17 DIAGNOSIS — N400 Enlarged prostate without lower urinary tract symptoms: Secondary | ICD-10-CM | POA: Diagnosis not present

## 2022-01-17 DIAGNOSIS — Z9049 Acquired absence of other specified parts of digestive tract: Secondary | ICD-10-CM | POA: Diagnosis not present

## 2022-01-17 DIAGNOSIS — Z87891 Personal history of nicotine dependence: Secondary | ICD-10-CM | POA: Diagnosis not present

## 2022-01-17 DIAGNOSIS — K8309 Other cholangitis: Secondary | ICD-10-CM | POA: Diagnosis not present

## 2022-01-17 DIAGNOSIS — K831 Obstruction of bile duct: Secondary | ICD-10-CM | POA: Diagnosis not present

## 2022-01-17 DIAGNOSIS — I4891 Unspecified atrial fibrillation: Secondary | ICD-10-CM | POA: Diagnosis not present

## 2022-01-17 DIAGNOSIS — G4733 Obstructive sleep apnea (adult) (pediatric): Secondary | ICD-10-CM | POA: Diagnosis not present

## 2022-01-18 DIAGNOSIS — I4891 Unspecified atrial fibrillation: Secondary | ICD-10-CM | POA: Diagnosis not present

## 2022-01-18 DIAGNOSIS — I2510 Atherosclerotic heart disease of native coronary artery without angina pectoris: Secondary | ICD-10-CM | POA: Diagnosis not present

## 2022-01-18 DIAGNOSIS — N400 Enlarged prostate without lower urinary tract symptoms: Secondary | ICD-10-CM | POA: Diagnosis not present

## 2022-01-18 DIAGNOSIS — K8309 Other cholangitis: Secondary | ICD-10-CM | POA: Diagnosis not present

## 2022-01-18 DIAGNOSIS — I10 Essential (primary) hypertension: Secondary | ICD-10-CM | POA: Diagnosis not present

## 2022-01-18 DIAGNOSIS — K831 Obstruction of bile duct: Secondary | ICD-10-CM | POA: Diagnosis not present

## 2022-01-26 DIAGNOSIS — G609 Hereditary and idiopathic neuropathy, unspecified: Secondary | ICD-10-CM | POA: Diagnosis not present

## 2022-01-26 DIAGNOSIS — E782 Mixed hyperlipidemia: Secondary | ICD-10-CM | POA: Diagnosis not present

## 2022-01-30 DIAGNOSIS — D649 Anemia, unspecified: Secondary | ICD-10-CM | POA: Diagnosis not present

## 2022-01-30 DIAGNOSIS — I429 Cardiomyopathy, unspecified: Secondary | ICD-10-CM | POA: Diagnosis not present

## 2022-01-30 DIAGNOSIS — K219 Gastro-esophageal reflux disease without esophagitis: Secondary | ICD-10-CM | POA: Diagnosis not present

## 2022-01-30 DIAGNOSIS — K4090 Unilateral inguinal hernia, without obstruction or gangrene, not specified as recurrent: Secondary | ICD-10-CM | POA: Diagnosis not present

## 2022-01-30 DIAGNOSIS — G609 Hereditary and idiopathic neuropathy, unspecified: Secondary | ICD-10-CM | POA: Diagnosis not present

## 2022-01-30 DIAGNOSIS — N401 Enlarged prostate with lower urinary tract symptoms: Secondary | ICD-10-CM | POA: Diagnosis not present

## 2022-01-30 DIAGNOSIS — L723 Sebaceous cyst: Secondary | ICD-10-CM | POA: Diagnosis not present

## 2022-01-30 DIAGNOSIS — K8309 Other cholangitis: Secondary | ICD-10-CM | POA: Diagnosis not present

## 2022-01-30 DIAGNOSIS — E782 Mixed hyperlipidemia: Secondary | ICD-10-CM | POA: Diagnosis not present

## 2022-01-30 DIAGNOSIS — I2510 Atherosclerotic heart disease of native coronary artery without angina pectoris: Secondary | ICD-10-CM | POA: Diagnosis not present

## 2022-01-30 DIAGNOSIS — D696 Thrombocytopenia, unspecified: Secondary | ICD-10-CM | POA: Diagnosis not present

## 2022-01-30 DIAGNOSIS — I480 Paroxysmal atrial fibrillation: Secondary | ICD-10-CM | POA: Diagnosis not present

## 2022-01-31 ENCOUNTER — Encounter (INDEPENDENT_AMBULATORY_CARE_PROVIDER_SITE_OTHER): Payer: Self-pay | Admitting: Ophthalmology

## 2022-01-31 ENCOUNTER — Ambulatory Visit (INDEPENDENT_AMBULATORY_CARE_PROVIDER_SITE_OTHER): Payer: Medicare Other | Admitting: Ophthalmology

## 2022-01-31 DIAGNOSIS — H43822 Vitreomacular adhesion, left eye: Secondary | ICD-10-CM | POA: Diagnosis not present

## 2022-01-31 DIAGNOSIS — H353132 Nonexudative age-related macular degeneration, bilateral, intermediate dry stage: Secondary | ICD-10-CM

## 2022-01-31 DIAGNOSIS — H43812 Vitreous degeneration, left eye: Secondary | ICD-10-CM | POA: Diagnosis not present

## 2022-01-31 DIAGNOSIS — H43821 Vitreomacular adhesion, right eye: Secondary | ICD-10-CM | POA: Diagnosis not present

## 2022-01-31 NOTE — Assessment & Plan Note (Signed)
Partial OS

## 2022-01-31 NOTE — Progress Notes (Signed)
01/31/2022     CHIEF COMPLAINT Patient presents for  Chief Complaint  Patient presents with   Macular Degeneration      HISTORY OF PRESENT ILLNESS: Alexander Burton is a 81 y.o. male who presents to the clinic today for:   HPI   6 mths dilate ou color fp oct Pt states his vision has been stable Pt denies any new floaters or FOL Last edited by Morene Rankins, CMA on 01/31/2022 11:29 AM.      Referring physician: Celene Squibb, MD [address] [place],  [place] [address]  HISTORICAL INFORMATION:   Selected notes from the MEDICAL RECORD NUMBER       CURRENT MEDICATIONS: Current Outpatient Medications (Ophthalmic Drugs)  Medication Sig   cycloSPORINE (RESTASIS) 0.05 % ophthalmic emulsion Place 1 drop into both eyes 2 (two) times daily.    No current facility-administered medications for this visit. (Ophthalmic Drugs)   Current Outpatient Medications (Other)  Medication Sig   amoxicillin-clavulanate (AUGMENTIN) 875-125 MG tablet Take 1 tablet by mouth 2 (two) times daily. For 30 days  - will start Augmentin in May   cephALEXin (KEFLEX) 500 MG capsule Take 500 mg by mouth 2 (two) times daily.   ciprofloxacin (CIPRO) 500 MG tablet Take 1 tablet by mouth as directed. Patient will take 1 tablet twice a day for 1 month; current regimen is Bactrim - will start Cipro in April   finasteride (PROSCAR) 5 MG tablet Take 5 mg by mouth daily.   fluorouracil (EFUDEX) 5 % cream Apply topically daily.   metoprolol tartrate (LOPRESSOR) 25 MG tablet TAKE 1/2 TABLET(12.5 MG) BY MOUTH TWICE DAILY   mirtazapine (REMERON) 7.5 MG tablet Take 7.5 mg by mouth at bedtime.   Multiple Vitamin (MULITIVITAMIN WITH MINERALS) TABS Take 1 tablet by mouth daily. Centrum Silver men 50+   Multiple Vitamins-Minerals (PRESERVISION AREDS) CAPS Take 1 capsule by mouth in the morning and at bedtime.    naproxen sodium (ALEVE) 220 MG tablet Take 220 mg by mouth 2 (two) times daily as needed (pain).     nitroGLYCERIN (NITROSTAT) 0.4 MG SL tablet Place 0.4 mg under the tongue every 5 (five) minutes as needed for chest pain.   Oxymetazoline HCl (AFRIN NASAL SPRAY NA) Place 2 sprays into the nose 2 (two) times daily as needed.    pantoprazole (PROTONIX) 40 MG tablet Take 40 mg by mouth at bedtime.    pravastatin (PRAVACHOL) 20 MG tablet TAKE 1 TABLET(20 MG) BY MOUTH DAILY   Probiotic Product (PROBIOTIC DAILY PO) Take 1 tablet by mouth daily.   Sodium Chloride Flush (NORMAL SALINE FLUSH) 0.9 % SOLN Use 10 mLs by Intracatheter route 3 (three) times daily as needed for Line Care.   sodium chloride flush 0.9 % SOLN injection 10 mLs by Intracatheter route 3 (three) times daily   sodium chloride flush 0.9 % SOLN injection Use 10 mLs by Intracatheter route 3 (three) times daily.   tamsulosin (FLOMAX) 0.4 MG CAPS capsule Take 0.4 mg by mouth in the morning and at bedtime.    traMADol (ULTRAM) 50 MG tablet Take 1 tablet by mouth every 6 (six) hours as needed.   ursodiol (ACTIGALL) 250 MG tablet Take 1 tablet (250 mg total) by mouth in the morning and at bedtime.   No current facility-administered medications for this visit. (Other)      REVIEW OF SYSTEMS: ROS   Negative for: Constitutional, Gastrointestinal, Neurological, Skin, Genitourinary, Musculoskeletal, HENT, Endocrine, Cardiovascular,  Eyes, Respiratory, Psychiatric, Allergic/Imm, Heme/Lymph Last edited by Morene Rankins, CMA on 01/31/2022 11:29 AM.       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 Has now had multiple issues with chronic hepatic abscess. Currently has indwelling drain   COLONOSCOPY  08/16/2011   Procedure: COLONOSCOPY;  Surgeon: Rogene Houston, MD;  [facility];  Service: Endoscopy;  Laterality: N/A;  1200   ESOPHAGEAL DILATION N/A 01/20/2015   Procedure: ESOPHAGEAL DILATION;  Surgeon: Rogene Houston, MD;  [facility];  Service: Endoscopy;  Laterality: N/A;   ESOPHAGEAL DILATION N/A 02/11/2020   Procedure: ESOPHAGEAL DILATION;  Surgeon: Rogene Houston, MD;  [facility];  Service: Endoscopy;  Laterality: N/A;   ESOPHAGOGASTRODUODENOSCOPY N/A 01/20/2015   Procedure: ESOPHAGOGASTRODUODENOSCOPY (EGD);  Surgeon: Rogene Houston, MD;  [facility];  Service: Endoscopy;  Laterality: N/A;  1200   ESOPHAGOGASTRODUODENOSCOPY N/A 02/11/2020   Procedure: ESOPHAGOGASTRODUODENOSCOPY (EGD);  Surgeon: Rogene Houston, MD;  [facility];  Service: Endoscopy;  Laterality: N/A;  730   LEA DOPPLER  07/28/2008   NORMAL RGT GROIN DUPLEX DOPPLER   liver stent     LIVER SURGERY  09-03/14   @ [facility]   Melanoma resection      FAMILY HISTORY Family History  Problem Relation Age of Onset   Colon cancer Sister    Heart disease Mother    Emphysema Father     SOCIAL HISTORY Social History   Tobacco Use   Smoking status: Former    Packs/day: 1.50     Years: 50.00    Total pack years: 75.00    Types: Cigarettes    Quit date: 05/29/2007    Years since quitting: 14.6   Smokeless tobacco: Never  Vaping Use   Vaping Use: Never used  Substance Use Topics   Alcohol use: Not Currently    Alcohol/week: 1.0 standard drink of alcohol    Types: 1 Standard drinks or equivalent per week    Comment: very rare   Drug use: No         OPHTHALMIC EXAM:  Base Eye Exam     Visual Acuity (ETDRS)       Right Left   Dist [place] 20/25 +3  20/30 +1         Tonometry (Tonopen, 11:33 AM)       Right Left   Pressure 10 9         Pupils       Pupils   Right PERRL   Left PERRL         Visual Fields       Left Right    Full Full         Extraocular Movement       Right Left    Ortho Ortho    -- -- --  --  --  -- -- --   -- -- --  --  --  -- -- --           Neuro/Psych     Oriented x3: Yes   Mood/Affect: Normal         Dilation     Both eyes: 1.0% Mydriacyl, 2.5% Phenylephrine @ 11:30 AM           Slit Lamp and Fundus Exam     External Exam       Right Left   External Normal Normal         Slit Lamp Exam       Right Left   Lids/Lashes Normal Normal   Conjunctiva/Sclera White and quiet White and quiet   Cornea Clear Clear   Anterior Chamber Deep and quiet Deep and quiet   Iris Round and reactive Round and reactive   Lens Centered posterior chamber intraocular lens Centered posterior chamber intraocular lens   Anterior Vitreous Normal Normal         Fundus Exam       Right Left   Posterior Vitreous Normal Posterior vitreous detachment   Disc Normal Normal   C/D Ratio 0.5 0.5   Macula Drusen, Intermediate age related macular degeneration no exudates, no hemorrhage   Vessels Normal Normal   Periphery Normal Normal            IMAGING AND PROCEDURES  Imaging and Procedures for 01/31/22  OCT, Retina - OU - Both Eyes       Right Eye Quality was good. Scan locations included  subfoveal. Central Foveal Thickness: 267. Progression has improved. Findings include no IRF, no SRF, retinal drusen , vitreomacular adhesion .   Left Eye Quality was good. Scan locations included subfoveal. Central Foveal Thickness: 257. Progression has been stable. Findings include no IRF, no SRF, retinal drusen .   Notes OD with persistent PVD with subfoveal drusenoid findings And almost none other seen throughout the macula on clinical examination. OS with near complete resolution of drusenoid findings in the subfoveal location coincident With less tangential traction from VMA also with institution of oral vitamin therapy in 2017     Color Fundus Photography Optos - OU - Both Eyes       Right Eye Progression has been stable. Disc findings include normal observations. Macula : drusen. Vessels : normal observations. Periphery : normal observations.   Left Eye Progression has been stable. Disc findings include normal observations. Macula : drusen. Vessels : normal observations. Periphery : normal observations.   Notes With clear media             ASSESSMENT/PLAN:  Intermediate stage nonexudative age-related macular degeneration of both eyes The nature of age--related macular degeneration was discussed with the patient as well as the distinction between dry and wet types. Checking an Amsler Grid daily with  advice to return immediately should a distortion develop, was given to the patient. The patient 's smoking status now and in the past was determined and advice based on the AREDS study was provided regarding the consumption of antioxidant supplements. AREDS 2 vitamin formulation was recommended. Consumption of dark leafy vegetables and fresh fruits of various colors was recommended. Treatment modalities for wet macular degeneration particularly the use of intravitreal injections of anti-blood vessel growth factors was discussed with the patient. Avastin, Lucentis, and Eylea are the  available options. On occasion, therapy includes the use of photodynamic therapy and thermal laser. Stressed to the patient do not rub eyes.  Patient was advised to check Amsler Grid daily and return immediately if changes are noted. Instructions on using the grid were given to the patient. All patient questions were answered.  Posterior vitreous detachment of left eye Partial OS  Vitreomacular adhesion of left eye No foveal distortion  Vitreomacular adhesion of right eye No foveal distortion     ICD-10-CM   1. Vitreomacular adhesion of left eye  H43.822 OCT, Retina - OU - Both Eyes    Color Fundus Photography Optos - OU - Both Eyes    2. Intermediate stage nonexudative age-related macular degeneration of both eyes  H35.3132     3. Posterior vitreous detachment of left eye  H43.812     4. Vitreomacular adhesion of right eye  H43.821       1.  Patient instructed to report to the office promptly new onset vision loss distortion of the vision  2.  Patient to continue on oral vitamin therapy, AREDS 2 3.  Ophthalmic Meds Ordered this visit:  No orders of the defined types were placed in this encounter.      Return in about 1 year (around 02/01/2023) for DILATE OU, COLOR FP, OCT.  There are no Patient Instructions on file for this visit.   Explained the diagnoses, plan, and follow up with the patient and they expressed understanding.  Patient expressed understanding of the importance of proper follow up care.   Clent Demark Kilian Schwartz M.D. Diseases & Surgery of the Retina and Vitreous [facility] 01/31/22     Abbreviations: M myopia (nearsighted); A astigmatism; H hyperopia (farsighted); P presbyopia; Mrx spectacle prescription;  CTL contact lenses; OD right eye; OS left eye; OU both eyes  XT exotropia; ET esotropia; PEK punctate epithelial keratitis; PEE punctate epithelial erosions; DES dry eye syndrome; MGD meibomian gland dysfunction; ATs artificial tears; PFAT's  preservative free artificial tears; [place] nuclear sclerotic cataract; PSC posterior subcapsular cataract; ERM epi-retinal membrane; PVD posterior vitreous detachment; RD retinal detachment; DM diabetes mellitus; DR diabetic retinopathy; NPDR non-proliferative diabetic retinopathy; PDR proliferative diabetic retinopathy; CSME clinically significant macular edema; DME diabetic macular edema; dbh dot blot hemorrhages; CWS cotton wool spot; POAG primary open angle glaucoma; C/D cup-to-disc ratio; HVF humphrey visual field; GVF goldmann visual field; OCT optical coherence tomography; IOP intraocular pressure; BRVO Branch retinal vein occlusion; CRVO central retinal vein occlusion; CRAO central retinal artery occlusion; BRAO branch retinal artery occlusion; RT retinal tear; SB scleral buckle; PPV pars plana vitrectomy; VH Vitreous hemorrhage; PRP panretinal laser photocoagulation; IVK intravitreal kenalog; VMT vitreomacular traction; MH Macular hole;  NVD neovascularization of the disc; NVE neovascularization elsewhere; AREDS age related eye disease study; ARMD age related macular degeneration; POAG primary open angle glaucoma; EBMD epithelial/anterior basement membrane dystrophy; ACIOL anterior chamber intraocular lens; IOL intraocular lens; PCIOL posterior chamber intraocular lens; Phaco/IOL phacoemulsification with intraocular  lens placement; [place] photorefractive keratectomy; LASIK laser assisted in situ keratomileusis; HTN hypertension; DM diabetes mellitus; COPD chronic obstructive pulmonary disease

## 2022-01-31 NOTE — Assessment & Plan Note (Signed)

## 2022-01-31 NOTE — Assessment & Plan Note (Signed)
No foveal distortion

## 2022-02-08 DIAGNOSIS — K831 Obstruction of bile duct: Secondary | ICD-10-CM | POA: Diagnosis not present

## 2022-02-08 DIAGNOSIS — R300 Dysuria: Secondary | ICD-10-CM | POA: Diagnosis not present

## 2022-02-28 DIAGNOSIS — Z23 Encounter for immunization: Secondary | ICD-10-CM | POA: Diagnosis not present

## 2022-03-09 DIAGNOSIS — Z0000 Encounter for general adult medical examination without abnormal findings: Secondary | ICD-10-CM | POA: Diagnosis not present

## 2022-03-12 ENCOUNTER — Other Ambulatory Visit (HOSPITAL_COMMUNITY): Payer: Self-pay

## 2022-03-13 ENCOUNTER — Other Ambulatory Visit (HOSPITAL_COMMUNITY): Payer: Self-pay

## 2022-03-15 DIAGNOSIS — N401 Enlarged prostate with lower urinary tract symptoms: Secondary | ICD-10-CM | POA: Diagnosis not present

## 2022-03-15 DIAGNOSIS — R39198 Other difficulties with micturition: Secondary | ICD-10-CM | POA: Diagnosis not present

## 2022-03-15 DIAGNOSIS — R972 Elevated prostate specific antigen [PSA]: Secondary | ICD-10-CM | POA: Diagnosis not present

## 2022-03-15 DIAGNOSIS — N528 Other male erectile dysfunction: Secondary | ICD-10-CM | POA: Diagnosis not present

## 2022-03-21 IMAGING — CT CT BIOPSY
1 of 2 series · 15 of 30 positions shown, 19 images · non-contrast
Comparison: none

INDICATION: Monoclonal gammopathy of unknown significance.

[Series 2: i-spiral 5.0 br59 · axial · 0.80mm/px · z∈[-396,-270]mm · 15 of 40 slices shown, 19 images]
[im 2/40  mediastinal]
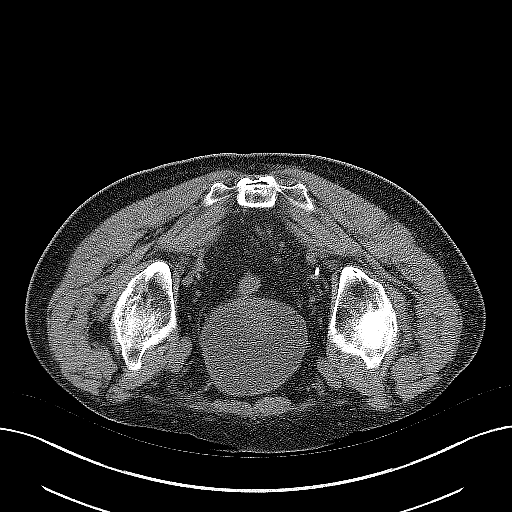
[im 2/40  lung]
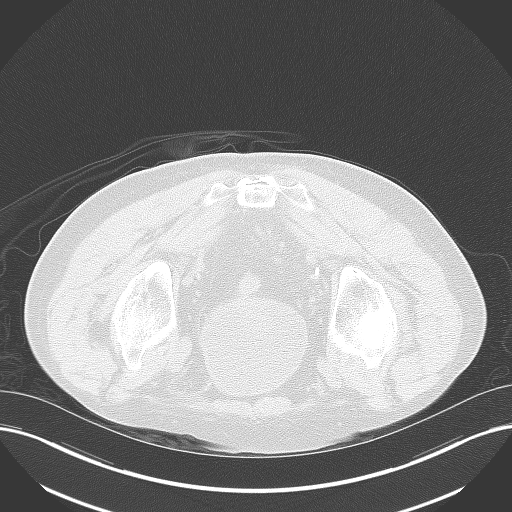
[im 6/40  lung]
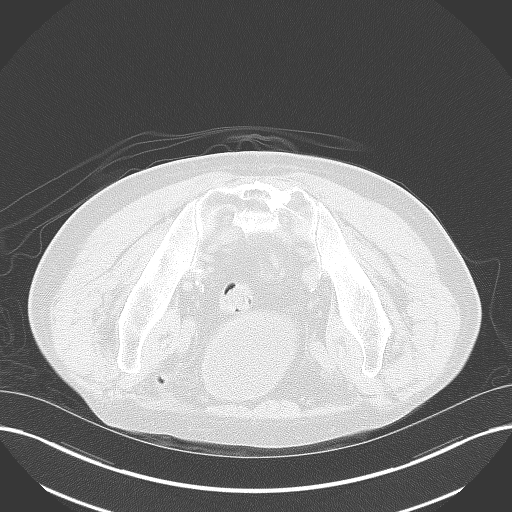
[im 8/40  lung]
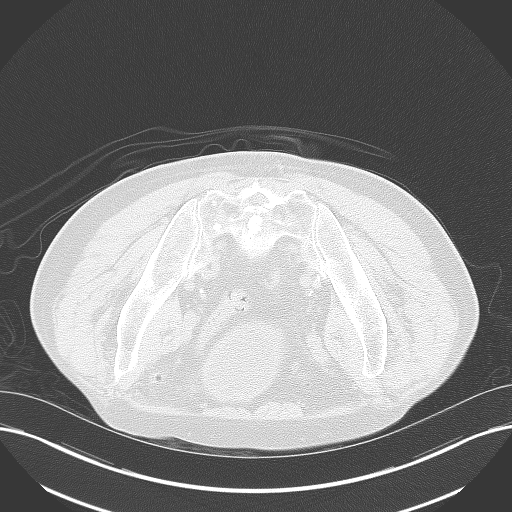
[im 10/40  lung]
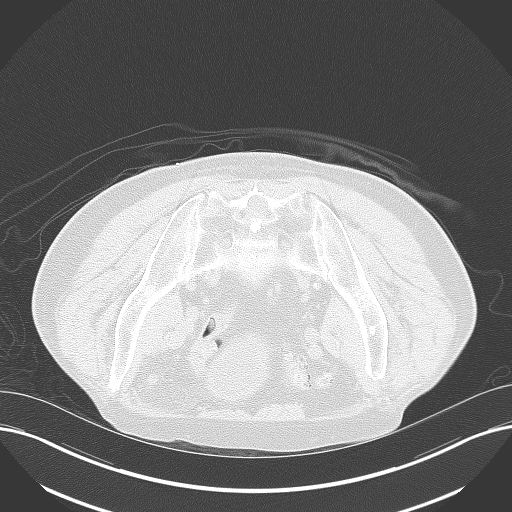
[im 11/40  mediastinal]
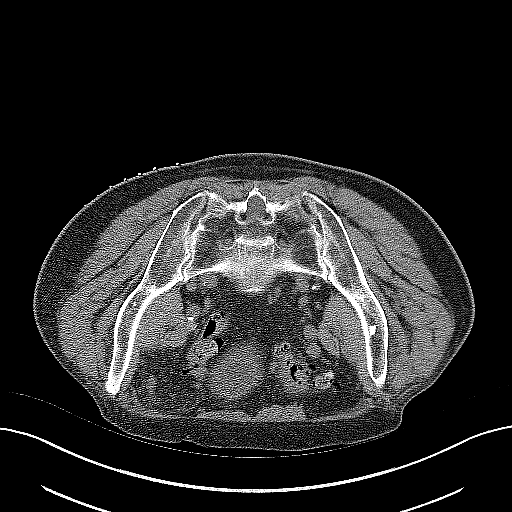
[im 11/40  lung]
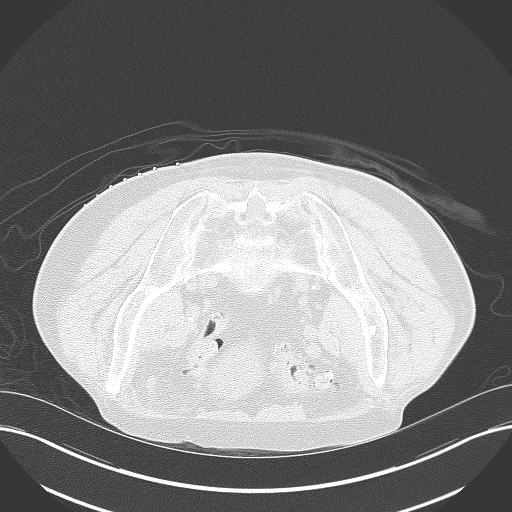
[im 15/40  lung]
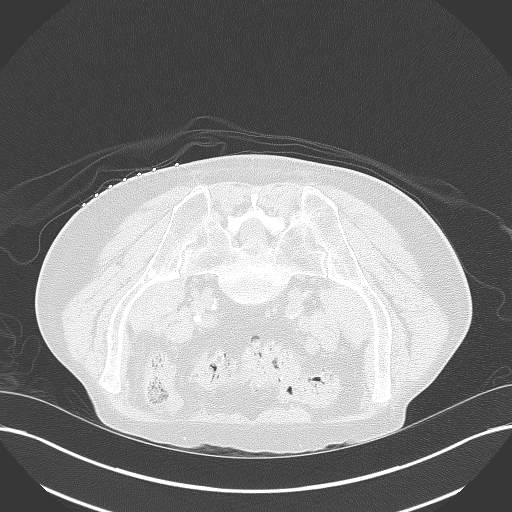
[im 16/40  lung]
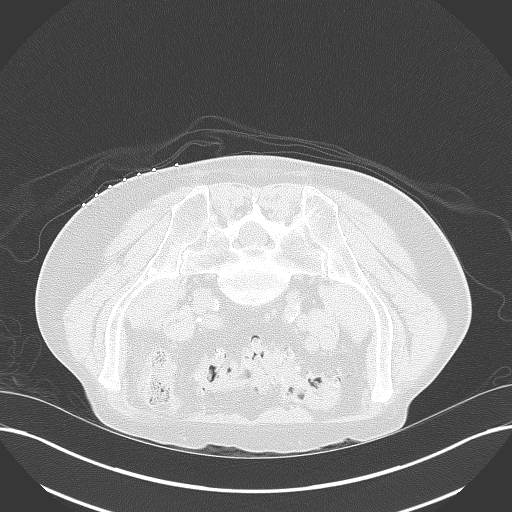
[im 20/40  lung]
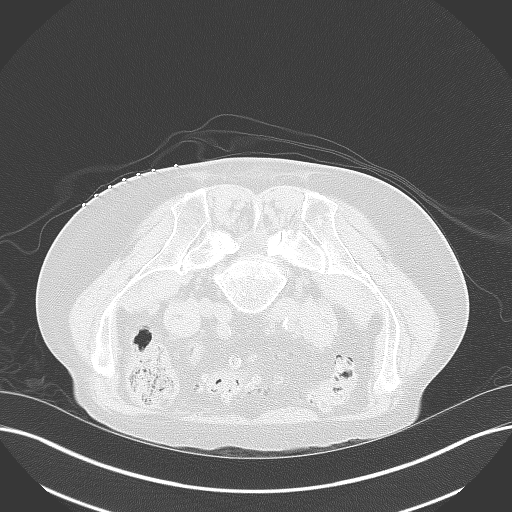
[im 24/40  mediastinal]
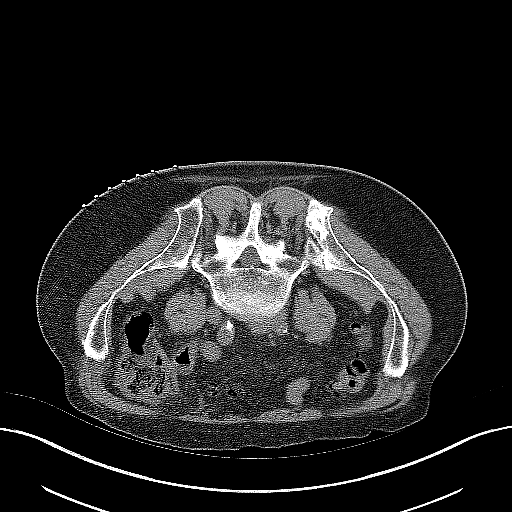
[im 24/40  lung]
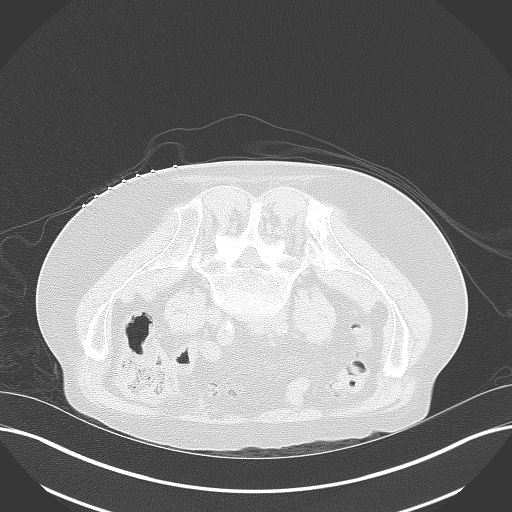
[im 25/40  lung]
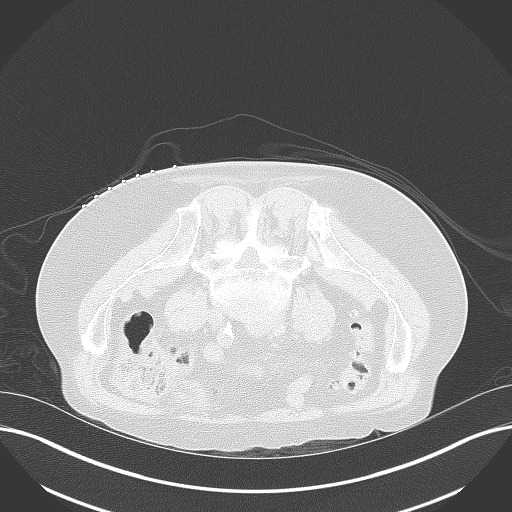
[im 29/40  lung]
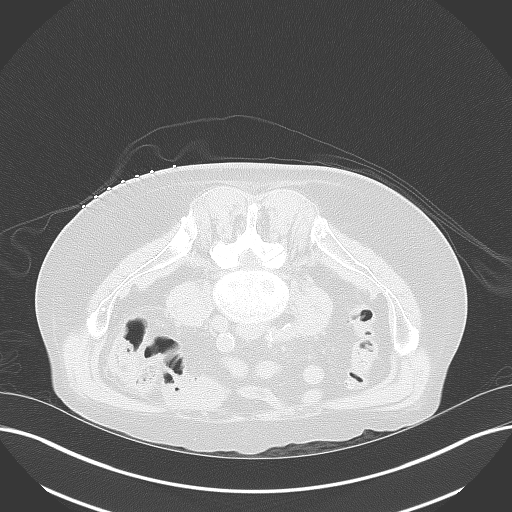
[im 30/40  lung]
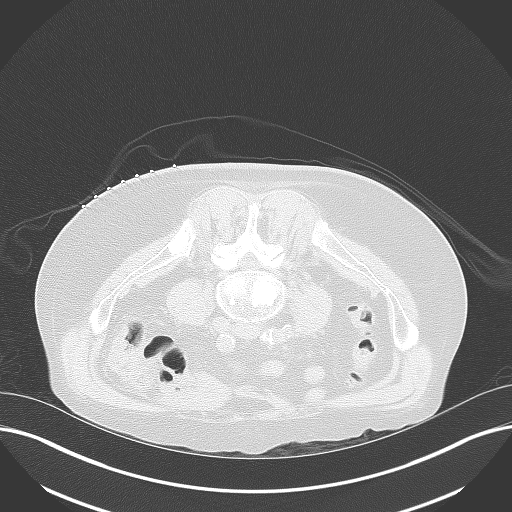
[im 32/40  mediastinal]
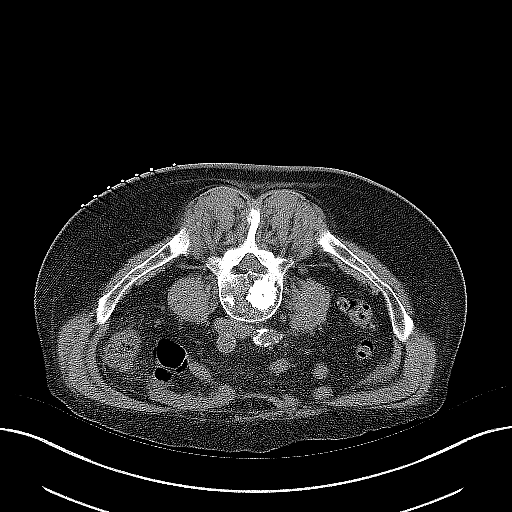
[im 32/40  lung]
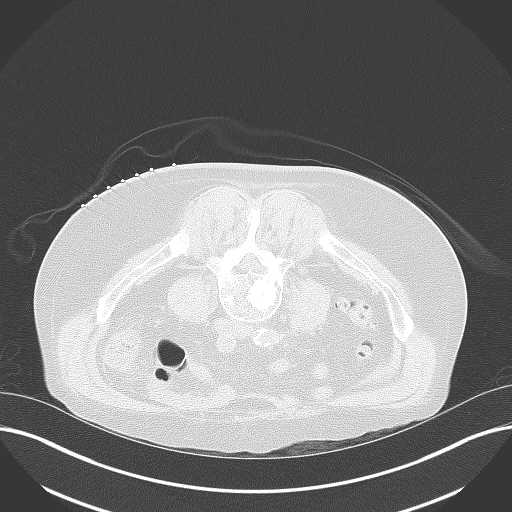
[im 34/40  lung]
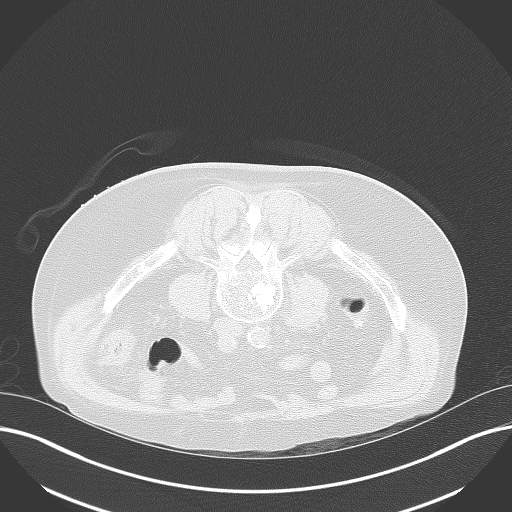
[im 38/40  lung]
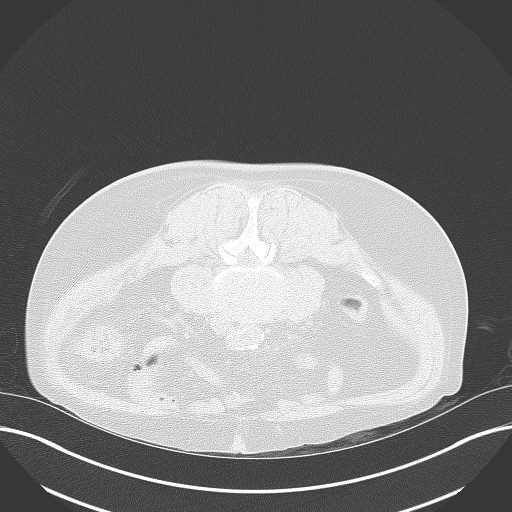

[15 of 30 positions shown; findings below may reference images not displayed]

EXAM:
CT GUIDED BONE MARROW ASPIRATES AND BIOPSY

MEDICATIONS:
None.

ANESTHESIA/SEDATION:
Fentanyl 100 mcg IV; Versed 2.0 mg IV

Moderate Sedation Time:  10 minutes

The patient was continuously monitored during the procedure by the
interventional radiology nurse under my direct supervision.

COMPLICATIONS:
None immediate.

PROCEDURE:
The procedure was explained to the patient. The risks and benefits
of the procedure were discussed and the patient's questions were
addressed. Informed consent was obtained from the patient. The
patient was placed prone on CT table. Images of the pelvis were
obtained. The right side of back was prepped and draped in sterile
fashion. The skin and right posterior ilium were anesthetized with
1% lidocaine. 11 gauge bone needle was directed into the right ilium
with CT guidance. Two aspirates and one core biopsy were obtained.
Bandage placed over the puncture site.
FINDINGS: Patient has multiple sclerotic lesions in the bones and this is
similar to a CT from 07/29/2013. Sclerotic lesions could represent
multiple bone islands. Atherosclerotic calcifications involving the
abdominal aorta and iliac arteries. Bone needle was directed in the
posterior right ilium.
IMPRESSION: CT guided bone marrow aspiration and core biopsy.

## 2022-04-04 NOTE — Progress Notes (Unsigned)
Cardiology Office Note  Date: 04/05/2022   ID: [name], [name] 29-Oct-1940, MRN [id number]  PCP:  Celene Squibb, MD  Cardiologist:  Rozann Lesches, MD Electrophysiologist:  None   Chief Complaint  Patient presents with   Cardiac follow-up    History of Present Illness: Alexander Burton is an 81 y.o. male last seen in April by Ms. Strader PA-C, I reviewed the note.  He is here with his wife for a routine visit.  Reports more dyspnea on exertion over the last month, no obvious major change in his health otherwise.  He did have lab work in September with Dr. Nevada Crane, we are requesting these results.  I personally reviewed his ECG today which shows atrial fibrillation in the high 90s with occasional PVCs, nonspecific ST-T changes.  We discussed increasing his Lopressor to 25 mg twice daily to see if this helps to improve his shortness of breath. We will also obtain an echocardiogram.  Ischemic testing from earlier in the year was overall reassuring.  He is not anticoagulated given increased bleeding risk with chronic thrombocytopenia.  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 Has now had multiple issues with  chronic hepatic abscess. Currently has indwelling drain   COLONOSCOPY  08/16/2011   Procedure: COLONOSCOPY;  Surgeon: Rogene Houston, MD;  [facility];  Service: Endoscopy;  Laterality: N/A;  1200   ESOPHAGEAL DILATION N/A 01/20/2015   Procedure: ESOPHAGEAL DILATION;  Surgeon: Rogene Houston, MD;  [facility];  Service: Endoscopy;  Laterality: N/A;   ESOPHAGEAL DILATION N/A 02/11/2020   Procedure: ESOPHAGEAL DILATION;  Surgeon: Rogene Houston, MD;  [facility];  Service: Endoscopy;  Laterality: N/A;   ESOPHAGOGASTRODUODENOSCOPY N/A 01/20/2015   Procedure: ESOPHAGOGASTRODUODENOSCOPY (EGD);  Surgeon: Rogene Houston, MD;  [facility];  Service: Endoscopy;  Laterality: N/A;  1200   ESOPHAGOGASTRODUODENOSCOPY N/A 02/11/2020   Procedure: ESOPHAGOGASTRODUODENOSCOPY (EGD);  Surgeon: Rogene Houston, MD;  [facility];  Service: Endoscopy;  Laterality: N/A;  730   LEA DOPPLER  07/28/2008   NORMAL RGT GROIN DUPLEX DOPPLER   liver stent     LIVER SURGERY  09-03/14   @ [facility]   Melanoma resection      Current Outpatient Medications  Medication Sig Dispense Refill   ciprofloxacin (CIPRO) 500 MG tablet Take 1 tablet by mouth as directed. Patient will take 1 tablet twice a day for 1 month; current regimen is Bactrim - will start Cipro in April     cycloSPORINE (RESTASIS) 0.05 %  ophthalmic emulsion Place 1 drop into both eyes 2 (two) times daily.      finasteride (PROSCAR) 5 MG tablet Take 5 mg by mouth daily.     fluorouracil (EFUDEX) 5 % cream Apply topically daily.     metoprolol tartrate (LOPRESSOR) 25 MG tablet Take 1 tablet (25 mg total) by mouth 2 (two) times daily. 180 tablet 3   mirtazapine (REMERON) 7.5 MG tablet Take 7.5 mg by mouth at bedtime.     Multiple Vitamin (MULITIVITAMIN WITH MINERALS) TABS Take 1 tablet by mouth daily. Centrum Silver men 50+     Multiple Vitamins-Minerals (PRESERVISION AREDS) CAPS Take 1 capsule by mouth in  the morning and at bedtime.      naproxen sodium (ALEVE) 220 MG tablet Take 220 mg by mouth 2 (two) times daily as needed (pain).      nitroGLYCERIN (NITROSTAT) 0.4 MG SL tablet Place 0.4 mg under the tongue every 5 (five) minutes as needed for chest pain.     Oxymetazoline HCl (AFRIN NASAL SPRAY NA) Place 2 sprays into the nose 2 (two) times daily as needed.      pantoprazole (PROTONIX) 40 MG tablet Take 40 mg by mouth at bedtime.      pravastatin (PRAVACHOL) 20 MG tablet TAKE 1 TABLET(20 MG) BY MOUTH DAILY 90 tablet 3   Probiotic Product (PROBIOTIC DAILY PO) Take 1 tablet by mouth daily.     Sodium Chloride Flush (NORMAL SALINE FLUSH) 0.9 % SOLN Use 10 mLs by Intracatheter route 3 (three) times daily as needed for Line Care. 900 mL 6   sodium chloride flush 0.9 % SOLN injection 10 mLs by Intracatheter route 3 (three) times daily 90 mL 6   sodium chloride flush 0.9 % SOLN injection Use 10 mLs by Intracatheter route 3 (three) times daily. 5400 mL 0   tamsulosin (FLOMAX) 0.4 MG CAPS capsule Take 0.4 mg by mouth in the morning and at bedtime.   3   traMADol (ULTRAM) 50 MG tablet Take 1 tablet by mouth every 6 (six) hours as needed.     ursodiol (ACTIGALL) 250 MG tablet Take 1 tablet (250 mg total) by mouth in the morning and at bedtime. 120 tablet 5   No current facility-administered medications for this visit.   Allergies:  Contrast media [iodinated contrast media], Metrizamide, and Other   ROS: Hearing loss.  No syncope.  Physical Exam: VS:  BP 128/76   Pulse 96   Ht '5\' 10"'$  (1.778 m)   Wt 184 lb (83.5 kg)   SpO2 97%   BMI 26.40 kg/m , BMI Body mass index is 26.4 kg/m.  Wt Readings from Last 3 Encounters:  04/05/22 184 lb (83.5 kg)  09/15/21 185 lb (83.9 kg)  07/11/21 186 lb 3.2 oz (84.5 kg)    General: Patient appears comfortable at rest. HEENT: Conjunctiva and lids normal. Neck: Supple, no elevated JVP or carotid bruits. Lungs: Clear to auscultation, nonlabored breathing at  rest. Cardiac: Irregularly irregular without gallop. Extremities: No pitting edema,.  ECG:  An ECG dated 02/14/2021 was personally reviewed today and demonstrated:  Sinus rhythm.  Recent Labwork:  August 2023: Hemoglobin 11.4, platelets 83, potassium 4.4, BUN 18, creatinine 1.1, magnesium 2.1  Other Studies Reviewed Today:  Lexiscan Myoview 10/02/2021:   LV perfusion is normal. There is no evidence of ischemia. There is no evidence of infarction. Reduced counts in the inferior segments with normal wall motion consistent with diaphragm attenuation.   The study is normal.  The study is low risk.   Left ventricular function is normal. Nuclear stress EF: 48 %. The left ventricular ejection fraction is mildly decreased (45-54%). End diastolic cavity size is normal. Normal LVEF for this modality.   Prior study available for comparison from 04/19/2015. No changes compared to prior study.  Assessment and Plan:  1.  Persistent atrial fibrillation with CHA2DS2-VASc score of 3.  He is in atrial fibrillation today, likely correlating with shortness of breath.  Not anticoagulated given concerns for bleeding risk with chronic thrombocytopenia, so cardioversion not a possibility.  Increase Lopressor to 25 mg twice daily.  Also obtain echocardiogram to ensure no decrease in LVEF.  Obtain interval lab work from PCP.  2.  CAD status post DES to the LAD in 2010.  Myoview from May of this year was low risk, no ischemic territories.  He remains on Pravachol.  Medication Adjustments/Labs and Tests Ordered: Current medicines are reviewed at length with the patient today.  Concerns regarding medicines are outlined above.   Tests Ordered: Orders Placed This Encounter  Procedures   EKG 12-Lead   ECHOCARDIOGRAM COMPLETE    Medication Changes: Meds ordered this encounter  Medications   metoprolol tartrate (LOPRESSOR) 25 MG tablet    Sig: Take 1 tablet (25 mg total) by mouth 2 (two) times daily.    Dispense:   180 tablet    Refill:  3    04/05/22 increased to 25 mg bid    Disposition:  Follow up  6 months.  Signed, Satira Sark, MD, [facility] 04/05/2022 10:46 AM    [facility] [place]. [address] [place], [place] [address] Phone: [phone]; Fax: [phone]

## 2022-04-05 ENCOUNTER — Encounter: Payer: Self-pay | Admitting: Internal Medicine

## 2022-04-05 ENCOUNTER — Ambulatory Visit: Payer: Medicare Other | Admitting: Cardiology

## 2022-04-05 ENCOUNTER — Encounter: Payer: Self-pay | Admitting: Cardiology

## 2022-04-05 ENCOUNTER — Ambulatory Visit: Payer: Medicare Other | Attending: Cardiology | Admitting: Cardiology

## 2022-04-05 VITALS — BP 128/76 | HR 96 | Ht 70.0 in | Wt 184.0 lb

## 2022-04-05 DIAGNOSIS — I480 Paroxysmal atrial fibrillation: Secondary | ICD-10-CM | POA: Diagnosis not present

## 2022-04-05 DIAGNOSIS — I25119 Atherosclerotic heart disease of native coronary artery with unspecified angina pectoris: Secondary | ICD-10-CM | POA: Diagnosis not present

## 2022-04-05 DIAGNOSIS — R0602 Shortness of breath: Secondary | ICD-10-CM | POA: Insufficient documentation

## 2022-04-05 MED ORDER — METOPROLOL TARTRATE 25 MG PO TABS: 25.0000 mg | ORAL_TABLET | Freq: Two times a day (BID) | ORAL | 3 refills | Status: DC

## 2022-04-05 NOTE — Patient Instructions (Signed)
Medication Instructions:  INCREASE Lopressor to 25 mg twice a day   Labwork: None today  Testing/Procedures: Your physician has requested that you have an echocardiogram. Echocardiography is a painless test that uses sound waves to create images of your heart. It provides your doctor with information about the size and shape of your heart and how well your heart's chambers and valves are working. This procedure takes approximately one hour. There are no restrictions for this procedure. Please do NOT wear cologne, perfume, aftershave, or lotions (deodorant is allowed). Please arrive 15 minutes prior to your appointment time.   Follow-Up: 6 months  Any Other Special Instructions Will Be Listed Below (If Applicable).  If you need a refill on your cardiac medications before your next appointment, please call your pharmacy.

## 2022-04-12 DIAGNOSIS — D225 Melanocytic nevi of trunk: Secondary | ICD-10-CM | POA: Diagnosis not present

## 2022-04-12 DIAGNOSIS — X32XXXD Exposure to sunlight, subsequent encounter: Secondary | ICD-10-CM | POA: Diagnosis not present

## 2022-04-12 DIAGNOSIS — Z1283 Encounter for screening for malignant neoplasm of skin: Secondary | ICD-10-CM | POA: Diagnosis not present

## 2022-04-12 DIAGNOSIS — Z85820 Personal history of malignant melanoma of skin: Secondary | ICD-10-CM | POA: Diagnosis not present

## 2022-04-12 DIAGNOSIS — L570 Actinic keratosis: Secondary | ICD-10-CM | POA: Diagnosis not present

## 2022-04-12 DIAGNOSIS — L821 Other seborrheic keratosis: Secondary | ICD-10-CM | POA: Diagnosis not present

## 2022-04-12 DIAGNOSIS — Z08 Encounter for follow-up examination after completed treatment for malignant neoplasm: Secondary | ICD-10-CM | POA: Diagnosis not present

## 2022-04-12 DIAGNOSIS — D485 Neoplasm of uncertain behavior of skin: Secondary | ICD-10-CM | POA: Diagnosis not present

## 2022-04-12 DIAGNOSIS — L820 Inflamed seborrheic keratosis: Secondary | ICD-10-CM | POA: Diagnosis not present

## 2022-04-17 ENCOUNTER — Ambulatory Visit (HOSPITAL_COMMUNITY)
Admission: RE | Admit: 2022-04-17 | Discharge: 2022-04-17 | Disposition: A | Discharge status: Home / Self Care | Payer: Medicare Other | Source: Ambulatory Visit | Attending: Cardiology | Admitting: Cardiology

## 2022-04-17 DIAGNOSIS — R0602 Shortness of breath: Secondary | ICD-10-CM | POA: Diagnosis not present

## 2022-04-17 LAB — ECHOCARDIOGRAM COMPLETE
Area-P 1/2: 7.44 cm2
S' Lateral: 4.1 cm

## 2022-04-17 NOTE — Progress Notes (Signed)
*  PRELIMINARY RESULTS* Echocardiogram 2D Echocardiogram has been performed.  Alexander Burton 04/17/2022, 11:52 AM

## 2022-04-24 ENCOUNTER — Other Ambulatory Visit (HOSPITAL_COMMUNITY): Payer: Self-pay

## 2022-04-24 MED ORDER — SODIUM CHLORIDE FLUSH 0.9 % IV SOLN
10.0000 mL | Freq: Three times a day (TID) | INTRAVENOUS | 6 refills | Status: DC
  Filled 2022-05-04: qty 900, 30d supply, fill #0
  Filled 2022-06-04: qty 900, 30d supply, fill #1

## 2022-05-03 DIAGNOSIS — R600 Localized edema: Secondary | ICD-10-CM | POA: Diagnosis not present

## 2022-05-03 DIAGNOSIS — R630 Anorexia: Secondary | ICD-10-CM | POA: Diagnosis not present

## 2022-05-03 DIAGNOSIS — I5022 Chronic systolic (congestive) heart failure: Secondary | ICD-10-CM | POA: Diagnosis not present

## 2022-05-03 DIAGNOSIS — I10 Essential (primary) hypertension: Secondary | ICD-10-CM | POA: Diagnosis not present

## 2022-05-03 DIAGNOSIS — I480 Paroxysmal atrial fibrillation: Secondary | ICD-10-CM | POA: Diagnosis not present

## 2022-05-03 DIAGNOSIS — N401 Enlarged prostate with lower urinary tract symptoms: Secondary | ICD-10-CM | POA: Diagnosis not present

## 2022-05-03 DIAGNOSIS — I2510 Atherosclerotic heart disease of native coronary artery without angina pectoris: Secondary | ICD-10-CM | POA: Diagnosis not present

## 2022-05-03 DIAGNOSIS — I429 Cardiomyopathy, unspecified: Secondary | ICD-10-CM | POA: Diagnosis not present

## 2022-05-03 DIAGNOSIS — R0600 Dyspnea, unspecified: Secondary | ICD-10-CM | POA: Diagnosis not present

## 2022-05-04 ENCOUNTER — Other Ambulatory Visit (HOSPITAL_COMMUNITY): Payer: Self-pay

## 2022-05-09 ENCOUNTER — Encounter: Payer: Self-pay | Admitting: Internal Medicine

## 2022-05-09 DIAGNOSIS — I480 Paroxysmal atrial fibrillation: Secondary | ICD-10-CM | POA: Diagnosis not present

## 2022-05-09 DIAGNOSIS — I10 Essential (primary) hypertension: Secondary | ICD-10-CM | POA: Diagnosis not present

## 2022-05-09 DIAGNOSIS — R0600 Dyspnea, unspecified: Secondary | ICD-10-CM | POA: Diagnosis not present

## 2022-05-09 DIAGNOSIS — H02834 Dermatochalasis of left upper eyelid: Secondary | ICD-10-CM | POA: Diagnosis not present

## 2022-05-09 DIAGNOSIS — I5022 Chronic systolic (congestive) heart failure: Secondary | ICD-10-CM | POA: Diagnosis not present

## 2022-05-09 DIAGNOSIS — I429 Cardiomyopathy, unspecified: Secondary | ICD-10-CM | POA: Diagnosis not present

## 2022-05-09 DIAGNOSIS — R630 Anorexia: Secondary | ICD-10-CM | POA: Diagnosis not present

## 2022-05-09 DIAGNOSIS — R600 Localized edema: Secondary | ICD-10-CM | POA: Diagnosis not present

## 2022-05-09 DIAGNOSIS — H353132 Nonexudative age-related macular degeneration, bilateral, intermediate dry stage: Secondary | ICD-10-CM | POA: Diagnosis not present

## 2022-05-09 DIAGNOSIS — Z961 Presence of intraocular lens: Secondary | ICD-10-CM | POA: Diagnosis not present

## 2022-05-09 DIAGNOSIS — H04123 Dry eye syndrome of bilateral lacrimal glands: Secondary | ICD-10-CM | POA: Diagnosis not present

## 2022-05-09 DIAGNOSIS — H02831 Dermatochalasis of right upper eyelid: Secondary | ICD-10-CM | POA: Diagnosis not present

## 2022-05-09 DIAGNOSIS — I2510 Atherosclerotic heart disease of native coronary artery without angina pectoris: Secondary | ICD-10-CM | POA: Diagnosis not present

## 2022-05-31 ENCOUNTER — Ambulatory Visit: Payer: Medicare Other | Attending: Nurse Practitioner | Admitting: Nurse Practitioner

## 2022-05-31 ENCOUNTER — Telehealth: Payer: Self-pay | Admitting: Nurse Practitioner

## 2022-05-31 ENCOUNTER — Encounter: Payer: Self-pay | Admitting: Nurse Practitioner

## 2022-05-31 VITALS — BP 95/57 | HR 83 | Ht 70.0 in | Wt 171.6 lb

## 2022-05-31 DIAGNOSIS — I959 Hypotension, unspecified: Secondary | ICD-10-CM | POA: Insufficient documentation

## 2022-05-31 DIAGNOSIS — I480 Paroxysmal atrial fibrillation: Secondary | ICD-10-CM | POA: Insufficient documentation

## 2022-05-31 DIAGNOSIS — I5022 Chronic systolic (congestive) heart failure: Secondary | ICD-10-CM | POA: Diagnosis not present

## 2022-05-31 DIAGNOSIS — Z01810 Encounter for preprocedural cardiovascular examination: Secondary | ICD-10-CM | POA: Insufficient documentation

## 2022-05-31 DIAGNOSIS — K831 Obstruction of bile duct: Secondary | ICD-10-CM | POA: Diagnosis not present

## 2022-05-31 DIAGNOSIS — J449 Chronic obstructive pulmonary disease, unspecified: Secondary | ICD-10-CM | POA: Diagnosis not present

## 2022-05-31 DIAGNOSIS — I2510 Atherosclerotic heart disease of native coronary artery without angina pectoris: Secondary | ICD-10-CM | POA: Insufficient documentation

## 2022-05-31 DIAGNOSIS — R42 Dizziness and giddiness: Secondary | ICD-10-CM | POA: Insufficient documentation

## 2022-05-31 MED ORDER — MIDODRINE HCL 5 MG PO TABS: 5.0000 mg | ORAL_TABLET | Freq: Three times a day (TID) | ORAL | 2 refills | Status: DC | PRN

## 2022-05-31 MED ORDER — MIDODRINE HCL 5 MG PO TABS: 2.5000 mg | ORAL_TABLET | Freq: Two times a day (BID) | ORAL | 2 refills | Status: DC | PRN

## 2022-05-31 NOTE — Telephone Encounter (Signed)
I will fax ov notes from Alexander Bud, NP who has cleared the pt.  Charlene from Dr. Colonel Bald called and gave fax# [phone].

## 2022-05-31 NOTE — Telephone Encounter (Signed)
Alexander Burton Patient states she is returning [name] call to give the fax number to send clearance, [phone]. Please advise.

## 2022-05-31 NOTE — Patient Instructions (Addendum)
Medication Instructions:  Begin Midodrine '5mg'$  three times per day as needed for blood pressure less than 90/60. Continue all other medications.     Labwork: none  Testing/Procedures: none  Follow-Up: Keep follow up as planned with Dr. Domenic Polite for February   Any Other Special Instructions Will Be Listed Below (If Applicable). You have been referred to CHF clinic  Provider will call you next week to check in on how doing.   Salty six info sheet given today.    If you need a refill on your cardiac medications before your next appointment, please call your pharmacy.

## 2022-05-31 NOTE — Progress Notes (Signed)
Cardiology Office Note:    Date:  05/31/2022   ID:  Alexander Burton, DOB 01/09/1941, MRN [id number]  PCP:  Celene Squibb, MD   [facility] Providers Cardiologist:  Rozann Lesches, MD     Referring MD: Celene Squibb, MD   CC: Here for follow-up and pre-operative cardiovascular risk assessment  History of Present Illness:    Alexander Burton is a 82 y.o. male with a hx of the following:   CAD, s/p DES to LAD in 2010 PAF Chronic thrombocytopenia -followed by Dr. 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  Underwent repeat Myoview earlier this year that was low risk, no evidence of ischemia.  Last seen by Dr. Domenic Polite on April 05, 2022.  Patient noted more dyspnea on exertion over the last month.  EKG in office revealed A-fib, rate controlled with occasional PVCs, nonspecific ST-T changes.  Echocardiogram revealed mildly reduced EF at 40 to 45%, global hypokinesis of left ventricle, mildly dilated left ventricular internal cavity size, mild LVH dilated left and right atrium, small pericardial effusion that was anterior to the right ventricle and posterior to the left ventricle, mild MR, mild to moderate TR. Lopressor was increased to 25 mg twice daily.  Not anticoagulated given concerns for bleeding risk with chronic thrombocytopenia, therefore cardioversion was not a possibility given his atrial fibrillation.  Was told to follow-up in 3 months based on the echocardiogram results.  Today he presents for follow-up with his wife.  He states his breathing is improving. Denies any chest pain, palpitations, syncope, presyncope, orthopnea, PND, swelling, significant weight changes, acute bleeding, or claudication. Denies any falls. Does not check BP at home but does admit to orthostatic dizziness. Denies any dizziness or lightheadedness today  for visit. He is a retired Psychologist, sport and exercise and very active with doing yard work. Not on any blood thinners d/t chronic thrombocytopenia. Wife says main reason for visit today is for pre-operative cardiovascular risk assessment. Will be undergoing "liver stent replacement" with [facility], however I do not see faxed papers sent to our office for official clearance.  Wife states he has this procedure done every 4 months, has had sepsis multiple times due to this problem.  States he is overdue to have this procedure done.  Wife called [facility] office during visit today and requested I speak with them. Called and left VM to [facility] office during visit. Denies any other questions or concerns for today's visit.   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 Has now had multiple issues with chronic hepatic abscess. Currently has indwelling drain   COLONOSCOPY  08/16/2011   Procedure: COLONOSCOPY;  Surgeon: Rogene Houston, MD;  [facility];  Service: Endoscopy;  Laterality: N/A;  1200   ESOPHAGEAL DILATION N/A 01/20/2015   Procedure: ESOPHAGEAL DILATION;  Surgeon: Rogene Houston, MD;  [facility];  Service: Endoscopy;  Laterality:  N/A;   ESOPHAGEAL DILATION N/A 02/11/2020   Procedure: ESOPHAGEAL DILATION;  Surgeon: Rogene Houston, MD;  [facility];  Service: Endoscopy;  Laterality: N/A;   ESOPHAGOGASTRODUODENOSCOPY N/A 01/20/2015   Procedure: ESOPHAGOGASTRODUODENOSCOPY (EGD);  Surgeon: Rogene Houston, MD;  [facility];  Service: Endoscopy;  Laterality: N/A;  1200   ESOPHAGOGASTRODUODENOSCOPY N/A 02/11/2020   Procedure: ESOPHAGOGASTRODUODENOSCOPY (EGD);  Surgeon: Rogene Houston, MD;  [facility];  Service: Endoscopy;  Laterality: N/A;  730   LEA DOPPLER  07/28/2008   NORMAL RGT GROIN DUPLEX DOPPLER   liver stent     LIVER SURGERY  09-03/14   @ [facility]   Melanoma resection      Current Medications: Current Meds  Medication Sig   ciprofloxacin (CIPRO) 500 MG tablet Take 1 tablet by mouth as directed. Patient will take 1 tablet twice a day for 1 month; current regimen is Bactrim - will start Cipro in April   cycloSPORINE (RESTASIS) 0.05 % ophthalmic emulsion Place 1 drop into both eyes 2 (two) times daily.    finasteride (PROSCAR) 5 MG tablet Take 5 mg by mouth daily.   fluorouracil (EFUDEX) 5 % cream Apply topically daily.   metoprolol tartrate (LOPRESSOR) 25 MG tablet Take 1 tablet (25 mg total) by mouth 2 (two) times daily.   mirtazapine (REMERON) 7.5 MG tablet Take 7.5 mg by mouth at bedtime.   Multiple Vitamin (MULITIVITAMIN WITH MINERALS) TABS Take 1 tablet by mouth daily. Centrum Silver men 50+   Multiple Vitamins-Minerals (PRESERVISION AREDS) CAPS Take 1 capsule by mouth in the morning and at bedtime.    naproxen sodium (ALEVE) 220 MG tablet Take 220 mg by mouth 2 (two) times daily as needed (pain).    nitroGLYCERIN (NITROSTAT) 0.4 MG SL tablet Place 0.4 mg under the tongue every 5 (five) minutes as needed for chest pain.   Oxymetazoline HCl (AFRIN NASAL SPRAY NA) Place 2 sprays into the nose 2 (two) times daily as needed.    pantoprazole (PROTONIX) 40 MG tablet Take 40 mg by  mouth at bedtime.    pravastatin (PRAVACHOL) 20 MG tablet TAKE 1 TABLET(20 MG) BY MOUTH DAILY   Probiotic Product (PROBIOTIC DAILY PO) Take 1 tablet by mouth daily.   Sodium Chloride Flush (NORMAL SALINE FLUSH) 0.9 % SOLN Use 10 mLs by Intracatheter route 3 (three) times daily as needed for Line Care.   sodium chloride flush 0.9 % SOLN injection 10 mLs by Intracatheter route 3 (three) times daily   sodium chloride flush 0.9 % SOLN injection Use 10 mLs by Intracatheter route 3 (three) times daily.   sodium chloride flush 0.9 % SOLN injection Use 10 mLs by Intracatheter route 3 (three) times daily.   tamsulosin (FLOMAX) 0.4 MG CAPS capsule Take 0.4 mg by mouth in the morning and at bedtime.    traMADol (ULTRAM) 50 MG tablet Take 1 tablet by mouth every 6 (six) hours as needed.   ursodiol (ACTIGALL) 250 MG tablet Take 1 tablet (250 mg total) by mouth in the  morning and at bedtime.   [DISCONTINUED] midodrine (PROAMATINE) 5 MG tablet Take 1 tablet (5 mg total) by mouth 3 (three) times daily as needed (for blood pressure less than 90/60).     Allergies:   Contrast media [iodinated contrast media], Metrizamide, and Other   Social History   Socioeconomic History   Marital status: Married    Spouse name: Not on file   Number of children: 2   Years of education: Not on file   Highest education level: Not on file  Occupational History    Employer: RETIRED  Tobacco Use   Smoking status: Former    Packs/day: 1.50    Years: 50.00    Total pack years: 75.00    Types: Cigarettes    Quit date: 05/29/2007    Years since quitting: 15.0   Smokeless tobacco: Never  Vaping Use   Vaping Use: Never used  Substance and Sexual Activity   Alcohol use: Not Currently    Alcohol/week: 1.0 standard drink of alcohol    Types: 1 Standard drinks or equivalent per week    Comment: very rare   Drug use: No   Sexual activity: Not Currently  Other Topics Concern   Not on file  Social History Narrative    Married father to come a grandfather 67. 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 Has more fatigue with walking but still able to walk up to 2 miles prior to his most recent hospitalization.   Social Determinants of Health   Financial Resource Strain: Low Risk  (04/04/2020)   Overall Financial Resource Strain (CARDIA)    Difficulty of Paying Living Expenses: Not hard at all  Food Insecurity: No Food Insecurity (04/04/2020)   Hunger Vital Sign    Worried About Running Out of Food in the Last Year: Never true    Ran Out of Food in the Last Year: Never true  Transportation Needs: No Transportation Needs (04/04/2020)   PRAPARE - Hydrologist (Medical): No    Lack of Transportation (Non-Medical): No  Physical Activity: Inactive (04/04/2020)   Exercise Vital Sign    Days of Exercise per Week: 0 days    Minutes of Exercise per Session: 0 min  Stress: No Stress Concern Present (04/04/2020)   [place]    Feeling of Stress : Only a little  Social Connections: Moderately Integrated (04/04/2020)   Social Connection and Isolation Panel [NHANES]    Frequency of Communication with Friends and Family: More than three times a week    Frequency of Social Gatherings with Friends and Family: More than three times a week    Attends Religious Services: More than 4 times per year    Active Member of [employer] or Organizations: No    Attends Archivist Meetings: Never    Marital Status: Married     Family History: The patient's family history includes Colon cancer in his sister; Emphysema in his father; Heart disease in his mother.  ROS:   Review of Systems  Constitutional: Negative.   HENT: Negative.    Eyes: Negative.   Respiratory: Negative.    Cardiovascular: Negative.   Gastrointestinal: Negative.   Genitourinary: Negative.   Skin: Negative.   Neurological:  Positive for dizziness.  Negative for tingling, tremors, sensory change, speech change, focal weakness, seizures, loss of consciousness, weakness and headaches.       See HPI.   Endo/Heme/Allergies:  Negative for environmental allergies and polydipsia. Bruises/bleeds easily.       Hx of chronic thrombocytopenia. Denies any recent, acute bleeding.   Psychiatric/Behavioral: Negative.      Please see the history of present illness.    All other systems reviewed and are negative.  EKGs/Labs/Other Studies Reviewed:    The following studies were reviewed today:  EKG:  EKG is ordered today.  The ekg ordered today demonstrates A-fib, rate controlled at 74 bpm, with PVCs, incomplete RBBB, nonspecific T wave abnormality, otherwise no acute ischemic changes.   2D echocardiogram on April 17, 2022: 1. Left ventricular ejection fraction, by estimation, is 40 to 45%. The  left ventricle has mildly decreased function. The left ventricle  demonstrates global hypokinesis. The left ventricular internal cavity size  was mildly dilated. There is mild left  ventricular hypertrophy. Left ventricular diastolic parameters are  indeterminate.   2. Right ventricular systolic function is mildly reduced. The right  ventricular size is normal. There is normal pulmonary artery systolic  pressure. The estimated right ventricular systolic pressure is 75.6 mmHg.   3. Left atrial size was severely dilated.   4. Right atrial size was moderately dilated.   5. A small pericardial effusion is present. The pericardial effusion is  anterior to the right ventricle and posterior to the left ventricle.   6. The mitral valve is degenerative. Mild mitral valve regurgitation.   7. Tricuspid valve regurgitation is mild to moderate.   8. The aortic valve is tricuspid. There is mild calcification of the  aortic valve. Aortic valve regurgitation is not visualized. Aortic valve  sclerosis is present, with no evidence of aortic valve stenosis.   9. The  inferior vena cava is normal in size with greater than 50%  respiratory variability, suggesting right atrial pressure of 3 mmHg.   Comparison(s): Prior images unable to be directly viewed.  Myoview on Oct 02, 2021:   LV perfusion is normal. There is no evidence of ischemia. There is no evidence of infarction. Reduced counts in the inferior segments with normal wall motion consistent with diaphragm attenuation.   The study is normal. The study is low risk.   Left ventricular function is normal. Nuclear stress EF: 48 %. The left ventricular ejection fraction is mildly decreased (45-54%). End diastolic cavity size is normal. Normal LVEF for this modality.   Prior study available for comparison from 04/19/2015. No changes compared to prior study.  Recent Labs: No results found for requested labs within last 365 days.  Recent Lipid Panel    Component Value Date/Time   CHOL 154 04/19/2015 0951     Risk Assessment/Calculations:    CHA2DS2-VASc Score = 4  This indicates a 4.8% annual risk of stroke. The patient's score is based upon: CHF History: 1 HTN History: 1 Diabetes History: 0 Stroke History: 0 Vascular Disease History: 0 Age Score: 2 Gender Score: 0  Physical Exam:    VS:  BP (!) 95/57 (BP Location: Left Arm, Patient Position: Sitting, Cuff Size: Normal)   Pulse 83   Ht '5\' 10"'$  (1.778 m)   Wt 171 lb 9.6 oz (77.8 kg)   SpO2 96%   BMI 24.62 kg/m     Wt Readings from Last 3 Encounters:  05/31/22 171 lb 9.6 oz (77.8 kg)  04/05/22 184 lb (83.5 kg)  09/15/21 185 lb (83.9 kg)    Vitals:   05/31/22 0923 05/31/22 1002  BP: 94/62 (!) 95/57  Pulse: 83   SpO2: 96%  Orthostatic VS for the past 24 hrs:  BP- Lying Pulse- Lying BP- Sitting Pulse- Sitting BP- Standing at 0 minutes Pulse- Standing at 0 minutes  05/31/22 0958 (!) 88/62 81 (!) 80/59 84 (!) 80/58 50    GEN: Well nourished, well developed in no acute distress HEENT: Normal NECK: No JVD; No carotid  bruits CARDIAC: S1/S2, RRR, no murmurs, rubs, gallops; 2+ peripheral pulses throughout, strong and equal bilaterally RESPIRATORY:  Clear and diminished to auscultation without rales, wheezing or rhonchi  ABDOMEN: Soft, non-tender, non-distended, bowel sounds x 4 MUSCULOSKELETAL:  No edema; No deformity  SKIN: Warm and dry NEUROLOGIC:  Alert and oriented x 3 PSYCHIATRIC:  Normal affect   ASSESSMENT:    1. Pre-operative cardiovascular examination   2. Coronary artery disease involving native heart without angina pectoris, unspecified vessel or lesion type   3. Paroxysmal atrial fibrillation (HCC)   4. Heart failure with mildly reduced ejection fraction (HFmrEF) ([place])   5. Biliary stricture   6. Hypotension, unspecified hypotension type   7. Orthostatic dizziness   8. Chronic obstructive pulmonary disease, unspecified COPD type ([place])    PLAN:    In order of problems listed above:  Pre-operative cardiovascular risk assessment Mr. Sharman's perioperative risk of a major cardiac event is 0.9% according to the Revised Cardiac Risk Index (RCRI).  Therefore, he is at low risk for perioperative complications.   His functional capacity is excellent at 6.36 METs according to the [facility] Activity Status Index (DASI). Recommendations: According to ACC/AHA guidelines, no further cardiovascular testing needed.  The patient may proceed to surgery at acceptable risk.   Antiplatelet and/or Anticoagulation Recommendations: Patient is not on aspirin or blood thinner that needs to be held prior to procedure.  He does not require SBE prophylaxis prior to procedure.  Will route this note to requesting party once I have information faxed over from [facility] office to our office.  I do not have information regarding performing surgeon, surgery center, or surgery date. Will consult Dr. Domenic Polite.   Case reviewed and discussed with Dr. Domenic Polite today.  Stated patient is at acceptable risk to proceed with upcoming  procedure.  Will fax this note to requesting party once I have more information.  2. CAD, s/p DES to LAD in 2010 Stable with no anginal symptoms. No indication for ischemic evaluation. Myoview from May 2023 was normal. Continue current medication regimen. Heart healthy diet and regular cardiovascular exercise encouraged.   3. PAF He is in atrial fibrillation today on exam, rate controlled.  Not on anticoagulation due to risk of bleeding with chronic thrombocytopenia.  He is not a candidate for cardioversion.  Continue Lopressor.  I recommended decreasing Lopressor due to blood pressures today, however wife was hesitant for me to do this.  Heart healthy diet and regular cardiovascular exercise encouraged.   4.  Heart failure mildly reduced EF Etiology uncertain.  Could be due to chronic A-fib/history of sepsis.  Echocardiogram in November 2023 revealed mildly reduced EF at 40 to 45%, global hypokinesis noted of left ventricle, mild LVH.  This is reduced from 2018 that was found to be 55 to 60%. Euvolemic and well compensated on exam.  Cannot uptitrate GDMT at this time due to blood pressures.  Will refer to heart failure clinic.  Continue Lopressor and rest of current medication regimen. Low sodium diet, fluid restriction <2L, and daily weights encouraged. Educated to contact our office for weight gain of 2 lbs overnight or 5 lbs in one week.  5. Biliary stricture-PPV in place, followed by [facility] for stent to be replaced.  As stated above, I do not have faxed form sent to our office visit and need more information before I send my office visit note to requesting party.  According to ACC/AHA guidelines, no further cardiovascular testing is needed as mentioned above.  He may proceed to surgery at acceptable risk after Dr. Domenic Polite has been consulted.  Continue to follow with PCP and [facility].  6. Hypotension, orthostatic dizziness Initial BP on arrival 94/62.  Repeat BP by me 95/57.  Orthostatics  negative, however he does remain hypotensive.  Consulted Dr. Domenic Polite who recommended starting midodrine 2.5 mg twice daily PRN and titrate up from there.   Recommended decreasing Lopressor dosage, however wife verbalized hesitancy regarding this.  ED precautions discussed. Given BP log and discussed to monitor BP at home at least 2 hours after medications and sitting for 5-10 minutes.  Recommended obtaining Omron cuff. Heart healthy diet and regular cardiovascular exercise encouraged.  Orthostatics negative.  Discussed conservative measures including wearing compression stockings, staying adequately hydrated, and changing positions slowly. Will call next week with BP readings.   7. COPD Denies any acute exacerbations or worsening symptoms.  Continue current medication regimen. Continue to follow with PCP.  8. Disposition: Follow-up with Dr. Domenic Polite as scheduled next month.   Medication Adjustments/Labs and Tests Ordered: Current medicines are reviewed at length with the patient today.  Concerns regarding medicines are outlined above.  Orders Placed This Encounter  Procedures   AMB referral to CHF clinic   EKG 12-Lead   Meds ordered this encounter  Medications   DISCONTD: midodrine (PROAMATINE) 5 MG tablet    Sig: Take 1 tablet (5 mg total) by mouth 3 (three) times daily as needed (for blood pressure less than 90/60).    Dispense:  30 tablet    Refill:  2    New 05/31/2022   midodrine (PROAMATINE) 5 MG tablet    Sig: Take 0.5 tablets (2.5 mg total) by mouth 2 (two) times daily as needed (for blood pressure less than 90/60).    Dispense:  30 tablet    Refill:  2    New 05/31/2022    Order Specific Question:   Supervising Provider    Answer:   Arnoldo Lenis [[id number]]    Patient Instructions  Medication Instructions:  Begin Midodrine 2.5 mg two times per day as needed for blood pressure less than 90/60. Continue all other medications.      Labwork: none  Testing/Procedures: none  Follow-Up: Keep follow up as planned with Dr. Domenic Polite for February   Any Other Special Instructions Will Be Listed Below (If Applicable). You have been referred to CHF clinic  Provider will call you next week to check in on how doing.   Salty six info sheet given today.    If you need a refill on your cardiac medications before your next appointment, please call your pharmacy.    SignedFinis Bud, NP  05/31/2022 12:37 PM    [place]

## 2022-06-01 ENCOUNTER — Telehealth: Payer: Self-pay | Admitting: *Deleted

## 2022-06-01 NOTE — Telephone Encounter (Signed)
Call placed to patient for change in medication after visit on 05/31/2022 - after E. [place] Calix, NP discussed further with Dr. Domenic Polite - Midodrine dose should be decreased to 2.'5mg'$  twice a day as needed for BP < 90/60.  Wife [name]) made aware & verbalized understanding.  Stated they did buy the bp monitor & BP last evening was 108/70  60 & this morning was 103/69  82.

## 2022-06-02 NOTE — Telephone Encounter (Signed)
Noted. Have sent pre-op clearance note to fax number listed.   Finis Bud, NP

## 2022-06-04 ENCOUNTER — Other Ambulatory Visit (HOSPITAL_COMMUNITY): Payer: Self-pay

## 2022-06-04 MED ORDER — SODIUM CHLORIDE FLUSH 0.9 % IV SOLN
10.0000 mL | Freq: Three times a day (TID) | INTRAVENOUS | 0 refills | Status: DC
  Filled 2022-06-04: qty 2700, 90d supply, fill #0

## 2022-06-06 ENCOUNTER — Other Ambulatory Visit (HOSPITAL_COMMUNITY): Payer: Self-pay

## 2022-06-07 ENCOUNTER — Other Ambulatory Visit (HOSPITAL_COMMUNITY): Payer: Self-pay

## 2022-06-13 DIAGNOSIS — K831 Obstruction of bile duct: Secondary | ICD-10-CM | POA: Diagnosis not present

## 2022-06-13 DIAGNOSIS — I4891 Unspecified atrial fibrillation: Secondary | ICD-10-CM | POA: Diagnosis not present

## 2022-06-13 DIAGNOSIS — Z91041 Radiographic dye allergy status: Secondary | ICD-10-CM | POA: Diagnosis not present

## 2022-06-13 DIAGNOSIS — I10 Essential (primary) hypertension: Secondary | ICD-10-CM | POA: Diagnosis not present

## 2022-06-13 DIAGNOSIS — G4733 Obstructive sleep apnea (adult) (pediatric): Secondary | ICD-10-CM | POA: Diagnosis not present

## 2022-06-13 DIAGNOSIS — D696 Thrombocytopenia, unspecified: Secondary | ICD-10-CM | POA: Diagnosis not present

## 2022-06-13 DIAGNOSIS — K8309 Other cholangitis: Secondary | ICD-10-CM | POA: Diagnosis not present

## 2022-06-13 DIAGNOSIS — Z4659 Encounter for fitting and adjustment of other gastrointestinal appliance and device: Secondary | ICD-10-CM | POA: Diagnosis not present

## 2022-06-13 DIAGNOSIS — Z79899 Other long term (current) drug therapy: Secondary | ICD-10-CM | POA: Diagnosis not present

## 2022-06-13 DIAGNOSIS — Z791 Long term (current) use of non-steroidal anti-inflammatories (NSAID): Secondary | ICD-10-CM | POA: Diagnosis not present

## 2022-06-14 DIAGNOSIS — I10 Essential (primary) hypertension: Secondary | ICD-10-CM | POA: Diagnosis not present

## 2022-06-14 DIAGNOSIS — G4733 Obstructive sleep apnea (adult) (pediatric): Secondary | ICD-10-CM | POA: Diagnosis not present

## 2022-06-14 DIAGNOSIS — K831 Obstruction of bile duct: Secondary | ICD-10-CM | POA: Diagnosis not present

## 2022-06-14 DIAGNOSIS — Z4659 Encounter for fitting and adjustment of other gastrointestinal appliance and device: Secondary | ICD-10-CM | POA: Diagnosis not present

## 2022-06-14 DIAGNOSIS — D696 Thrombocytopenia, unspecified: Secondary | ICD-10-CM | POA: Diagnosis not present

## 2022-06-14 DIAGNOSIS — I4891 Unspecified atrial fibrillation: Secondary | ICD-10-CM | POA: Diagnosis not present

## 2022-06-26 DIAGNOSIS — R0600 Dyspnea, unspecified: Secondary | ICD-10-CM | POA: Diagnosis not present

## 2022-06-26 DIAGNOSIS — D649 Anemia, unspecified: Secondary | ICD-10-CM | POA: Diagnosis not present

## 2022-06-26 DIAGNOSIS — I2510 Atherosclerotic heart disease of native coronary artery without angina pectoris: Secondary | ICD-10-CM | POA: Diagnosis not present

## 2022-06-26 DIAGNOSIS — I5022 Chronic systolic (congestive) heart failure: Secondary | ICD-10-CM | POA: Diagnosis not present

## 2022-06-26 DIAGNOSIS — I10 Essential (primary) hypertension: Secondary | ICD-10-CM | POA: Diagnosis not present

## 2022-06-26 DIAGNOSIS — K8309 Other cholangitis: Secondary | ICD-10-CM | POA: Diagnosis not present

## 2022-06-26 DIAGNOSIS — I480 Paroxysmal atrial fibrillation: Secondary | ICD-10-CM | POA: Diagnosis not present

## 2022-06-26 DIAGNOSIS — R630 Anorexia: Secondary | ICD-10-CM | POA: Diagnosis not present

## 2022-06-26 DIAGNOSIS — I110 Hypertensive heart disease with heart failure: Secondary | ICD-10-CM | POA: Diagnosis not present

## 2022-06-26 DIAGNOSIS — I429 Cardiomyopathy, unspecified: Secondary | ICD-10-CM | POA: Diagnosis not present

## 2022-06-26 DIAGNOSIS — D696 Thrombocytopenia, unspecified: Secondary | ICD-10-CM | POA: Diagnosis not present

## 2022-06-26 DIAGNOSIS — R600 Localized edema: Secondary | ICD-10-CM | POA: Diagnosis not present

## 2022-07-09 ENCOUNTER — Ambulatory Visit (INDEPENDENT_AMBULATORY_CARE_PROVIDER_SITE_OTHER): Payer: Medicare Other | Admitting: Gastroenterology

## 2022-07-09 ENCOUNTER — Encounter (INDEPENDENT_AMBULATORY_CARE_PROVIDER_SITE_OTHER): Payer: Self-pay | Admitting: Gastroenterology

## 2022-07-09 VITALS — BP 119/74 | HR 67 | Temp 97.5°F | Ht 69.0 in | Wt 175.6 lb

## 2022-07-09 DIAGNOSIS — R1310 Dysphagia, unspecified: Secondary | ICD-10-CM | POA: Diagnosis not present

## 2022-07-09 DIAGNOSIS — K219 Gastro-esophageal reflux disease without esophagitis: Secondary | ICD-10-CM | POA: Insufficient documentation

## 2022-07-09 DIAGNOSIS — K8309 Other cholangitis: Secondary | ICD-10-CM | POA: Diagnosis not present

## 2022-07-09 NOTE — Progress Notes (Unsigned)
He is supposed to have his drain changed every 4 months.  He is having some dysphagia occasionally if he does not eat slow. He is pantoprazole 40 mg at night. Denies having any heartburn or odynophagia.  The patient denies having any nausea, vomiting, fever, chills, hematochezia, melena, hematemesis, abdominal distention, abdominal pain, diarrhea, jaundice, pruritus or weight loss.

## 2022-07-09 NOTE — Patient Instructions (Addendum)
Continue follow up with [facility] regarding biliary drainage Continue rotating antibiotic regimen Continue ursodiol 250 mg twice a day Call us back when you are ready to proceed with surveillance colonoscopy Call back if you are having worsening issues swallowing, will proceed with EGD at that time Continue pantoprazole every day

## 2022-07-09 NOTE — [facility] Notes (Unsigned)
[name], M.D. Gastroenterology & Hepatology [facility] [address] [place], [place] [address]  Primary Care Physician: Celene Squibb, MD [address] [id number]  I will communicate my assessment and recommendations to the referring MD via EMR.  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 male with past medical history of BPH, coronary artery disease status post stent placement, COPD, hepatic abscess, melanoma, atrial fibrillation, hyperlipidemia, heart failure with decreased ejection fraction, sleep apnea, history of recurrent cholangitis due to hepaticojejunostomy stricture managed at [facility] with multiple dilations on chronic antibiotic suppression (history of biliary duct injury), who presents for follow up of chronic dysphagia and recurrent cholangitis due to postsurgical recurrent biliary stricture.  The patient was last seen on 05/10/2022. At that time, the patient was scheduled to undergo an EGD for evaluation of abdominal pain and occasional dysphagia but he did not proceed with this..  Patient reports feeling well and denies any complaints.  Has very occasional dysphagia but he is able to manage it by chewing his food thoroughly.  Has not had any food impaction episodes.  No heartburn or odynophagia.   He is pantoprazole 40 mg at night. Denies having any heartburn or odynophagia. The patient denies having any nausea, vomiting, fever, chills, hematochezia, melena, hematemesis, abdominal distention, abdominal pain, diarrhea, jaundice, pruritus or weight loss.  Regarding his recurrent biliary stricture, he has been followed at [facility] for this.  Has required recurrent biliary drain exchange.  He is on URSO 250 mg twice daily.  The  patient was recently seen at [facility] for PBD exchange by IR on 06/13/2022.  He will continue on rotating courses of ciprofloxacin, Keflex and Augmentin,. He is supposed to have his drain changed every 4 months.   Notably, his most recent labs from 06/14/2022 showed a hemoglobin of 10.5, WBC 6.9, platelets 70,000,AST 19, ALT 11, normal electrolytes and renal function, total bilirubin 0.6, alkaline phosphatase 96, albumin 2.6.  Last EGD: 02/11/2020 Presence of stricture at 40 cm which was dilated with an 18 mm balloon, gastropathy, normal duodenum.  Last Colonoscopy: 2013 Examination performed to cecum. 12 x 5 mm Sessile cecal polyp snared following saline injection and Hemoclip applied to polypectomy site. Small polyp ablated via cold biopsy from transverse colon. Multiple diverticula at sigmoid colon.  Path: both polyps are TA.  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 Has now had multiple issues with chronic hepatic abscess. Currently has indwelling drain    COLONOSCOPY  08/16/2011   Procedure: COLONOSCOPY;  Surgeon: Rogene Houston, MD;  [facility];  Service: Endoscopy;  Laterality: N/A;  1200   ESOPHAGEAL DILATION N/A 01/20/2015   Procedure: ESOPHAGEAL DILATION;  Surgeon: Rogene Houston, MD;  [facility];  Service: Endoscopy;  Laterality: N/A;   ESOPHAGEAL DILATION N/A 02/11/2020   Procedure: ESOPHAGEAL DILATION;  Surgeon: Rogene Houston, MD;  [facility];  Service: Endoscopy;  Laterality: N/A;   ESOPHAGOGASTRODUODENOSCOPY N/A 01/20/2015   Procedure: ESOPHAGOGASTRODUODENOSCOPY (EGD);  Surgeon: Rogene Houston, MD;  [facility];  Service: Endoscopy;  Laterality: N/A;  1200   ESOPHAGOGASTRODUODENOSCOPY N/A 02/11/2020   Procedure: ESOPHAGOGASTRODUODENOSCOPY (EGD);  Surgeon: Rogene Houston, MD;  [facility];  Service: Endoscopy;  Laterality: N/A;  730   LEA DOPPLER  07/28/2008   NORMAL RGT GROIN DUPLEX DOPPLER   liver stent     LIVER SURGERY  09-03/14   @ [facility]   Melanoma resection      Family History: Family History  Problem Relation Age of Onset   Colon cancer Sister    Heart disease Mother    Emphysema Father     Social History: Social History   Tobacco Use  Smoking Status Former   Packs/day: 1.50   Years: 50.00   Total pack years: 75.00   Types: Cigarettes   Quit date: 05/29/2007   Years since quitting: 15.1  Smokeless Tobacco Never   Social History   Substance and Sexual Activity  Alcohol Use Not Currently   Alcohol/week: 1.0 standard drink of alcohol   Types: 1 Standard drinks or equivalent per week   Comment: very rare   Social History   Substance and Sexual Activity  Drug Use No    Allergies: Allergies  Allergen Reactions   Contrast Media [Iodinated Contrast Media]     Hives, needs pre meds   Metrizamide Hives    Hives, needs pre meds   Other Itching and Other (See Comments)    Contrast dye erythema    Medications: Current Outpatient  Medications  Medication Sig Dispense Refill   ciprofloxacin (CIPRO) 500 MG tablet Take 1 tablet by mouth as directed. Patient will take 1 tablet twice a day for 1 month; current regimen is Bactrim - will start Cipro in April     finasteride (PROSCAR) 5 MG tablet Take 5 mg by mouth daily.     furosemide (LASIX) 20 MG tablet Take 20 mg by mouth as needed.     metoprolol tartrate (LOPRESSOR) 25 MG tablet Take 1 tablet (25 mg total) by mouth 2 (two) times daily. 180 tablet 3   midodrine (PROAMATINE) 5 MG tablet Take 0.5 tablets (2.5 mg total) by mouth 2 (two) times daily as needed (for blood pressure less than 90/60). 30 tablet 2   mirtazapine (REMERON) 7.5 MG tablet Take 7.5 mg by mouth at bedtime.     Multiple Vitamin (MULITIVITAMIN WITH MINERALS) TABS Take 1 tablet by mouth daily. Centrum Silver men 50+     Multiple Vitamins-Minerals (PRESERVISION AREDS) CAPS Take 1 capsule by mouth in the morning and at bedtime.      naproxen sodium (ALEVE) 220 MG tablet Take 220 mg by mouth 2 (two) times daily as  needed (pain).      nitroGLYCERIN (NITROSTAT) 0.4 MG SL tablet Place 0.4 mg under the tongue every 5 (five) minutes as needed for chest pain.     Oxymetazoline HCl (AFRIN NASAL SPRAY NA) Place 2 sprays into the nose 2 (two) times daily as needed.      pantoprazole (PROTONIX) 40 MG tablet Take 40 mg by mouth at bedtime.      pravastatin (PRAVACHOL) 20 MG tablet TAKE 1 TABLET(20 MG) BY MOUTH DAILY 90 tablet 3   Probiotic Product (PROBIOTIC DAILY PO) Take 1 tablet by mouth daily.     Sodium Chloride Flush (NORMAL SALINE FLUSH) 0.9 % SOLN Use 10 mLs by Intracatheter route 3 (three) times daily as needed for Line Care. 900 mL 6   sodium chloride flush 0.9 % SOLN injection 10 mLs by Intracatheter route 3 (three) times daily 90 mL 6   sodium chloride flush 0.9 % SOLN injection Use 10 mLs by Intracatheter route 3 (three) times daily. 5400 mL 0   sodium chloride flush 0.9 % SOLN injection Use 10 mLs by  Intracatheter route 3 (three) times daily. 900 mL 6   sodium chloride flush 0.9 % SOLN injection Use 10 mLs by Intracatheter route 3 (three) times daily. 2700 mL 0   tamsulosin (FLOMAX) 0.4 MG CAPS capsule Take 0.4 mg by mouth in the morning and at bedtime.   3   ursodiol (ACTIGALL) 250 MG tablet Take 1 tablet (250 mg total) by mouth in the morning and at bedtime. 120 tablet 5   cycloSPORINE (RESTASIS) 0.05 % ophthalmic emulsion Place 1 drop into both eyes 2 (two) times daily.  (Patient not taking: Reported on 07/09/2022)     traMADol (ULTRAM) 50 MG tablet Take 1 tablet by mouth every 6 (six) hours as needed. (Patient not taking: Reported on 07/09/2022)     No current facility-administered medications for this visit.    Review of Systems: GENERAL: negative for malaise, night sweats HEENT: No changes in hearing or vision, no nose bleeds or other nasal problems. NECK: Negative for lumps, goiter, pain and significant neck swelling RESPIRATORY: Negative for cough, wheezing CARDIOVASCULAR: Negative for chest pain, leg swelling, palpitations, orthopnea GI: SEE HPI MUSCULOSKELETAL: Negative for joint pain or swelling, back pain, and muscle pain. SKIN: Negative for lesions, rash PSYCH: Negative for sleep disturbance, mood disorder and recent psychosocial stressors. HEMATOLOGY Negative for prolonged bleeding, bruising easily, and swollen nodes. ENDOCRINE: Negative for cold or heat intolerance, polyuria, polydipsia and goiter. NEURO: negative for tremor, gait imbalance, syncope and seizures. The remainder of the review of systems is noncontributory.   Physical Exam: BP 119/74 (BP Location: Left Arm, Patient Position: Sitting, Cuff Size: Large)   Pulse 67   Temp (!) 97.5 F (36.4 C) (Temporal)   Ht 5' 9"$  (1.753 m)   Wt 175 lb 9.6 oz (79.7 kg)   BMI 25.93 kg/m  GENERAL: The patient is AO x3, in no acute distress. HEENT: Head is normocephalic and atraumatic. EOMI are intact. Mouth is well  hydrated and without lesions. NECK: Supple. No masses LUNGS: Clear to auscultation. No presence of rhonchi/wheezing/rales. Adequate chest expansion HEART: RRR, normal s1 and s2. ABDOMEN: Soft, nontender, no guarding, no peritoneal signs, and nondistended. BS +. No masses.  Has external biliary drain in the epigastric area. EXTREMITIES: Without any cyanosis, clubbing, rash, lesions or edema. NEUROLOGIC: AOx3, no focal motor deficit. SKIN: no jaundice, no rashes  Imaging/Labs: as above  I personally reviewed and interpreted the  available labs, imaging and endoscopic files.  Impression and Plan: Alexander Burton is a 82 y.o. male with past medical history of BPH, coronary artery disease status post stent placement, COPD, hepatic abscess, melanoma, atrial fibrillation, hyperlipidemia, heart failure with decreased ejection fraction, sleep apnea, history of recurrent cholangitis due to hepaticojejunostomy stricture managed at [facility] with multiple dilations on chronic antibiotic suppression (history of biliary duct injury), who presents for follow up of chronic dysphagia and recurrent cholangitis due to postsurgical recurrent biliary stricture.  His dysphagia is still present but not presenting any episodes of food impaction.  He is currently taking pantoprazole daily with control of heartburn symptoms which may prevent recurrent stricturing disease.  He would like to hold off on performing any endoscopic evaluation at this moment as his wife has too many appointments for management of her lymphoma.  He will let me know if his dysphagia worsens and we will proceed with scheduling this at that time.  The patient has been doing well in terms of his recurrent cholangitis and stricture episodes.  This has been managed with cyclin antibiotics effectively and he has not been hospitalized recently 4 days.  Also, he has undergone change of his biliary drain every 4 months by interventional radiology at [facility] without  any recurrent episodes of obstruction.  He should continue with this plan for now and following at [facility], along with the use of URSO.  Finally, he is due for colorectal cancer screening, we discussed the possibility of proceeding with a colonoscopy but similarly as with the esophagogastroduodenospy, he will need to find the right time to proceed with this as his wife has many appointments scheduled in the near future.  - Continue follow up with [facility] regarding biliary drainage - Continue rotating antibiotic regimen (ciprofloxacin, Keflex and Augmentin) - Continue ursodiol 250 mg twice a day - Patient to call us back when ready to proceed with surveillance colonoscopy - Patient should call back if worsening dysphagia, will proceed with EGD at that time - Continue pantoprazole 40 mg every day  All questions were answered.      [name], MD Gastroenterology and Hepatology [facility]

## 2022-07-12 ENCOUNTER — Ambulatory Visit (INDEPENDENT_AMBULATORY_CARE_PROVIDER_SITE_OTHER): Payer: Medicare Other | Admitting: Gastroenterology

## 2022-07-13 ENCOUNTER — Other Ambulatory Visit (INDEPENDENT_AMBULATORY_CARE_PROVIDER_SITE_OTHER): Payer: Self-pay | Admitting: Gastroenterology

## 2022-07-19 ENCOUNTER — Encounter: Payer: Self-pay | Admitting: Cardiology

## 2022-07-19 ENCOUNTER — Ambulatory Visit: Payer: Medicare Other | Attending: Cardiology | Admitting: Cardiology

## 2022-07-19 VITALS — BP 112/87 | HR 90 | Ht 71.0 in | Wt 170.8 lb

## 2022-07-19 DIAGNOSIS — I25119 Atherosclerotic heart disease of native coronary artery with unspecified angina pectoris: Secondary | ICD-10-CM | POA: Diagnosis not present

## 2022-07-19 DIAGNOSIS — I4819 Other persistent atrial fibrillation: Secondary | ICD-10-CM | POA: Insufficient documentation

## 2022-07-19 DIAGNOSIS — I5022 Chronic systolic (congestive) heart failure: Secondary | ICD-10-CM | POA: Insufficient documentation

## 2022-07-19 MED ORDER — MIDODRINE HCL 2.5 MG PO TABS: 2.5000 mg | ORAL_TABLET | Freq: Two times a day (BID) | ORAL | 3 refills | Status: DC

## 2022-07-19 MED ORDER — FUROSEMIDE 20 MG PO TABS: 20.0000 mg | ORAL_TABLET | Freq: Every day | ORAL | 3 refills | Status: DC | PRN

## 2022-07-19 NOTE — [facility] Notes (Signed)
Cardiology Office Note  Date: 07/19/2022   ID: Alexander Burton, Alexander Burton 1941/02/23, MRN [id number]  PCP:  Celene Squibb, MD  Cardiologist:  Rozann Lesches, MD Electrophysiologist:  None   Chief Complaint  Patient presents with   Cardiac follow-up    History of Present Illness: Alexander Burton is an 82 y.o. male last seen in January by Ms. Arlington Calix NP, I reviewed the note.  He is here today with his daughter for follow-up visit.  Overall, states that he feels better in terms of shortness of breath currently reporting NYHA class II symptoms.  States that he has been weighing daily with fluctuations of only a few pounds at a time and no interval use of as needed Lasix.  His blood pressure looks better today on midodrine and he otherwise continues on Lopressor for heart rate control of atrial fibrillation.  We went over his medications today and discussed his overall cardiac status in terms of cardiomyopathy and atrial fibrillation management.  Although referral has been made to the advanced heart failure clinic after the last visit, our discussion today centered on relatively conservative medical management without further invasive cardiac workup.  He voiced the most comfort with this approach.  I reviewed his recent lab work as noted below.  Gastroenterology follow-up continues locally with Dr. Jenetta Downer, he continues to go to [facility] as well.  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 For Dental Procedures     ciprofloxacin (CIPRO) 500 MG tablet Take 1 tablet by mouth as directed. Patient will take 1 tablet twice a day for 1 month; current regimen is Bactrim - will start Cipro in April     cycloSPORINE (RESTASIS) 0.05 % ophthalmic emulsion Place 1 drop into both eyes 2 (two) times daily.     finasteride (PROSCAR) 5 MG tablet Take 5 mg by mouth daily.     furosemide (LASIX) 20 MG tablet Take 1 tablet (20 mg total) by mouth daily as needed. 90 tablet 3   metoprolol tartrate (LOPRESSOR) 25 MG tablet Take 1 tablet (25 mg total) by mouth 2 (two) times daily. 180 tablet 3   mirtazapine (REMERON) 7.5 MG tablet Take 7.5 mg by mouth at bedtime.     Multiple Vitamin (MULITIVITAMIN WITH MINERALS) TABS Take 1 tablet by mouth daily. Centrum Silver men 50+     Multiple Vitamins-Minerals (PRESERVISION AREDS) CAPS Take 1 capsule by mouth in the morning and at bedtime.      naproxen sodium (ALEVE) 220 MG tablet Take 220 mg by mouth 2 (two) times daily as needed (pain).      nitroGLYCERIN (NITROSTAT) 0.4 MG SL tablet Place 0.4 mg under the tongue every 5 (five) minutes as needed for chest pain.  Oxymetazoline HCl (AFRIN NASAL SPRAY NA) Place 2 sprays into the nose 2 (two) times daily as needed.      pantoprazole (PROTONIX) 40 MG tablet Take 40 mg by mouth at bedtime.      potassium chloride (KLOR-CON) 10 MEQ tablet Take 1 tablet by mouth every other day.     pravastatin (PRAVACHOL) 20 MG tablet TAKE 1 TABLET(20 MG) BY MOUTH DAILY 90 tablet 3   Probiotic Product (PROBIOTIC DAILY PO) Take 1 tablet by mouth daily.     Sodium Chloride Flush (NORMAL SALINE FLUSH) 0.9 % SOLN Use 10 mLs by Intracatheter route 3 (three) times daily as needed for Line Care. 900 mL 6   tamsulosin (FLOMAX) 0.4 MG CAPS capsule Take 0.4 mg by mouth in the  morning and at bedtime.   3   ursodiol (ACTIGALL) 250 MG tablet TAKE 1 TABLET(250 MG) BY MOUTH IN THE MORNING AND AT BEDTIME 120 tablet 5   midodrine (PROAMATINE) 2.5 MG tablet Take 1 tablet (2.5 mg total) by mouth 2 (two) times daily with a meal. 180 tablet 3   sodium chloride flush 0.9 % SOLN injection 10 mLs by Intracatheter route 3 (three) times daily (Patient not taking: Reported on 07/19/2022) 90 mL 6   sodium chloride flush 0.9 % SOLN injection Use 10 mLs by Intracatheter route 3 (three) times daily. (Patient not taking: Reported on 07/19/2022) 5400 mL 0   sodium chloride flush 0.9 % SOLN injection Use 10 mLs by Intracatheter route 3 (three) times daily. (Patient not taking: Reported on 07/19/2022) 900 mL 6   sodium chloride flush 0.9 % SOLN injection Use 10 mLs by Intracatheter route 3 (three) times daily. (Patient not taking: Reported on 07/19/2022) 2700 mL 0   traMADol (ULTRAM) 50 MG tablet Take 1 tablet by mouth every 6 (six) hours as needed. (Patient not taking: Reported on 07/09/2022)     No current facility-administered medications for this visit.   Allergies:  Contrast media [iodinated contrast media], Metrizamide, and Other   ROS: No orthopnea or PND.  Fluctuating leg edema.  Physical Exam: VS:  BP 112/87   Pulse 90   Ht 5' 11"$  (1.803 m)   Wt 170 lb 12.8 oz (77.5 kg)   SpO2 96%   BMI 23.82 kg/m , BMI Body mass index is 23.82 kg/m.  Wt Readings from Last 3 Encounters:  07/19/22 170 lb 12.8 oz (77.5 kg)  07/09/22 175 lb 9.6 oz (79.7 kg)  05/31/22 171 lb 9.6 oz (77.8 kg)    General: Patient appears comfortable at rest. HEENT: Conjunctiva and lids normal. Neck: Supple, no elevated JVP or carotid bruits. Lungs: Clear to auscultation, nonlabored breathing at rest. Cardiac: Irregularly irregular, 1/6 systolic murmur, no gallop. Extremities: 2+ lower leg and ankle edema.  ECG:  An ECG dated 05/31/2022 was personally reviewed today and demonstrated:  Rate controlled atrial  fibrillation with incomplete right bundle branch block, PVC.  Recent Labwork:  January 2024: Hemoglobin 10.5, platelets 70, potassium 4.2, BUN 22, creatinine 1.1, AST 19, ALT 11  Other Studies Reviewed Today:  Lexiscan Myoview 10/02/2021:   LV perfusion is normal. There is no evidence of ischemia. There is no evidence of infarction. Reduced counts in the inferior segments with normal wall motion consistent with diaphragm attenuation.   The study is normal. The study is low risk.   Left ventricular function is normal. Nuclear stress EF: 48 %. The left ventricular ejection fraction is mildly decreased (45-54%). End diastolic cavity size is  normal. Normal LVEF for this modality.   Prior study available for comparison from 04/19/2015. No changes compared to prior study.  Echocardiogram 04/17/2022:  1. Left ventricular ejection fraction, by estimation, is 40 to 45%. The  left ventricle has mildly decreased function. The left ventricle  demonstrates global hypokinesis. The left ventricular internal cavity size  was mildly dilated. There is mild left  ventricular hypertrophy. Left ventricular diastolic parameters are  indeterminate.   2. Right ventricular systolic function is mildly reduced. The right  ventricular size is normal. There is normal pulmonary artery systolic  pressure. The estimated right ventricular systolic pressure is 0000000 mmHg.   3. Left atrial size was severely dilated.   4. Right atrial size was moderately dilated.   5. A small pericardial effusion is present. The pericardial effusion is  anterior to the right ventricle and posterior to the left ventricle.   6. The mitral valve is degenerative. Mild mitral valve regurgitation.   7. Tricuspid valve regurgitation is mild to moderate.   8. The aortic valve is tricuspid. There is mild calcification of the  aortic valve. Aortic valve regurgitation is not visualized. Aortic valve  sclerosis is present, with no evidence of aortic  valve stenosis.   9. The inferior vena cava is normal in size with greater than 50%  respiratory variability, suggesting right atrial pressure of 3 mmHg.   Assessment and Plan:  1.  Persistent atrial fibrillation with CHA2DS2-VASc score of 5.  Heart rate control is adequate on current dose of Lopressor and he is asymptomatic in terms of active palpitations.  He is not anticoagulated given high bleeding risk in the setting of hepatic disease and thrombocytopenia.  Not an optimal candidate for Watchman device either.  2.  HFmrEF with LVEF 40 to 45% and global hypokinesis by echocardiogram in November 2023.  As per discussion today we will plan conservative medical management at this point.  Symptomatically improved with NYHA class II dyspnea.  He is using Lasix with potassium supplement on an as-needed basis and weighing daily.  Otherwise on Lopressor and midodrine given relatively low blood pressure at baseline which further limits GDMT.  Plan to follow-up echocardiogram for next clinical assessment in 3 months.  It is possible that his cardiomyopathy could have been at least partially tachycardia induced.  3.  CAD status post DES to the LAD in 2010.  No active angina at this time and Myoview from May of last year showed no significant ischemia.  He is not on aspirin given thrombocytopenia and increased bleeding risk.  Continue Pravachol.  He has as needed nitroglycerin available.  Medication Adjustments/Labs and Tests Ordered: Current medicines are reviewed at length with the patient today.  Concerns regarding medicines are outlined above.   Tests Ordered: Orders Placed This Encounter  Procedures   ECHOCARDIOGRAM COMPLETE    Medication Changes: Meds ordered this encounter  Medications   DISCONTD: midodrine (PROAMATINE) 2.5 MG tablet    Sig: Take 1 tablet (2.5 mg total) by mouth 2 (two) times daily with a meal.    Dispense:  180 tablet    Refill:  3    07/19/22 Increased to 2.5 mg BID by  Dr.Asal Teas   furosemide (LASIX) 20 MG tablet    Sig: Take 1 tablet (20 mg total) by mouth daily as needed.    Dispense:  90 tablet    Refill:  3    07/19/22 dosing changed to daily as needed for swelling,weight gain   midodrine (PROAMATINE)  2.5 MG tablet    Sig: Take 1 tablet (2.5 mg total) by mouth 2 (two) times daily with a meal.    Dispense:  180 tablet    Refill:  3    07/19/22 Increased to 2.5 mg BID by Dr.Casmer Yepiz    Disposition:  Follow up  3 months.  Signed, Satira Sark, MD, [facility] 07/19/2022 11:40 AM    [facility] [address] [place], [place] [address] Phone: [phone]; Fax: [phone]

## 2022-07-19 NOTE — Patient Instructions (Signed)
Medication Instructions:   Take Midodrine 2.5 mg TWICE a day    Take Lasix 20 mg daily as needed for swelling   Labwork: None today   Testing/Procedures: Echo in 3 months  Follow-Up:  After Echo in 3 months   Any Other Special Instructions Will Be Listed Below (If Applicable).  If you need a refill on your cardiac medications before your next appointment, please call your pharmacy.

## 2022-07-29 ENCOUNTER — Other Ambulatory Visit: Payer: Self-pay | Admitting: Cardiology

## 2022-08-10 DIAGNOSIS — G609 Hereditary and idiopathic neuropathy, unspecified: Secondary | ICD-10-CM | POA: Diagnosis not present

## 2022-08-10 DIAGNOSIS — E782 Mixed hyperlipidemia: Secondary | ICD-10-CM | POA: Diagnosis not present

## 2022-08-16 ENCOUNTER — Encounter: Payer: Self-pay | Admitting: Internal Medicine

## 2022-08-16 DIAGNOSIS — R630 Anorexia: Secondary | ICD-10-CM | POA: Diagnosis not present

## 2022-08-16 DIAGNOSIS — R600 Localized edema: Secondary | ICD-10-CM | POA: Diagnosis not present

## 2022-08-16 DIAGNOSIS — I2510 Atherosclerotic heart disease of native coronary artery without angina pectoris: Secondary | ICD-10-CM | POA: Diagnosis not present

## 2022-08-16 DIAGNOSIS — I110 Hypertensive heart disease with heart failure: Secondary | ICD-10-CM | POA: Diagnosis not present

## 2022-08-16 DIAGNOSIS — I429 Cardiomyopathy, unspecified: Secondary | ICD-10-CM | POA: Diagnosis not present

## 2022-08-16 DIAGNOSIS — I5022 Chronic systolic (congestive) heart failure: Secondary | ICD-10-CM | POA: Diagnosis not present

## 2022-08-16 DIAGNOSIS — D649 Anemia, unspecified: Secondary | ICD-10-CM | POA: Diagnosis not present

## 2022-08-16 DIAGNOSIS — I480 Paroxysmal atrial fibrillation: Secondary | ICD-10-CM | POA: Diagnosis not present

## 2022-08-16 DIAGNOSIS — D696 Thrombocytopenia, unspecified: Secondary | ICD-10-CM | POA: Diagnosis not present

## 2022-08-16 DIAGNOSIS — I10 Essential (primary) hypertension: Secondary | ICD-10-CM | POA: Diagnosis not present

## 2022-08-16 DIAGNOSIS — K8309 Other cholangitis: Secondary | ICD-10-CM | POA: Diagnosis not present

## 2022-08-16 DIAGNOSIS — R0600 Dyspnea, unspecified: Secondary | ICD-10-CM | POA: Diagnosis not present

## 2022-09-24 ENCOUNTER — Encounter (HOSPITAL_COMMUNITY): Payer: Self-pay | Admitting: Cardiology

## 2022-09-24 ENCOUNTER — Ambulatory Visit (HOSPITAL_COMMUNITY)
Admission: RE | Admit: 2022-09-24 | Discharge: 2022-09-24 | Disposition: A | Discharge status: Home / Self Care | Payer: Medicare Other | Source: Ambulatory Visit | Attending: Cardiology | Admitting: Cardiology

## 2022-09-24 ENCOUNTER — Other Ambulatory Visit (HOSPITAL_COMMUNITY): Payer: Self-pay

## 2022-09-24 VITALS — BP 112/70 | HR 84 | Wt 178.6 lb

## 2022-09-24 DIAGNOSIS — Z79899 Other long term (current) drug therapy: Secondary | ICD-10-CM | POA: Insufficient documentation

## 2022-09-24 DIAGNOSIS — R0602 Shortness of breath: Secondary | ICD-10-CM | POA: Insufficient documentation

## 2022-09-24 DIAGNOSIS — I2510 Atherosclerotic heart disease of native coronary artery without angina pectoris: Secondary | ICD-10-CM | POA: Diagnosis not present

## 2022-09-24 DIAGNOSIS — D696 Thrombocytopenia, unspecified: Secondary | ICD-10-CM | POA: Diagnosis not present

## 2022-09-24 DIAGNOSIS — R0601 Orthopnea: Secondary | ICD-10-CM | POA: Diagnosis not present

## 2022-09-24 DIAGNOSIS — I517 Cardiomegaly: Secondary | ICD-10-CM | POA: Insufficient documentation

## 2022-09-24 DIAGNOSIS — I4819 Other persistent atrial fibrillation: Secondary | ICD-10-CM | POA: Diagnosis not present

## 2022-09-24 DIAGNOSIS — I5022 Chronic systolic (congestive) heart failure: Secondary | ICD-10-CM | POA: Diagnosis not present

## 2022-09-24 DIAGNOSIS — Z7901 Long term (current) use of anticoagulants: Secondary | ICD-10-CM | POA: Diagnosis not present

## 2022-09-24 LAB — BASIC METABOLIC PANEL
Anion gap: 9 (ref 5–15)
BUN: 14 mg/dL (ref 8–23)
CO2: 27 mmol/L (ref 22–32)
Calcium: 8.5 mg/dL — ABNORMAL LOW (ref 8.9–10.3)
Chloride: 104 mmol/L (ref 98–111)
Creatinine, Ser: 1.24 mg/dL (ref 0.61–1.24)
GFR, Estimated: 57 mL/min — ABNORMAL LOW (ref >60)
Glucose, Bld: 94 mg/dL (ref 70–99)
Potassium: 4.2 mmol/L (ref 3.5–5.1)
Sodium: 140 mmol/L (ref 135–145)

## 2022-09-24 LAB — BRAIN NATRIURETIC PEPTIDE: B Natriuretic Peptide: 516.7 pg/mL — ABNORMAL HIGH (ref 0.0–100.0)

## 2022-09-24 LAB — CBC
HCT: 38.1 % — ABNORMAL LOW (ref 39.0–52.0)
Hemoglobin: 12.3 g/dL — ABNORMAL LOW (ref 13.0–17.0)
MCH: 27.6 pg (ref 26.0–34.0)
MCHC: 32.3 g/dL (ref 30.0–36.0)
MCV: 85.6 fL (ref 80.0–100.0)
Platelets: 96 10*3/uL — ABNORMAL LOW (ref 150–400)
RBC: 4.45 MIL/uL (ref 4.22–5.81)
RDW: 16.9 % — ABNORMAL HIGH (ref 11.5–15.5)
WBC: 8.8 10*3/uL (ref 4.0–10.5)
nRBC: 0 % (ref 0.0–0.2)

## 2022-09-24 MED ORDER — METOPROLOL SUCCINATE ER 25 MG PO TB24: 25.0000 mg | ORAL_TABLET | Freq: Every day | ORAL | 3 refills | Status: DC

## 2022-09-24 MED ORDER — FUROSEMIDE 20 MG PO TABS: 20.0000 mg | ORAL_TABLET | ORAL | 3 refills | Status: DC

## 2022-09-24 MED ORDER — EMPAGLIFLOZIN 10 MG PO TABS: 10.0000 mg | ORAL_TABLET | Freq: Every day | ORAL | 11 refills | Status: DC

## 2022-09-24 NOTE — Progress Notes (Signed)
PCP: Benita Stabile, MD Cardiology: Dr. Diona Browner HF Cardiology: Dr. Shirlee Latch  82 y.o. with history of persistent atrial fibrillation, CAD, and CHF was referred to HF clinic by Dr. Diona Browner for evaluation of CHF.  He had a DES placed in the LAD in 2/10.  He has a hepaticojejunostomy post-complicated cholecystectomy with common bile duct rupture and chronic hepatic abscess. He has had recurrent cholangitis and recurrent biliary strictures.  He reports worsening dyspnea for several months. Echo in 11/23 showed EF 40-45%, mild LV dilation, mild LVH, mildly decreased RV systolic function, mild MR, mild-moderate TR. He has been noted to be in atrial fibrillation since 11/23. Energy level is poor.  He is short of breath walking 50 feet or with any moderate activity. Very short of breath with stairs.  No chest pain. He has orthopnea (has to lie on his side) and bendopnea. No BRBPR/melena.  He has not been anticoagulated in the past due to low platelets (his most recent plts are 106K). He has a history of orthostatic hypotension. Rare lightheadedness, no syncope or falls.  He has midodrine for prn use but has not needed. He is taking Lasix about once a week.   ECG (personally reviewed): atrial fibrillation with nonspecific T wave flattening  Labs (3/24): LDL 78, plts 106K, K 4, creatinine 1.28  PMH: 1. CAD: DES to LAD in 2/10.  - Cardiolite (5/23): EF 48%, no ischemia/infarction.  2. COPD 3. BPH 4. Hyperlipidemia 5. Melanoma 6. Atrial fibrillation: Persistent 7. Chronic thrombocytopenia 8. OSA 9. Chronic dysphagia 10. Biliary system disease: Complicated cholecystectomy with rupture of common bile duct, chronic hepatic abscess after this.   - Hepaticojejunostomy with recurrent cholangitis.  - Recurrent biliary stricture requiring dilations.  11. Chronic HF with mid range EF: Echo (2/18) with EF 55-60%.  Echo (11/23) with EF 40-45%, mild LV dilation, mild LVH, mildly decreased RV systolic function, mild MR,  mild-moderate TR.   SH: Married, lives [place] farm near [place], nonsmoker, no ETOH.   Family History  Problem Relation Age of Onset   Colon cancer Sister    Heart disease Mother    Emphysema Father    ROS: All systems reviewed and negative except as per HPI.  Current Outpatient Medications  Medication Sig Dispense Refill   amoxicillin-clavulanate (AUGMENTIN) 875-125 MG tablet Take 2,000 mg by mouth as needed. For Dental Procedures     Amoxicillin-Pot Clavulanate (AUGMENTIN PO) Take 875 mg by mouth 2 (two) times daily.     cephALEXin (KEFLEX) 500 MG capsule Take 500 mg by mouth 2 (two) times daily.     ciprofloxacin (CIPRO) 500 MG tablet Take 1 tablet by mouth as directed. Patient will take 1 tablet twice a day for 1 month; current regimen is Bactrim - will start Cipro in April     cycloSPORINE (RESTASIS OP) Apply 1 drop to eye as needed.     empagliflozin (JARDIANCE) 10 MG TABS tablet Take 1 tablet (10 mg total) by mouth daily before breakfast. 30 tablet 11   finasteride (PROSCAR) 5 MG tablet Take 5 mg by mouth daily.     metoprolol succinate (TOPROL XL) 25 MG 24 hr tablet Take 1 tablet (25 mg total) by mouth daily. 90 tablet 3   midodrine (PROAMATINE) 2.5 MG tablet Take 1 tablet (2.5 mg total) by mouth 2 (two) times daily with a meal. 180 tablet 3   mirtazapine (REMERON) 7.5 MG tablet Take 7.5 mg by mouth at bedtime.     Multiple Vitamin (MULITIVITAMIN  WITH MINERALS) TABS Take 1 tablet by mouth daily. Centrum Silver men 50+     Multiple Vitamins-Minerals (PRESERVISION AREDS) CAPS Take 1 capsule by mouth in the morning and at bedtime.      naproxen sodium (ALEVE) 220 MG tablet Take 220 mg by mouth 2 (two) times daily as needed (pain).      nitroGLYCERIN (NITROSTAT) 0.4 MG SL tablet Place 0.4 mg under the tongue every 5 (five) minutes as needed for chest pain.     Oxymetazoline HCl (AFRIN NASAL SPRAY NA) Place 2 sprays into the nose 2 (two) times daily as needed.      pantoprazole  (PROTONIX) 40 MG tablet Take 40 mg by mouth at bedtime.      potassium chloride (KLOR-CON M) 10 MEQ tablet Take 10 mEq by mouth as needed.     pravastatin (PRAVACHOL) 20 MG tablet TAKE 1 TABLET(20 MG) BY MOUTH DAILY 90 tablet 1   Probiotic Product (PROBIOTIC DAILY PO) Take 1 tablet by mouth daily.     sodium chloride flush 0.9 % SOLN injection Use 10 mLs by Intracatheter route 3 (three) times daily. 2700 mL 0   tamsulosin (FLOMAX) 0.4 MG CAPS capsule Take 0.4 mg by mouth in the morning and at bedtime.   3   ursodiol (ACTIGALL) 250 MG tablet TAKE 1 TABLET(250 MG) BY MOUTH IN THE MORNING AND AT BEDTIME 120 tablet 5   furosemide (LASIX) 20 MG tablet Take 1 tablet (20 mg total) by mouth every other day. 90 tablet 3   No current facility-administered medications for this encounter.   BP 112/70   Pulse 84   Wt 81 kg (178 lb 9.6 oz)   SpO2 95%   BMI 24.91 kg/m  General: NAD Neck: JVP 8-9 cm, no thyromegaly or thyroid nodule.  Lungs: Clear to auscultation bilaterally with normal respiratory effort. CV: Nondisplaced PMI.  Heart irregular S1/S2, no S3/S4, no murmur.  1+ edema to knees.  No carotid bruit.  Normal pedal pulses.  Abdomen: Soft, nontender, no hepatosplenomegaly, no distention. Ostomy.  Skin: Intact without lesions or rashes.  Neurologic: Alert and oriented x 3.  Psych: Normal affect. Extremities: No clubbing or cyanosis.  HEENT: Normal.   Assessment/Plan: 1. Chronic HF with mid-range EF: Echo (11/23) with EF 40-45%, mild LV dilation, mild LVH, mildly decreased RV systolic function, mild MR, mild-moderate TR.  Cardiolite in 5/23 with no ischemia or infarction.  The onset of atrial fibrillation may have triggered CHF exacerbation.  He has had NYHA class III symptoms for a number of months now.  He is volume overloaded on exam.  - Start Farxiga 10 mg daily.  BMET/BNP today and again in 10 days.  - He is only taking Lasix prn.  For now, start Lasix 20 mg every other day.  - Stop  metoprolol tartrate, start Toprol XL 25 mg daily.  - Think we need to get him back into NSR, see below.  - He has a repeat echo planned for 10/01/22.  - history of orthostatic hypotension, but has not needed midodrine recently.  2. Atrial fibrillation: Persistent.  He has been in atrial fibrillation since 11/23.  This seems to coincide with worsening of CHF symptoms. He has not been anticoagulated due to low platelets. However, looking back, his platelets seem to range 80K-100K which is not markedly low.  I think he would benefit from resumption of NSR.  - As above, stop metoprolol tartrate and start Toprol XL 25 mg daily.  -  He is going to have a biliary stent placed for recurrent stricture on 10/03/22. Would not anticoagulate prior to this.  However, would like to start apixaban after this procedure.  His platelet level is not markedly low and I think he could tolerate it. Will check CBC today and will review plan with Dr. Diona Browner.  - After he is started on apixaban, I will arrange for TEE-guided DCCV to go ahead and get him back in NSR as soon as we can.   3. Recurrent biliary stricture with hepaticojejunostomy: Plan for biliary stent placement 10/01/22.  4. CAD: DES to LAD in 2/10.  No chest pain.  Cardiolite in 5/23 showed no ischemia.  - Continue pravastatin, lipids acceptable in 3/24.   Followup in 2-3 months after biliary procedure.   Alexander Burton 09/24/2022

## 2022-09-24 NOTE — Patient Instructions (Signed)
STOP Metoprolol Tartrate  START Toprol XL 25 mg daily.  START Farxiga 10 mg daily.  Labs done today, your results will be available in MyChart, we will contact you for abnormal readings.  Repeat blood work in 10 days  Your physician recommends that you schedule a follow-up appointment in: as scheduled  If you have any questions or concerns before your next appointment please send [place] a message through [place] or call our office at [phone].    TO LEAVE A MESSAGE FOR THE NURSE SELECT OPTION 2, PLEASE LEAVE A MESSAGE INCLUDING: YOUR NAME DATE OF BIRTH CALL BACK NUMBER REASON FOR CALL**this is important as we prioritize the call backs  YOU WILL RECEIVE A CALL BACK THE SAME DAY AS LONG AS YOU CALL BEFORE 4:00 PM  At the [facility], you and your health needs are our priority. As part of our continuing mission to provide you with exceptional heart care, we have created designated Provider Care Teams. These Care Teams include your primary Cardiologist (physician) and Advanced Practice Providers (APPs- Physician Assistants and Nurse Practitioners) who all work together to provide you with the care you need, when you need it.   You may see any of the following providers on your designated Care Team at your next follow up: Dr Arvilla Meres Dr [name] Dr. Marcos Eke, NP Robbie Lis, [place] [facility] [place], [place] Brynda Peon, NP Karle Plumber, PharmD   Please be sure to bring in all your medications bottles to every appointment.    Thank you for choosing [facility]

## 2022-10-01 ENCOUNTER — Ambulatory Visit (HOSPITAL_COMMUNITY)
Admission: RE | Admit: 2022-10-01 | Discharge: 2022-10-01 | Disposition: A | Discharge status: Home / Self Care | Payer: Medicare Other | Source: Ambulatory Visit | Attending: Cardiology | Admitting: Cardiology

## 2022-10-01 DIAGNOSIS — I5022 Chronic systolic (congestive) heart failure: Secondary | ICD-10-CM | POA: Diagnosis not present

## 2022-10-01 DIAGNOSIS — I428 Other cardiomyopathies: Secondary | ICD-10-CM

## 2022-10-01 LAB — ECHOCARDIOGRAM COMPLETE
Area-P 1/2: 5.23 cm2
Calc EF: 34.5 %
Est EF: 35
MV M vel: 4.7 m/s
MV Peak grad: 88.4 mmHg
S' Lateral: 4.7 cm
Single Plane A2C EF: 31 %
Single Plane A4C EF: 40 %

## 2022-10-01 NOTE — Progress Notes (Signed)
*  PRELIMINARY RESULTS* Echocardiogram 2D Echocardiogram has been performed.  Stacey Drain 10/01/2022, 12:33 PM

## 2022-10-03 ENCOUNTER — Telehealth: Payer: Self-pay | Admitting: Cardiology

## 2022-10-03 DIAGNOSIS — K8309 Other cholangitis: Secondary | ICD-10-CM | POA: Diagnosis not present

## 2022-10-03 DIAGNOSIS — I2510 Atherosclerotic heart disease of native coronary artery without angina pectoris: Secondary | ICD-10-CM | POA: Diagnosis not present

## 2022-10-03 DIAGNOSIS — Z91041 Radiographic dye allergy status: Secondary | ICD-10-CM | POA: Diagnosis not present

## 2022-10-03 DIAGNOSIS — Z79899 Other long term (current) drug therapy: Secondary | ICD-10-CM | POA: Diagnosis not present

## 2022-10-03 DIAGNOSIS — I10 Essential (primary) hypertension: Secondary | ICD-10-CM | POA: Diagnosis not present

## 2022-10-03 DIAGNOSIS — Z955 Presence of coronary angioplasty implant and graft: Secondary | ICD-10-CM | POA: Diagnosis not present

## 2022-10-03 DIAGNOSIS — Z4659 Encounter for fitting and adjustment of other gastrointestinal appliance and device: Secondary | ICD-10-CM | POA: Diagnosis not present

## 2022-10-03 DIAGNOSIS — I4891 Unspecified atrial fibrillation: Secondary | ICD-10-CM | POA: Diagnosis not present

## 2022-10-03 DIAGNOSIS — G4733 Obstructive sleep apnea (adult) (pediatric): Secondary | ICD-10-CM | POA: Diagnosis not present

## 2022-10-03 DIAGNOSIS — K831 Obstruction of bile duct: Secondary | ICD-10-CM | POA: Diagnosis not present

## 2022-10-03 NOTE — Telephone Encounter (Signed)
Pt's wife returning call to nurse from yesterday regarding results. Please advise

## 2022-10-03 NOTE — Telephone Encounter (Signed)
Jonelle Sidle, MD 10/02/2022  4:29 PM EDT     Results reviewed.  LVEF approximately 35% at this point, decreased somewhat in comparison to the prior study.  Significant biatrial enlargement also noted.  Patient was recently seen by Dr. Shirlee Latch in the heart failure clinic with plan for TEE guided cardioversion to see if restoration of sinus rhythm may be beneficial in terms of his cardiomyopathy management.

## 2022-10-03 NOTE — Telephone Encounter (Signed)
Patient's wife notified and verbalized understanding. Pt's wife had no questions or concerns at this time.  

## 2022-10-04 DIAGNOSIS — K831 Obstruction of bile duct: Secondary | ICD-10-CM | POA: Diagnosis not present

## 2022-10-04 DIAGNOSIS — I2510 Atherosclerotic heart disease of native coronary artery without angina pectoris: Secondary | ICD-10-CM | POA: Diagnosis not present

## 2022-10-04 DIAGNOSIS — G4733 Obstructive sleep apnea (adult) (pediatric): Secondary | ICD-10-CM | POA: Diagnosis not present

## 2022-10-04 DIAGNOSIS — I4891 Unspecified atrial fibrillation: Secondary | ICD-10-CM | POA: Diagnosis not present

## 2022-10-04 DIAGNOSIS — K8309 Other cholangitis: Secondary | ICD-10-CM | POA: Diagnosis not present

## 2022-10-04 DIAGNOSIS — I10 Essential (primary) hypertension: Secondary | ICD-10-CM | POA: Diagnosis not present

## 2022-10-05 ENCOUNTER — Other Ambulatory Visit (HOSPITAL_COMMUNITY)
Admission: RE | Admit: 2022-10-05 | Discharge: 2022-10-05 | Disposition: A | Discharge status: Home / Self Care | Payer: Medicare Other | Source: Ambulatory Visit | Attending: Cardiology | Admitting: Cardiology

## 2022-10-05 ENCOUNTER — Telehealth: Payer: Self-pay | Admitting: Cardiology

## 2022-10-05 DIAGNOSIS — I5022 Chronic systolic (congestive) heart failure: Secondary | ICD-10-CM | POA: Diagnosis not present

## 2022-10-05 LAB — BASIC METABOLIC PANEL
Anion gap: 8 (ref 5–15)
BUN: 26 mg/dL — ABNORMAL HIGH (ref 8–23)
CO2: 25 mmol/L (ref 22–32)
Calcium: 8.3 mg/dL — ABNORMAL LOW (ref 8.9–10.3)
Chloride: 106 mmol/L (ref 98–111)
Creatinine, Ser: 1.27 mg/dL — ABNORMAL HIGH (ref 0.61–1.24)
GFR, Estimated: 56 mL/min — ABNORMAL LOW (ref >60)
Glucose, Bld: 96 mg/dL (ref 70–99)
Potassium: 4.1 mmol/L (ref 3.5–5.1)
Sodium: 139 mmol/L (ref 135–145)

## 2022-10-05 NOTE — Telephone Encounter (Signed)
Spoke to pt/wife in office who requested to reschedule appt with gen card provider. Pt is to see Dr. Shirlee Latch in [place] on 5/20 for discussion of TEE guided DCCV. Pt also seeing provider at [facility] every 4 months d/t stents being placed. R/S pt for 08/30 with provider- did not want to see APP- first available. Will route to provider- FYI

## 2022-10-05 NOTE — Telephone Encounter (Signed)
Pt and wife came in office today and stated that the PT has an appt w/Dr. Diona Browner next week and one with Dr. Shirlee Latch on the 20th. She stated that they are running the same tests and she does not believe the PT needs to keep the appt w/Dr. Diona Browner and they are just wanting confirmation. Please call wife Devvin Zwicker [phone]

## 2022-10-09 ENCOUNTER — Ambulatory Visit: Payer: Medicare Other | Admitting: Cardiology

## 2022-10-15 ENCOUNTER — Encounter (HOSPITAL_COMMUNITY): Payer: Self-pay | Admitting: Cardiology

## 2022-10-15 ENCOUNTER — Ambulatory Visit (HOSPITAL_COMMUNITY)
Admission: RE | Admit: 2022-10-15 | Discharge: 2022-10-15 | Disposition: A | Discharge status: Home / Self Care | Payer: Medicare Other | Source: Ambulatory Visit | Attending: Cardiology | Admitting: Cardiology

## 2022-10-15 ENCOUNTER — Other Ambulatory Visit (HOSPITAL_COMMUNITY): Payer: Self-pay

## 2022-10-15 VITALS — BP 100/60 | HR 79 | Wt 173.8 lb

## 2022-10-15 DIAGNOSIS — Z955 Presence of coronary angioplasty implant and graft: Secondary | ICD-10-CM | POA: Diagnosis not present

## 2022-10-15 DIAGNOSIS — Z7984 Long term (current) use of oral hypoglycemic drugs: Secondary | ICD-10-CM | POA: Diagnosis not present

## 2022-10-15 DIAGNOSIS — K831 Obstruction of bile duct: Secondary | ICD-10-CM | POA: Insufficient documentation

## 2022-10-15 DIAGNOSIS — R0602 Shortness of breath: Secondary | ICD-10-CM | POA: Insufficient documentation

## 2022-10-15 DIAGNOSIS — Z79899 Other long term (current) drug therapy: Secondary | ICD-10-CM | POA: Insufficient documentation

## 2022-10-15 DIAGNOSIS — I2510 Atherosclerotic heart disease of native coronary artery without angina pectoris: Secondary | ICD-10-CM | POA: Insufficient documentation

## 2022-10-15 DIAGNOSIS — I480 Paroxysmal atrial fibrillation: Secondary | ICD-10-CM | POA: Diagnosis not present

## 2022-10-15 DIAGNOSIS — I517 Cardiomegaly: Secondary | ICD-10-CM | POA: Insufficient documentation

## 2022-10-15 DIAGNOSIS — I4819 Other persistent atrial fibrillation: Secondary | ICD-10-CM | POA: Diagnosis not present

## 2022-10-15 MED ORDER — SPIRONOLACTONE 25 MG PO TABS: 12.5000 mg | ORAL_TABLET | Freq: Every day | ORAL | 3 refills | Status: DC

## 2022-10-15 MED ORDER — AMIODARONE HCL 200 MG PO TABS: ORAL_TABLET | ORAL | 3 refills | Status: DC

## 2022-10-15 MED ORDER — APIXABAN 5 MG PO TABS: 5.0000 mg | ORAL_TABLET | Freq: Two times a day (BID) | ORAL | 11 refills | Status: DC

## 2022-10-15 MED ORDER — FUROSEMIDE 20 MG PO TABS: 20.0000 mg | ORAL_TABLET | Freq: Every day | ORAL | 3 refills | Status: DC

## 2022-10-15 NOTE — Patient Instructions (Addendum)
START Eliquis 5 mg Twice daily  START Amiodarone 200 mg Twice daily for 10 days, then 200 mg daily after that.  START Spironolactone 12.5 mg (1/2 Tab) daily   INCREASE Lasix to 20 mg daily.  Lab work in 10 days.  You are scheduled for a Cardioversion on Monday, June 24 with Dr. Shirlee Latch.  Please arrive at the [facility] (Main Entrance A) at [facility]: [address] [place], [place] [address] at 9:00 AM (This time is 1 hour(s) before your procedure to ensure your preparation). Free valet parking service is available. You will check in at ADMITTING. The support person will be asked to wait in the waiting room.  It is OK to have someone drop you off and come back when you are ready to be discharged.      DIET:  Nothing to eat or drink after midnight except a sip of water with medications (see medication instructions below)  MEDICATION INSTRUCTIONS:  HOLD SPIRONOLACTONE AND LASIX THE MORNING OF PROCEDURE.       Continue taking your anticoagulant (blood thinner): Apixaban (Eliquis).  You will need to continue this after your procedure until you are told by your provider that it is safe to stop.    FYI:  For your safety, and to allow [place] to monitor your vital signs accurately during the surgery/procedure we request: If you have artificial nails, gel coating, SNS etc, please have those removed prior to your surgery/procedure. Not having the nail coverings /polish removed may result in cancellation or delay of your surgery/procedure.  You must have a responsible person to drive you home and stay in the waiting area during your procedure. Failure to do so could result in cancellation.  Bring your insurance cards.  *Special Note: Every effort is made to have your procedure done on time. Occasionally there are emergencies that occur at the hospital that may cause delays. Please be patient if a delay does occur.   Your physician recommends that you schedule a follow-up appointment in: 6  weeks  If you have any questions or concerns before your next appointment please send [place] a message through [place] or call our office at [phone].    TO LEAVE A MESSAGE FOR THE NURSE SELECT OPTION 2, PLEASE LEAVE A MESSAGE INCLUDING: YOUR NAME DATE OF BIRTH CALL BACK NUMBER REASON FOR CALL**this is important as we prioritize the call backs  YOU WILL RECEIVE A CALL BACK THE SAME DAY AS LONG AS YOU CALL BEFORE 4:00 PM  At the [facility], you and your health needs are our priority. As part of our continuing mission to provide you with exceptional heart care, we have created designated Provider Care Teams. These Care Teams include your primary Cardiologist (physician) and Advanced Practice Providers (APPs- Physician Assistants and Nurse Practitioners) who all work together to provide you with the care you need, when you need it.   You may see any of the following providers on your designated Care Team at your next follow up: Dr Arvilla Meres Dr [name] Dr. Marcos Eke, NP Robbie Lis, [place] [facility] [place], [place] Brynda Peon, NP Karle Plumber, PharmD   Please be sure to bring in all your medications bottles to every appointment.    Thank you for choosing  [facility]

## 2022-10-16 NOTE — Progress Notes (Signed)
PCP: Benita Stabile, MD Cardiology: Dr. Diona Browner HF Cardiology: Dr. Shirlee Latch  82 y.o. with history of persistent atrial fibrillation, CAD, and CHF was referred to HF clinic by Dr. Diona Browner for evaluation of CHF.  He had a DES placed in the LAD in 2/10.  He has a hepaticojejunostomy post-complicated cholecystectomy with common bile duct rupture and chronic hepatic abscess. He has had recurrent cholangitis and recurrent biliary strictures.  He reports worsening dyspnea for several months. Echo in 11/23 showed EF 40-45%, mild LV dilation, mild LVH, mildly decreased RV systolic function, mild MR, mild-moderate TR. He has been noted to be in atrial fibrillation since 11/23.  He has not been anticoagulated in the past due to low platelets though recently they have been running in the 80s-100s. He has a history of orthostatic hypotension.  Since last appointment, patient had bile duct stent exchange. Echo in 5/24 showed EF 35%, global hypokinesis, mild LVH, mild RV enlargement and mildly decreased systolic function, severe biatrial enlargement.   Patient returns for followup of CHF.  He is short of breath with stairs or walking long distances.  On Lasix, weight is down 5 lbs.  No chest pain.  No lightheadedness.  No longer taking midodrine and BP is stable.  Poor energy level overall.  No orthopnea/PND.   ECG (personally reviewed): atrial fibrillation rate 81  Labs (3/24): LDL 78, plts 106K, K 4, creatinine 1.28 Labs (5/24): K 4.1, creatinine 1.27, plts 84   PMH: 1. CAD: DES to LAD in 2/10.  - Cardiolite (5/23): EF 48%, no ischemia/infarction.  2. COPD 3. BPH 4. Hyperlipidemia 5. Melanoma 6. Atrial fibrillation: Persistent 7. Chronic thrombocytopenia 8. OSA 9. Chronic dysphagia 10. Biliary system disease: Complicated cholecystectomy with rupture of common bile duct, chronic hepatic abscess after this.   - Hepaticojejunostomy with recurrent cholangitis.  - Recurrent biliary stricture requiring  dilations.  11. Chronic HF with mid range EF: Echo (2/18) with EF 55-60%.  Echo (11/23) with EF 40-45%, mild LV dilation, mild LVH, mildly decreased RV systolic function, mild MR, mild-moderate TR.  - Echo (5/24): EF 35%, global hypokinesis, mild LVH, mild RV enlargement and mildly decreased systolic function, severe biatrial enlargement.   SH: Married, lives on farm near [place], nonsmoker, no ETOH.   Family History  Problem Relation Age of Onset   Colon cancer Sister    Heart disease Mother    Emphysema Father    ROS: All systems reviewed and negative except as per HPI.  Current Outpatient Medications  Medication Sig Dispense Refill   amiodarone (PACERONE) 200 MG tablet Take 1 tablet (200 mg total) by mouth 2 (two) times daily for 10 days, THEN 1 tablet (200 mg total) daily. 100 tablet 3   amoxicillin-clavulanate (AUGMENTIN) 875-125 MG tablet Take 2,000 mg by mouth as needed. For Dental Procedures     Amoxicillin-Pot Clavulanate (AUGMENTIN PO) Take 875 mg by mouth 2 (two) times daily.     apixaban (ELIQUIS) 5 MG TABS tablet Take 1 tablet (5 mg total) by mouth 2 (two) times daily. 60 tablet 11   cephALEXin (KEFLEX) 500 MG capsule Take 500 mg by mouth 2 (two) times daily.     ciprofloxacin (CIPRO) 500 MG tablet Take 1 tablet by mouth as directed. Patient will take 1 tablet twice a day for 1 month; current regimen is Bactrim - will start Cipro in April     cycloSPORINE (RESTASIS OP) Apply 1 drop to eye as needed.     empagliflozin (JARDIANCE)  10 MG TABS tablet Take 1 tablet (10 mg total) by mouth daily before breakfast. 30 tablet 11   finasteride (PROSCAR) 5 MG tablet Take 5 mg by mouth daily.     metoprolol succinate (TOPROL XL) 25 MG 24 hr tablet Take 1 tablet (25 mg total) by mouth daily. 90 tablet 3   midodrine (PROAMATINE) 2.5 MG tablet Take 1 tablet (2.5 mg total) by mouth 2 (two) times daily with a meal. 180 tablet 3   mirtazapine (REMERON) 7.5 MG tablet Take 7.5 mg by mouth at  bedtime.     Multiple Vitamin (MULITIVITAMIN WITH MINERALS) TABS Take 1 tablet by mouth daily. Centrum Silver men 50+     Multiple Vitamins-Minerals (PRESERVISION AREDS) CAPS Take 1 capsule by mouth in the morning and at bedtime.      naproxen sodium (ALEVE) 220 MG tablet Take 220 mg by mouth 2 (two) times daily as needed (pain).      nitroGLYCERIN (NITROSTAT) 0.4 MG SL tablet Place 0.4 mg under the tongue every 5 (five) minutes as needed for chest pain.     Oxymetazoline HCl (AFRIN NASAL SPRAY NA) Place 2 sprays into the nose 2 (two) times daily as needed.      pantoprazole (PROTONIX) 40 MG tablet Take 40 mg by mouth at bedtime.      potassium chloride (KLOR-CON M) 10 MEQ tablet Take 10 mEq by mouth as needed.     pravastatin (PRAVACHOL) 20 MG tablet TAKE 1 TABLET(20 MG) BY MOUTH DAILY 90 tablet 1   Probiotic Product (PROBIOTIC DAILY PO) Take 1 tablet by mouth daily.     sodium chloride flush 0.9 % SOLN injection Use 10 mLs by Intracatheter route 3 (three) times daily. 2700 mL 0   spironolactone (ALDACTONE) 25 MG tablet Take 0.5 tablets (12.5 mg total) by mouth daily. 45 tablet 3   tamsulosin (FLOMAX) 0.4 MG CAPS capsule Take 0.4 mg by mouth in the morning and at bedtime.   3   ursodiol (ACTIGALL) 250 MG tablet TAKE 1 TABLET(250 MG) BY MOUTH IN THE MORNING AND AT BEDTIME 120 tablet 5   furosemide (LASIX) 20 MG tablet Take 1 tablet (20 mg total) by mouth daily. 90 tablet 3   No current facility-administered medications for this encounter.   BP 100/60   Pulse 79   Wt 78.8 kg (173 lb 12.8 oz)   SpO2 96%   BMI 24.24 kg/m  General: NAD Neck: JVP 8 cm, no thyromegaly or thyroid nodule.  Lungs: Clear to auscultation bilaterally with normal respiratory effort. CV: Nondisplaced PMI.  Heart irregular S1/S2, no S3/S4, no murmur.  2+ edema 1/2 to knees.  No carotid bruit.  Normal pedal pulses.  Abdomen: Soft, nontender, no hepatosplenomegaly, no distention.  Skin: Intact without lesions or rashes.   Neurologic: Alert and oriented x 3.  Psych: Normal affect. Extremities: No clubbing or cyanosis.  HEENT: Normal.   Assessment/Plan: 1. Chronic HF with mid-range EF: Echo (11/23) with EF 40-45%, mild LV dilation, mild LVH, mildly decreased RV systolic function, mild MR, mild-moderate TR.  Cardiolite in 5/23 with no ischemia or infarction.  Echo in 5/24 with EF 35%, global hypokinesis, mild LVH, mild RV enlargement and mildly decreased systolic function, severe biatrial enlargement.  The onset of atrial fibrillation may have triggered CHF worsening.  He has had no chest pain.  He has had NYHA class III symptoms for a number of months now.  He is still volume overloaded on exam but weight down  after starting Lasix.  - Continue Jardiance 10 mg daily.   - Increase Lasix to 20 mg daily.  BMET in 10 days.   - Continue Toprol XL 25 mg daily.  - Add spironolactone 12.5 daily.  - Think we need to get him back into NSR, see below.  - history of orthostatic hypotension, but has not needed midodrine recently.  2. Atrial fibrillation: Persistent.  He has been in atrial fibrillation since 11/23.  This seems to coincide with worsening of CHF symptoms. He has not been anticoagulated due to low platelets. However, looking back, his platelets seem to range 80K-100K which is not markedly low.  I think he would benefit from resumption of NSR.  - Rate is controlled on Toprol XL 25 mg daily.  - He has had his biliary stent replaced, will now start him on apixaban 5 mg bid.  - He has been in AF for > 6 months now and atria are dilated.  I suspect he will need amiodarone to keep him in rhythm after DCCV.  Will start amiodarone 200 mg bid x 10 days then 200 mg daily.   - I will plan for DCCV in about a month after amiodarone load.  No TEE as long as he does not miss Eliquis.    3. Recurrent biliary stricture with hepaticojejunostomy: Had recent biliary stent replaced.   4. CAD: DES to LAD in 2/10.  No chest pain.   Cardiolite in 5/23 showed no ischemia. EF is lower but may be related to AF, will hold off on cath for now with no CP.  - Continue pravastatin, lipids acceptable in 3/24.   Followup in 6 wks with me after DCCV.   [name] 10/16/2022

## 2022-10-17 ENCOUNTER — Other Ambulatory Visit (HOSPITAL_COMMUNITY): Payer: Medicare Other

## 2022-10-24 ENCOUNTER — Other Ambulatory Visit (HOSPITAL_COMMUNITY): Payer: Self-pay

## 2022-10-25 ENCOUNTER — Other Ambulatory Visit (HOSPITAL_COMMUNITY): Payer: Self-pay

## 2022-10-25 ENCOUNTER — Ambulatory Visit (HOSPITAL_COMMUNITY)
Admission: RE | Admit: 2022-10-25 | Discharge: 2022-10-25 | Disposition: A | Discharge status: Home / Self Care | Payer: Medicare Other | Source: Ambulatory Visit | Attending: Internal Medicine | Admitting: Internal Medicine

## 2022-10-25 DIAGNOSIS — I480 Paroxysmal atrial fibrillation: Secondary | ICD-10-CM | POA: Insufficient documentation

## 2022-10-25 LAB — BASIC METABOLIC PANEL
Anion gap: 7 (ref 5–15)
BUN: 17 mg/dL (ref 8–23)
CO2: 27 mmol/L (ref 22–32)
Calcium: 8.4 mg/dL — ABNORMAL LOW (ref 8.9–10.3)
Chloride: 104 mmol/L (ref 98–111)
Creatinine, Ser: 1.48 mg/dL — ABNORMAL HIGH (ref 0.61–1.24)
GFR, Estimated: 46 mL/min — ABNORMAL LOW (ref >60)
Glucose, Bld: 98 mg/dL (ref 70–99)
Potassium: 4 mmol/L (ref 3.5–5.1)
Sodium: 138 mmol/L (ref 135–145)

## 2022-10-25 MED ORDER — SODIUM CHLORIDE FLUSH 0.9 % IV SOLN
10.0000 mL | Freq: Three times a day (TID) | INTRAVENOUS | 2 refills | Status: DC
  Filled 2022-10-25: qty 900, 30d supply, fill #0
  Filled 2022-12-06: qty 2400, 80d supply, fill #1

## 2022-10-26 ENCOUNTER — Telehealth (HOSPITAL_COMMUNITY): Payer: Self-pay

## 2022-10-26 DIAGNOSIS — I5022 Chronic systolic (congestive) heart failure: Secondary | ICD-10-CM

## 2022-10-26 NOTE — Telephone Encounter (Signed)
Patient's wife updated on labs and medication changes.

## 2022-10-26 NOTE — Telephone Encounter (Signed)
-----   Message from Laurey Morale, MD sent at 10/25/2022  2:14 PM EDT ----- Decrease Lasix to 20 mg daily alternating with 10 mg daily.  BMET 10 days.

## 2022-11-01 ENCOUNTER — Ambulatory Visit (HOSPITAL_COMMUNITY)
Admission: RE | Admit: 2022-11-01 | Discharge: 2022-11-01 | Disposition: A | Discharge status: Home / Self Care | Payer: Medicare Other | Source: Ambulatory Visit | Attending: Cardiology | Admitting: Cardiology

## 2022-11-01 ENCOUNTER — Ambulatory Visit: Payer: Medicare Other | Admitting: Cardiology

## 2022-11-01 DIAGNOSIS — I5022 Chronic systolic (congestive) heart failure: Secondary | ICD-10-CM | POA: Diagnosis not present

## 2022-11-01 LAB — BASIC METABOLIC PANEL
Anion gap: 9 (ref 5–15)
BUN: 23 mg/dL (ref 8–23)
CO2: 26 mmol/L (ref 22–32)
Calcium: 8.4 mg/dL — ABNORMAL LOW (ref 8.9–10.3)
Chloride: 104 mmol/L (ref 98–111)
Creatinine, Ser: 1.53 mg/dL — ABNORMAL HIGH (ref 0.61–1.24)
GFR, Estimated: 45 mL/min — ABNORMAL LOW (ref >60)
Glucose, Bld: 114 mg/dL — ABNORMAL HIGH (ref 70–99)
Potassium: 4.1 mmol/L (ref 3.5–5.1)
Sodium: 139 mmol/L (ref 135–145)

## 2022-11-16 ENCOUNTER — Telehealth (HOSPITAL_COMMUNITY): Payer: Self-pay

## 2022-11-16 NOTE — Pre-Procedure Instructions (Signed)
Spoke to patient and pt's wife on phone regarding cardioversion on monday:  Instructed to arrive at 0900, NPO after midnight  Confirmed patient has a ride home and responsible person to stay with patient for 24 hours after the procedure  Confirmed no missed doses of eliquis, instructed patient to ensure to continue doses and take on Monday AM with sip of water  Hold jardiance starting Saturday and until after procedure

## 2022-11-16 NOTE — Telephone Encounter (Signed)
Called patient to remind him of procedure scheduled for Monday. Left message to call office if any questions about procedure

## 2022-11-19 ENCOUNTER — Ambulatory Visit (HOSPITAL_COMMUNITY)
Admission: RE | Admit: 2022-11-19 | Discharge: 2022-11-19 | Disposition: A | Discharge status: Home / Self Care | Payer: Medicare Other | Attending: Cardiology | Admitting: Cardiology

## 2022-11-19 ENCOUNTER — Encounter (HOSPITAL_COMMUNITY): Payer: Self-pay | Admitting: Cardiology

## 2022-11-19 ENCOUNTER — Ambulatory Visit (HOSPITAL_BASED_OUTPATIENT_CLINIC_OR_DEPARTMENT_OTHER): Payer: Medicare Other | Admitting: Anesthesiology

## 2022-11-19 ENCOUNTER — Encounter (HOSPITAL_COMMUNITY)
Admission: RE | Disposition: A | Discharge status: Home / Self Care | Payer: Self-pay | Source: Home / Self Care | Attending: Cardiology

## 2022-11-19 ENCOUNTER — Ambulatory Visit (HOSPITAL_COMMUNITY): Payer: Medicare Other | Admitting: Anesthesiology

## 2022-11-19 ENCOUNTER — Other Ambulatory Visit: Payer: Self-pay

## 2022-11-19 DIAGNOSIS — Z87891 Personal history of nicotine dependence: Secondary | ICD-10-CM

## 2022-11-19 DIAGNOSIS — Z7901 Long term (current) use of anticoagulants: Secondary | ICD-10-CM | POA: Insufficient documentation

## 2022-11-19 DIAGNOSIS — I4819 Other persistent atrial fibrillation: Secondary | ICD-10-CM | POA: Diagnosis not present

## 2022-11-19 DIAGNOSIS — I2510 Atherosclerotic heart disease of native coronary artery without angina pectoris: Secondary | ICD-10-CM | POA: Diagnosis not present

## 2022-11-19 DIAGNOSIS — I10 Essential (primary) hypertension: Secondary | ICD-10-CM

## 2022-11-19 DIAGNOSIS — J449 Chronic obstructive pulmonary disease, unspecified: Secondary | ICD-10-CM

## 2022-11-19 DIAGNOSIS — I480 Paroxysmal atrial fibrillation: Secondary | ICD-10-CM

## 2022-11-19 DIAGNOSIS — I4891 Unspecified atrial fibrillation: Secondary | ICD-10-CM | POA: Diagnosis not present

## 2022-11-19 DIAGNOSIS — G4730 Sleep apnea, unspecified: Secondary | ICD-10-CM | POA: Insufficient documentation

## 2022-11-19 HISTORY — PX: CARDIOVERSION: SHX1299

## 2022-11-19 LAB — POCT I-STAT, CHEM 8
BUN: 18 mg/dL (ref 8–23)
Calcium, Ion: 1.17 mmol/L (ref 1.15–1.40)
Chloride: 104 mmol/L (ref 98–111)
Creatinine, Ser: 1.3 mg/dL — ABNORMAL HIGH (ref 0.61–1.24)
Glucose, Bld: 89 mg/dL (ref 70–99)
HCT: 37 % — ABNORMAL LOW (ref 39.0–52.0)
Hemoglobin: 12.6 g/dL — ABNORMAL LOW (ref 13.0–17.0)
Potassium: 4.3 mmol/L (ref 3.5–5.1)
Sodium: 141 mmol/L (ref 135–145)
TCO2: 26 mmol/L (ref 22–32)

## 2022-11-19 SURGERY — CARDIOVERSION
Anesthesia: General

## 2022-11-19 MED ORDER — PROPOFOL 10 MG/ML IV BOLUS
INTRAVENOUS | Status: DC | PRN
  Administered 2022-11-19: 50 mg via INTRAVENOUS

## 2022-11-19 MED ORDER — LIDOCAINE 2% (20 MG/ML) 5 ML SYRINGE
INTRAMUSCULAR | Status: DC | PRN
  Administered 2022-11-19: 100 mg via INTRAVENOUS

## 2022-11-19 MED ORDER — SODIUM CHLORIDE 0.9 % IV SOLN: INTRAVENOUS | Status: DC

## 2022-11-19 SURGICAL SUPPLY — 1 items: ELECT DEFIB PAD ADLT CADENCE (PAD) ×2 IMPLANT

## 2022-11-19 NOTE — H&P (Signed)
Advanced [facility] Failure Team History and Physical Note   PCP:  Benita Stabile, MD  PCP-Cardiology: Nona Dell, MD     Reason for Admission:    HPI:    Patient here for DCCV. He is in atrial fibrillation, has not missed Eliquis.    Review of Systems: [y] = yes, [ ]  = no   General: Weight gain [ ] ; Weight loss [ ] ; Anorexia [ ] ; Fatigue [ ] ; Fever [ ] ; Chills [ ] ; Weakness [ ]   Cardiac: Chest pain/pressure [ ] ; Resting SOB [ ] ; Exertional SOB [ ] ; Orthopnea [ ] ; Pedal Edema [ ] ; Palpitations [ ] ; Syncope [ ] ; Presyncope [ ] ; Paroxysmal nocturnal dyspnea[ ]   Pulmonary: Cough [ ] ; Wheezing[ ] ; Hemoptysis[ ] ; Sputum [ ] ; Snoring [ ]   GI: Vomiting[ ] ; Dysphagia[ ] ; Melena[ ] ; Hematochezia [ ] ; Heartburn[ ] ; Abdominal pain [ ] ; Constipation [ ] ; Diarrhea [ ] ; BRBPR [ ]   GU: Hematuria[ ] ; Dysuria [ ] ; Nocturia[ ]   Vascular: Pain in legs with walking [ ] ; Pain in feet with lying flat [ ] ; Non-healing sores [ ] ; Stroke [ ] ; TIA [ ] ; Slurred speech [ ] ;  Neuro: Headaches[ ] ; Vertigo[ ] ; Seizures[ ] ; Paresthesias[ ] ;Blurred vision [ ] ; Diplopia [ ] ; Vision changes [ ]   Ortho/Skin: Arthritis [ ] ; Joint pain [ ] ; Muscle pain [ ] ; Joint swelling [ ] ; Back Pain [ ] ; Rash [ ]   Psych: Depression[ ] ; Anxiety[ ]   Heme: Bleeding problems [ ] ; Clotting disorders [ ] ; Anemia [ ]   Endocrine: Diabetes [ ] ; Thyroid dysfunction[ ]    Home Medications Prior to Admission medications   Medication Sig Start Date End Date Taking? Authorizing Provider  amiodarone (PACERONE) 200 MG tablet Take 1 tablet (200 mg total) by mouth 2 (two) times daily for 10 days, THEN 1 tablet (200 mg total) daily. Patient taking differently: 1 tablet (200 mg total) daily. 10/15/22 10/14/23 Yes Laurey Morale, MD  amoxicillin-clavulanate (AUGMENTIN) 875-125 MG tablet Take 1 tablet by mouth 2 (two) times daily. 06/29/22 06/29/23 Yes [provider]  apixaban (ELIQUIS) 5 MG TABS tablet Take 1 tablet (5 mg total) by mouth 2 (two)  times daily. 10/15/22  Yes Laurey Morale, MD  empagliflozin (JARDIANCE) 10 MG TABS tablet Take 1 tablet (10 mg total) by mouth daily before breakfast. 09/24/22  Yes Laurey Morale, MD  furosemide (LASIX) 20 MG tablet Take 1 tablet (20 mg total) by mouth daily. Patient taking differently: Take 10-20 mg by mouth See admin instructions. Take 1 tablet (20 mg) by mouth once every other day after lunch, alternating with taking 0.5 tablet (10 mg) by mouth every other day after lunch. 10/15/22 10/10/23 Yes Laurey Morale, MD  metoprolol succinate (TOPROL XL) 25 MG 24 hr tablet Take 1 tablet (25 mg total) by mouth daily. 09/24/22  Yes Laurey Morale, MD  mirtazapine (REMERON) 7.5 MG tablet Take 7.5 mg by mouth at bedtime. 01/04/20  Yes [provider]  Multiple Vitamin (MULITIVITAMIN WITH MINERALS) TABS Take 1 tablet by mouth in the morning. Centrum Silver men 50+   Yes [provider]  Multiple Vitamins-Minerals (PRESERVISION AREDS) CAPS Take 1 capsule by mouth in the morning and at bedtime.    Yes [provider]  pantoprazole (PROTONIX) 40 MG tablet Take 40 mg by mouth at bedtime.  12/09/19  Yes [provider]  Polyvinyl Alcohol-Povidone PF (REFRESH) 1.4-0.6 % SOLN Place 1-2 drops into both eyes 3 (three)  times daily as needed (dry/irritated eyes).   Yes [provider]  pravastatin (PRAVACHOL) 20 MG tablet TAKE 1 TABLET(20 MG) BY MOUTH DAILY Patient taking differently: Take 20 mg by mouth at bedtime. 07/30/22  Yes Jonelle Sidle, MD  Probiotic Product (PROBIOTIC DAILY PO) Take 1 tablet by mouth in the morning.   Yes [provider]  sodium chloride flush 0.9 % SOLN injection 10 mLs by Intracatheter route 3 (three) times daily. 10/25/22  Yes   spironolactone (ALDACTONE) 25 MG tablet Take 0.5 tablets (12.5 mg total) by mouth daily. 10/15/22  Yes Laurey Morale, MD  tamsulosin (FLOMAX) 0.4 MG CAPS capsule Take 0.4 mg by mouth in the morning and at  bedtime.  06/24/15  Yes [provider]  ursodiol (ACTIGALL) 250 MG tablet TAKE 1 TABLET(250 MG) BY MOUTH IN THE MORNING AND AT BEDTIME 07/16/22  Yes Dolores Frame, MD  cephALEXin (KEFLEX) 500 MG capsule Take 500 mg by mouth 2 (two) times daily.    [provider]  ciprofloxacin (CIPRO) 500 MG tablet Take 1 tablet by mouth as directed. Patient will take 1 tablet twice a day for 1 month; current regimen is Bactrim - will start Cipro in April 07/14/19   [provider]  finasteride (PROSCAR) 5 MG tablet Take 5 mg by mouth in the morning.    [provider]  midodrine (PROAMATINE) 2.5 MG tablet Take 1 tablet (2.5 mg total) by mouth 2 (two) times daily with a meal. Patient not taking: Reported on 11/13/2022 07/19/22   Jonelle Sidle, MD  nitroGLYCERIN (NITROSTAT) 0.4 MG SL tablet Place 0.4 mg under the tongue every 5 (five) minutes as needed for chest pain.    [provider]  Oxymetazoline HCl (AFRIN NASAL SPRAY NA) Place 2 sprays into the nose 2 (two) times daily as needed.     [provider]  sodium chloride flush 0.9 % SOLN injection Use 10 mLs by Intracatheter route 3 (three) times daily. 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 Has now had multiple issues with chronic hepatic abscess. Currently has indwelling drain   COLONOSCOPY  08/16/2011   Procedure: COLONOSCOPY;  Surgeon: Malissa Hippo, MD;  [facility];  Service: Endoscopy;  Laterality: N/A;  1200   ESOPHAGEAL DILATION N/A 01/20/2015   Procedure: ESOPHAGEAL DILATION;  Surgeon: Malissa Hippo, MD;  [facility]: [facility];  Service: Endoscopy;  Laterality: N/A;   ESOPHAGEAL DILATION N/A 02/11/2020   Procedure: ESOPHAGEAL DILATION;  Surgeon: Malissa Hippo, MD;  [facility];  Service: Endoscopy;  Laterality: N/A;   ESOPHAGOGASTRODUODENOSCOPY N/A 01/20/2015   Procedure: ESOPHAGOGASTRODUODENOSCOPY (EGD);  Surgeon: Malissa Hippo, MD;  [facility];  Service: Endoscopy;  Laterality: N/A;  1200   ESOPHAGOGASTRODUODENOSCOPY N/A 02/11/2020   Procedure: ESOPHAGOGASTRODUODENOSCOPY (EGD);  Surgeon: Malissa Hippo, MD;  [facility];  Service: Endoscopy;  Laterality: N/A;  730   LEA DOPPLER  07/28/2008   NORMAL RGT GROIN DUPLEX DOPPLER   liver stent     LIVER SURGERY  09-03/14   @ [facility]   Melanoma resection      Family History:  Family History  Problem Relation Age of Onset   Colon cancer Sister    Heart disease Mother    Emphysema Father     Social History: Social History   Socioeconomic History   Marital status: Married    Spouse name: Not on file   Number of children: 2   Years of education: Not on file   Highest education level: Not on file  Occupational History    Employer: RETIRED  Tobacco Use   Smoking status: Former    Packs/day: 1.50    Years: 50.00    Additional pack years: 0.00    Total pack years: 75.00    Types: Cigarettes    Quit date: 05/29/2007    Years since quitting: 15.4   Smokeless  tobacco: Never  Vaping Use   Vaping Use: Never used  Substance and Sexual Activity   Alcohol use: Not Currently    Alcohol/week: 1.0 standard drink of alcohol    Types: 1 Standard drinks or equivalent per week    Comment: very rare   Drug use: No   Sexual activity: Not Currently  Other Topics Concern   Not on file  Social History Narrative   Married father to come a grandfather 3. 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 Has more fatigue with walking but still able to walk up to 2 miles prior to his most recent hospitalization.   Social Determinants of Health   Financial Resource Strain: Low Risk  (04/04/2020)   Overall Financial Resource Strain (CARDIA)    Difficulty of Paying Living Expenses: Not hard at all  Food Insecurity: No Food Insecurity (04/04/2020)   Hunger Vital Sign    Worried About Running Out of Food in the Last Year: Never true    Ran Out of Food in the Last Year: Never true  Transportation Needs: No Transportation Needs (04/04/2020)   PRAPARE - Administrator, Civil Service (Medical): No    Lack of Transportation (Non-Medical): No  Physical Activity: Inactive (04/04/2020)   Exercise Vital Sign    Days of Exercise per Week: 0 days    Minutes of Exercise per Session: 0 min  Stress: No Stress Concern Present (04/04/2020)   [employer] of Occupational Health - Occupational Stress Questionnaire    Feeling of Stress : Only a little  Social Connections: Moderately Integrated (04/04/2020)   Social Connection and Isolation Panel [NHANES]    Frequency of Communication with Friends and Family: More than three times a week    Frequency of Social Gatherings with Friends and Family: More than three times a week    Attends Religious Services: More than 4 times per year    Active Member of [employer] or Organizations:  No    Attends Banker Meetings: Never    Marital Status: Married    Allergies:  Allergies  Allergen Reactions   Contrast  Media [Iodinated Contrast Media]     Hives, needs pre meds   Metrizamide Hives    Hives, needs pre meds   Other Itching and Other (See Comments)    Contrast dye erythema    Objective:    Vital Signs:   Temp:  [97.8 F (36.6 C)] 97.8 F (36.6 C) (06/24 1155) Pulse Rate:  [86-90] 86 (06/24 1241) Resp:  [16-19] 16 (06/24 1241) BP: (113-123)/(79-82) 113/79 (06/24 1241) SpO2:  [96 %-97 %] 96 % (06/24 1241) Weight:  [71.7 kg] 71.7 kg (06/24 1155)   Filed Weights   11/19/22 1155  Weight: 71.7 kg     Physical Exam     General:  Well appearing. No respiratory difficulty HEENT: Normal Neck: Supple. no JVD. Carotids 2+ bilat; no bruits. No lymphadenopathy or thyromegaly appreciated. Cor: PMI nondisplaced. Irregular rate & rhythm. No rubs, gallops or murmurs. Lungs: Clear Abdomen: Soft, nontender, nondistended. No hepatosplenomegaly. No bruits or masses. Good bowel sounds. Extremities: No cyanosis, clubbing, rash, edema Neuro: Alert & oriented x 3, cranial nerves grossly intact. moves all 4 extremities w/o difficulty. Affect pleasant.  Labs     Basic Metabolic Panel: Recent Labs  Lab 11/19/22 1236  NA 141  K 4.3  CL 104  GLUCOSE 89  BUN 18  CREATININE 1.30*    Liver Function Tests: No results for input(s): "AST", "ALT", "ALKPHOS", "BILITOT", "PROT", "ALBUMIN" in the last 168 hours. No results for input(s): "LIPASE", "AMYLASE" in the last 168 hours. No results for input(s): "AMMONIA" in the last 168 hours.  CBC: Recent Labs  Lab 11/19/22 1236  HGB 12.6*  HCT 37.0*    Cardiac Enzymes: No results for input(s): "CKTOTAL", "CKMB", "CKMBINDEX", "TROPONINI" in the last 168 hours.  BNP: BNP (last 3 results) Recent Labs    09/24/22 1240  BNP 516.7*    ProBNP (last 3 results) No results for input(s): "PROBNP" in the last 8760 hours.   CBG: No results for input(s): "GLUCAP" in the last 168 hours.  Coagulation Studies: No results for input(s): "LABPROT",  "INR" in the last 72 hours.  Imaging: No results found.   Assessment/Plan   DCCV today for atrial fibrillation.    [name], MD 11/19/2022, 1:01 PM  Advanced Heart Failure Team Pager [phone] (M-F; 7a - 5p)  Please contact [facility] for night-coverage after hours (4p -7a ) and weekends on [email or web address]

## 2022-11-19 NOTE — Anesthesia Postprocedure Evaluation (Signed)
Anesthesia Post Note  Patient: Alexander Burton  Procedure(s) Performed: CARDIOVERSION     Patient location during evaluation: PACU Anesthesia Type: General Level of consciousness: awake and alert Pain management: pain level controlled Vital Signs Assessment: post-procedure vital signs reviewed and stable Respiratory status: spontaneous breathing, nonlabored ventilation, respiratory function stable and patient connected to nasal cannula oxygen Cardiovascular status: blood pressure returned to baseline and stable Postop Assessment: no apparent nausea or vomiting Anesthetic complications: no   No notable events documented.  Last Vitals:  Vitals:   11/19/22 1335 11/19/22 1340  BP: 99/70 105/65  Pulse: 61 (!) 58  Resp: 19 18  Temp:    SpO2: 96% 96%    Last Pain:  Vitals:   11/19/22 1322  TempSrc: Temporal  PainSc: 0-No pain                 Shirley Decamp

## 2022-11-19 NOTE — Procedures (Signed)
Electrical Cardioversion Procedure Note Alexander Burton [id number] 06-28-1940  Procedure: Electrical Cardioversion Indications:  Atrial Fibrillation  Procedure Details Consent: Risks of procedure as well as the alternatives and risks of each were explained to the (patient/caregiver).  Consent for procedure obtained. Time Out: Verified patient identification, verified procedure, site/side was marked, verified correct patient position, special equipment/implants available, medications/allergies/relevent history reviewed, required imaging and test results available.  Performed  Patient placed on cardiac monitor, pulse oximetry, supplemental oxygen as necessary.  Sedation given:  Propofol Pacer pads placed anterior and posterior chest.  Cardioverted 1 time(s).  Cardioverted at 200J.  Evaluation Findings: Post procedure EKG shows: NSR Complications: None Patient did tolerate procedure well.   [name] 11/19/2022, 1:13 PM

## 2022-11-19 NOTE — Anesthesia Preprocedure Evaluation (Addendum)
Anesthesia Evaluation  Patient identified by MRN, date of birth, ID band Patient awake    Reviewed: Allergy & Precautions, H&P , NPO status , Patient's Chart, lab work & pertinent test results  Airway Mallampati: II  TM Distance: >3 FB Neck ROM: Full    Dental no notable dental hx. (+) Teeth Intact, Poor Dentition, Dental Advisory Given   Pulmonary sleep apnea , COPD, former smoker   Pulmonary exam normal breath sounds clear to auscultation       Cardiovascular Exercise Tolerance: Good hypertension, + CAD  Normal cardiovascular exam+ dysrhythmias Atrial Fibrillation  Rhythm:Regular Rate:Normal  ECHO 11/23 1. Left ventricular ejection fraction, by estimation, is 35%. The left  ventricle has moderately decreased function. The left ventricle  demonstrates global hypokinesis. The left ventricular internal cavity size  was moderately dilated. There is mild  concentric left ventricular hypertrophy. Left ventricular diastolic  parameters are indeterminate.   2. Right ventricular systolic function is mildly reduced. The right  ventricular size is mildly enlarged. There is normal pulmonary artery  systolic pressure.   3. Left atrial size was severely dilated.   4. Right atrial size was severely dilated.   5. Mild mitral valve regurgitation.   6. The aortic valve is tricuspid. Aortic valve regurgitation is not  visualized.     Neuro/Psych negative neurological ROS  negative psych ROS   GI/Hepatic negative GI ROS, Neg liver ROS,GERD  Medicated,,  Endo/Other  negative endocrine ROS    Renal/GU negative Renal ROS  negative genitourinary   Musculoskeletal negative musculoskeletal ROS (+)    Abdominal   Peds negative pediatric ROS (+)  Hematology negative hematology ROS (+) Blood dyscrasia, anemia   Anesthesia Other Findings   Reproductive/Obstetrics negative OB ROS                              Anesthesia Physical Anesthesia Plan  ASA: 3  Anesthesia Plan: General   Post-op Pain Management: Minimal or no pain anticipated   Induction: Intravenous  PONV Risk Score and Plan: 2 and Treatment may vary due to age or medical condition and Propofol infusion  Airway Management Planned: Mask, Natural Airway and Simple Face Mask  Additional Equipment: None  Intra-op Plan:   Post-operative Plan:   Informed Consent: I have reviewed the patients History and Physical, chart, labs and discussed the procedure including the risks, benefits and alternatives for the proposed anesthesia with the patient or authorized representative who has indicated his/her understanding and acceptance.       Plan Discussed with: Anesthesiologist and CRNA  Anesthesia Plan Comments: (  )       Anesthesia Quick Evaluation

## 2022-11-19 NOTE — Transfer of Care (Signed)
Immediate Anesthesia Transfer of Care Note  Patient: Alexander Burton  Procedure(s) Performed: CARDIOVERSION  Patient Location: PACU and Cath Lab  Anesthesia Type:MAC  Level of Consciousness: awake and alert   Airway & Oxygen Therapy: Patient Spontanous Breathing and Patient connected to nasal cannula oxygen  Post-op Assessment: Report given to RN and Post -op Vital signs reviewed and stable  Post vital signs: Reviewed and stable  Last Vitals:  Vitals Value Taken Time  BP    Temp    Pulse    Resp    SpO2      Last Pain:  Vitals:   11/19/22 1241  TempSrc:   PainSc: 0-No pain         Complications: No notable events documented.

## 2022-11-20 ENCOUNTER — Encounter (HOSPITAL_COMMUNITY): Payer: Self-pay | Admitting: Cardiology

## 2022-11-22 DIAGNOSIS — D485 Neoplasm of uncertain behavior of skin: Secondary | ICD-10-CM | POA: Diagnosis not present

## 2022-11-22 DIAGNOSIS — Z1283 Encounter for screening for malignant neoplasm of skin: Secondary | ICD-10-CM | POA: Diagnosis not present

## 2022-11-22 DIAGNOSIS — Z08 Encounter for follow-up examination after completed treatment for malignant neoplasm: Secondary | ICD-10-CM | POA: Diagnosis not present

## 2022-11-22 DIAGNOSIS — D225 Melanocytic nevi of trunk: Secondary | ICD-10-CM | POA: Diagnosis not present

## 2022-11-22 DIAGNOSIS — L814 Other melanin hyperpigmentation: Secondary | ICD-10-CM | POA: Diagnosis not present

## 2022-11-22 DIAGNOSIS — B078 Other viral warts: Secondary | ICD-10-CM | POA: Diagnosis not present

## 2022-11-22 DIAGNOSIS — Z85820 Personal history of malignant melanoma of skin: Secondary | ICD-10-CM | POA: Diagnosis not present

## 2022-11-22 DIAGNOSIS — D2272 Melanocytic nevi of left lower limb, including hip: Secondary | ICD-10-CM | POA: Diagnosis not present

## 2022-12-06 ENCOUNTER — Other Ambulatory Visit (HOSPITAL_COMMUNITY): Payer: Self-pay

## 2022-12-06 ENCOUNTER — Ambulatory Visit (HOSPITAL_COMMUNITY)
Admission: RE | Admit: 2022-12-06 | Discharge: 2022-12-06 | Disposition: A | Discharge status: Home / Self Care | Payer: Medicare Other | Source: Ambulatory Visit | Attending: Cardiology | Admitting: Cardiology

## 2022-12-06 ENCOUNTER — Encounter (HOSPITAL_COMMUNITY): Payer: Self-pay | Admitting: Cardiology

## 2022-12-06 VITALS — BP 102/60 | HR 58 | Wt 165.8 lb

## 2022-12-06 DIAGNOSIS — I480 Paroxysmal atrial fibrillation: Secondary | ICD-10-CM

## 2022-12-06 DIAGNOSIS — I5022 Chronic systolic (congestive) heart failure: Secondary | ICD-10-CM | POA: Diagnosis not present

## 2022-12-06 DIAGNOSIS — K831 Obstruction of bile duct: Secondary | ICD-10-CM | POA: Diagnosis not present

## 2022-12-06 DIAGNOSIS — I2510 Atherosclerotic heart disease of native coronary artery without angina pectoris: Secondary | ICD-10-CM | POA: Diagnosis not present

## 2022-12-06 DIAGNOSIS — Z9049 Acquired absence of other specified parts of digestive tract: Secondary | ICD-10-CM | POA: Insufficient documentation

## 2022-12-06 DIAGNOSIS — Z955 Presence of coronary angioplasty implant and graft: Secondary | ICD-10-CM | POA: Diagnosis not present

## 2022-12-06 DIAGNOSIS — Z79899 Other long term (current) drug therapy: Secondary | ICD-10-CM | POA: Insufficient documentation

## 2022-12-06 DIAGNOSIS — Z8249 Family history of ischemic heart disease and other diseases of the circulatory system: Secondary | ICD-10-CM | POA: Diagnosis not present

## 2022-12-06 DIAGNOSIS — I4819 Other persistent atrial fibrillation: Secondary | ICD-10-CM | POA: Diagnosis not present

## 2022-12-06 DIAGNOSIS — R001 Bradycardia, unspecified: Secondary | ICD-10-CM | POA: Insufficient documentation

## 2022-12-06 DIAGNOSIS — Z7901 Long term (current) use of anticoagulants: Secondary | ICD-10-CM | POA: Insufficient documentation

## 2022-12-06 LAB — BASIC METABOLIC PANEL
Anion gap: 8 (ref 5–15)
BUN: 21 mg/dL (ref 8–23)
CO2: 27 mmol/L (ref 22–32)
Calcium: 8.3 mg/dL — ABNORMAL LOW (ref 8.9–10.3)
Chloride: 104 mmol/L (ref 98–111)
Creatinine, Ser: 1.56 mg/dL — ABNORMAL HIGH (ref 0.61–1.24)
GFR, Estimated: 44 mL/min — ABNORMAL LOW (ref >60)
Glucose, Bld: 105 mg/dL — ABNORMAL HIGH (ref 70–99)
Potassium: 4.3 mmol/L (ref 3.5–5.1)
Sodium: 139 mmol/L (ref 135–145)

## 2022-12-06 LAB — BRAIN NATRIURETIC PEPTIDE: B Natriuretic Peptide: 172.5 pg/mL — ABNORMAL HIGH (ref 0.0–100.0)

## 2022-12-06 MED ORDER — SPIRONOLACTONE 25 MG PO TABS: 25.0000 mg | ORAL_TABLET | Freq: Every day | ORAL | 3 refills | Status: DC

## 2022-12-06 MED ORDER — POTASSIUM CHLORIDE CRYS ER 10 MEQ PO TBCR: 10.0000 meq | EXTENDED_RELEASE_TABLET | ORAL | 0 refills | Status: DC

## 2022-12-06 MED ORDER — FUROSEMIDE 20 MG PO TABS: 20.0000 mg | ORAL_TABLET | ORAL | 3 refills | Status: DC

## 2022-12-06 NOTE — Patient Instructions (Addendum)
Medication Changes:  INCREASE SPIRONOLACTONE 25MG  ONCE DAILY   DECREASE LASIX TO 20MG  ON MONDAY AND FRIDAY   DECREASE POTASSIUM TO MONDAY AND FRIDAY   Lab Work:  Labs done today, your results will be available in MyChart, we will contact you for abnormal readings.  THEN   PLEASE REPORT TO [facility] IN 10 DAYS FOR REPEAT BLOOD WORK   Follow-Up in: AS SCHEDULED WITH APP IN 2 MONTHS   At the [facility], you and your health needs are our priority. We have a designated team specialized in the treatment of Heart Failure. This Care Team includes your primary Heart Failure Specialized Cardiologist (physician), Advanced Practice Providers (APPs- Physician Assistants and Nurse Practitioners), and Pharmacist who all work together to provide you with the care you need, when you need it.   You may see any of the following providers on your designated Care Team at your next follow up:  Dr. Arvilla Meres Dr. [name] Dr. Marcos Eke, NP Robbie Lis, [place] [facility] [place], [place] Brynda Peon, NP Karle Plumber, PharmD   Please be sure to bring in all your medications bottles to every appointment.   Need to Contact us:  If you have any questions or concerns before your next appointment please send [place] a message through [place] or call our office at [phone].    TO LEAVE A MESSAGE FOR THE NURSE SELECT OPTION 2, PLEASE LEAVE A MESSAGE INCLUDING: YOUR NAME DATE OF BIRTH CALL BACK NUMBER REASON FOR CALL**this is important as we prioritize the call backs  YOU WILL RECEIVE A CALL BACK THE SAME DAY AS LONG AS YOU CALL BEFORE 4:00 PM

## 2022-12-07 ENCOUNTER — Other Ambulatory Visit (HOSPITAL_COMMUNITY): Payer: Self-pay

## 2022-12-07 NOTE — Progress Notes (Signed)
PCP: Benita Stabile, MD Cardiology: Dr. Diona Browner HF Cardiology: Dr. Shirlee Latch  82 y.o. with history of persistent atrial fibrillation, CAD, and CHF was referred to HF clinic by Dr. Diona Browner for evaluation of CHF.  He had a DES placed in the LAD in 2/10.  He has a hepaticojejunostomy post-complicated cholecystectomy with common bile duct rupture and chronic hepatic abscess. He has had recurrent cholangitis and recurrent biliary strictures.  He reports worsening dyspnea for several months. Echo in 11/23 showed EF 40-45%, mild LV dilation, mild LVH, mildly decreased RV systolic function, mild MR, mild-moderate TR. He was noted to be in atrial fibrillation since 11/23.  He had not been anticoagulated in the past due to low platelets though recently they have been running in the 80s-100s. He has a history of orthostatic hypotension.    Echo in 5/24 showed EF 35%, global hypokinesis, mild LVH, mild RV enlargement and mildly decreased systolic function, severe biatrial enlargement.   Anticoagulation was started and patient had DCCV to NSR in 6/24.     Patient returns for followup of CHF.  He remains in NSR today.  Weight down 8 lbs.  Breathing better though energy level overall is still poor. No dyspnea walking into the office. No chest pain.  No orthopnea/PND.  No lightheadedness, has been off midodrine.   ECG (personally reviewed): NSR, nonspecific T wave flattening  Labs (3/24): LDL 78, plts 106K, K 4, creatinine 1.28 Labs (5/24): K 4.1, creatinine 1.27, plts 84  Labs (6/24): K 4.1, creatinine 1.53  PMH: 1. CAD: DES to LAD in 2/10.  - Cardiolite (5/23): EF 48%, no ischemia/infarction.  2. COPD 3. BPH 4. Hyperlipidemia 5. Melanoma 6. Atrial fibrillation: Persistent - DCCV to NSR in 6/24  7. Chronic thrombocytopenia 8. OSA 9. Chronic dysphagia 10. Biliary system disease: Complicated cholecystectomy with rupture of common bile duct, chronic hepatic abscess after this.   - Hepaticojejunostomy with  recurrent cholangitis.  - Recurrent biliary stricture requiring dilations.  11. Chronic HF with mid range EF: Echo (2/18) with EF 55-60%.  Echo (11/23) with EF 40-45%, mild LV dilation, mild LVH, mildly decreased RV systolic function, mild MR, mild-moderate TR.  - Echo (5/24): EF 35%, global hypokinesis, mild LVH, mild RV enlargement and mildly decreased systolic function, severe biatrial enlargement.   SH: Married, lives [place] farm near [place], nonsmoker, no ETOH.   Family History  Problem Relation Age of Onset   Colon cancer Sister    Heart disease Mother    Emphysema Father    ROS: All systems reviewed and negative except as per HPI.  Current Outpatient Medications  Medication Sig Dispense Refill   amiodarone (PACERONE) 200 MG tablet Take 200 mg by mouth daily.     amoxicillin-clavulanate (AUGMENTIN) 875-125 MG tablet Take 1 tablet by mouth 2 (two) times daily.     apixaban (ELIQUIS) 5 MG TABS tablet Take 1 tablet (5 mg total) by mouth 2 (two) times daily. 60 tablet 11   cephALEXin (KEFLEX) 500 MG capsule Take 500 mg by mouth 2 (two) times daily.     ciprofloxacin (CIPRO) 500 MG tablet Take 1 tablet by mouth as directed. Patient will take 1 tablet twice a day for 1 month; current regimen is Bactrim - will start Cipro in April     empagliflozin (JARDIANCE) 10 MG TABS tablet Take 1 tablet (10 mg total) by mouth daily before breakfast. 30 tablet 11   finasteride (PROSCAR) 5 MG tablet Take 5 mg by mouth in the  morning.     metoprolol succinate (TOPROL XL) 25 MG 24 hr tablet Take 1 tablet (25 mg total) by mouth daily. 90 tablet 3   mirtazapine (REMERON) 7.5 MG tablet Take 7.5 mg by mouth at bedtime.     Multiple Vitamin (MULITIVITAMIN WITH MINERALS) TABS Take 1 tablet by mouth in the morning. Centrum Silver men 50+     Multiple Vitamins-Minerals (PRESERVISION AREDS) CAPS Take 1 capsule by mouth in the morning and at bedtime.      nitroGLYCERIN (NITROSTAT) 0.4 MG SL tablet Place 0.4 mg  under the tongue every 5 (five) minutes as needed for chest pain.     Oxymetazoline HCl (AFRIN NASAL SPRAY NA) Place 2 sprays into the nose 2 (two) times daily as needed.      pantoprazole (PROTONIX) 40 MG tablet Take 40 mg by mouth at bedtime.      Polyvinyl Alcohol-Povidone PF (REFRESH) 1.4-0.6 % SOLN Place 1-2 drops into both eyes 3 (three) times daily as needed (dry/irritated eyes).     pravastatin (PRAVACHOL) 20 MG tablet TAKE 1 TABLET(20 MG) BY MOUTH DAILY 90 tablet 1   Probiotic Product (PROBIOTIC DAILY PO) Take 1 tablet by mouth in the morning.     sodium chloride flush 0.9 % SOLN injection Use 10 mLs by Intracatheter route 3 (three) times daily. 2700 mL 0   sodium chloride flush 0.9 % SOLN injection use 10 mL by Intracatheter route 3 (three) times daily. 2700 mL 2   tamsulosin (FLOMAX) 0.4 MG CAPS capsule Take 0.4 mg by mouth in the morning and at bedtime.   3   ursodiol (ACTIGALL) 250 MG tablet TAKE 1 TABLET(250 MG) BY MOUTH IN THE MORNING AND AT BEDTIME 120 tablet 5   furosemide (LASIX) 20 MG tablet Take 1 tablet (20 mg total) by mouth 2 (two) times a week. ON MONDAY AND FRIDAY 8 tablet 3   potassium chloride (KLOR-CON M) 10 MEQ tablet Take 1 tablet (10 mEq total) by mouth 2 (two) times a week. ON MONDAY AND FRIDAY 30 tablet 0   spironolactone (ALDACTONE) 25 MG tablet Take 1 tablet (25 mg total) by mouth daily. 90 tablet 3   No current facility-administered medications for this encounter.   BP 102/60   Pulse (!) 58   Wt 75.2 kg (165 lb 12.8 oz)   SpO2 97%   BMI 23.12 kg/m  General: NAD, thin Neck: No JVD, no thyromegaly or thyroid nodule.  Lungs: Clear to auscultation bilaterally with normal respiratory effort. CV: Nondisplaced PMI.  Heart regular S1/S2, no S3/S4, no murmur.  Trace ankle edema.  No carotid bruit.  Normal pedal pulses.  Abdomen: Soft, nontender, no hepatosplenomegaly, no distention.  Skin: Intact without lesions or rashes.  Neurologic: Alert and oriented x 3.   Psych: Normal affect. Extremities: No clubbing or cyanosis.  HEENT: Normal.   Assessment/Plan: 1. Chronic HF with mid-range EF: Echo (11/23) with EF 40-45%, mild LV dilation, mild LVH, mildly decreased RV systolic function, mild MR, mild-moderate TR.  Cardiolite in 5/23 with no ischemia or infarction.  Echo in 5/24 with EF 35%, global hypokinesis, mild LVH, mild RV enlargement and mildly decreased systolic function, severe biatrial enlargement.  The onset of atrial fibrillation may have triggered CHF worsening, now s/p DCCV to NSR in 6/24.  He has had no chest pain.  NYHA class II-III symptoms, improved.  Not volume overloaded on exam and weight is down.  - Continue Jardiance 10 mg daily.   - Decrease  Lasix to 20 mg on Monday and Friday and KCl 10 mEq on Monday and Friday.   - Continue Toprol XL 25 mg daily.  - Increase spironolactone to 25 mg at bedtime.  BMET/BNP today, BMET 10 days.  - history of orthostatic hypotension, but has not needed midodrine recently.  2. Atrial fibrillation: Persistent.  He had been in atrial fibrillation since 11/23.  This seemed to coincide with worsening of CHF symptoms. He had not been anticoagulated due to low platelets. However, looking back, his platelets seem to range 80K-100K which is not markedly low.  Anticoagulation has been started and he is doing well with it so far.  S/p DCCV in 6/24 and in NSR today.  - Toprol XL 25 mg daily.  - Apixaban 5 mg bid.  - Continue amiodarone 200 mg daily to maintain NSR.  Check LFTs and TSH today, will need regular eye exam.   3. Recurrent biliary stricture with hepaticojejunostomy: Has biliary stent.    4. CAD: DES to LAD in 2/10.  No chest pain.  Cardiolite in 5/23 showed no ischemia. EF is lower but may be related to AF, will hold off on cath for now with no CP.  - Continue pravastatin, lipids acceptable in 3/24.   Followup in 2 months.   [name] 12/07/2022

## 2022-12-13 ENCOUNTER — Other Ambulatory Visit (HOSPITAL_COMMUNITY)
Admission: RE | Admit: 2022-12-13 | Discharge: 2022-12-13 | Disposition: A | Discharge status: Home / Self Care | Payer: Medicare Other | Source: Ambulatory Visit | Attending: Cardiology | Admitting: Cardiology

## 2022-12-13 DIAGNOSIS — I480 Paroxysmal atrial fibrillation: Secondary | ICD-10-CM | POA: Diagnosis not present

## 2022-12-13 LAB — CBC
HCT: 38.2 % — ABNORMAL LOW (ref 39.0–52.0)
Hemoglobin: 11.8 g/dL — ABNORMAL LOW (ref 13.0–17.0)
MCH: 27.1 pg (ref 26.0–34.0)
MCHC: 30.9 g/dL (ref 30.0–36.0)
MCV: 87.8 fL (ref 80.0–100.0)
Platelets: 89 10*3/uL — ABNORMAL LOW (ref 150–400)
RBC: 4.35 MIL/uL (ref 4.22–5.81)
RDW: 16.9 % — ABNORMAL HIGH (ref 11.5–15.5)
WBC: 10.8 10*3/uL — ABNORMAL HIGH (ref 4.0–10.5)
nRBC: 0 % (ref 0.0–0.2)

## 2022-12-19 ENCOUNTER — Telehealth (HOSPITAL_COMMUNITY): Payer: Self-pay | Admitting: Cardiology

## 2022-12-19 DIAGNOSIS — Z7182 Exercise counseling: Secondary | ICD-10-CM | POA: Diagnosis not present

## 2022-12-19 DIAGNOSIS — Z79899 Other long term (current) drug therapy: Secondary | ICD-10-CM | POA: Diagnosis not present

## 2022-12-19 DIAGNOSIS — K8309 Other cholangitis: Secondary | ICD-10-CM | POA: Diagnosis not present

## 2022-12-19 DIAGNOSIS — Z713 Dietary counseling and surveillance: Secondary | ICD-10-CM | POA: Diagnosis not present

## 2022-12-19 DIAGNOSIS — Z6824 Body mass index (BMI) 24.0-24.9, adult: Secondary | ICD-10-CM | POA: Diagnosis not present

## 2022-12-19 DIAGNOSIS — I10 Essential (primary) hypertension: Secondary | ICD-10-CM | POA: Diagnosis not present

## 2022-12-19 DIAGNOSIS — R600 Localized edema: Secondary | ICD-10-CM | POA: Diagnosis not present

## 2022-12-19 DIAGNOSIS — I429 Cardiomyopathy, unspecified: Secondary | ICD-10-CM | POA: Diagnosis not present

## 2022-12-19 DIAGNOSIS — I5022 Chronic systolic (congestive) heart failure: Secondary | ICD-10-CM | POA: Diagnosis not present

## 2022-12-19 DIAGNOSIS — I480 Paroxysmal atrial fibrillation: Secondary | ICD-10-CM | POA: Diagnosis not present

## 2022-12-19 DIAGNOSIS — I110 Hypertensive heart disease with heart failure: Secondary | ICD-10-CM | POA: Diagnosis not present

## 2022-12-19 DIAGNOSIS — Z87891 Personal history of nicotine dependence: Secondary | ICD-10-CM | POA: Diagnosis not present

## 2022-12-19 NOTE — Telephone Encounter (Signed)
He can hold Eliquis prior x 2 days if this is needed.  He can use Lasix just prn.

## 2022-12-19 NOTE — Telephone Encounter (Signed)
Patients wife called to report  surgery for liver stents  is scheduled for 8/1 at [facility]. -wanted to update provider -confirm If any medications hold prior to surgery   Pt would also like to decrease diuretic   Reports weight was around 170-175 x 2-3 months ago  Current weight 154 Would like to change back to as needed with weight loss. Currently taking lasix 20 mg twice a week

## 2022-12-20 NOTE — Telephone Encounter (Signed)
No answer LMOM 

## 2023-01-07 DIAGNOSIS — R972 Elevated prostate specific antigen [PSA]: Secondary | ICD-10-CM | POA: Diagnosis not present

## 2023-01-07 DIAGNOSIS — N401 Enlarged prostate with lower urinary tract symptoms: Secondary | ICD-10-CM | POA: Diagnosis not present

## 2023-01-07 DIAGNOSIS — N529 Male erectile dysfunction, unspecified: Secondary | ICD-10-CM | POA: Diagnosis not present

## 2023-01-07 DIAGNOSIS — R350 Frequency of micturition: Secondary | ICD-10-CM | POA: Diagnosis not present

## 2023-01-09 MED ORDER — FUROSEMIDE 20 MG PO TABS: 20.0000 mg | ORAL_TABLET | ORAL | 0 refills | Status: DC | PRN

## 2023-01-09 NOTE — Telephone Encounter (Signed)
Pt aware via wife and voiced understanding. Procedure 8/21-last dose 8/18 -med list updated

## 2023-01-16 DIAGNOSIS — R109 Unspecified abdominal pain: Secondary | ICD-10-CM | POA: Diagnosis not present

## 2023-01-16 DIAGNOSIS — I714 Abdominal aortic aneurysm, without rupture, unspecified: Secondary | ICD-10-CM | POA: Diagnosis not present

## 2023-01-16 DIAGNOSIS — E785 Hyperlipidemia, unspecified: Secondary | ICD-10-CM | POA: Diagnosis present

## 2023-01-16 DIAGNOSIS — Z9049 Acquired absence of other specified parts of digestive tract: Secondary | ICD-10-CM | POA: Diagnosis not present

## 2023-01-16 DIAGNOSIS — J9811 Atelectasis: Secondary | ICD-10-CM | POA: Diagnosis not present

## 2023-01-16 DIAGNOSIS — K388 Other specified diseases of appendix: Secondary | ICD-10-CM | POA: Diagnosis not present

## 2023-01-16 DIAGNOSIS — N400 Enlarged prostate without lower urinary tract symptoms: Secondary | ICD-10-CM | POA: Diagnosis present

## 2023-01-16 DIAGNOSIS — Z955 Presence of coronary angioplasty implant and graft: Secondary | ICD-10-CM | POA: Diagnosis not present

## 2023-01-16 DIAGNOSIS — R188 Other ascites: Secondary | ICD-10-CM | POA: Diagnosis not present

## 2023-01-16 DIAGNOSIS — J90 Pleural effusion, not elsewhere classified: Secondary | ICD-10-CM | POA: Diagnosis not present

## 2023-01-16 DIAGNOSIS — R112 Nausea with vomiting, unspecified: Secondary | ICD-10-CM | POA: Diagnosis not present

## 2023-01-16 DIAGNOSIS — K831 Obstruction of bile duct: Secondary | ICD-10-CM | POA: Diagnosis present

## 2023-01-16 DIAGNOSIS — Z85820 Personal history of malignant melanoma of skin: Secondary | ICD-10-CM | POA: Diagnosis not present

## 2023-01-16 DIAGNOSIS — R161 Splenomegaly, not elsewhere classified: Secondary | ICD-10-CM | POA: Diagnosis not present

## 2023-01-16 DIAGNOSIS — M545 Low back pain, unspecified: Secondary | ICD-10-CM | POA: Diagnosis not present

## 2023-01-16 DIAGNOSIS — Z4682 Encounter for fitting and adjustment of non-vascular catheter: Secondary | ICD-10-CM | POA: Diagnosis not present

## 2023-01-16 DIAGNOSIS — Z86718 Personal history of other venous thrombosis and embolism: Secondary | ICD-10-CM | POA: Diagnosis not present

## 2023-01-16 DIAGNOSIS — R5382 Chronic fatigue, unspecified: Secondary | ICD-10-CM | POA: Diagnosis present

## 2023-01-16 DIAGNOSIS — I2510 Atherosclerotic heart disease of native coronary artery without angina pectoris: Secondary | ICD-10-CM | POA: Diagnosis present

## 2023-01-16 DIAGNOSIS — Z4659 Encounter for fitting and adjustment of other gastrointestinal appliance and device: Secondary | ICD-10-CM | POA: Diagnosis not present

## 2023-01-16 DIAGNOSIS — I10 Essential (primary) hypertension: Secondary | ICD-10-CM | POA: Diagnosis present

## 2023-01-16 DIAGNOSIS — Z91041 Radiographic dye allergy status: Secondary | ICD-10-CM | POA: Diagnosis not present

## 2023-01-16 DIAGNOSIS — I4891 Unspecified atrial fibrillation: Secondary | ICD-10-CM | POA: Diagnosis present

## 2023-01-16 DIAGNOSIS — K838 Other specified diseases of biliary tract: Secondary | ICD-10-CM | POA: Diagnosis not present

## 2023-01-16 DIAGNOSIS — K56609 Unspecified intestinal obstruction, unspecified as to partial versus complete obstruction: Secondary | ICD-10-CM | POA: Diagnosis present

## 2023-01-16 DIAGNOSIS — R001 Bradycardia, unspecified: Secondary | ICD-10-CM | POA: Diagnosis not present

## 2023-01-16 DIAGNOSIS — I739 Peripheral vascular disease, unspecified: Secondary | ICD-10-CM | POA: Diagnosis present

## 2023-01-17 DIAGNOSIS — R001 Bradycardia, unspecified: Secondary | ICD-10-CM | POA: Diagnosis not present

## 2023-01-18 DIAGNOSIS — R112 Nausea with vomiting, unspecified: Secondary | ICD-10-CM | POA: Diagnosis not present

## 2023-01-18 DIAGNOSIS — I714 Abdominal aortic aneurysm, without rupture, unspecified: Secondary | ICD-10-CM | POA: Diagnosis not present

## 2023-01-18 DIAGNOSIS — I2510 Atherosclerotic heart disease of native coronary artery without angina pectoris: Secondary | ICD-10-CM | POA: Diagnosis present

## 2023-01-18 DIAGNOSIS — R109 Unspecified abdominal pain: Secondary | ICD-10-CM | POA: Diagnosis not present

## 2023-01-18 DIAGNOSIS — K838 Other specified diseases of biliary tract: Secondary | ICD-10-CM | POA: Diagnosis not present

## 2023-01-18 DIAGNOSIS — N400 Enlarged prostate without lower urinary tract symptoms: Secondary | ICD-10-CM | POA: Diagnosis present

## 2023-01-18 DIAGNOSIS — E785 Hyperlipidemia, unspecified: Secondary | ICD-10-CM | POA: Diagnosis present

## 2023-01-18 DIAGNOSIS — J90 Pleural effusion, not elsewhere classified: Secondary | ICD-10-CM | POA: Diagnosis not present

## 2023-01-18 DIAGNOSIS — K831 Obstruction of bile duct: Secondary | ICD-10-CM | POA: Diagnosis present

## 2023-01-18 DIAGNOSIS — I4891 Unspecified atrial fibrillation: Secondary | ICD-10-CM | POA: Diagnosis present

## 2023-01-18 DIAGNOSIS — Z955 Presence of coronary angioplasty implant and graft: Secondary | ICD-10-CM | POA: Diagnosis not present

## 2023-01-18 DIAGNOSIS — K56609 Unspecified intestinal obstruction, unspecified as to partial versus complete obstruction: Secondary | ICD-10-CM | POA: Diagnosis present

## 2023-01-18 DIAGNOSIS — R161 Splenomegaly, not elsewhere classified: Secondary | ICD-10-CM | POA: Diagnosis not present

## 2023-01-18 DIAGNOSIS — Z86718 Personal history of other venous thrombosis and embolism: Secondary | ICD-10-CM | POA: Diagnosis not present

## 2023-01-18 DIAGNOSIS — R5382 Chronic fatigue, unspecified: Secondary | ICD-10-CM | POA: Diagnosis present

## 2023-01-18 DIAGNOSIS — Z85820 Personal history of malignant melanoma of skin: Secondary | ICD-10-CM | POA: Diagnosis not present

## 2023-01-18 DIAGNOSIS — I739 Peripheral vascular disease, unspecified: Secondary | ICD-10-CM | POA: Diagnosis present

## 2023-01-18 DIAGNOSIS — J9811 Atelectasis: Secondary | ICD-10-CM | POA: Diagnosis not present

## 2023-01-18 DIAGNOSIS — R188 Other ascites: Secondary | ICD-10-CM | POA: Diagnosis not present

## 2023-01-18 DIAGNOSIS — M545 Low back pain, unspecified: Secondary | ICD-10-CM | POA: Diagnosis not present

## 2023-01-18 DIAGNOSIS — Z91041 Radiographic dye allergy status: Secondary | ICD-10-CM | POA: Diagnosis not present

## 2023-01-18 DIAGNOSIS — K388 Other specified diseases of appendix: Secondary | ICD-10-CM | POA: Diagnosis not present

## 2023-01-18 DIAGNOSIS — I10 Essential (primary) hypertension: Secondary | ICD-10-CM | POA: Diagnosis present

## 2023-01-18 DIAGNOSIS — Z4682 Encounter for fitting and adjustment of non-vascular catheter: Secondary | ICD-10-CM | POA: Diagnosis not present

## 2023-01-18 DIAGNOSIS — Z9049 Acquired absence of other specified parts of digestive tract: Secondary | ICD-10-CM | POA: Diagnosis not present

## 2023-01-25 ENCOUNTER — Ambulatory Visit: Payer: Medicare Other | Admitting: Cardiology

## 2023-01-29 DIAGNOSIS — K831 Obstruction of bile duct: Secondary | ICD-10-CM | POA: Diagnosis not present

## 2023-02-04 NOTE — Progress Notes (Signed)
PCP: Benita Stabile, MD Cardiology: Dr. Diona Browner HF Cardiology: Dr. Shirlee Latch  82 y.o. with history of persistent atrial fibrillation, CAD, and CHF was referred to HF clinic by Dr. Diona Browner for evaluation of CHF.  He had a DES placed in the LAD in 2/10.  He has a hepaticojejunostomy post-complicated cholecystectomy with common bile duct rupture and chronic hepatic abscess. He has had recurrent cholangitis and recurrent biliary strictures.  He reports worsening dyspnea for several months. Echo in 11/23 showed EF 40-45%, mild LV dilation, mild LVH, mildly decreased RV systolic function, mild MR, mild-moderate TR. He was noted to be in atrial fibrillation since 11/23.  He had not been anticoagulated in the past due to low platelets though recently they have been running in the 80s-100s. He has a history of orthostatic hypotension.    Echo in 5/24 showed EF 35%, global hypokinesis, mild LVH, mild RV enlargement and mildly decreased systolic function, severe biatrial enlargement.   Anticoagulation was started and patient had DCCV to NSR in 6/24.    Underwent routine biliary tube exchange at [facility] and had later admitted for SBO. Treated with bowel rest and NGT.  Today he returns for HF follow up with his wife. Overall feeling fine. Mild dyspnea walking up steps. Denies abnormal bleeding, palpitations, CP, dizziness, edema, or PND/Orthopnea. Appetite ok. No fever or chills. Weight at home 157 pounds. Has been off Jardiance, unclear how long. Used Lasix 2x this past week. Has 3 more months with drain before deciding on removal.  ECG (personally reviewed from 01/18/23 at [facility]): NSR 81 bom  Labs (3/24): LDL 78, plts 106K, K 4, creatinine 1.28 Labs (5/24): K 4.1, creatinine 1.27, plts 84  Labs (6/24): K 4.1, creatinine 1.53 Labs (8/24): K 3.9, creatinine 1.6, plts 65  PMH: 1. CAD: DES to LAD in 2/10.  - Cardiolite (5/23): EF 48%, no ischemia/infarction.  2. COPD 3. BPH 4. Hyperlipidemia 5. Melanoma 6.  Atrial fibrillation: Persistent - DCCV to NSR in 6/24  7. Chronic thrombocytopenia 8. OSA 9. Chronic dysphagia 10. Biliary system disease: Complicated cholecystectomy with rupture of common bile duct, chronic hepatic abscess after this.   - Hepaticojejunostomy with recurrent cholangitis.  - Recurrent biliary stricture requiring dilations.  11. Chronic HF with mid range EF: Echo (2/18) with EF 55-60%.  Echo (11/23) with EF 40-45%, mild LV dilation, mild LVH, mildly decreased RV systolic function, mild MR, mild-moderate TR.  - Echo (5/24): EF 35%, global hypokinesis, mild LVH, mild RV enlargement and mildly decreased systolic function, severe biatrial enlargement.   SH: Married, lives on farm near [place], nonsmoker, no ETOH.   Family History  Problem Relation Age of Onset   Colon cancer Sister    Heart disease Mother    Emphysema Father    ROS: All systems reviewed and negative except as per HPI.  Current Outpatient Medications  Medication Sig Dispense Refill   amiodarone (PACERONE) 200 MG tablet Take 200 mg by mouth daily.     amoxicillin-clavulanate (AUGMENTIN) 875-125 MG tablet Take 1 tablet by mouth 2 (two) times daily.     cephALEXin (KEFLEX) 500 MG capsule Take 500 mg by mouth 2 (two) times daily.     ciprofloxacin (CIPRO) 500 MG tablet Take 1 tablet by mouth as directed. Patient will take 1 tablet twice a day for 1 month; current regimen is Bactrim - will start Cipro in April     finasteride (PROSCAR) 5 MG tablet Take 5 mg by mouth in the morning.  furosemide (LASIX) 20 MG tablet Take 1 tablet (20 mg total) by mouth as needed for fluid. 30 tablet 0   metoprolol succinate (TOPROL XL) 25 MG 24 hr tablet Take 1 tablet (25 mg total) by mouth daily. 90 tablet 3   mirtazapine (REMERON) 7.5 MG tablet Take 7.5 mg by mouth at bedtime.     Multiple Vitamin (MULITIVITAMIN WITH MINERALS) TABS Take 1 tablet by mouth in the morning. Centrum Silver men 50+     Multiple Vitamins-Minerals  (PRESERVISION AREDS) CAPS Take 1 capsule by mouth in the morning and at bedtime.      nitroGLYCERIN (NITROSTAT) 0.4 MG SL tablet Place 0.4 mg under the tongue every 5 (five) minutes as needed for chest pain.     Oxymetazoline HCl (AFRIN NASAL SPRAY NA) Place 2 sprays into the nose 2 (two) times daily as needed.      pantoprazole (PROTONIX) 40 MG tablet Take 40 mg by mouth at bedtime.      Polyvinyl Alcohol-Povidone PF (REFRESH) 1.4-0.6 % SOLN Place 1-2 drops into both eyes 3 (three) times daily as needed (dry/irritated eyes).     potassium chloride (KLOR-CON M) 10 MEQ tablet Take 1 tablet (10 mEq total) by mouth 2 (two) times a week. ON MONDAY AND FRIDAY 30 tablet 0   pravastatin (PRAVACHOL) 20 MG tablet TAKE 1 TABLET(20 MG) BY MOUTH DAILY 90 tablet 1   Probiotic Product (PROBIOTIC DAILY PO) Take 1 tablet by mouth in the morning.     sodium chloride flush 0.9 % SOLN injection Use 10 mLs by Intracatheter route 3 (three) times daily. 2700 mL 0   sodium chloride flush 0.9 % SOLN injection use 10 mL by Intracatheter route 3 (three) times daily. 2700 mL 2   spironolactone (ALDACTONE) 25 MG tablet Take 1 tablet (25 mg total) by mouth daily. (Patient taking differently: Take 12.5 mg by mouth daily.) 90 tablet 3   tamsulosin (FLOMAX) 0.4 MG CAPS capsule Take 0.4 mg by mouth in the morning and at bedtime.   3   ursodiol (ACTIGALL) 250 MG tablet TAKE 1 TABLET(250 MG) BY MOUTH IN THE MORNING AND AT BEDTIME 120 tablet 5   No current facility-administered medications for this encounter.   Wt Readings from Last 3 Encounters:  02/05/23 74.6 kg (164 lb 6.4 oz)  12/06/22 75.2 kg (165 lb 12.8 oz)  11/19/22 71.7 kg (158 lb)   BP 110/64   Pulse 61   Wt 74.6 kg (164 lb 6.4 oz)   SpO2 98%   BMI 22.93 kg/m  Physical Exam General:  NAD. No resp difficulty, walked into clinic, elderly HEENT: Normal Neck: Supple. No JVD. Carotids 2+ bilat; no bruits. No lymphadenopathy or thryomegaly appreciated. Cor: PMI  nondisplaced. Regular rate & rhythm. No rubs, gallops or murmurs. Lungs: Clear Abdomen: Soft, nontender, nondistended. No hepatosplenomegaly. No bruits or masses. Good bowel sounds. Extremities: No cyanosis, clubbing, rash, R ankle edema Neuro: Alert & oriented x 3, cranial nerves grossly intact. Moves all 4 extremities w/o difficulty. Affect pleasant.  Assessment/Plan: 1. Chronic HF with mid-range EF: Echo (11/23) with EF 40-45%, mild LV dilation, mild LVH, mildly decreased RV systolic function, mild MR, mild-moderate TR.  Cardiolite in 5/23 with no ischemia or infarction.  Echo in 5/24 with EF 35%, global hypokinesis, mild LVH, mild RV enlargement and mildly decreased systolic function, severe biatrial enlargement.  The onset of atrial fibrillation may have triggered CHF worsening, now s/p DCCV to NSR in 6/24.  He has had  no chest pain.  NYHA class II-IIb symptoms, improved.  Not volume overloaded on exam. - Restart Jardiance 10 mg daily.  Recent labs reviewed and are stable. - Continue Lasix PRN - Continue Toprol XL 25 mg daily.  - Continue spironolactone 12.5 mg at bedtime.  - history of orthostatic hypotension, but has not needed midodrine recently.  2. Atrial fibrillation: Persistent.  He had been in atrial fibrillation since 11/23.  This seemed to coincide with worsening of CHF symptoms. He had not been anticoagulated due to low platelets. However, looking back, his platelets seem to range 80K-100K which is not markedly low.  Anticoagulation has been started and he is doing well with it so far.  S/p DCCV in 6/24. Regular on exam today, recent ECG showed NSR - Continue Toprol XL 25 mg daily.  - Continue apixaban 5 mg bid. No bleeding issues. - Continue amiodarone 200 mg daily to maintain NSR.  Recent LFTs ok, check TSH and free T4 today. He will need regular eye exam.   3. Recurrent biliary stricture with hepaticojejunostomy: Has biliary stent.    - Recent BPD exchange 8/24, required obs  overnight due to belly pain, NGT and bowel rest. 4. CAD: DES to LAD in 2/10.  No chest pain.  Cardiolite in 5/23 showed no ischemia. EF is lower but may be related to AF, will hold off on cath for now with no CP.  - Continue pravastatin, lipids acceptable in 3/24.  5. Pre-Operative Clearance: planning EGD and colonoscopy soon. He is at moderate risk for minor risk procedure, from CV standpoint.   Follow up in 2-3 months with Dr. Kathreen Cornfield [facility] FNP-BC 02/05/2023

## 2023-02-05 ENCOUNTER — Other Ambulatory Visit (HOSPITAL_COMMUNITY): Payer: Self-pay

## 2023-02-05 ENCOUNTER — Ambulatory Visit (HOSPITAL_COMMUNITY)
Admission: RE | Admit: 2023-02-05 | Discharge: 2023-02-05 | Disposition: A | Discharge status: Home / Self Care | Payer: Medicare Other | Source: Ambulatory Visit | Attending: Family Medicine | Admitting: Family Medicine

## 2023-02-05 ENCOUNTER — Telehealth (HOSPITAL_COMMUNITY): Payer: Self-pay

## 2023-02-05 ENCOUNTER — Encounter (HOSPITAL_COMMUNITY): Payer: Self-pay

## 2023-02-05 ENCOUNTER — Encounter (INDEPENDENT_AMBULATORY_CARE_PROVIDER_SITE_OTHER): Payer: Medicare Other | Admitting: Ophthalmology

## 2023-02-05 VITALS — BP 110/64 | HR 61 | Wt 164.4 lb

## 2023-02-05 DIAGNOSIS — K831 Obstruction of bile duct: Secondary | ICD-10-CM | POA: Diagnosis not present

## 2023-02-05 DIAGNOSIS — I5022 Chronic systolic (congestive) heart failure: Secondary | ICD-10-CM | POA: Insufficient documentation

## 2023-02-05 DIAGNOSIS — I480 Paroxysmal atrial fibrillation: Secondary | ICD-10-CM

## 2023-02-05 DIAGNOSIS — I2510 Atherosclerotic heart disease of native coronary artery without angina pectoris: Secondary | ICD-10-CM | POA: Insufficient documentation

## 2023-02-05 DIAGNOSIS — H353132 Nonexudative age-related macular degeneration, bilateral, intermediate dry stage: Secondary | ICD-10-CM | POA: Diagnosis not present

## 2023-02-05 DIAGNOSIS — Z9049 Acquired absence of other specified parts of digestive tract: Secondary | ICD-10-CM | POA: Insufficient documentation

## 2023-02-05 DIAGNOSIS — I25119 Atherosclerotic heart disease of native coronary artery with unspecified angina pectoris: Secondary | ICD-10-CM | POA: Diagnosis not present

## 2023-02-05 DIAGNOSIS — Z01810 Encounter for preprocedural cardiovascular examination: Secondary | ICD-10-CM

## 2023-02-05 DIAGNOSIS — Z7901 Long term (current) use of anticoagulants: Secondary | ICD-10-CM | POA: Diagnosis not present

## 2023-02-05 DIAGNOSIS — Z79899 Other long term (current) drug therapy: Secondary | ICD-10-CM | POA: Insufficient documentation

## 2023-02-05 DIAGNOSIS — I4819 Other persistent atrial fibrillation: Secondary | ICD-10-CM | POA: Insufficient documentation

## 2023-02-05 DIAGNOSIS — Z7984 Long term (current) use of oral hypoglycemic drugs: Secondary | ICD-10-CM | POA: Insufficient documentation

## 2023-02-05 DIAGNOSIS — Z955 Presence of coronary angioplasty implant and graft: Secondary | ICD-10-CM | POA: Insufficient documentation

## 2023-02-05 DIAGNOSIS — Z9689 Presence of other specified functional implants: Secondary | ICD-10-CM | POA: Diagnosis not present

## 2023-02-05 DIAGNOSIS — H43823 Vitreomacular adhesion, bilateral: Secondary | ICD-10-CM | POA: Diagnosis not present

## 2023-02-05 LAB — TSH: TSH: 2.847 u[IU]/mL (ref 0.350–4.500)

## 2023-02-05 LAB — T4, FREE: Free T4: 1.15 ng/dL — ABNORMAL HIGH (ref 0.61–1.12)

## 2023-02-05 MED ORDER — EMPAGLIFLOZIN 10 MG PO TABS: 10.0000 mg | ORAL_TABLET | Freq: Every day | ORAL | 5 refills | Status: DC

## 2023-02-05 NOTE — Telephone Encounter (Signed)
Advanced Heart Failure Patient Advocate Encounter  Advanced Heart Failure Patient Advocate Encounter  The patient was approved for a [employer] grant that will help cover the cost of Jardiance, Spironolactone.  Total amount awarded, $10,000.  Effective: 01/06/2023 - 01/05/2024.  BIN [id number] PCN [id number] Group [id number] ID [id number]  Pharmacy provided with approval and processing information. Patient informed via voicemail.  Burnell Blanks, CPhT Rx Patient Advocate Phone: [phone]

## 2023-02-05 NOTE — Patient Instructions (Signed)
Medication Changes:  RE START JARDIANCE 10MG  ONCE DAILY   Lab Work:  Labs done today, your results will be available in MyChart, we will contact you for abnormal readings.  Follow-Up in: 2-3 MONTHS WITH DR. Shirlee Latch   At the [facility], you and your health needs are our priority. We have a designated team specialized in the treatment of Heart Failure. This Care Team includes your primary Heart Failure Specialized Cardiologist (physician), Advanced Practice Providers (APPs- Physician Assistants and Nurse Practitioners), and Pharmacist who all work together to provide you with the care you need, when you need it.   You may see any of the following providers on your designated Care Team at your next follow up:  Dr. Arvilla Meres Dr. [name] Dr. Marcos Eke, NP Robbie Lis, [place] [facility] [place], [place] Brynda Peon, NP Karle Plumber, PharmD   Please be sure to bring in all your medications bottles to every appointment.   Need to Contact us:  If you have any questions or concerns before your next appointment please send [place] a message through Tool or call our office at [phone].    TO LEAVE A MESSAGE FOR THE NURSE SELECT OPTION 2, PLEASE LEAVE A MESSAGE INCLUDING: YOUR NAME DATE OF BIRTH CALL BACK NUMBER REASON FOR CALL**this is important as we prioritize the call backs  YOU WILL RECEIVE A CALL BACK THE SAME DAY AS LONG AS YOU CALL BEFORE 4:00 PM

## 2023-02-11 DIAGNOSIS — I10 Essential (primary) hypertension: Secondary | ICD-10-CM | POA: Diagnosis not present

## 2023-02-11 DIAGNOSIS — E782 Mixed hyperlipidemia: Secondary | ICD-10-CM | POA: Diagnosis not present

## 2023-02-18 DIAGNOSIS — R630 Anorexia: Secondary | ICD-10-CM | POA: Diagnosis not present

## 2023-02-18 DIAGNOSIS — I5022 Chronic systolic (congestive) heart failure: Secondary | ICD-10-CM | POA: Diagnosis not present

## 2023-02-18 DIAGNOSIS — R600 Localized edema: Secondary | ICD-10-CM | POA: Diagnosis not present

## 2023-02-18 DIAGNOSIS — I130 Hypertensive heart and chronic kidney disease with heart failure and stage 1 through stage 4 chronic kidney disease, or unspecified chronic kidney disease: Secondary | ICD-10-CM | POA: Diagnosis not present

## 2023-02-18 DIAGNOSIS — K8309 Other cholangitis: Secondary | ICD-10-CM | POA: Diagnosis not present

## 2023-02-18 DIAGNOSIS — I480 Paroxysmal atrial fibrillation: Secondary | ICD-10-CM | POA: Diagnosis not present

## 2023-02-18 DIAGNOSIS — I429 Cardiomyopathy, unspecified: Secondary | ICD-10-CM | POA: Diagnosis not present

## 2023-02-18 DIAGNOSIS — N1831 Chronic kidney disease, stage 3a: Secondary | ICD-10-CM | POA: Diagnosis not present

## 2023-02-18 DIAGNOSIS — I2510 Atherosclerotic heart disease of native coronary artery without angina pectoris: Secondary | ICD-10-CM | POA: Diagnosis not present

## 2023-02-18 DIAGNOSIS — I10 Essential (primary) hypertension: Secondary | ICD-10-CM | POA: Diagnosis not present

## 2023-02-18 DIAGNOSIS — Z23 Encounter for immunization: Secondary | ICD-10-CM | POA: Diagnosis not present

## 2023-02-18 DIAGNOSIS — E782 Mixed hyperlipidemia: Secondary | ICD-10-CM | POA: Diagnosis not present

## 2023-02-19 ENCOUNTER — Other Ambulatory Visit (HOSPITAL_COMMUNITY): Payer: Self-pay | Admitting: Cardiology

## 2023-02-19 ENCOUNTER — Other Ambulatory Visit: Payer: Self-pay | Admitting: Cardiology

## 2023-02-19 ENCOUNTER — Encounter: Payer: Self-pay | Admitting: Internal Medicine

## 2023-03-01 DIAGNOSIS — Z4682 Encounter for fitting and adjustment of non-vascular catheter: Secondary | ICD-10-CM | POA: Diagnosis not present

## 2023-03-01 DIAGNOSIS — Z4659 Encounter for fitting and adjustment of other gastrointestinal appliance and device: Secondary | ICD-10-CM | POA: Diagnosis not present

## 2023-03-01 DIAGNOSIS — K831 Obstruction of bile duct: Secondary | ICD-10-CM | POA: Diagnosis not present

## 2023-03-18 ENCOUNTER — Other Ambulatory Visit: Payer: Self-pay | Admitting: Cardiology

## 2023-05-14 ENCOUNTER — Encounter (HOSPITAL_COMMUNITY): Payer: Self-pay | Admitting: Cardiology

## 2023-05-14 ENCOUNTER — Ambulatory Visit (HOSPITAL_COMMUNITY)
Admission: RE | Admit: 2023-05-14 | Discharge: 2023-05-14 | Disposition: A | Discharge status: Home / Self Care | Payer: Medicare Other | Source: Ambulatory Visit | Attending: Cardiology | Admitting: Cardiology

## 2023-05-14 VITALS — BP 102/60 | HR 70 | Wt 170.4 lb

## 2023-05-14 DIAGNOSIS — Z7984 Long term (current) use of oral hypoglycemic drugs: Secondary | ICD-10-CM | POA: Diagnosis not present

## 2023-05-14 DIAGNOSIS — D696 Thrombocytopenia, unspecified: Secondary | ICD-10-CM | POA: Insufficient documentation

## 2023-05-14 DIAGNOSIS — Z79899 Other long term (current) drug therapy: Secondary | ICD-10-CM | POA: Diagnosis not present

## 2023-05-14 DIAGNOSIS — I5022 Chronic systolic (congestive) heart failure: Secondary | ICD-10-CM | POA: Diagnosis not present

## 2023-05-14 DIAGNOSIS — I4819 Other persistent atrial fibrillation: Secondary | ICD-10-CM | POA: Diagnosis not present

## 2023-05-14 DIAGNOSIS — J449 Chronic obstructive pulmonary disease, unspecified: Secondary | ICD-10-CM | POA: Insufficient documentation

## 2023-05-14 DIAGNOSIS — Z955 Presence of coronary angioplasty implant and graft: Secondary | ICD-10-CM | POA: Diagnosis not present

## 2023-05-14 DIAGNOSIS — I2510 Atherosclerotic heart disease of native coronary artery without angina pectoris: Secondary | ICD-10-CM | POA: Diagnosis not present

## 2023-05-14 DIAGNOSIS — I081 Rheumatic disorders of both mitral and tricuspid valves: Secondary | ICD-10-CM | POA: Diagnosis not present

## 2023-05-14 DIAGNOSIS — Z7901 Long term (current) use of anticoagulants: Secondary | ICD-10-CM | POA: Diagnosis not present

## 2023-05-14 DIAGNOSIS — R0600 Dyspnea, unspecified: Secondary | ICD-10-CM | POA: Insufficient documentation

## 2023-05-14 DIAGNOSIS — K831 Obstruction of bile duct: Secondary | ICD-10-CM | POA: Insufficient documentation

## 2023-05-14 LAB — TSH: TSH: 4.193 u[IU]/mL (ref 0.350–4.500)

## 2023-05-14 LAB — COMPREHENSIVE METABOLIC PANEL
ALT: 16 U/L (ref 0–44)
AST: 23 U/L (ref 15–41)
Albumin: 2.9 g/dL — ABNORMAL LOW (ref 3.5–5.0)
Alkaline Phosphatase: 99 U/L (ref 38–126)
Anion gap: 11 (ref 5–15)
BUN: 24 mg/dL — ABNORMAL HIGH (ref 8–23)
CO2: 27 mmol/L (ref 22–32)
Calcium: 8.5 mg/dL — ABNORMAL LOW (ref 8.9–10.3)
Chloride: 103 mmol/L (ref 98–111)
Creatinine, Ser: 1.66 mg/dL — ABNORMAL HIGH (ref 0.61–1.24)
GFR, Estimated: 40 mL/min — ABNORMAL LOW (ref >60)
Glucose, Bld: 77 mg/dL (ref 70–99)
Potassium: 4.4 mmol/L (ref 3.5–5.1)
Sodium: 141 mmol/L (ref 135–145)
Total Bilirubin: 0.9 mg/dL (ref <1.2)
Total Protein: 5.6 g/dL — ABNORMAL LOW (ref 6.5–8.1)

## 2023-05-14 LAB — T4, FREE: Free T4: 1.42 ng/dL — ABNORMAL HIGH (ref 0.61–1.12)

## 2023-05-14 MED ORDER — FUROSEMIDE 20 MG PO TABS: 10.0000 mg | ORAL_TABLET | ORAL | Status: DC

## 2023-05-14 MED ORDER — SPIRONOLACTONE 25 MG PO TABS: 25.0000 mg | ORAL_TABLET | Freq: Every evening | ORAL | 3 refills | Status: DC

## 2023-05-14 NOTE — Progress Notes (Signed)
PCP: Benita Stabile, MD Cardiology: Dr. Diona Browner HF Cardiology: Dr. Shirlee Latch  82 y.o. with history of persistent atrial fibrillation, CAD, and CHF was referred to HF clinic by Dr. Diona Browner for evaluation of CHF.  He had a DES placed in the LAD in 2/10.  He has a hepaticojejunostomy post-complicated cholecystectomy with common bile duct rupture and chronic hepatic abscess. He has had recurrent cholangitis and recurrent biliary strictures.  He reports worsening dyspnea for several months. Echo in 11/23 showed EF 40-45%, mild LV dilation, mild LVH, mildly decreased RV systolic function, mild MR, mild-moderate TR. He was noted to be in atrial fibrillation since 11/23.  He had not been anticoagulated in the past due to low platelets though recently they have been running in the 80s-100s. He has a history of orthostatic hypotension.    Echo in 5/24 showed EF 35%, global hypokinesis, mild LVH, mild RV enlargement and mildly decreased systolic function, severe biatrial enlargement.   Anticoagulation was started and patient had DCCV to NSR in 6/24.    Underwent routine biliary tube exchange at [facility] and had later admitted for SBO in 8/24. Treated with bowel rest and NGT.  Today he returns for HF follow up with his wife. Weight up about 6 lbs.  Biliary stent now out and he is eating better.  He takes Lasix about once a week. He has mild dyspnea walking to the mailbox, this is about 200 feet up an incline.  No dyspnea walking on flat ground.  No orthopnea/PND.  No chest pain.  No lightheadedness.   ECG (personally reviewed): NSR, iRBBB  Labs (3/24): LDL 78, plts 106K, K 4, creatinine 1.28 Labs (5/24): K 4.1, creatinine 1.27, plts 84  Labs (6/24): K 4.1, creatinine 1.53 Labs (8/24): K 3.9, creatinine 1.6, plts 65 Labs (9/24): LDL 66, K 5.1, creatinine 4.40, LFTs normal  PMH: 1. CAD: DES to LAD in 2/10.  - Cardiolite (5/23): EF 48%, no ischemia/infarction.  2. COPD 3. BPH 4. Hyperlipidemia 5. Melanoma 6.  Atrial fibrillation: Persistent - DCCV to NSR in 6/24  7. Chronic thrombocytopenia 8. OSA 9. Chronic dysphagia 10. Biliary system disease: Complicated cholecystectomy with rupture of common bile duct, chronic hepatic abscess after this.   - Hepaticojejunostomy with recurrent cholangitis.  - Recurrent biliary stricture requiring dilations.  11. Chronic HF with mid range EF: Echo (2/18) with EF 55-60%.  Echo (11/23) with EF 40-45%, mild LV dilation, mild LVH, mildly decreased RV systolic function, mild MR, mild-moderate TR.  - Echo (5/24): EF 35%, global hypokinesis, mild LVH, mild RV enlargement and mildly decreased systolic function, severe biatrial enlargement.   SH: Married, lives [place] near [place], nonsmoker, no ETOH.   Family History  Problem Relation Age of Onset   Colon cancer Sister    Heart disease Mother    Emphysema Father    ROS: All systems reviewed and negative except as per HPI.  Current Outpatient Medications  Medication Sig Dispense Refill   amiodarone (PACERONE) 200 MG tablet Take 200 mg by mouth daily.     apixaban (ELIQUIS) 5 MG TABS tablet Take 5 mg by mouth 2 (two) times daily.     empagliflozin (JARDIANCE) 10 MG TABS tablet Take 1 tablet (10 mg total) by mouth daily before breakfast. 30 tablet 5   finasteride (PROSCAR) 5 MG tablet Take 5 mg by mouth in the morning.     metoprolol succinate (TOPROL XL) 25 MG 24 hr tablet Take 1 tablet (25 mg total) by  mouth daily. 90 tablet 3   mirtazapine (REMERON) 7.5 MG tablet Take 7.5 mg by mouth at bedtime.     Multiple Vitamin (MULITIVITAMIN WITH MINERALS) TABS Take 1 tablet by mouth in the morning. Centrum Silver men 50+     Multiple Vitamins-Minerals (PRESERVISION AREDS) CAPS Take 1 capsule by mouth in the morning and at bedtime.      nitroGLYCERIN (NITROSTAT) 0.4 MG SL tablet Place 0.4 mg under the tongue every 5 (five) minutes as needed for chest pain.     Oxymetazoline HCl (AFRIN NASAL SPRAY NA) Place 2 sprays  into the nose 2 (two) times daily as needed.      pantoprazole (PROTONIX) 40 MG tablet Take 40 mg by mouth at bedtime.      Polyvinyl Alcohol-Povidone PF (REFRESH) 1.4-0.6 % SOLN Place 1-2 drops into both eyes 3 (three) times daily as needed (dry/irritated eyes).     potassium chloride (MICRO-K) 10 MEQ CR capsule Take 10 mEq by mouth as needed.     pravastatin (PRAVACHOL) 20 MG tablet TAKE 1 TABLET(20 MG) BY MOUTH DAILY 90 tablet 2   Probiotic Product (PROBIOTIC DAILY PO) Take 1 tablet by mouth in the morning.     tamsulosin (FLOMAX) 0.4 MG CAPS capsule Take 0.4 mg by mouth in the morning and at bedtime.   3   ursodiol (ACTIGALL) 250 MG tablet TAKE 1 TABLET(250 MG) BY MOUTH IN THE MORNING AND AT BEDTIME 120 tablet 5   furosemide (LASIX) 20 MG tablet Take 0.5 tablets (10 mg total) by mouth every other day.     spironolactone (ALDACTONE) 25 MG tablet Take 1 tablet (25 mg total) by mouth at bedtime. 90 tablet 3   No current facility-administered medications for this encounter.   Wt Readings from Last 3 Encounters:  05/14/23 77.3 kg (170 lb 6.4 oz)  02/05/23 74.6 kg (164 lb 6.4 oz)  12/06/22 75.2 kg (165 lb 12.8 oz)   BP 102/60   Pulse 70   Wt 77.3 kg (170 lb 6.4 oz)   SpO2 96%   BMI 23.77 kg/m  General: NAD Neck: JVP 8-9 cm, no thyromegaly or thyroid nodule.  Lungs: Clear to auscultation bilaterally with normal respiratory effort. CV: Nondisplaced PMI.  Heart regular S1/S2, no S3/S4, no murmur.  1+ edema 1/2 to knees bilaterally.  No carotid bruit.  Normal pedal pulses.  Abdomen: Soft, nontender, no hepatosplenomegaly, no distention.  Skin: Intact without lesions or rashes.  Neurologic: Alert and oriented x 3.  Psych: Normal affect. Extremities: No clubbing or cyanosis.  HEENT: Normal.   Assessment/Plan: 1. Chronic HF with mid-range EF: Echo (11/23) with EF 40-45%, mild LV dilation, mild LVH, mildly decreased RV systolic function, mild MR, mild-moderate TR.  Cardiolite in 5/23 with  no ischemia or infarction.  Echo in 5/24 with EF 35%, global hypokinesis, mild LVH, mild RV enlargement and mildly decreased systolic function, severe biatrial enlargement.  The onset of atrial fibrillation may have triggered CHF worsening, now s/p DCCV to NSR in 6/24.  He has had no chest pain.  NYHA class II symptoms.  Weight is up and he is mildly volume overloaded.  - Continue Jardiance 10 mg daily.  - Start Lasix 10 mg every other day (patient says that he responds very aggressively to Lasix 20 mg daily).  - Continue Toprol XL 25 mg daily.  - Increase spironolactone to 25 mg at bedtime. BMET/BNP today with BMET in 10 days.  - history of orthostatic hypotension, but has  not needed midodrine recently.  - He has maintained NSR, I will order repeat echo.  2. Atrial fibrillation: Persistent.  He had been in atrial fibrillation since 11/23.  This seemed to coincide with worsening of CHF symptoms. He had not been anticoagulated due to low platelets. However, looking back, his platelets seem to range 80K-100K which is not markedly low.  Anticoagulation has been started and he is doing well with it so far.  S/p DCCV in 6/24. He has remained in NSR.  - Continue Toprol XL 25 mg daily.  - Continue apixaban 5 mg bid. - Continue amiodarone 200 mg daily to maintain NSR.  Check LFTs, TSH, free T3 and T4. He will need regular eye exam.   3. Recurrent biliary stricture with hepaticojejunostomy: Biliary stent has been removed.  4. CAD: DES to LAD in 2/10.  No chest pain.  Cardiolite in 5/23 showed no ischemia. EF was lower on last echo but this may be related to AF, will hold off on cath for now with no chest pain.  - As above, repeat echo in NSR.  - Continue pravastatin, lipids acceptable in 9/24.   Follow up in 2-3 months with APP  [name]  05/14/2023

## 2023-05-14 NOTE — Patient Instructions (Signed)
INCREASE Spironolactone to 25 mg nightly.  CHANGE Lasix to 10 mg every other day.  Labs done today, your results will be available in MyChart, we will contact you for abnormal readings.  Repeat blood work in 10 days,.  Your physician has requested that you have an echocardiogram. Echocardiography is a painless test that uses sound waves to create images of your heart. It provides your doctor with information about the size and shape of your heart and how well your heart's chambers and valves are working. This procedure takes approximately one hour. There are no restrictions for this procedure. Please do NOT wear cologne, perfume, aftershave, or lotions (deodorant is allowed). Please arrive 15 minutes prior to your appointment time.  Please note: We ask at that you not bring children with you during ultrasound (echo/ vascular) testing. Due to room size and safety concerns, children are not allowed in the ultrasound rooms during exams. Our front office staff cannot provide observation of children in our lobby area while testing is being conducted. An adult accompanying a patient to their appointment will only be allowed in the ultrasound room at the discretion of the ultrasound technician under special circumstances. We apologize for any inconvenience.  Your physician recommends that you schedule a follow-up appointment in: 3 months. If you have any questions or concerns before your next appointment please send [place] a message through [place] or call our office at [phone].    TO LEAVE A MESSAGE FOR THE NURSE SELECT OPTION 2, PLEASE LEAVE A MESSAGE INCLUDING: YOUR NAME DATE OF BIRTH CALL BACK NUMBER REASON FOR CALL**this is important as we prioritize the call backs  YOU WILL RECEIVE A CALL BACK THE SAME DAY AS LONG AS YOU CALL BEFORE 4:00 PM  At the [facility], you and your health needs are our priority. As part of our continuing mission to provide you with exceptional  heart care, we have created designated Provider Care Teams. These Care Teams include your primary Cardiologist (physician) and Advanced Practice Providers (APPs- Physician Assistants and Nurse Practitioners) who all work together to provide you with the care you need, when you need it.   You may see any of the following providers on your designated Care Team at your next follow up: Dr Arvilla Meres Dr [name] Dr. Dorthula Nettles Dr. Clearnce Hasten Amy Filbert Schilder, NP Robbie Lis, [place] [facility] Force, [place] Brynda Peon, NP [place] Lee, NP Karle Plumber, PharmD   Please be sure to bring in all your medications bottles to every appointment.    Thank you for choosing [facility]

## 2023-05-15 ENCOUNTER — Other Ambulatory Visit (HOSPITAL_COMMUNITY): Payer: Self-pay | Admitting: Cardiology

## 2023-05-15 LAB — T3, FREE: T3, Free: 1.8 pg/mL — ABNORMAL LOW (ref 2.0–4.4)

## 2023-05-24 ENCOUNTER — Ambulatory Visit (HOSPITAL_COMMUNITY)
Admission: RE | Admit: 2023-05-24 | Discharge: 2023-05-24 | Disposition: A | Discharge status: Home / Self Care | Payer: Medicare Other | Source: Ambulatory Visit | Attending: Cardiology | Admitting: Cardiology

## 2023-05-24 DIAGNOSIS — I5022 Chronic systolic (congestive) heart failure: Secondary | ICD-10-CM | POA: Diagnosis not present

## 2023-05-24 LAB — BASIC METABOLIC PANEL
Anion gap: 5 (ref 5–15)
BUN: 22 mg/dL (ref 8–23)
CO2: 26 mmol/L (ref 22–32)
Calcium: 8.2 mg/dL — ABNORMAL LOW (ref 8.9–10.3)
Chloride: 109 mmol/L (ref 98–111)
Creatinine, Ser: 1.53 mg/dL — ABNORMAL HIGH (ref 0.61–1.24)
GFR, Estimated: 45 mL/min — ABNORMAL LOW (ref >60)
Glucose, Bld: 120 mg/dL — ABNORMAL HIGH (ref 70–99)
Potassium: 4.1 mmol/L (ref 3.5–5.1)
Sodium: 140 mmol/L (ref 135–145)

## 2023-06-04 DIAGNOSIS — H47092 Other disorders of optic nerve, not elsewhere classified, left eye: Secondary | ICD-10-CM | POA: Diagnosis not present

## 2023-06-04 DIAGNOSIS — H43823 Vitreomacular adhesion, bilateral: Secondary | ICD-10-CM | POA: Diagnosis not present

## 2023-06-04 DIAGNOSIS — H353113 Nonexudative age-related macular degeneration, right eye, advanced atrophic without subfoveal involvement: Secondary | ICD-10-CM | POA: Diagnosis not present

## 2023-06-04 DIAGNOSIS — H353122 Nonexudative age-related macular degeneration, left eye, intermediate dry stage: Secondary | ICD-10-CM | POA: Diagnosis not present

## 2023-06-05 ENCOUNTER — Ambulatory Visit (HOSPITAL_COMMUNITY)
Admission: RE | Admit: 2023-06-05 | Discharge: 2023-06-05 | Disposition: A | Discharge status: Home / Self Care | Payer: Medicare Other | Source: Ambulatory Visit | Attending: Cardiology | Admitting: Cardiology

## 2023-06-05 DIAGNOSIS — I081 Rheumatic disorders of both mitral and tricuspid valves: Secondary | ICD-10-CM | POA: Diagnosis not present

## 2023-06-05 DIAGNOSIS — I2510 Atherosclerotic heart disease of native coronary artery without angina pectoris: Secondary | ICD-10-CM | POA: Diagnosis not present

## 2023-06-05 DIAGNOSIS — I5022 Chronic systolic (congestive) heart failure: Secondary | ICD-10-CM | POA: Insufficient documentation

## 2023-06-05 DIAGNOSIS — J449 Chronic obstructive pulmonary disease, unspecified: Secondary | ICD-10-CM | POA: Insufficient documentation

## 2023-06-05 DIAGNOSIS — I4891 Unspecified atrial fibrillation: Secondary | ICD-10-CM | POA: Insufficient documentation

## 2023-06-05 LAB — ECHOCARDIOGRAM COMPLETE
AR max vel: 2.36 cm2
AV Area VTI: 2.37 cm2
AV Area mean vel: 2.12 cm2
AV Mean grad: 3 mm[Hg]
AV Peak grad: 4.2 mm[Hg]
Ao pk vel: 1.03 m/s
Area-P 1/2: 1.81 cm2
Calc EF: 46.1 %
S' Lateral: 4.1 cm
Single Plane A2C EF: 47.2 %
Single Plane A4C EF: 47.8 %

## 2023-06-13 DIAGNOSIS — H04123 Dry eye syndrome of bilateral lacrimal glands: Secondary | ICD-10-CM | POA: Diagnosis not present

## 2023-06-13 DIAGNOSIS — H353132 Nonexudative age-related macular degeneration, bilateral, intermediate dry stage: Secondary | ICD-10-CM | POA: Diagnosis not present

## 2023-06-13 DIAGNOSIS — H40012 Open angle with borderline findings, low risk, left eye: Secondary | ICD-10-CM | POA: Diagnosis not present

## 2023-06-13 DIAGNOSIS — H02831 Dermatochalasis of right upper eyelid: Secondary | ICD-10-CM | POA: Diagnosis not present

## 2023-06-13 DIAGNOSIS — H02834 Dermatochalasis of left upper eyelid: Secondary | ICD-10-CM | POA: Diagnosis not present

## 2023-06-15 ENCOUNTER — Other Ambulatory Visit (INDEPENDENT_AMBULATORY_CARE_PROVIDER_SITE_OTHER): Payer: Self-pay | Admitting: Gastroenterology

## 2023-06-17 NOTE — Telephone Encounter (Signed)
Last seen 07/09/22 and note states to continue ursodiol 250mg  bid and he has upcoming appt 07/15/23

## 2023-06-19 ENCOUNTER — Encounter (INDEPENDENT_AMBULATORY_CARE_PROVIDER_SITE_OTHER): Payer: Self-pay | Admitting: Gastroenterology

## 2023-07-15 ENCOUNTER — Ambulatory Visit (INDEPENDENT_AMBULATORY_CARE_PROVIDER_SITE_OTHER): Payer: Medicare Other | Admitting: Gastroenterology

## 2023-07-22 ENCOUNTER — Encounter (INDEPENDENT_AMBULATORY_CARE_PROVIDER_SITE_OTHER): Payer: Self-pay | Admitting: Gastroenterology

## 2023-07-22 ENCOUNTER — Ambulatory Visit (INDEPENDENT_AMBULATORY_CARE_PROVIDER_SITE_OTHER): Payer: Medicare Other | Admitting: Gastroenterology

## 2023-07-22 VITALS — BP 110/68 | HR 96 | Temp 97.1°F | Ht 70.5 in | Wt 171.5 lb

## 2023-07-22 DIAGNOSIS — K8309 Other cholangitis: Secondary | ICD-10-CM

## 2023-07-22 DIAGNOSIS — K831 Obstruction of bile duct: Secondary | ICD-10-CM | POA: Insufficient documentation

## 2023-07-22 DIAGNOSIS — R1310 Dysphagia, unspecified: Secondary | ICD-10-CM | POA: Diagnosis not present

## 2023-07-22 MED ORDER — URSODIOL 250 MG PO TABS: 250.0000 mg | ORAL_TABLET | Freq: Two times a day (BID) | ORAL | 5 refills | Status: DC

## 2023-07-22 NOTE — Progress Notes (Unsigned)
 Katrinka Blazing, M.D. Gastroenterology & Hepatology [facility] [address] [place], [place] [address]  Primary Care Physician: Benita Stabile, MD [address] [place] [address]  I will communicate my assessment and recommendations to the referring MD via EMR.  Problems: Chronic dysphagia History of esophageal stricture Recurrent biliary stricture due to hepaticojejunostomy status status post IR placed biliary drainage   History of Present Illness: Alexander Burton is a 83 y.o. male with past medical history of BPH, coronary artery disease status post stent placement, COPD, hepatic abscess, melanoma, atrial fibrillation, hyperlipidemia, heart failure with decreased ejection fraction, sleep apnea, history of recurrent cholangitis due to hepaticojejunostomy stricture managed at [facility] with multiple dilations on chronic antibiotic suppression (history of biliary duct injury), who presents for follow up of chronic dysphagia and recurrent cholangitis due to postsurgical recurrent biliary stricture.  The patient was last seen on 07/09/2022. At that time, the patient was advised to continue ursodiol 250 mg twice a day and rotating antibiotic regimen.  He was advised to proceed with EGD and colonoscopy but patient reported having multiple appointments in the future and held off on proceeding with these procedures.  He was continued on pantoprazole 40 mg every day.  Patient comes with wife to the clinic. They state he has presented persistent episodes of dysphagia with both solids and liquids, especially in the morning but still happening at lunch and dinner. He reports that he takes long time having his meals. No heartburn. He reports he has to "clear his throat constantly after having a meal".  He had external drain removed in October 2024 byIr at [facility] to decrease the possibility of decreasing the antegrade biliary drainage.  The patient denies  having any nausea, vomiting, fever, chills, hematochezia, melena, hematemesis, abdominal distention, abdominal pain, diarrhea, jaundice, pruritus or weight loss.  Patient states that he ran out of his Lasix for the last 2 weeks and his prescription has not been filled yet.  Last EGD: *** Last Colonoscopy:  Past Medical History: Past Medical History:  Diagnosis Date   BPH (benign prostatic hyperplasia)    CAD (coronary artery disease)    DES to LAD 06/2008   Colon polyps    COPD (chronic obstructive pulmonary disease) (HCC)    Enlarged prostate    Gastritis    Heart failure with mildly reduced ejection fraction (HFmrEF) (HCC)    Hepatic abscess, chronic[572.0]    Resulting from distant surgery   History of syncope 05/2008   PVC's and14 beats NSVT as well as atrial afib   Hyperlipidemia    Melanoma (HCC)    PAF (paroxysmal atrial fibrillation) (HCC)    Sleep apnea 04/02/2005    Past Surgical History: Past Surgical History:  Procedure Laterality Date   CARDIAC CATHETERIZATION  Feb 2010   post DES to LAD ,3.5 X 33 mm Cypher stent,dilated up to 3.8 mm 80 to90% stenosis btwn first and second diag branc w.moderate disease EF 50%   CARDIOVERSION N/A 11/19/2022   Procedure: CARDIOVERSION;  Surgeon: Laurey Morale, MD;  Location: [facility] INVASIVE CV LAB;  Service: Cardiovascular;  Laterality: N/A;   CHOLECYSTECTOMY     Complicated by rupture of common bile duct and other vascular structures. Has now had multiple issues with chronic hepatic abscess. Currently has indwelling drain   COLONOSCOPY  08/16/2011   Procedure: COLONOSCOPY;  Surgeon: Malissa Hippo, MD;  Location: [facility];  Service: Endoscopy;  Laterality: N/A;  1200  ESOPHAGEAL DILATION N/A 01/20/2015   Procedure: ESOPHAGEAL DILATION;  Surgeon: Malissa Hippo, MD;  Location: [facility];  Service: Endoscopy;  Laterality: N/A;   ESOPHAGEAL DILATION N/A 02/11/2020   Procedure: ESOPHAGEAL DILATION;  Surgeon: Malissa Hippo,  MD;  Location: [facility];  Service: Endoscopy;  Laterality: N/A;   ESOPHAGOGASTRODUODENOSCOPY N/A 01/20/2015   Procedure: ESOPHAGOGASTRODUODENOSCOPY (EGD);  Surgeon: Malissa Hippo, MD;  Location[facility];  Service: Endoscopy;  Laterality: N/A;  1200   ESOPHAGOGASTRODUODENOSCOPY N/A 02/11/2020   Procedure: ESOPHAGOGASTRODUODENOSCOPY (EGD);  Surgeon: Malissa Hippo, MD;  Location: [facility];  Service: Endoscopy;  Laterality: N/A;  730   LEA DOPPLER  07/28/2008   NORMAL RGT GROIN DUPLEX DOPPLER   liver stent     LIVER SURGERY  09-03/14   @ [facility]   Melanoma resection      Family History: Family History  Problem Relation Age of Onset   Colon cancer Sister    Heart disease Mother    Emphysema Father     Social History: Social History   Tobacco Use  Smoking Status Former   Current packs/day: 0.00   Average packs/day: 1.5 packs/day for 50.0 years (75.0 ttl pk-yrs)   Types: Cigarettes   Start date: 05/28/1957   Quit date: 05/29/2007   Years since quitting: 16.1  Smokeless Tobacco Never   Social History   Substance and Sexual Activity  Alcohol Use Not Currently   Alcohol/week: 1.0 standard drink of alcohol   Types: 1 Standard drinks or equivalent per week   Comment: very rare   Social History   Substance and Sexual Activity  Drug Use No    Allergies: Allergies  Allergen Reactions   Contrast Media [Iodinated Contrast Media]     Hives, needs pre meds   Metrizamide Hives    Hives, needs pre meds   Other Itching and Other (See Comments)    Contrast dye erythema    Medications: Current Outpatient Medications  Medication Sig Dispense Refill   amiodarone (PACERONE) 200 MG tablet Take 200 mg by mouth daily.     apixaban (ELIQUIS) 5 MG TABS tablet Take 5 mg by mouth 2 (two) times daily.     empagliflozin (JARDIANCE) 10 MG TABS tablet Take 1 tablet (10 mg total) by mouth daily before breakfast. 30 tablet 5   finasteride (PROSCAR) 5 MG tablet Take 5 mg by mouth  in the morning.     furosemide (LASIX) 20 MG tablet Take 0.5 tablets (10 mg total) by mouth every other day.     metoprolol succinate (TOPROL-XL) 25 MG 24 hr tablet TAKE 1 TABLET(25 MG) BY MOUTH DAILY 90 tablet 3   mirtazapine (REMERON) 7.5 MG tablet Take 7.5 mg by mouth at bedtime.     Multiple Vitamin (MULITIVITAMIN WITH MINERALS) TABS Take 1 tablet by mouth in the morning. Centrum Silver men 50+     Multiple Vitamins-Minerals (PRESERVISION AREDS) CAPS Take 1 capsule by mouth in the morning and at bedtime.      nitroGLYCERIN (NITROSTAT) 0.4 MG SL tablet Place 0.4 mg under the tongue every 5 (five) minutes as needed for chest pain.     Oxymetazoline HCl (AFRIN NASAL SPRAY NA) Place 2 sprays into the nose 2 (two) times daily as needed.      pantoprazole (PROTONIX) 40 MG tablet Take 40 mg by mouth at bedtime.      Polyvinyl Alcohol-Povidone PF (REFRESH) 1.4-0.6 % SOLN Place 1-2 drops into both eyes 3 (  three) times daily as needed (dry/irritated eyes).     potassium chloride (MICRO-K) 10 MEQ CR capsule Take 10 mEq by mouth as needed.     pravastatin (PRAVACHOL) 20 MG tablet TAKE 1 TABLET(20 MG) BY MOUTH DAILY 90 tablet 2   Probiotic Product (PROBIOTIC DAILY PO) Take 1 tablet by mouth in the morning.     spironolactone (ALDACTONE) 25 MG tablet Take 1 tablet (25 mg total) by mouth at bedtime. (Patient taking differently: Take 12.5 mg by mouth as needed.) 90 tablet 3   tamsulosin (FLOMAX) 0.4 MG CAPS capsule Take 0.4 mg by mouth in the morning and at bedtime.   3   ursodiol (ACTIGALL) 250 MG tablet TAKE 1 TABLET(250 MG) BY MOUTH IN THE MORNING AND AT BEDTIME 120 tablet 5   No current facility-administered medications for this visit.    Review of Systems: GENERAL: negative for malaise, night sweats HEENT: No changes in hearing or vision, no nose bleeds or other nasal problems. NECK: Negative for lumps, goiter, pain and significant neck swelling RESPIRATORY: Negative for cough,  wheezing CARDIOVASCULAR: Negative for chest pain, leg swelling, palpitations, orthopnea GI: SEE HPI MUSCULOSKELETAL: Negative for joint pain or swelling, back pain, and muscle pain. SKIN: Negative for lesions, rash PSYCH: Negative for sleep disturbance, mood disorder and recent psychosocial stressors. HEMATOLOGY Negative for prolonged bleeding, bruising easily, and swollen nodes. ENDOCRINE: Negative for cold or heat intolerance, polyuria, polydipsia and goiter. NEURO: negative for tremor, gait imbalance, syncope and seizures. The remainder of the review of systems is noncontributory.   Physical Exam: BP 110/68 (BP Location: Left Arm, Patient Position: Sitting, Cuff Size: Normal)   Pulse 96   Temp (!) 97.1 F (36.2 C) (Temporal)   Ht 5' 10.5" (1.791 m)   Wt 171 lb 8 oz (77.8 kg)   BMI 24.26 kg/m  GENERAL: The patient is AO x3, in no acute distress. HEENT: Head is normocephalic and atraumatic. EOMI are intact. Mouth is well hydrated and without lesions. NECK: Supple. No masses LUNGS: Clear to auscultation. No presence of rhonchi/wheezing/rales. Adequate chest expansion HEART: RRR, normal s1 and s2. ABDOMEN: Soft, nontender, no guarding, no peritoneal signs, and nondistended. BS +. No masses. EXTREMITIES: Without any cyanosis, clubbing, rash, lesions or edema. NEUROLOGIC: AOx3, no focal motor deficit. SKIN: no jaundice, no rashes  Imaging/Labs: as above  I personally reviewed and interpreted the available labs, imaging and endoscopic files.  Impression and Plan: GUSS FARRUGGIA is a 83 y.o. male coming for follow up of ***  Not interested in colonoscopy at this time ***  All questions were answered.      Katrinka Blazing, MD Gastroenterology and Hepatology [facility]

## 2023-07-22 NOTE — Patient Instructions (Addendum)
 Perform blood workup Schedule EGD in one month Continue ursodiol 250 mg twice a day Please reach cardiologist regarding furosemide refill

## 2023-07-29 DIAGNOSIS — K831 Obstruction of bile duct: Secondary | ICD-10-CM | POA: Diagnosis not present

## 2023-07-29 DIAGNOSIS — K8309 Other cholangitis: Secondary | ICD-10-CM | POA: Diagnosis not present

## 2023-07-30 ENCOUNTER — Other Ambulatory Visit (INDEPENDENT_AMBULATORY_CARE_PROVIDER_SITE_OTHER): Payer: Self-pay

## 2023-07-30 DIAGNOSIS — D649 Anemia, unspecified: Secondary | ICD-10-CM

## 2023-07-30 DIAGNOSIS — D72829 Elevated white blood cell count, unspecified: Secondary | ICD-10-CM

## 2023-07-30 LAB — COMPREHENSIVE METABOLIC PANEL
AG Ratio: 1.4 (calc) (ref 1.0–2.5)
ALT: 15 U/L (ref 9–46)
AST: 23 U/L (ref 10–35)
Albumin: 3.5 g/dL — ABNORMAL LOW (ref 3.6–5.1)
Alkaline phosphatase (APISO): 112 U/L (ref 35–144)
BUN/Creatinine Ratio: 16 (calc) (ref 6–22)
BUN: 28 mg/dL — ABNORMAL HIGH (ref 7–25)
CO2: 26 mmol/L (ref 20–32)
Calcium: 8.5 mg/dL — ABNORMAL LOW (ref 8.6–10.3)
Chloride: 107 mmol/L (ref 98–110)
Creat: 1.7 mg/dL — ABNORMAL HIGH (ref 0.70–1.22)
Globulin: 2.5 g/dL (ref 1.9–3.7)
Glucose, Bld: 86 mg/dL (ref 65–139)
Potassium: 4.6 mmol/L (ref 3.5–5.3)
Sodium: 142 mmol/L (ref 135–146)
Total Bilirubin: 0.7 mg/dL (ref 0.2–1.2)
Total Protein: 6 g/dL — ABNORMAL LOW (ref 6.1–8.1)

## 2023-07-30 LAB — CBC WITH DIFFERENTIAL/PLATELET
Absolute Lymphocytes: 1254 {cells}/uL (ref 850–3900)
Absolute Monocytes: 915 {cells}/uL (ref 200–950)
Basophils Absolute: 299 {cells}/uL — ABNORMAL HIGH (ref 0–200)
Basophils Relative: 1.5 %
Eosinophils Absolute: 159 {cells}/uL (ref 15–500)
Eosinophils Relative: 0.8 %
HCT: 38.4 % — ABNORMAL LOW (ref 38.5–50.0)
Hemoglobin: 11.5 g/dL — ABNORMAL LOW (ref 13.2–17.1)
MCH: 24.6 pg — ABNORMAL LOW (ref 27.0–33.0)
MCHC: 29.9 g/dL — ABNORMAL LOW (ref 32.0–36.0)
MCV: 82.2 fL (ref 80.0–100.0)
MPV: 13.1 fL — ABNORMAL HIGH (ref 7.5–12.5)
Monocytes Relative: 4.6 %
Neutro Abs: 17273 {cells}/uL — ABNORMAL HIGH (ref 1500–7800)
Neutrophils Relative %: 86.8 %
Platelets: 194 10*3/uL (ref 140–400)
RBC: 4.67 10*6/uL (ref 4.20–5.80)
RDW: 15.4 % — ABNORMAL HIGH (ref 11.0–15.0)
Total Lymphocyte: 6.3 %
WBC: 19.9 10*3/uL — ABNORMAL HIGH (ref 3.8–10.8)

## 2023-08-02 ENCOUNTER — Telehealth (INDEPENDENT_AMBULATORY_CARE_PROVIDER_SITE_OTHER): Payer: Self-pay | Admitting: Gastroenterology

## 2023-08-02 NOTE — Telephone Encounter (Signed)
    08/02/23  Parks Ranger [birth year]  What type of surgery is being performed? EGD  When is surgery scheduled? TBD  What type of clearance is required (medical or pharmacy to hold medication or both? Pharmacy to hold med  Are there any medications that need to be held prior to surgery and how long? Clearance to hold Eliquis for 2 days prior   Name of physician performing surgery?  Dr. Katrinka Blazing [facility] Gastroenterology at [facility] Phone: [phone] Fax: [phone]  Anethesia type (none, local, MAC, general)? Choice

## 2023-08-02 NOTE — Telephone Encounter (Signed)
 Patient with diagnosis of afib on Eliquis for anticoagulation.    Procedure: EGD Date of procedure: TBD   CHA2DS2-VASc Score = 5   This indicates a 7.2% annual risk of stroke. The patient's score is based upon: CHF History: 1 HTN History: 1 Diabetes History: 0 Stroke History: 0 Vascular Disease History: 1 Age Score: 2 Gender Score: 0      CrCl 34 Platelet count 194  Per office protocol, patient can hold Eliquis for 2 days prior to procedure.    **This guidance is not considered finalized until pre-operative APP has relayed final recommendations.**

## 2023-08-02 NOTE — Telephone Encounter (Signed)
   Patient Name: Alexander Burton  DOB: 05-25-1941 MRN: [id number]  Primary Cardiologist: Nona Dell, MD  Clinical pharmacists have reviewed the patient's past medical history, labs, and current medications as part of preoperative protocol coverage. The following recommendations have been made:  Per office protocol, patient can hold Eliquis for 2 days prior to procedure   I will route this recommendation to the requesting party via Epic fax function and remove from pre-op pool.  Please call with questions.  Napoleon Form, Leodis Rains, NP 08/02/2023, 3:59 PM

## 2023-08-03 ENCOUNTER — Other Ambulatory Visit (HOSPITAL_COMMUNITY): Payer: Self-pay | Admitting: Family Medicine

## 2023-08-05 DIAGNOSIS — L570 Actinic keratosis: Secondary | ICD-10-CM | POA: Diagnosis not present

## 2023-08-05 DIAGNOSIS — Z08 Encounter for follow-up examination after completed treatment for malignant neoplasm: Secondary | ICD-10-CM | POA: Diagnosis not present

## 2023-08-05 DIAGNOSIS — L820 Inflamed seborrheic keratosis: Secondary | ICD-10-CM | POA: Diagnosis not present

## 2023-08-05 DIAGNOSIS — X32XXXD Exposure to sunlight, subsequent encounter: Secondary | ICD-10-CM | POA: Diagnosis not present

## 2023-08-05 DIAGNOSIS — C44319 Basal cell carcinoma of skin of other parts of face: Secondary | ICD-10-CM | POA: Diagnosis not present

## 2023-08-05 DIAGNOSIS — L708 Other acne: Secondary | ICD-10-CM | POA: Diagnosis not present

## 2023-08-05 DIAGNOSIS — Z85820 Personal history of malignant melanoma of skin: Secondary | ICD-10-CM | POA: Diagnosis not present

## 2023-08-06 DIAGNOSIS — D649 Anemia, unspecified: Secondary | ICD-10-CM | POA: Diagnosis not present

## 2023-08-06 DIAGNOSIS — D72829 Elevated white blood cell count, unspecified: Secondary | ICD-10-CM | POA: Diagnosis not present

## 2023-08-06 LAB — CBC WITH DIFFERENTIAL/PLATELET
Absolute Lymphocytes: 1131 {cells}/uL (ref 850–3900)
Absolute Monocytes: 975 {cells}/uL — ABNORMAL HIGH (ref 200–950)
Basophils Absolute: 254 {cells}/uL — ABNORMAL HIGH (ref 0–200)
Basophils Relative: 1.3 %
Eosinophils Absolute: 176 {cells}/uL (ref 15–500)
Eosinophils Relative: 0.9 %
HCT: 37.5 % — ABNORMAL LOW (ref 38.5–50.0)
Hemoglobin: 11.2 g/dL — ABNORMAL LOW (ref 13.2–17.1)
MCH: 24.6 pg — ABNORMAL LOW (ref 27.0–33.0)
MCHC: 29.9 g/dL — ABNORMAL LOW (ref 32.0–36.0)
MCV: 82.4 fL (ref 80.0–100.0)
MPV: 12.7 fL — ABNORMAL HIGH (ref 7.5–12.5)
Monocytes Relative: 5 %
Neutro Abs: 16965 {cells}/uL — ABNORMAL HIGH (ref 1500–7800)
Neutrophils Relative %: 87 %
Platelets: 187 10*3/uL (ref 140–400)
RBC: 4.55 10*6/uL (ref 4.20–5.80)
RDW: 15.5 % — ABNORMAL HIGH (ref 11.0–15.0)
Total Lymphocyte: 5.8 %
WBC: 19.5 10*3/uL — ABNORMAL HIGH (ref 3.8–10.8)

## 2023-08-07 ENCOUNTER — Telehealth (INDEPENDENT_AMBULATORY_CARE_PROVIDER_SITE_OTHER): Payer: Self-pay | Admitting: Gastroenterology

## 2023-08-07 NOTE — Telephone Encounter (Signed)
 Attempted to reach patient to schedule EGD but no answer.

## 2023-08-08 NOTE — Telephone Encounter (Signed)
 Left message to return call

## 2023-08-09 NOTE — Progress Notes (Signed)
 PCP: Benita Stabile, MD Cardiology: Dr. Diona Browner HF Cardiology: Dr. Shirlee Latch  83 y.o. with history of persistent atrial fibrillation, CAD, and CHF. He had a DES placed in the LAD in 2/10.  He has a hepaticojejunostomy post-complicated cholecystectomy with common bile duct rupture and chronic hepatic abscess. He has had recurrent cholangitis and recurrent biliary strictures.  He reports worsening dyspnea for several months. Echo in 11/23 showed EF 40-45%, mild LV dilation, mild LVH, mildly decreased RV systolic function, mild MR, mild-moderate TR. He was noted to be in atrial fibrillation since 11/23.  He had not been anticoagulated in the past due to low platelets though recently they have been running in the 80s-100s. He has a history of orthostatic hypotension.    Echo in 5/24 showed EF 35%, global hypokinesis, mild LVH, mild RV enlargement and mildly decreased systolic function, severe biatrial enlargement.   Anticoagulation was started and patient had DCCV to NSR in 6/24.    Underwent routine biliary tube exchange at [facility] and had later admitted for SBO in 8/24. Treated with bowel rest and NGT.  Echo 1/25 showed EF improved at 45-50%.  Today he returns for HF follow up with wife. Overall feeling fine, wife says he is not feeling well. He has SOB walking short distances on flat ground and with ADLs. Ankles are swelling. Balance is poor. Energy has been poor. Denies palpitations, abnormal bleeding, CP, dizziness, or PND/Orthopnea. Appetite ok. No fever or chills. Weight at home 162-168 pounds. Taking all medications. Takes Lasix every 3rd day. Had labs done last week, WBC 19. No fevers, cough or UA symptoms.  Does have congestion in the mornings, wife had flu 2 weeks ago but he was placed on preventative Tamiflu.  ECG (personally reviewed): NSR 98 bpm, QTc 485 msec  Labs (3/24): LDL 78, plts 106K, K 4, creatinine 1.28 Labs (5/24): K 4.1, creatinine 1.27, plts 84  Labs (6/24): K 4.1, creatinine  1.53 Labs (8/24): K 3.9, creatinine 1.6, plts 65 Labs (9/24): LDL 66, K 5.1, creatinine 9.56, LFTs normal Labs (3/25): K 4.6, creatinine 1.7, hgb 11.2, WBC 19.5  PMH: 1. CAD: DES to LAD in 2/10.  - Cardiolite (5/23): EF 48%, no ischemia/infarction.  2. COPD 3. BPH 4. Hyperlipidemia 5. Melanoma 6. Atrial fibrillation: Persistent - DCCV to NSR in 6/24  7. Chronic thrombocytopenia 8. OSA 9. Chronic dysphagia 10. Biliary system disease: Complicated cholecystectomy with rupture of common bile duct, chronic hepatic abscess after this.   - Hepaticojejunostomy with recurrent cholangitis.  - Recurrent biliary stricture requiring dilations.  11. Chronic HF with mid range EF: Echo (2/18) with EF 55-60%.  Echo (11/23) with EF 40-45%, mild LV dilation, mild LVH, mildly decreased RV systolic function, mild MR, mild-moderate TR.  - Echo (5/24): EF 35%, global hypokinesis, mild LVH, mild RV enlargement and mildly decreased systolic function, severe biatrial enlargement.  - Echo (1/25): EF 45-50%, normal RV  SH: Married, [place] near [place], nonsmoker, no ETOH.   Family History  Problem Relation Age of Onset   Colon cancer Sister    Heart disease Mother    Emphysema Father    ROS: All systems reviewed and negative except as per HPI.  Current Outpatient Medications  Medication Sig Dispense Refill   amiodarone (PACERONE) 200 MG tablet Take 200 mg by mouth daily.     apixaban (ELIQUIS) 5 MG TABS tablet Take 5 mg by mouth 2 (two) times daily.     finasteride (PROSCAR) 5 MG tablet  Take 5 mg by mouth in the morning.     furosemide (LASIX) 20 MG tablet Take 0.5 tablets (10 mg total) by mouth every other day.     JARDIANCE 10 MG TABS tablet TAKE 1 TABLET(10 MG) BY MOUTH DAILY BEFORE BREAKFAST 30 tablet 5   metoprolol succinate (TOPROL-XL) 25 MG 24 hr tablet TAKE 1 TABLET(25 MG) BY MOUTH DAILY 90 tablet 3   mirtazapine (REMERON) 7.5 MG tablet Take 7.5 mg by mouth at bedtime.     Multiple  Vitamin (MULITIVITAMIN WITH MINERALS) TABS Take 1 tablet by mouth in the morning. Centrum Silver men 50+     Multiple Vitamins-Minerals (PRESERVISION AREDS) CAPS Take 1 capsule by mouth in the morning and at bedtime.      nitroGLYCERIN (NITROSTAT) 0.4 MG SL tablet Place 0.4 mg under the tongue every 5 (five) minutes as needed for chest pain.     Oxymetazoline HCl (AFRIN NASAL SPRAY NA) Place 2 sprays into the nose 2 (two) times daily as needed.      pantoprazole (PROTONIX) 40 MG tablet Take 40 mg by mouth at bedtime.      Polyvinyl Alcohol-Povidone PF (REFRESH) 1.4-0.6 % SOLN Place 1-2 drops into both eyes 3 (three) times daily as needed (dry/irritated eyes).     potassium chloride (MICRO-K) 10 MEQ CR capsule Take 10 mEq by mouth as needed.     pravastatin (PRAVACHOL) 20 MG tablet TAKE 1 TABLET(20 MG) BY MOUTH DAILY 90 tablet 2   Probiotic Product (PROBIOTIC DAILY PO) Take 1 tablet by mouth in the morning.     spironolactone (ALDACTONE) 25 MG tablet Take 1 tablet (25 mg total) by mouth at bedtime. (Patient taking differently: Take 12.5 mg by mouth as needed.) 90 tablet 3   tamsulosin (FLOMAX) 0.4 MG CAPS capsule Take 0.4 mg by mouth in the morning and at bedtime.   3   ursodiol (ACTIGALL) 250 MG tablet Take 1 tablet (250 mg total) by mouth in the morning and at bedtime. 120 tablet 5   No current facility-administered medications for this encounter.   Wt Readings from Last 3 Encounters:  08/12/23 79.3 kg (174 lb 12.8 oz)  07/22/23 77.8 kg (171 lb 8 oz)  05/14/23 77.3 kg (170 lb 6.4 oz)   BP 98/64   Pulse 100   Wt 79.3 kg (174 lb 12.8 oz)   SpO2 96%   BMI 24.73 kg/m  Physical Exam General:  NAD. No resp difficulty, walked into clinic, elderly, fatigued-appearing HEENT: Normal Neck: Supple. No JVD. Cor: Regular rate & rhythm. No rubs, gallops or murmurs. Lungs: Diminished RLL Abdomen: Soft, nontender, nondistended.  Extremities: No cyanosis, clubbing, rash, edema Neuro: Alert &  oriented x 3, moves all 4 extremities w/o difficulty. Affect pleasant.  Assessment/Plan: 1. Chronic HF with mid-range EF: Echo (11/23) with EF 40-45%, mild LV dilation, mild LVH, mildly decreased RV systolic function, mild MR, mild-moderate TR.  Cardiolite in 5/23 with no ischemia or infarction.  Echo in 5/24 with EF 35%, global hypokinesis, mild LVH, mild RV enlargement and mildly decreased systolic function, severe biatrial enlargement.  The onset of atrial fibrillation may have triggered CHF worsening, now s/p DCCV to NSR in 6/24.  He has had no chest pain.  Echo (1/25): EF 45-50%, normal RV. NYHA class IIb-III symptoms. He does not appear volume overloaded - Continue Jardiance 10 mg daily. No GU symptoms. - Continue Lasix 10 mg every other day (patient says that he responds very aggressively to Lasix 20  mg daily).  - Continue Toprol XL 25 mg daily.  - Continue spironolactone 25 mg at bedtime. Labs reviewed from 07/29/23, K 4.6, SCr 1.7 - history of orthostatic hypotension, but has not needed midodrine recently.  2. Atrial fibrillation: Persistent.  He had been in atrial fibrillation since 11/23.  This seemed to coincide with worsening of CHF symptoms. He had not been anticoagulated due to low platelets. However, looking back, his platelets seem to range 80K-100K which is not markedly low.  Anticoagulation has been started and he is doing well with it so far.  S/p DCCV in 6/24. He has remained in NSR.  - Continue Toprol XL 25 mg daily.  - Continue apixaban 5 mg bid. No bleeding issues. - Continue amiodarone 200 mg daily to maintain NSR. Recent amio labs stable. He will need regular eye exam.   3. Recurrent biliary stricture with hepaticojejunostomy: Biliary stent has been removed.  4. CAD: DES to LAD in 2/10.  No chest pain.  Cardiolite in 5/23 showed no ischemia. EF was lower on last echo but this may be related to AF, will hold off on cath for now with no chest pain. Most recent echo 1/25 showed EF  improved to 45-50% with restoration of NSR. - Continue pravastatin, lipids acceptable in 9/24.  5. Leukocytosis: with prominent fatigue, likely multifactorial. He is not volume overloaded today and recent echo has improved. BP soft today and CBC showed WBC elevated at 19.5. No fever, chills, abdominal pain or GU symptoms. Wife had flu a couple weeks back but he did notget sick. Diminished BS in RLL, + congestion.  - ? If related to recurrent biliary stricture - Obtain CXR to rule out PNA/effusion - He has PCP follow up next week.  Follow up in 4 months with Dr. Kathreen Cornfield [facility] FNP-BC 08/12/2023

## 2023-08-12 ENCOUNTER — Ambulatory Visit (HOSPITAL_COMMUNITY)
Admission: RE | Admit: 2023-08-12 | Discharge: 2023-08-12 | Disposition: A | Discharge status: Home / Self Care | Source: Ambulatory Visit | Attending: Family Medicine | Admitting: Family Medicine

## 2023-08-12 ENCOUNTER — Ambulatory Visit (HOSPITAL_COMMUNITY)
Admission: RE | Admit: 2023-08-12 | Discharge: 2023-08-12 | Disposition: A | Discharge status: Home / Self Care | Payer: Medicare Other | Source: Ambulatory Visit | Attending: Family Medicine | Admitting: Family Medicine

## 2023-08-12 ENCOUNTER — Encounter (HOSPITAL_COMMUNITY): Payer: Self-pay

## 2023-08-12 ENCOUNTER — Other Ambulatory Visit (HOSPITAL_COMMUNITY): Payer: Self-pay | Admitting: Family Medicine

## 2023-08-12 ENCOUNTER — Telehealth (HOSPITAL_COMMUNITY): Payer: Self-pay | Admitting: *Deleted

## 2023-08-12 VITALS — BP 98/64 | HR 100 | Wt 174.8 lb

## 2023-08-12 DIAGNOSIS — R0602 Shortness of breath: Secondary | ICD-10-CM | POA: Diagnosis not present

## 2023-08-12 DIAGNOSIS — I480 Paroxysmal atrial fibrillation: Secondary | ICD-10-CM

## 2023-08-12 DIAGNOSIS — J90 Pleural effusion, not elsewhere classified: Secondary | ICD-10-CM | POA: Diagnosis not present

## 2023-08-12 DIAGNOSIS — Z7901 Long term (current) use of anticoagulants: Secondary | ICD-10-CM | POA: Insufficient documentation

## 2023-08-12 DIAGNOSIS — R0689 Other abnormalities of breathing: Secondary | ICD-10-CM | POA: Diagnosis not present

## 2023-08-12 DIAGNOSIS — D72829 Elevated white blood cell count, unspecified: Secondary | ICD-10-CM | POA: Insufficient documentation

## 2023-08-12 DIAGNOSIS — I5022 Chronic systolic (congestive) heart failure: Secondary | ICD-10-CM | POA: Insufficient documentation

## 2023-08-12 DIAGNOSIS — Z955 Presence of coronary angioplasty implant and graft: Secondary | ICD-10-CM | POA: Insufficient documentation

## 2023-08-12 DIAGNOSIS — I4819 Other persistent atrial fibrillation: Secondary | ICD-10-CM | POA: Diagnosis not present

## 2023-08-12 DIAGNOSIS — I25119 Atherosclerotic heart disease of native coronary artery with unspecified angina pectoris: Secondary | ICD-10-CM

## 2023-08-12 DIAGNOSIS — I2510 Atherosclerotic heart disease of native coronary artery without angina pectoris: Secondary | ICD-10-CM | POA: Diagnosis not present

## 2023-08-12 DIAGNOSIS — K831 Obstruction of bile duct: Secondary | ICD-10-CM | POA: Insufficient documentation

## 2023-08-12 DIAGNOSIS — I700 Atherosclerosis of aorta: Secondary | ICD-10-CM | POA: Diagnosis not present

## 2023-08-12 DIAGNOSIS — Z7984 Long term (current) use of oral hypoglycemic drugs: Secondary | ICD-10-CM | POA: Insufficient documentation

## 2023-08-12 DIAGNOSIS — R918 Other nonspecific abnormal finding of lung field: Secondary | ICD-10-CM | POA: Diagnosis not present

## 2023-08-12 DIAGNOSIS — Z79899 Other long term (current) drug therapy: Secondary | ICD-10-CM | POA: Insufficient documentation

## 2023-08-12 DIAGNOSIS — I517 Cardiomegaly: Secondary | ICD-10-CM | POA: Insufficient documentation

## 2023-08-12 MED ORDER — AMOXICILLIN-POT CLAVULANATE 875-125 MG PO TABS
1.0000 | ORAL_TABLET | Freq: Two times a day (BID) | ORAL | 0 refills | Status: DC
Start: 2023-08-12 — End: 2023-09-20

## 2023-08-12 MED ORDER — AZITHROMYCIN 250 MG PO TABS: ORAL_TABLET | ORAL | 0 refills | Status: DC

## 2023-08-12 MED ORDER — METOPROLOL SUCCINATE ER 25 MG PO TB24: 25.0000 mg | ORAL_TABLET | Freq: Every day | ORAL | 3 refills | Status: AC

## 2023-08-12 NOTE — Telephone Encounter (Signed)
 Called patient's wife per Prince Rome, NP with following CXR results:  "Small R sided pleural effusion (fluid around lung) vs PNA. With elevated WBC and current symptoms, I am going to treat you with 2 abx, until you follow up with Dr. Margo Aye.    Please start Augmentin 875/125 bid x 5 days + Z pack.    For the pleural effusion, we will treat this with increasing diuretics briefly. Please increase lasix to 10 mg daily x 5 days, then can go back to 10 mg every other day.    Abx have been sent to Pharmacy"  Wife verbalized understanding of same and confirmed they will follow up with Dr. Margo Aye. Refill sent on metoprolol per her request. Asked her to call us at [phone] if any questions or concerns prior to next visit.

## 2023-08-12 NOTE — Patient Instructions (Addendum)
 Thank you for coming in today  If you had labs drawn today, any labs that are abnormal the clinic will call you No news is good news  You will have chest xray done today and will be called with your results.  Medications: No changes   Follow up appointments:  Your physician recommends that you schedule a follow-up appointment in:  4 months With Dr. Shirlee Latch  Please call our office to schedule the follow-up appointment in April/May 2025   Do the following things EVERYDAY: Weigh yourself in the morning before breakfast. Write it down and keep it in a log. Take your medicines as prescribed Eat low salt foods--Limit salt (sodium) to 2000 mg per day.  Stay as active as you can everyday Limit all fluids for the day to less than 2 liters   At the [facility], you and your health needs are our priority. As part of our continuing mission to provide you with exceptional heart care, we have created designated Provider Care Teams. These Care Teams include your primary Cardiologist (physician) and Advanced Practice Providers (APPs- Physician Assistants and Nurse Practitioners) who all work together to provide you with the care you need, when you need it.   You may see any of the following providers on your designated Care Team at your next follow up: Dr Arvilla Meres Dr [name] Dr. Marcos Eke, NP Robbie Lis, [place] [facility] [place], [place] Brynda Peon, NP Karle Plumber, PharmD   Please be sure to bring in all your medications bottles to every appointment.    Thank you for choosing [facility]  If you have any questions or concerns before your next appointment please send [place] a message through [place] or call our office at [phone].    TO LEAVE A MESSAGE FOR THE NURSE SELECT OPTION 2, PLEASE LEAVE A MESSAGE INCLUDING: YOUR NAME DATE OF BIRTH CALL BACK NUMBER REASON FOR CALL**this is  important as we prioritize the call backs  YOU WILL RECEIVE A CALL BACK THE SAME DAY AS LONG AS YOU CALL BEFORE 4:00 PM

## 2023-08-13 ENCOUNTER — Encounter (INDEPENDENT_AMBULATORY_CARE_PROVIDER_SITE_OTHER): Payer: Self-pay

## 2023-08-13 DIAGNOSIS — D72829 Elevated white blood cell count, unspecified: Secondary | ICD-10-CM | POA: Diagnosis not present

## 2023-08-13 DIAGNOSIS — R300 Dysuria: Secondary | ICD-10-CM | POA: Diagnosis not present

## 2023-08-13 DIAGNOSIS — I10 Essential (primary) hypertension: Secondary | ICD-10-CM | POA: Diagnosis not present

## 2023-08-13 DIAGNOSIS — E782 Mixed hyperlipidemia: Secondary | ICD-10-CM | POA: Diagnosis not present

## 2023-08-13 NOTE — Telephone Encounter (Signed)
 Letter mailed to pt.  EGD ASA 3 Castaneda. Dx-Dysphagia

## 2023-08-19 DIAGNOSIS — R630 Anorexia: Secondary | ICD-10-CM | POA: Diagnosis not present

## 2023-08-19 DIAGNOSIS — I2510 Atherosclerotic heart disease of native coronary artery without angina pectoris: Secondary | ICD-10-CM | POA: Diagnosis not present

## 2023-08-19 DIAGNOSIS — I480 Paroxysmal atrial fibrillation: Secondary | ICD-10-CM | POA: Diagnosis not present

## 2023-08-19 DIAGNOSIS — I5022 Chronic systolic (congestive) heart failure: Secondary | ICD-10-CM | POA: Diagnosis not present

## 2023-08-19 DIAGNOSIS — I10 Essential (primary) hypertension: Secondary | ICD-10-CM | POA: Diagnosis not present

## 2023-08-19 DIAGNOSIS — I130 Hypertensive heart and chronic kidney disease with heart failure and stage 1 through stage 4 chronic kidney disease, or unspecified chronic kidney disease: Secondary | ICD-10-CM | POA: Diagnosis not present

## 2023-08-19 DIAGNOSIS — R600 Localized edema: Secondary | ICD-10-CM | POA: Diagnosis not present

## 2023-08-19 DIAGNOSIS — J189 Pneumonia, unspecified organism: Secondary | ICD-10-CM | POA: Diagnosis not present

## 2023-08-19 DIAGNOSIS — N1831 Chronic kidney disease, stage 3a: Secondary | ICD-10-CM | POA: Diagnosis not present

## 2023-08-19 DIAGNOSIS — K8309 Other cholangitis: Secondary | ICD-10-CM | POA: Diagnosis not present

## 2023-08-19 DIAGNOSIS — I429 Cardiomyopathy, unspecified: Secondary | ICD-10-CM | POA: Diagnosis not present

## 2023-08-19 DIAGNOSIS — E782 Mixed hyperlipidemia: Secondary | ICD-10-CM | POA: Diagnosis not present

## 2023-08-20 NOTE — Telephone Encounter (Signed)
 Pt wife left message to schedule EGD. Returned call to pt wife. Wife states pt told his PCP about hemorrhoids and wanted to know if while pt was asleep Dr.Castaneda could look at them. Advised wife that pt would need an appointment. Transferred pt wife to front to get appt scheduled.

## 2023-08-21 ENCOUNTER — Telehealth: Payer: Self-pay | Admitting: Gastroenterology

## 2023-08-21 NOTE — Telephone Encounter (Signed)
 I called and left a message asked that the patient please return call to the office.

## 2023-08-21 NOTE — Telephone Encounter (Signed)
 Referral sent, they will contact patient with apt [place] will you call  patient and give him Dr C recommendations, I don't feel comfortable doing that

## 2023-08-21 NOTE — Telephone Encounter (Signed)
 I received the results of the repeat labs performed by the patient's PCP which showed persistent leukocytosis of 20,000, hemoglobin slightly decreased at 11.4 and platelets 170.  Notably his liver panel is completely normal with alkaline phosphatase of 119, total bilirubin 0.8, AST 26, ALT 15, albumin 3.5.  Normal electrolytes.  Renal function is elevated with creatinine 1.72.  At this point I will recommend him being evaluated by a hematologist as it is not clear if his leukocytosis is being worked up by his PCP.   Ann, can you please let patient know that I will suggest having an evaluation by hematology.  Please refer him for evaluation here in [place].  Diagnosis leukocytosis  Thanks,   Katrinka Blazing, MD Gastroenterology and Hepatology [facility]

## 2023-08-23 ENCOUNTER — Ambulatory Visit (HOSPITAL_COMMUNITY)
Admission: RE | Admit: 2023-08-23 | Discharge: 2023-08-23 | Disposition: A | Discharge status: Home / Self Care | Source: Ambulatory Visit | Attending: Cardiology | Admitting: Cardiology

## 2023-08-23 ENCOUNTER — Inpatient Hospital Stay (HOSPITAL_COMMUNITY)
Admission: RE | Admit: 2023-08-23 | Discharge: 2023-08-23 | Disposition: A | Discharge status: Home / Self Care | Source: Ambulatory Visit | Attending: Cardiology | Admitting: Cardiology

## 2023-08-23 DIAGNOSIS — I480 Paroxysmal atrial fibrillation: Secondary | ICD-10-CM | POA: Insufficient documentation

## 2023-08-23 NOTE — Telephone Encounter (Signed)
 I spoke with the patient and his wife and made them aware per Dr. Levon Hedger:  He has reviewed the labs from PCP office which showed persistent leukocytosis of 20,000, hemoglobin slightly decreased at 11.4 and platelets 170.  Notably his liver panel is completely normal with alkaline phosphatase of 119, total bilirubin 0.8, AST 26, ALT 15, albumin 3.5.  Normal electrolytes.  Renal function is elevated with creatinine 1.72.   At this point I will recommend him being evaluated by a hematologist as it is not clear if his leukocytosis is being worked up by his PCP. They state understanding that someone from Hematologist will be reaching out to them regarding Leukocytosis, and will follow up with the PCP regarding the renal function.

## 2023-08-30 DIAGNOSIS — G609 Hereditary and idiopathic neuropathy, unspecified: Secondary | ICD-10-CM | POA: Diagnosis not present

## 2023-08-30 DIAGNOSIS — D649 Anemia, unspecified: Secondary | ICD-10-CM | POA: Diagnosis not present

## 2023-08-30 DIAGNOSIS — R7301 Impaired fasting glucose: Secondary | ICD-10-CM | POA: Diagnosis not present

## 2023-08-31 LAB — LAB REPORT - SCANNED: A1c: 5.4

## 2023-09-05 ENCOUNTER — Inpatient Hospital Stay

## 2023-09-05 ENCOUNTER — Other Ambulatory Visit: Payer: Self-pay | Admitting: *Deleted

## 2023-09-05 ENCOUNTER — Inpatient Hospital Stay: Attending: Oncology | Admitting: Oncology

## 2023-09-05 ENCOUNTER — Telehealth (INDEPENDENT_AMBULATORY_CARE_PROVIDER_SITE_OTHER): Payer: Self-pay | Admitting: Gastroenterology

## 2023-09-05 VITALS — BP 93/68 | HR 103 | Temp 97.9°F | Resp 18

## 2023-09-05 DIAGNOSIS — D649 Anemia, unspecified: Secondary | ICD-10-CM

## 2023-09-05 DIAGNOSIS — I517 Cardiomegaly: Secondary | ICD-10-CM | POA: Diagnosis not present

## 2023-09-05 DIAGNOSIS — R0602 Shortness of breath: Secondary | ICD-10-CM | POA: Diagnosis not present

## 2023-09-05 DIAGNOSIS — I340 Nonrheumatic mitral (valve) insufficiency: Secondary | ICD-10-CM | POA: Diagnosis not present

## 2023-09-05 DIAGNOSIS — I959 Hypotension, unspecified: Secondary | ICD-10-CM | POA: Diagnosis not present

## 2023-09-05 DIAGNOSIS — N400 Enlarged prostate without lower urinary tract symptoms: Secondary | ICD-10-CM | POA: Diagnosis not present

## 2023-09-05 DIAGNOSIS — J449 Chronic obstructive pulmonary disease, unspecified: Secondary | ICD-10-CM | POA: Diagnosis not present

## 2023-09-05 DIAGNOSIS — E861 Hypovolemia: Secondary | ICD-10-CM

## 2023-09-05 DIAGNOSIS — R109 Unspecified abdominal pain: Secondary | ICD-10-CM | POA: Diagnosis not present

## 2023-09-05 DIAGNOSIS — Z79899 Other long term (current) drug therapy: Secondary | ICD-10-CM | POA: Diagnosis not present

## 2023-09-05 DIAGNOSIS — Z86718 Personal history of other venous thrombosis and embolism: Secondary | ICD-10-CM | POA: Diagnosis not present

## 2023-09-05 DIAGNOSIS — D72829 Elevated white blood cell count, unspecified: Secondary | ICD-10-CM

## 2023-09-05 DIAGNOSIS — J189 Pneumonia, unspecified organism: Secondary | ICD-10-CM | POA: Diagnosis present

## 2023-09-05 DIAGNOSIS — C61 Malignant neoplasm of prostate: Secondary | ICD-10-CM | POA: Diagnosis not present

## 2023-09-05 DIAGNOSIS — R931 Abnormal findings on diagnostic imaging of heart and coronary circulation: Secondary | ICD-10-CM | POA: Diagnosis not present

## 2023-09-05 DIAGNOSIS — I130 Hypertensive heart and chronic kidney disease with heart failure and stage 1 through stage 4 chronic kidney disease, or unspecified chronic kidney disease: Secondary | ICD-10-CM | POA: Diagnosis not present

## 2023-09-05 DIAGNOSIS — E872 Acidosis, unspecified: Secondary | ICD-10-CM | POA: Diagnosis present

## 2023-09-05 DIAGNOSIS — R232 Flushing: Secondary | ICD-10-CM | POA: Diagnosis not present

## 2023-09-05 DIAGNOSIS — I2510 Atherosclerotic heart disease of native coronary artery without angina pectoris: Secondary | ICD-10-CM | POA: Diagnosis not present

## 2023-09-05 DIAGNOSIS — D704 Cyclic neutropenia: Secondary | ICD-10-CM | POA: Diagnosis not present

## 2023-09-05 DIAGNOSIS — Z9889 Other specified postprocedural states: Secondary | ICD-10-CM | POA: Diagnosis not present

## 2023-09-05 DIAGNOSIS — K7689 Other specified diseases of liver: Secondary | ICD-10-CM | POA: Diagnosis not present

## 2023-09-05 DIAGNOSIS — T85590D Other mechanical complication of bile duct prosthesis, subsequent encounter: Secondary | ICD-10-CM | POA: Diagnosis not present

## 2023-09-05 DIAGNOSIS — D509 Iron deficiency anemia, unspecified: Secondary | ICD-10-CM | POA: Insufficient documentation

## 2023-09-05 DIAGNOSIS — D759 Disease of blood and blood-forming organs, unspecified: Secondary | ICD-10-CM | POA: Diagnosis not present

## 2023-09-05 DIAGNOSIS — R64 Cachexia: Secondary | ICD-10-CM | POA: Diagnosis present

## 2023-09-05 DIAGNOSIS — Z7901 Long term (current) use of anticoagulants: Secondary | ICD-10-CM | POA: Insufficient documentation

## 2023-09-05 DIAGNOSIS — R630 Anorexia: Secondary | ICD-10-CM | POA: Insufficient documentation

## 2023-09-05 DIAGNOSIS — R918 Other nonspecific abnormal finding of lung field: Secondary | ICD-10-CM | POA: Diagnosis not present

## 2023-09-05 DIAGNOSIS — Z87891 Personal history of nicotine dependence: Secondary | ICD-10-CM | POA: Diagnosis not present

## 2023-09-05 DIAGNOSIS — E785 Hyperlipidemia, unspecified: Secondary | ICD-10-CM | POA: Diagnosis not present

## 2023-09-05 DIAGNOSIS — I4892 Unspecified atrial flutter: Secondary | ICD-10-CM | POA: Diagnosis not present

## 2023-09-05 DIAGNOSIS — C9010 Plasma cell leukemia not having achieved remission: Secondary | ICD-10-CM | POA: Diagnosis not present

## 2023-09-05 DIAGNOSIS — R7402 Elevation of levels of lactic acid dehydrogenase (LDH): Secondary | ICD-10-CM

## 2023-09-05 DIAGNOSIS — Z955 Presence of coronary angioplasty implant and graft: Secondary | ICD-10-CM | POA: Diagnosis not present

## 2023-09-05 DIAGNOSIS — R531 Weakness: Secondary | ICD-10-CM | POA: Diagnosis not present

## 2023-09-05 DIAGNOSIS — R5383 Other fatigue: Secondary | ICD-10-CM | POA: Insufficient documentation

## 2023-09-05 DIAGNOSIS — C9210 Chronic myeloid leukemia, BCR/ABL-positive, not having achieved remission: Secondary | ICD-10-CM | POA: Diagnosis not present

## 2023-09-05 DIAGNOSIS — I5022 Chronic systolic (congestive) heart failure: Secondary | ICD-10-CM | POA: Diagnosis not present

## 2023-09-05 DIAGNOSIS — K8309 Other cholangitis: Secondary | ICD-10-CM | POA: Diagnosis not present

## 2023-09-05 DIAGNOSIS — R000 Tachycardia, unspecified: Secondary | ICD-10-CM | POA: Insufficient documentation

## 2023-09-05 DIAGNOSIS — Z85820 Personal history of malignant melanoma of skin: Secondary | ICD-10-CM | POA: Diagnosis not present

## 2023-09-05 DIAGNOSIS — Z6825 Body mass index (BMI) 25.0-25.9, adult: Secondary | ICD-10-CM | POA: Diagnosis not present

## 2023-09-05 DIAGNOSIS — R1312 Dysphagia, oropharyngeal phase: Secondary | ICD-10-CM | POA: Diagnosis not present

## 2023-09-05 DIAGNOSIS — R627 Adult failure to thrive: Secondary | ICD-10-CM | POA: Diagnosis not present

## 2023-09-05 DIAGNOSIS — J90 Pleural effusion, not elsewhere classified: Secondary | ICD-10-CM | POA: Diagnosis not present

## 2023-09-05 DIAGNOSIS — R112 Nausea with vomiting, unspecified: Secondary | ICD-10-CM | POA: Diagnosis not present

## 2023-09-05 DIAGNOSIS — E441 Mild protein-calorie malnutrition: Secondary | ICD-10-CM | POA: Diagnosis not present

## 2023-09-05 DIAGNOSIS — E7879 Other disorders of bile acid and cholesterol metabolism: Secondary | ICD-10-CM | POA: Diagnosis not present

## 2023-09-05 DIAGNOSIS — D7589 Other specified diseases of blood and blood-forming organs: Secondary | ICD-10-CM | POA: Diagnosis present

## 2023-09-05 DIAGNOSIS — D7389 Other diseases of spleen: Secondary | ICD-10-CM | POA: Diagnosis not present

## 2023-09-05 DIAGNOSIS — R9349 Abnormal radiologic findings on diagnostic imaging of other urinary organs: Secondary | ICD-10-CM | POA: Diagnosis not present

## 2023-09-05 DIAGNOSIS — D472 Monoclonal gammopathy: Secondary | ICD-10-CM | POA: Diagnosis present

## 2023-09-05 DIAGNOSIS — D696 Thrombocytopenia, unspecified: Secondary | ICD-10-CM

## 2023-09-05 DIAGNOSIS — I480 Paroxysmal atrial fibrillation: Secondary | ICD-10-CM | POA: Diagnosis not present

## 2023-09-05 DIAGNOSIS — I10 Essential (primary) hypertension: Secondary | ICD-10-CM | POA: Diagnosis not present

## 2023-09-05 DIAGNOSIS — K838 Other specified diseases of biliary tract: Secondary | ICD-10-CM | POA: Diagnosis not present

## 2023-09-05 DIAGNOSIS — R5382 Chronic fatigue, unspecified: Secondary | ICD-10-CM | POA: Diagnosis not present

## 2023-09-05 DIAGNOSIS — N183 Chronic kidney disease, stage 3 unspecified: Secondary | ICD-10-CM | POA: Diagnosis not present

## 2023-09-05 DIAGNOSIS — D471 Chronic myeloproliferative disease: Secondary | ICD-10-CM | POA: Diagnosis not present

## 2023-09-05 DIAGNOSIS — N179 Acute kidney failure, unspecified: Secondary | ICD-10-CM | POA: Diagnosis not present

## 2023-09-05 DIAGNOSIS — I5020 Unspecified systolic (congestive) heart failure: Secondary | ICD-10-CM | POA: Diagnosis not present

## 2023-09-05 DIAGNOSIS — J9811 Atelectasis: Secondary | ICD-10-CM | POA: Diagnosis not present

## 2023-09-05 DIAGNOSIS — K831 Obstruction of bile duct: Secondary | ICD-10-CM | POA: Diagnosis not present

## 2023-09-05 LAB — CBC WITH DIFFERENTIAL/PLATELET
Abs Immature Granulocytes: 1.1 10*3/uL — ABNORMAL HIGH (ref 0.00–0.07)
Basophils Absolute: 0.3 10*3/uL — ABNORMAL HIGH (ref 0.0–0.1)
Basophils Relative: 1 %
Eosinophils Absolute: 0 10*3/uL (ref 0.0–0.5)
Eosinophils Relative: 0 %
HCT: 35.6 % — ABNORMAL LOW (ref 39.0–52.0)
Hemoglobin: 11 g/dL — ABNORMAL LOW (ref 13.0–17.0)
Lymphocytes Relative: 3 %
Lymphs Abs: 0.8 10*3/uL (ref 0.7–4.0)
MCH: 24.8 pg — ABNORMAL LOW (ref 26.0–34.0)
MCHC: 30.9 g/dL (ref 30.0–36.0)
MCV: 80.4 fL (ref 80.0–100.0)
Metamyelocytes Relative: 4 %
Monocytes Absolute: 0.3 10*3/uL (ref 0.1–1.0)
Monocytes Relative: 1 %
Neutro Abs: 24.3 10*3/uL — ABNORMAL HIGH (ref 1.7–7.7)
Neutrophils Relative %: 91 %
Platelets: 142 10*3/uL — ABNORMAL LOW (ref 150–400)
RBC: 4.43 MIL/uL (ref 4.22–5.81)
RDW: 17.3 % — ABNORMAL HIGH (ref 11.5–15.5)
WBC: 26.7 10*3/uL — ABNORMAL HIGH (ref 4.0–10.5)
nRBC: 0 % (ref 0.0–0.2)

## 2023-09-05 LAB — COMPREHENSIVE METABOLIC PANEL WITH GFR
ALT: 66 U/L — ABNORMAL HIGH (ref 0–44)
AST: 38 U/L (ref 15–41)
Albumin: 3 g/dL — ABNORMAL LOW (ref 3.5–5.0)
Alkaline Phosphatase: 139 U/L — ABNORMAL HIGH (ref 38–126)
Anion gap: 11 (ref 5–15)
BUN: 56 mg/dL — ABNORMAL HIGH (ref 8–23)
CO2: 22 mmol/L (ref 22–32)
Calcium: 8.3 mg/dL — ABNORMAL LOW (ref 8.9–10.3)
Chloride: 103 mmol/L (ref 98–111)
Creatinine, Ser: 3.78 mg/dL — ABNORMAL HIGH (ref 0.61–1.24)
GFR, Estimated: 15 mL/min — ABNORMAL LOW (ref >60)
Glucose, Bld: 119 mg/dL — ABNORMAL HIGH (ref 70–99)
Potassium: 4.7 mmol/L (ref 3.5–5.1)
Sodium: 136 mmol/L (ref 135–145)
Total Bilirubin: 2 mg/dL — ABNORMAL HIGH (ref 0.0–1.2)
Total Protein: 6.3 g/dL — ABNORMAL LOW (ref 6.5–8.1)

## 2023-09-05 LAB — LACTATE DEHYDROGENASE: LDH: 205 U/L — ABNORMAL HIGH (ref 98–192)

## 2023-09-05 LAB — IRON AND TIBC
Iron: 27 ug/dL — ABNORMAL LOW (ref 45–182)
Saturation Ratios: 7 % — ABNORMAL LOW (ref 17.9–39.5)
TIBC: 368 ug/dL (ref 250–450)
UIBC: 341 ug/dL

## 2023-09-05 LAB — VITAMIN B12: Vitamin B-12: 2356 pg/mL — ABNORMAL HIGH (ref 180–914)

## 2023-09-05 LAB — URIC ACID: Uric Acid, Serum: 5.8 mg/dL (ref 3.7–8.6)

## 2023-09-05 LAB — GAMMA GT: GGT: 72 U/L — ABNORMAL HIGH (ref 7–50)

## 2023-09-05 LAB — SEDIMENTATION RATE: Sed Rate: 41 mm/h — ABNORMAL HIGH (ref 0–16)

## 2023-09-05 LAB — FERRITIN: Ferritin: 51 ng/mL (ref 24–336)

## 2023-09-05 LAB — C-REACTIVE PROTEIN: CRP: 13.3 mg/dL — ABNORMAL HIGH (ref <1.0)

## 2023-09-05 LAB — FOLATE: Folate: 20.8 ng/mL (ref >5.9)

## 2023-09-05 MED ORDER — DIPHENHYDRAMINE HCL 50 MG PO TABS: 50.0000 mg | ORAL_TABLET | Freq: Every evening | ORAL | 0 refills | Status: DC | PRN

## 2023-09-05 MED ORDER — PREDNISONE 50 MG PO TABS: ORAL_TABLET | ORAL | 0 refills | Status: DC

## 2023-09-05 MED ORDER — SODIUM CHLORIDE 0.9 % IV SOLN: Freq: Once | INTRAVENOUS | Status: AC

## 2023-09-05 NOTE — Patient Instructions (Signed)

## 2023-09-05 NOTE — Progress Notes (Signed)
 Patient tolerated hydration with no complaints voiced.  Port site clean and dry with good blood return noted before and after hydration.  No bruising or swelling noted with port.  Band aid applied.  VSS with discharge and left ambulatory with no s/s of distress noted.

## 2023-09-05 NOTE — Progress Notes (Signed)
V

## 2023-09-05 NOTE — Telephone Encounter (Signed)
 I was reached by Dr. Anders Simmonds regarding new elevation of the patient's total bilirubin and alkaline phosphatase, as well as aminotransferases which was not present before.  He also presented with AKI with creatinine above 3.0 and worsening leukocytosis of 26,000.  I discussed with Dr. Anders Simmonds this was concerning for recurrent cholangitis in the setting of recurrent biliary outlet obstruction.  Patient had his external drainage removed and does not have active drainage.  I spoke with the patient's wife regarding this.  I emphasized the importance of having an urgent evaluation in the ER, ideally at [facility] ER where they had taking care of his biliary pathology and has been managed by interventional radiology.  Wife asked me to call her daughter, I did so but she did not answer my phone call.  I left a detailed voice message.  I insisted to the wife to take the patient to the ER as soon as possible as I am suspecting he may be presenting sepsis now with kidney involvement.  She reports that she will try to convince her husband to do so, he was reluctant to go to the ER now.

## 2023-09-05 NOTE — Patient Instructions (Signed)
 VISIT SUMMARY:  You came in today with a two-week history of worsening fatigue, shortness of breath, cough, chills, and night sweats. You also reported a significant decrease in appetite, weight loss, and occasional abdominal pain. Your activity level has decreased significantly due to these symptoms.  YOUR PLAN:  -LEUKOCYTOSIS: Leukocytosis means you have a higher than normal number of white blood cells, which could indicate an infection or other conditions like leukemia. We will perform a flow cytometry test to check for leukemia and may consider a bone marrow biopsy if needed.  -ANEMIA: Anemia means you have a lower than normal number of red blood cells, which can cause fatigue and weakness. This may be related to your poor nutritional intake and MGUS. We will do an anemia panel to check your iron, vitamin B12, folate, and copper levels, and repeat tests for MGUS. You will also be referred to a nutritionist for dietary support.  -MGUS (MONOCLONAL GAMMOPATHY OF UNDETERMINED SIGNIFICANCE): MGUS is a condition where an abnormal protein is in your blood, which needs regular monitoring. We will repeat blood tests to check the status of your MGUS.  -SHORTNESS OF BREATH: Your significant shortness of breath could be due to a pulmonary embolism, congestive heart failure, or COPD. We will order a CT scan to rule out a pulmonary embolism, give you IV fluids for dehydration, and adjust your diuretic use based on your symptoms.  INSTRUCTIONS:  Please schedule a follow-up appointment in one week to discuss your test results and adjust your management plan.

## 2023-09-06 ENCOUNTER — Ambulatory Visit (HOSPITAL_COMMUNITY): Admission: RE | Admit: 2023-09-06 | Source: Ambulatory Visit

## 2023-09-06 ENCOUNTER — Encounter: Payer: Self-pay | Admitting: Oncology

## 2023-09-06 DIAGNOSIS — Z9889 Other specified postprocedural states: Secondary | ICD-10-CM | POA: Diagnosis not present

## 2023-09-06 DIAGNOSIS — T85590D Other mechanical complication of bile duct prosthesis, subsequent encounter: Secondary | ICD-10-CM | POA: Diagnosis not present

## 2023-09-06 DIAGNOSIS — J90 Pleural effusion, not elsewhere classified: Secondary | ICD-10-CM | POA: Diagnosis present

## 2023-09-06 DIAGNOSIS — R531 Weakness: Secondary | ICD-10-CM | POA: Diagnosis not present

## 2023-09-06 DIAGNOSIS — D759 Disease of blood and blood-forming organs, unspecified: Secondary | ICD-10-CM | POA: Diagnosis not present

## 2023-09-06 DIAGNOSIS — R1312 Dysphagia, oropharyngeal phase: Secondary | ICD-10-CM | POA: Diagnosis present

## 2023-09-06 DIAGNOSIS — C9010 Plasma cell leukemia not having achieved remission: Secondary | ICD-10-CM | POA: Diagnosis not present

## 2023-09-06 DIAGNOSIS — D696 Thrombocytopenia, unspecified: Secondary | ICD-10-CM | POA: Diagnosis present

## 2023-09-06 DIAGNOSIS — J189 Pneumonia, unspecified organism: Secondary | ICD-10-CM | POA: Diagnosis present

## 2023-09-06 DIAGNOSIS — I10 Essential (primary) hypertension: Secondary | ICD-10-CM | POA: Diagnosis not present

## 2023-09-06 DIAGNOSIS — I4892 Unspecified atrial flutter: Secondary | ICD-10-CM | POA: Diagnosis not present

## 2023-09-06 DIAGNOSIS — R112 Nausea with vomiting, unspecified: Secondary | ICD-10-CM | POA: Diagnosis not present

## 2023-09-06 DIAGNOSIS — I5022 Chronic systolic (congestive) heart failure: Secondary | ICD-10-CM | POA: Diagnosis present

## 2023-09-06 DIAGNOSIS — R64 Cachexia: Secondary | ICD-10-CM | POA: Diagnosis present

## 2023-09-06 DIAGNOSIS — Z6825 Body mass index (BMI) 25.0-25.9, adult: Secondary | ICD-10-CM | POA: Diagnosis not present

## 2023-09-06 DIAGNOSIS — D649 Anemia, unspecified: Secondary | ICD-10-CM | POA: Diagnosis present

## 2023-09-06 DIAGNOSIS — I130 Hypertensive heart and chronic kidney disease with heart failure and stage 1 through stage 4 chronic kidney disease, or unspecified chronic kidney disease: Secondary | ICD-10-CM | POA: Diagnosis present

## 2023-09-06 DIAGNOSIS — E441 Mild protein-calorie malnutrition: Secondary | ICD-10-CM | POA: Diagnosis present

## 2023-09-06 DIAGNOSIS — N179 Acute kidney failure, unspecified: Secondary | ICD-10-CM | POA: Diagnosis present

## 2023-09-06 DIAGNOSIS — I5020 Unspecified systolic (congestive) heart failure: Secondary | ICD-10-CM | POA: Diagnosis not present

## 2023-09-06 DIAGNOSIS — K7689 Other specified diseases of liver: Secondary | ICD-10-CM | POA: Diagnosis present

## 2023-09-06 DIAGNOSIS — Z85820 Personal history of malignant melanoma of skin: Secondary | ICD-10-CM | POA: Diagnosis not present

## 2023-09-06 DIAGNOSIS — Z86718 Personal history of other venous thrombosis and embolism: Secondary | ICD-10-CM | POA: Diagnosis not present

## 2023-09-06 DIAGNOSIS — D7389 Other diseases of spleen: Secondary | ICD-10-CM | POA: Diagnosis not present

## 2023-09-06 DIAGNOSIS — I480 Paroxysmal atrial fibrillation: Secondary | ICD-10-CM | POA: Diagnosis present

## 2023-09-06 DIAGNOSIS — D509 Iron deficiency anemia, unspecified: Secondary | ICD-10-CM | POA: Insufficient documentation

## 2023-09-06 DIAGNOSIS — N183 Chronic kidney disease, stage 3 unspecified: Secondary | ICD-10-CM | POA: Diagnosis present

## 2023-09-06 DIAGNOSIS — D7589 Other specified diseases of blood and blood-forming organs: Secondary | ICD-10-CM | POA: Diagnosis present

## 2023-09-06 DIAGNOSIS — E785 Hyperlipidemia, unspecified: Secondary | ICD-10-CM | POA: Diagnosis present

## 2023-09-06 DIAGNOSIS — D704 Cyclic neutropenia: Secondary | ICD-10-CM | POA: Diagnosis not present

## 2023-09-06 DIAGNOSIS — E872 Acidosis, unspecified: Secondary | ICD-10-CM | POA: Diagnosis present

## 2023-09-06 DIAGNOSIS — I2510 Atherosclerotic heart disease of native coronary artery without angina pectoris: Secondary | ICD-10-CM | POA: Diagnosis present

## 2023-09-06 DIAGNOSIS — R627 Adult failure to thrive: Secondary | ICD-10-CM | POA: Diagnosis present

## 2023-09-06 DIAGNOSIS — D72829 Elevated white blood cell count, unspecified: Secondary | ICD-10-CM | POA: Diagnosis not present

## 2023-09-06 DIAGNOSIS — C9210 Chronic myeloid leukemia, BCR/ABL-positive, not having achieved remission: Secondary | ICD-10-CM | POA: Diagnosis not present

## 2023-09-06 DIAGNOSIS — E7879 Other disorders of bile acid and cholesterol metabolism: Secondary | ICD-10-CM | POA: Diagnosis present

## 2023-09-06 DIAGNOSIS — N400 Enlarged prostate without lower urinary tract symptoms: Secondary | ICD-10-CM | POA: Diagnosis present

## 2023-09-06 DIAGNOSIS — D472 Monoclonal gammopathy: Secondary | ICD-10-CM | POA: Diagnosis not present

## 2023-09-06 DIAGNOSIS — D471 Chronic myeloproliferative disease: Secondary | ICD-10-CM | POA: Diagnosis not present

## 2023-09-06 DIAGNOSIS — K8309 Other cholangitis: Secondary | ICD-10-CM | POA: Diagnosis not present

## 2023-09-06 DIAGNOSIS — K831 Obstruction of bile duct: Secondary | ICD-10-CM | POA: Diagnosis not present

## 2023-09-06 DIAGNOSIS — Z955 Presence of coronary angioplasty implant and graft: Secondary | ICD-10-CM | POA: Diagnosis not present

## 2023-09-06 DIAGNOSIS — R5382 Chronic fatigue, unspecified: Secondary | ICD-10-CM | POA: Diagnosis not present

## 2023-09-06 LAB — KAPPA/LAMBDA LIGHT CHAINS
Kappa free light chain: 38.8 mg/L — ABNORMAL HIGH (ref 3.3–19.4)
Kappa, lambda light chain ratio: 0.86 (ref 0.26–1.65)
Lambda free light chains: 45.1 mg/L — ABNORMAL HIGH (ref 5.7–26.3)

## 2023-09-07 ENCOUNTER — Encounter: Payer: Self-pay | Admitting: Oncology

## 2023-09-07 DIAGNOSIS — D72829 Elevated white blood cell count, unspecified: Secondary | ICD-10-CM | POA: Insufficient documentation

## 2023-09-07 DIAGNOSIS — D472 Monoclonal gammopathy: Secondary | ICD-10-CM | POA: Insufficient documentation

## 2023-09-07 DIAGNOSIS — I959 Hypotension, unspecified: Secondary | ICD-10-CM | POA: Insufficient documentation

## 2023-09-07 LAB — COPPER, SERUM: Copper: 116 ug/dL (ref 69–132)

## 2023-09-07 NOTE — Assessment & Plan Note (Addendum)
 Patient was hypertensive in the clinic today.  Labs unknown at that time.  Patient also had tachycardia.  Likely secondary to probable sepsis and dehydration. Patient had no fever in clinic. -Administered fluids in the infusion center -Unknown labs at this time, hence antibiotics were not administered

## 2023-09-07 NOTE — Assessment & Plan Note (Signed)
 Patient has persistent leukocytosis along with some B symptoms. Labs today show significant elevation of white blood cell count, along with alkaline phosphatase and liver enzymes.  This points more towards biliary cause leading to sepsis.  I informed this results to Dr. Sammi Crick who asked the patient to go to [facility] ER for further evaluation management.  Will not obtain flow cytometry as leukocytosis is likely secondary to sepsis at this time.  Return to clinic after discharge to repeat labs, if leukocytosis is persistent after treating the underlying condition, will do further evaluation

## 2023-09-07 NOTE — Assessment & Plan Note (Signed)
 Patient has a history of MGUS with an M spike of 0.3 in 2021.  - Will repeat labs today and determine further need of PET scan, bone marrow biopsy after the results

## 2023-09-07 NOTE — Progress Notes (Signed)
 [facility] at [facility]  HEMATOLOGY NEW VISIT  Alexander Bickers, Alexander Burton  REASON FOR REFERRAL: Leukocytosis  HISTORY OF PRESENT ILLNESS: Alexander Burton 83 y.o. male referred for leukocytosis. The patient is accompanied by his daughter and wife.  Patient has a history of MGUS and cholecystitis requiring external biliary stent which was recently removed.  He reports 2-week history of worsening fatigue, shortness of breath, cough, chills and night sweats.The patient reports waking up wet from night sweats but has not needed to change the sheets. The patient's appetite has significantly decreased, leading to weight loss and poor nutritional intake. The patient's symptoms have progressively worsened over the past two weeks, leading to a significant decrease in activity level. The patient, who used to be able to walk around the yard, now spends most of the day sitting in a chair due to lack of energy and shortness of breath. The patient also reports occasional abdominal pain.  Patient denies nausea, vomiting.  Patient is a neck smoker, quit 10 years ago, smoked a pack per day.  No alcohol use.  No family history of hematological disorders   I have reviewed the past medical history, past surgical history, social history and family history with the patient   ALLERGIES:  is allergic to contrast media [iodinated contrast media], metrizamide, and other.  MEDICATIONS:  Current Outpatient Medications  Medication Sig Dispense Refill   amiodarone (PACERONE) 200 MG tablet Take 200 mg by mouth daily.     amoxicillin-clavulanate (AUGMENTIN) 875-125 MG tablet Take 1 tablet by mouth 2 (two) times daily. 10 tablet 0   apixaban (ELIQUIS) 5 MG TABS tablet Take 5 mg by mouth 2 (two) times daily.     azithromycin (ZITHROMAX Z-PAK) 250 MG tablet Take azithromycin 500 mg po x 1 on day 1, then on day 2-5, take azithro 250 mg po daily 6 each 0   diphenhydrAMINE (BENADRYL) 50 MG tablet Take 1 tablet  (50 mg total) by mouth at bedtime as needed for itching (Take 1 hour prior to CT scan for contrast allergy). 1 tablet 0   finasteride (PROSCAR) 5 MG tablet Take 5 mg by mouth in the morning.     furosemide (LASIX) 20 MG tablet Take 0.5 tablets (10 mg total) by mouth every other day.     JARDIANCE 10 MG TABS tablet TAKE 1 TABLET(10 MG) BY MOUTH DAILY BEFORE BREAKFAST 30 tablet 5   metoprolol succinate (TOPROL-XL) 25 MG 24 hr tablet Take 1 tablet (25 mg total) by mouth daily. 90 tablet 3   mirtazapine (REMERON) 7.5 MG tablet Take 7.5 mg by mouth at bedtime.     Multiple Vitamin (MULITIVITAMIN WITH MINERALS) TABS Take 1 tablet by mouth in the morning. Centrum Silver men 50+     Multiple Vitamins-Minerals (PRESERVISION AREDS) CAPS Take 1 capsule by mouth in the morning and at bedtime.      nitroGLYCERIN (NITROSTAT) 0.4 MG SL tablet Place 0.4 mg under the tongue every 5 (five) minutes as needed for chest pain.     Oxymetazoline HCl (AFRIN NASAL SPRAY NA) Place 2 sprays into the nose 2 (two) times daily as needed.      pantoprazole (PROTONIX) 40 MG tablet Take 40 mg by mouth at bedtime.      Polyvinyl Alcohol-Povidone PF (REFRESH) 1.4-0.6 % SOLN Place 1-2 drops into both eyes 3 (three) times daily as needed (dry/irritated eyes).     potassium chloride (MICRO-K) 10 MEQ CR capsule Take 10 mEq  by mouth as needed.     pravastatin (PRAVACHOL) 20 MG tablet TAKE 1 TABLET(20 MG) BY MOUTH DAILY 90 tablet 2   predniSONE (DELTASONE) 50 MG tablet Take 1 tablet 13 hours prior to CT Scan, then one additional tablet 7 hours prior, final tablet 1 hour prior to scan 3 tablet 0   Probiotic Product (PROBIOTIC DAILY PO) Take 1 tablet by mouth in the morning.     spironolactone (ALDACTONE) 25 MG tablet Take 1 tablet (25 mg total) by mouth at bedtime. (Patient taking differently: Take 25 mg by mouth daily.) 90 tablet 3   tamsulosin (FLOMAX) 0.4 MG CAPS capsule Take 0.4 mg by mouth in the morning and at bedtime.   3   ursodiol  (ACTIGALL) 250 MG tablet Take 1 tablet (250 mg total) by mouth in the morning and at bedtime. 120 tablet 5   No current facility-administered medications for this visit.     REVIEW OF SYSTEMS:   Constitutional: Denies fevers, chills or night sweats Eyes: Denies blurriness of vision Ears, nose, mouth, throat, and face: Denies mucositis or sore throat Respiratory: Denies cough, dyspnea or wheezes Cardiovascular: Denies palpitation, chest discomfort or lower extremity swelling Gastrointestinal:  Denies nausea, heartburn or change in bowel habits Skin: Denies abnormal skin rashes Lymphatics: Denies new lymphadenopathy or easy bruising Neurological:Denies numbness, tingling or new weaknesses Behavioral/Psych: Mood is stable, no new changes  All other systems were reviewed with the patient and are negative.  PHYSICAL EXAMINATION:   Vitals:   09/05/23 1114  BP: 93/68  Pulse: (!) 103  Resp: 18  Temp: 97.9 F (36.6 C)  SpO2: 99%    GENERAL:alert, no distress and comfortable, appears fatigued LYMPH:  no palpable lymphadenopathy in the cervical, axillary or inguinal LUNGS: clear to auscultation and percussion with normal breathing effort HEART: regular rate & rhythm and no murmurs and no lower extremity edema ABDOMEN:abdomen soft, non-tender and normal bowel sounds Musculoskeletal:no cyanosis of digits and no clubbing  NEURO: alert & oriented x 3 with fluent speech  LABORATORY DATA:  I have reviewed the data as listed  Lab Results  Component Value Date   WBC 26.7 (H) 09/05/2023   NEUTROABS 24.3 (H) 09/05/2023   HGB 11.0 (L) 09/05/2023   HCT 35.6 (L) 09/05/2023   MCV 80.4 09/05/2023   PLT 142 (L) 09/05/2023      Chemistry      Component Value Date/Time   NA 136 09/05/2023 1158   NA 142 06/23/2014 1122   K 4.7 09/05/2023 1158   K 4.5 06/23/2014 1122   CL 103 09/05/2023 1158   CL 109 (H) 11/19/2012 1044   CO2 22 09/05/2023 1158   CO2 25 06/23/2014 1122   BUN 56 (H)  09/05/2023 1158   BUN 15.2 06/23/2014 1122   CREATININE 3.78 (H) 09/05/2023 1158   CREATININE 1.70 (H) 07/29/2023 1129   CREATININE 1.0 06/23/2014 1122      Component Value Date/Time   CALCIUM 8.3 (L) 09/05/2023 1158   CALCIUM 8.4 06/23/2014 1122   ALKPHOS 139 (H) 09/05/2023 1158   ALKPHOS 122 06/23/2014 1122   AST 38 09/05/2023 1158   AST 20 06/23/2014 1122   ALT 66 (H) 09/05/2023 1158   ALT 17 06/23/2014 1122   BILITOT 2.0 (H) 09/05/2023 1158   BILITOT 1.11 06/23/2014 1122      ASSESSMENT & PLAN:  Patient is a 83 y.o. male referred for leukocytosis  Leukocytosis Patient has persistent leukocytosis along with some B symptoms.  Labs today show significant elevation of white blood cell count, along with alkaline phosphatase and liver enzymes.  This points more towards biliary cause leading to sepsis.  I informed this results to Dr. Sammi Crick who asked the patient to go to [facility] ER for further evaluation management.  Will not obtain flow cytometry as leukocytosis is likely secondary to sepsis at this time.  Return to clinic after discharge to repeat labs, if leukocytosis is persistent after treating the underlying condition, will do further evaluation  Anemia Labs from today showed iron deficiency anemia.  -Will schedule patient for iron infusion.  Adverse effects discussed briefly. -Will schedule after patient is discharged from [facility] and repeat labs in 4 weeks to assess response for IV iron  MGUS (monoclonal gammopathy of unknown significance) Patient has a history of MGUS with an M spike of 0.3 in 2021.  - Will repeat labs today and determine further need of PET scan, bone marrow biopsy after the results  Hypotension Patient was hypertensive in the clinic today.  Labs unknown at that time.  Patient also had tachycardia.  Likely secondary to probable sepsis and dehydration. Patient had no fever in clinic. -Administered fluids in the infusion center -Unknown labs at this time,  hence antibiotics were not administered   Orders Placed This Encounter  Procedures   CT Angio Chest Pulmonary Embolism (PE) W or WO Contrast    Please give pre meds. Patient has hives with contrast    If indicated for the ordered procedure, I authorize the administration of contrast media per Radiology protocol:   Yes    Does the patient have a contrast media/X-ray dye allergy?:   Yes    Preferred imaging location?:   [facility]   CBC with Differential/Platelet    Standing Status:   Future    Number of Occurrences:   1    Expected Date:   09/05/2023    Expiration Date:   09/04/2024   Comprehensive metabolic panel with GFR    Standing Status:   Future    Number of Occurrences:   1    Expected Date:   09/05/2023    Expiration Date:   09/04/2024   Uric acid    Standing Status:   Future    Number of Occurrences:   1    Expected Date:   09/05/2023    Expiration Date:   09/04/2024   Lactate dehydrogenase    Standing Status:   Future    Number of Occurrences:   1    Expected Date:   09/05/2023    Expiration Date:   09/04/2024   Kappa/lambda light chains    Standing Status:   Future    Number of Occurrences:   1    Expected Date:   09/05/2023    Expiration Date:   09/04/2024   Multiple Myeloma Panel (SPEP&IFE w/QIG)    Standing Status:   Future    Number of Occurrences:   1    Expected Date:   09/05/2023    Expiration Date:   09/04/2024   Ferritin    Standing Status:   Future    Number of Occurrences:   1    Expected Date:   09/05/2023    Expiration Date:   09/04/2024   Folate    Standing Status:   Future    Number of Occurrences:   1    Expected Date:   09/05/2023    Expiration Date:   09/04/2024  Vitamin B12    Standing Status:   Future    Number of Occurrences:   1    Expected Date:   09/05/2023    Expiration Date:   09/04/2024   Iron and TIBC    Standing Status:   Future    Number of Occurrences:   1    Expected Date:   09/05/2023    Expiration Date:   09/04/2024    Sedimentation rate    Standing Status:   Future    Number of Occurrences:   1    Expected Date:   09/05/2023    Expiration Date:   09/04/2024   C-reactive protein    Standing Status:   Future    Number of Occurrences:   1    Expected Date:   09/05/2023    Expiration Date:   09/04/2024   Copper, serum    Standing Status:   Future    Number of Occurrences:   1    Expected Date:   09/05/2023    Expiration Date:   09/04/2024    The total time spent in the appointment was 40 minutes encounter with patients including review of chart and various tests results, discussions about plan of care and coordination of care plan   All questions were answered. The patient knows to call the clinic with any problems, questions or concerns. No barriers to learning was detected.   Eduardo Grade, Alexander Burton 4/12/202510:32 AM

## 2023-09-07 NOTE — Assessment & Plan Note (Signed)
 Labs from today showed iron deficiency anemia.  -Will schedule patient for iron infusion.  Adverse effects discussed briefly. -Will schedule after patient is discharged from [facility] and repeat labs in 4 weeks to assess response for IV iron

## 2023-09-08 DIAGNOSIS — D72829 Elevated white blood cell count, unspecified: Secondary | ICD-10-CM | POA: Diagnosis not present

## 2023-09-08 DIAGNOSIS — D759 Disease of blood and blood-forming organs, unspecified: Secondary | ICD-10-CM | POA: Diagnosis not present

## 2023-09-09 ENCOUNTER — Ambulatory Visit (INDEPENDENT_AMBULATORY_CARE_PROVIDER_SITE_OTHER): Admitting: Gastroenterology

## 2023-09-09 DIAGNOSIS — C9210 Chronic myeloid leukemia, BCR/ABL-positive, not having achieved remission: Secondary | ICD-10-CM | POA: Diagnosis not present

## 2023-09-09 DIAGNOSIS — D72829 Elevated white blood cell count, unspecified: Secondary | ICD-10-CM | POA: Diagnosis not present

## 2023-09-09 LAB — MULTIPLE MYELOMA PANEL, SERUM
Albumin SerPl Elph-Mcnc: 3 g/dL (ref 2.9–4.4)
Albumin/Glob SerPl: 1.1 (ref 0.7–1.7)
Alpha 1: 0.4 g/dL (ref 0.0–0.4)
Alpha2 Glob SerPl Elph-Mcnc: 0.9 g/dL (ref 0.4–1.0)
B-Globulin SerPl Elph-Mcnc: 1 g/dL (ref 0.7–1.3)
Gamma Glob SerPl Elph-Mcnc: 0.6 g/dL (ref 0.4–1.8)
Globulin, Total: 2.9 g/dL (ref 2.2–3.9)
IgA: 361 mg/dL (ref 61–437)
IgG (Immunoglobin G), Serum: 700 mg/dL (ref 603–1613)
IgM (Immunoglobulin M), Srm: 130 mg/dL (ref 15–143)
Total Protein ELP: 5.9 g/dL — ABNORMAL LOW (ref 6.0–8.5)

## 2023-09-11 NOTE — Transitions of Care (Post Inpatient/ED Visit) (Signed)
   09/11/2023  Name: TYEE VANDEVOORDE MRN: [id number] DOB: 1941/01/07  Today's TOC FU Call Status:  Chart Review for TOC.  Documentation located in [employer]    Jaire Pinkham J. Henri Guedes RN, MSN [facility], [facility] RN Care Manager Direct Dial: [phone]  Fax: [phone] Website: [email or web address]

## 2023-09-12 ENCOUNTER — Inpatient Hospital Stay: Admitting: Dietician

## 2023-09-12 ENCOUNTER — Encounter: Admitting: Dietician

## 2023-09-12 ENCOUNTER — Inpatient Hospital Stay: Admitting: Oncology

## 2023-09-12 ENCOUNTER — Ambulatory Visit: Admitting: Oncology

## 2023-09-12 DIAGNOSIS — H43823 Vitreomacular adhesion, bilateral: Secondary | ICD-10-CM | POA: Diagnosis not present

## 2023-09-12 DIAGNOSIS — H353113 Nonexudative age-related macular degeneration, right eye, advanced atrophic without subfoveal involvement: Secondary | ICD-10-CM | POA: Diagnosis not present

## 2023-09-12 DIAGNOSIS — H353122 Nonexudative age-related macular degeneration, left eye, intermediate dry stage: Secondary | ICD-10-CM | POA: Diagnosis not present

## 2023-09-12 DIAGNOSIS — H40012 Open angle with borderline findings, low risk, left eye: Secondary | ICD-10-CM | POA: Diagnosis not present

## 2023-09-12 DIAGNOSIS — H47092 Other disorders of optic nerve, not elsewhere classified, left eye: Secondary | ICD-10-CM | POA: Diagnosis not present

## 2023-09-13 ENCOUNTER — Encounter: Payer: Self-pay | Admitting: Oncology

## 2023-09-13 DIAGNOSIS — I480 Paroxysmal atrial fibrillation: Secondary | ICD-10-CM | POA: Diagnosis not present

## 2023-09-13 NOTE — Progress Notes (Signed)
 Created in error

## 2023-09-16 NOTE — Addendum Note (Signed)
 Encounter addended by: Stan Eans, RN on: 09/16/2023 2:17 PM  Actions taken: Imaging Exam ended

## 2023-09-20 ENCOUNTER — Inpatient Hospital Stay (HOSPITAL_BASED_OUTPATIENT_CLINIC_OR_DEPARTMENT_OTHER): Admitting: Oncology

## 2023-09-20 ENCOUNTER — Inpatient Hospital Stay

## 2023-09-20 VITALS — BP 99/68 | HR 97 | Temp 97.7°F | Resp 16 | Wt 172.4 lb

## 2023-09-20 DIAGNOSIS — D472 Monoclonal gammopathy: Secondary | ICD-10-CM | POA: Diagnosis not present

## 2023-09-20 DIAGNOSIS — D72829 Elevated white blood cell count, unspecified: Secondary | ICD-10-CM

## 2023-09-20 DIAGNOSIS — E611 Iron deficiency: Secondary | ICD-10-CM | POA: Diagnosis not present

## 2023-09-20 DIAGNOSIS — I959 Hypotension, unspecified: Secondary | ICD-10-CM | POA: Diagnosis not present

## 2023-09-20 DIAGNOSIS — R000 Tachycardia, unspecified: Secondary | ICD-10-CM | POA: Diagnosis not present

## 2023-09-20 DIAGNOSIS — R232 Flushing: Secondary | ICD-10-CM | POA: Diagnosis not present

## 2023-09-20 DIAGNOSIS — D509 Iron deficiency anemia, unspecified: Secondary | ICD-10-CM | POA: Diagnosis not present

## 2023-09-20 DIAGNOSIS — D72825 Bandemia: Secondary | ICD-10-CM

## 2023-09-20 DIAGNOSIS — R5383 Other fatigue: Secondary | ICD-10-CM | POA: Diagnosis not present

## 2023-09-20 LAB — CBC WITH DIFFERENTIAL/PLATELET
Abs Immature Granulocytes: 0 10*3/uL (ref 0.00–0.07)
Basophils Absolute: 0.2 10*3/uL — ABNORMAL HIGH (ref 0.0–0.1)
Basophils Relative: 1 %
Eosinophils Absolute: 0 10*3/uL (ref 0.0–0.5)
Eosinophils Relative: 0 %
HCT: 34.2 % — ABNORMAL LOW (ref 39.0–52.0)
Hemoglobin: 10.2 g/dL — ABNORMAL LOW (ref 13.0–17.0)
Lymphocytes Relative: 16 %
Lymphs Abs: 3.4 10*3/uL (ref 0.7–4.0)
MCH: 24.7 pg — ABNORMAL LOW (ref 26.0–34.0)
MCHC: 29.8 g/dL — ABNORMAL LOW (ref 30.0–36.0)
MCV: 82.8 fL (ref 80.0–100.0)
Monocytes Absolute: 0.6 10*3/uL (ref 0.1–1.0)
Monocytes Relative: 3 %
Neutro Abs: 16.9 10*3/uL — ABNORMAL HIGH (ref 1.7–7.7)
Neutrophils Relative %: 80 %
Platelets: 195 10*3/uL (ref 150–400)
RBC: 4.13 MIL/uL — ABNORMAL LOW (ref 4.22–5.81)
RDW: 19.3 % — ABNORMAL HIGH (ref 11.5–15.5)
WBC: 21.1 10*3/uL — ABNORMAL HIGH (ref 4.0–10.5)
nRBC: 0 % (ref 0.0–0.2)

## 2023-09-20 LAB — COMPREHENSIVE METABOLIC PANEL WITH GFR
ALT: 20 U/L (ref 0–44)
AST: 27 U/L (ref 15–41)
Albumin: 2.8 g/dL — ABNORMAL LOW (ref 3.5–5.0)
Alkaline Phosphatase: 85 U/L (ref 38–126)
Anion gap: 9 (ref 5–15)
BUN: 20 mg/dL (ref 8–23)
CO2: 21 mmol/L — ABNORMAL LOW (ref 22–32)
Calcium: 8.1 mg/dL — ABNORMAL LOW (ref 8.9–10.3)
Chloride: 107 mmol/L (ref 98–111)
Creatinine, Ser: 1.81 mg/dL — ABNORMAL HIGH (ref 0.61–1.24)
GFR, Estimated: 36 mL/min — ABNORMAL LOW (ref >60)
Glucose, Bld: 112 mg/dL — ABNORMAL HIGH (ref 70–99)
Potassium: 4.1 mmol/L (ref 3.5–5.1)
Sodium: 137 mmol/L (ref 135–145)
Total Bilirubin: 0.8 mg/dL (ref 0.0–1.2)
Total Protein: 5.9 g/dL — ABNORMAL LOW (ref 6.5–8.1)

## 2023-09-20 LAB — LACTATE DEHYDROGENASE: LDH: 226 U/L — ABNORMAL HIGH (ref 98–192)

## 2023-09-20 LAB — URIC ACID: Uric Acid, Serum: 5.3 mg/dL (ref 3.7–8.6)

## 2023-09-20 NOTE — Progress Notes (Signed)
 [facility] at [facility]  HEMATOLOGY FOLLOW-UP VISIT  [name], MD  REASON FOR FOLLOW-UP: Leukocytosis  ASSESSMENT & PLAN:  Patient is a 83 y.o. male following for leukocytosis  Leukocytosis Patient has persistent leukocytosis along with some B symptoms.  Patient had a bone marrow biopsy done at [facility] with pending pathology report BCR-ABL.  Quantitative PCR: P210 positive, IS %: 177.85 Flow cytometry:Bone marrow: 94% abnormal granulocytes, 1.1% abnormal monocytes, negative for a discrete population of plasma cells  - Discussed with the patient that bone marrow biopsy results are essential in his case to determine the phase of CML and what treatment options it entices  Return to clinic in 2 weeks to discuss results and further management.  If the results are available sooner we will contact the patient for a sooner appointment  MGUS (monoclonal gammopathy of unknown significance) Patient has a history of MGUS with an M spike of 0.3 in 2021. Recent SPEP with no M spike and slightly elevated free light chains with a normal FLC ratio  - No further workup needed at this time  Iron deficiency The most likely cause of his anemia is due to malabsorption syndrome.  Lab Results  Component Value Date   IRON 27 (L) 09/05/2023   TIBC 368 09/05/2023   FERRITIN 51 09/05/2023  TSAT: 7    -We discussed some of the risks, benefits, and alternatives of intravenous iron infusions. The patient is symptomatic from anemia and the iron level is critically low. He tolerated oral iron supplement poorly and desires to achieve higher levels of iron faster for adequate hematopoesis. Some of the side-effects to be expected including risks of infusion reactions, phlebitis, headaches, nausea and fatigue.  The patient is willing to proceed. Patient education material was dispensed. Goal is to keep ferritin level greater than 50 and resolution of anemia   Return to clinic in 6  weeks with labs to assess response to IV iron    The total time spent in the appointment was 30 minutes encounter with patients including review of chart and various tests results, discussions about plan of care and coordination of care plan   All questions were answered. The patient knows to call the clinic with any problems, questions or concerns. No barriers to learning was detected.  Eduardo Grade, MD 4/25/20252:35 PM    INTERVAL HISTORY: Alexander Burton 83 y.o. male following for leukocytosis. Patient was accompanied by his wife today.  Since the last clinic visit, patient was admitted to hospital for further workup.  He had a bone marrow biopsy done at this time, results of which are pending at this time.  BCR-ABL was positive and flow cytometry showed granulocytes.  Pathology of the bone marrow biopsy is pending at this time.  Patient reports feeling a little better since the discharge and has no new fever with chills.  He still continues to feel fatigued.  We discussed that these results point towards a possible diagnosis of CML but we have to wait on pathology results for a confirmed diagnosis and further treatment plan.  The patient and wife understands this and would follow-up in a few weeks to discuss results  I have reviewed the past medical history, past surgical history, social history and family history with the patient   ALLERGIES:  is allergic to contrast media [iodinated contrast media], metrizamide, and other.  MEDICATIONS:  Current Outpatient Medications  Medication Sig Dispense Refill   amiodarone  (PACERONE ) 200 MG tablet  Take 200 mg by mouth daily.     apixaban  (ELIQUIS ) 5 MG TABS tablet Take 5 mg by mouth 2 (two) times daily.     azithromycin  (ZITHROMAX  Z-PAK) 250 MG tablet Take azithromycin  500 mg po x 1 on day 1, then on day 2-5, take azithro 250 mg po daily 6 each 0   diphenhydrAMINE  (BENADRYL ) 50 MG tablet Take 1 tablet (50 mg total) by mouth at bedtime as  needed for itching (Take 1 hour prior to CT scan for contrast allergy). 1 tablet 0   finasteride  (PROSCAR ) 5 MG tablet Take 5 mg by mouth in the morning.     JARDIANCE  10 MG TABS tablet TAKE 1 TABLET(10 MG) BY MOUTH DAILY BEFORE BREAKFAST 30 tablet 5   metoprolol  succinate (TOPROL -XL) 25 MG 24 hr tablet Take 1 tablet (25 mg total) by mouth daily. 90 tablet 3   mirtazapine (REMERON) 7.5 MG tablet Take 7.5 mg by mouth at bedtime.     Multiple Vitamin (MULITIVITAMIN WITH MINERALS) TABS Take 1 tablet by mouth in the morning. Centrum Silver men 50+     Multiple Vitamins-Minerals (PRESERVISION AREDS) CAPS Take 1 capsule by mouth in the morning and at bedtime.      nitroGLYCERIN (NITROSTAT) 0.4 MG SL tablet Place 0.4 mg under the tongue every 5 (five) minutes as needed for chest pain.     Oxymetazoline HCl (AFRIN NASAL SPRAY NA) Place 2 sprays into the nose 2 (two) times daily as needed.      pantoprazole (PROTONIX) 40 MG tablet Take 40 mg by mouth at bedtime.      Polyvinyl Alcohol-Povidone PF (REFRESH) 1.4-0.6 % SOLN Place 1-2 drops into both eyes 3 (three) times daily as needed (dry/irritated eyes).     potassium chloride  (MICRO-K ) 10 MEQ CR capsule Take 10 mEq by mouth as needed.     pravastatin  (PRAVACHOL ) 20 MG tablet TAKE 1 TABLET(20 MG) BY MOUTH DAILY 90 tablet 2   Probiotic Product (PROBIOTIC DAILY PO) Take 1 tablet by mouth in the morning.     tamsulosin  (FLOMAX ) 0.4 MG CAPS capsule Take 0.4 mg by mouth in the morning and at bedtime.   3   ursodiol  (ACTIGALL ) 250 MG tablet Take 1 tablet (250 mg total) by mouth in the morning and at bedtime. 120 tablet 5   furosemide  (LASIX ) 20 MG tablet Take 0.5 tablets (10 mg total) by mouth every other day. (Patient not taking: Reported on 09/20/2023)     No current facility-administered medications for this visit.     REVIEW OF SYSTEMS:   Constitutional: Denies fevers, chills or night sweats Eyes: Denies blurriness of vision Ears, nose, mouth, throat,  and face: Denies mucositis or sore throat Respiratory: Denies cough, dyspnea or wheezes Cardiovascular: Denies palpitation, chest discomfort or lower extremity swelling Gastrointestinal:  Denies nausea, heartburn or change in bowel habits Skin: Denies abnormal skin rashes Lymphatics: Denies new lymphadenopathy or easy bruising Neurological:Denies numbness, tingling or new weaknesses Behavioral/Psych: Mood is stable, no new changes  All other systems were reviewed with the patient and are negative.  PHYSICAL EXAMINATION:   Vitals:   09/20/23 1127  BP: 99/68  Pulse: 97  Resp: 16  Temp: 97.7 F (36.5 C)  SpO2: 97%    GENERAL:alert, no distress and comfortable SKIN: skin color, texture, turgor are normal, no rashes or significant lesions LUNGS: clear to auscultation and percussion with normal breathing effort HEART: regular rate & rhythm and no murmurs and no lower extremity edema ABDOMEN:abdomen  soft, non-tender and normal bowel sounds Musculoskeletal:no cyanosis of digits and no clubbing  NEURO: alert & oriented x 3 with fluent speech  LABORATORY DATA:  I have reviewed the data as listed and also results reviewed from recent admission at [facility]  Lab Results  Component Value Date   WBC 21.1 (H) 09/20/2023   NEUTROABS 16.9 (H) 09/20/2023   HGB 10.2 (L) 09/20/2023   HCT 34.2 (L) 09/20/2023   MCV 82.8 09/20/2023   PLT 195 09/20/2023       Chemistry      Component Value Date/Time   NA 137 09/20/2023 1030   NA 142 06/23/2014 1122   K 4.1 09/20/2023 1030   K 4.5 06/23/2014 1122   CL 107 09/20/2023 1030   CL 109 (H) 11/19/2012 1044   CO2 21 (L) 09/20/2023 1030   CO2 25 06/23/2014 1122   BUN 20 09/20/2023 1030   BUN 15.2 06/23/2014 1122   CREATININE 1.81 (H) 09/20/2023 1030   CREATININE 1.70 (H) 07/29/2023 1129   CREATININE 1.0 06/23/2014 1122      Component Value Date/Time   CALCIUM 8.1 (L) 09/20/2023 1030   CALCIUM 8.4 06/23/2014 1122   ALKPHOS 85 09/20/2023 1030    ALKPHOS 122 06/23/2014 1122   AST 27 09/20/2023 1030   AST 20 06/23/2014 1122   ALT 20 09/20/2023 1030   ALT 17 06/23/2014 1122   BILITOT 0.8 09/20/2023 1030   BILITOT 1.11 06/23/2014 1122      Latest Reference Range & Units 09/05/23 11:58 09/05/23 11:59  Iron 45 - 182 ug/dL  27 (L)  UIBC ug/dL  782  TIBC 956 - 213 ug/dL  086  Saturation Ratios 17.9 - 39.5 %  7 (L)  Ferritin 24 - 336 ng/mL  51  Folate >5.9 ng/mL  20.8  Copper  69 - 132 ug/dL 578   CRP <4.6 mg/dL  96.2 (H)  Vitamin B12 180 - 914 pg/mL  2,356 (H)  Total Protein ELP 6.0 - 8.5 g/dL  5.9 (L) (C)  Albumin SerPl Elph-Mcnc 2.9 - 4.4 g/dL  3.0 (C)  Albumin/Glob SerPl 0.7 - 1.7   1.1 (C)  Alpha2 Glob SerPl Elph-Mcnc 0.4 - 1.0 g/dL  0.9 (C)  Alpha 1 0.0 - 0.4 g/dL  0.4 (C)  Gamma Glob SerPl Elph-Mcnc 0.4 - 1.8 g/dL  0.6 (C)  M Protein SerPl Elph-Mcnc Not Observed g/dL  Not Observed (C)  IFE 1   Comment (C)  Globulin, Total 2.2 - 3.9 g/dL  2.9 (C)  B-Globulin SerPl Elph-Mcnc 0.7 - 1.3 g/dL  1.0 (C)  IgG (Immunoglobin G), Serum 603 - 1,613 mg/dL  952  IgM (Immunoglobulin M), Srm 15 - 143 mg/dL  841  IgA 61 - 324 mg/dL  401  (L): Data is abnormally low (H): Data is abnormally high (C): Corrected   Latest Reference Range & Units 09/05/23 11:58  Kappa free light chain 3.3 - 19.4 mg/L 38.8 (H)  Lambda free light chains 5.7 - 26.3 mg/L 45.1 (H)  Kappa, lambda light chain ratio 0.26 - 1.65  0.86  (H): Data is abnormally high  BCR-ABL.  Quantitative PCR: P210 positive, IS %: 177.85 Flow cytometry:Bone marrow: 94% abnormal granulocytes, 1.1% abnormal monocytes, negative for a discrete population of plasma cells

## 2023-09-20 NOTE — Assessment & Plan Note (Signed)
 The most likely cause of his anemia is due to malabsorption syndrome.  Lab Results  Component Value Date   IRON 27 (L) 09/05/2023   TIBC 368 09/05/2023   FERRITIN 51 09/05/2023  TSAT: 7    -We discussed some of the risks, benefits, and alternatives of intravenous iron infusions. The patient is symptomatic from anemia and the iron level is critically low. He tolerated oral iron supplement poorly and desires to achieve higher levels of iron faster for adequate hematopoesis. Some of the side-effects to be expected including risks of infusion reactions, phlebitis, headaches, nausea and fatigue.  The patient is willing to proceed. Patient education material was dispensed. Goal is to keep ferritin level greater than 50 and resolution of anemia   Return to clinic in 6 weeks with labs to assess response to IV iron

## 2023-09-20 NOTE — Assessment & Plan Note (Signed)
 Patient has a history of MGUS with an M spike of 0.3 in 2021. Recent SPEP with no M spike and slightly elevated free light chains with a normal FLC ratio  - No further workup needed at this time

## 2023-09-20 NOTE — Assessment & Plan Note (Signed)
 Patient has persistent leukocytosis along with some B symptoms.  Patient had a bone marrow biopsy done at [facility] with pending pathology report BCR-ABL.  Quantitative PCR: P210 positive, IS %: 177.85 Flow cytometry:Bone marrow: 94% abnormal granulocytes, 1.1% abnormal monocytes, negative for a discrete population of plasma cells  - Discussed with the patient that bone marrow biopsy results are essential in his case to determine the phase of CML and what treatment options it entices  Return to clinic in 2 weeks to discuss results and further management.  If the results are available sooner we will contact the patient for a sooner appointment

## 2023-09-23 ENCOUNTER — Other Ambulatory Visit (HOSPITAL_COMMUNITY): Payer: Self-pay | Admitting: Cardiology

## 2023-09-24 ENCOUNTER — Telehealth (HOSPITAL_COMMUNITY): Payer: Self-pay | Admitting: *Deleted

## 2023-09-24 NOTE — Telephone Encounter (Signed)
 Called patient's wife per Dr. Mitzie Anda with following heart monitor results:  "He is back in atrial fibrillation.  Would set up DCCV as long as he has not missed his anticoagulation."  Reminded wife of patient's appointment 09/27/23 at 2:00 pm where patient rhythm can be confirmed and cardioversion scheduled if necessary. Wife confirmed they will come to this appointment.   Called to confirm/remind patient of [facility] appointment [facility] on 09/[facility]/23.    Appointment:              [x] Confirmed             [] Left mess              [] No answer/No voice mail             [] Phone not in service   Patient reminded to bring all medications and/or complete list.   Confirmed patient has transportation. Gave directions, instructed to utilize valet parking.

## 2023-09-26 DIAGNOSIS — I10 Essential (primary) hypertension: Secondary | ICD-10-CM | POA: Diagnosis not present

## 2023-09-26 DIAGNOSIS — I480 Paroxysmal atrial fibrillation: Secondary | ICD-10-CM | POA: Diagnosis not present

## 2023-09-26 DIAGNOSIS — K8309 Other cholangitis: Secondary | ICD-10-CM | POA: Diagnosis not present

## 2023-09-26 DIAGNOSIS — D72829 Elevated white blood cell count, unspecified: Secondary | ICD-10-CM | POA: Diagnosis not present

## 2023-09-26 DIAGNOSIS — I5022 Chronic systolic (congestive) heart failure: Secondary | ICD-10-CM | POA: Diagnosis not present

## 2023-09-26 DIAGNOSIS — N1831 Chronic kidney disease, stage 3a: Secondary | ICD-10-CM | POA: Diagnosis not present

## 2023-09-26 DIAGNOSIS — D649 Anemia, unspecified: Secondary | ICD-10-CM | POA: Diagnosis not present

## 2023-09-26 DIAGNOSIS — I429 Cardiomyopathy, unspecified: Secondary | ICD-10-CM | POA: Diagnosis not present

## 2023-09-26 DIAGNOSIS — I130 Hypertensive heart and chronic kidney disease with heart failure and stage 1 through stage 4 chronic kidney disease, or unspecified chronic kidney disease: Secondary | ICD-10-CM | POA: Diagnosis not present

## 2023-09-26 DIAGNOSIS — R600 Localized edema: Secondary | ICD-10-CM | POA: Diagnosis not present

## 2023-09-26 DIAGNOSIS — I2510 Atherosclerotic heart disease of native coronary artery without angina pectoris: Secondary | ICD-10-CM | POA: Diagnosis not present

## 2023-09-26 DIAGNOSIS — R630 Anorexia: Secondary | ICD-10-CM | POA: Diagnosis not present

## 2023-09-27 ENCOUNTER — Ambulatory Visit (HOSPITAL_COMMUNITY)
Admission: RE | Admit: 2023-09-27 | Discharge: 2023-09-27 | Disposition: A | Discharge status: Home / Self Care | Source: Ambulatory Visit | Attending: Family Medicine | Admitting: Family Medicine

## 2023-09-27 ENCOUNTER — Encounter (HOSPITAL_COMMUNITY): Payer: Self-pay

## 2023-09-27 VITALS — BP 98/72 | HR 98 | Wt 172.0 lb

## 2023-09-27 DIAGNOSIS — Z8719 Personal history of other diseases of the digestive system: Secondary | ICD-10-CM | POA: Insufficient documentation

## 2023-09-27 DIAGNOSIS — Z955 Presence of coronary angioplasty implant and graft: Secondary | ICD-10-CM | POA: Diagnosis not present

## 2023-09-27 DIAGNOSIS — K831 Obstruction of bile duct: Secondary | ICD-10-CM | POA: Diagnosis not present

## 2023-09-27 DIAGNOSIS — D72829 Elevated white blood cell count, unspecified: Secondary | ICD-10-CM

## 2023-09-27 DIAGNOSIS — I2510 Atherosclerotic heart disease of native coronary artery without angina pectoris: Secondary | ICD-10-CM | POA: Diagnosis not present

## 2023-09-27 DIAGNOSIS — R0602 Shortness of breath: Secondary | ICD-10-CM | POA: Diagnosis present

## 2023-09-27 DIAGNOSIS — Z9889 Other specified postprocedural states: Secondary | ICD-10-CM | POA: Diagnosis not present

## 2023-09-27 DIAGNOSIS — I081 Rheumatic disorders of both mitral and tricuspid valves: Secondary | ICD-10-CM | POA: Insufficient documentation

## 2023-09-27 DIAGNOSIS — Z79899 Other long term (current) drug therapy: Secondary | ICD-10-CM | POA: Diagnosis not present

## 2023-09-27 DIAGNOSIS — I5022 Chronic systolic (congestive) heart failure: Secondary | ICD-10-CM | POA: Diagnosis not present

## 2023-09-27 DIAGNOSIS — I4819 Other persistent atrial fibrillation: Secondary | ICD-10-CM | POA: Insufficient documentation

## 2023-09-27 DIAGNOSIS — I480 Paroxysmal atrial fibrillation: Secondary | ICD-10-CM

## 2023-09-27 DIAGNOSIS — I25119 Atherosclerotic heart disease of native coronary artery with unspecified angina pectoris: Secondary | ICD-10-CM

## 2023-09-27 LAB — CBC
HCT: 33.6 % — ABNORMAL LOW (ref 39.0–52.0)
Hemoglobin: 10 g/dL — ABNORMAL LOW (ref 13.0–17.0)
MCH: 24.2 pg — ABNORMAL LOW (ref 26.0–34.0)
MCHC: 29.8 g/dL — ABNORMAL LOW (ref 30.0–36.0)
MCV: 81.2 fL (ref 80.0–100.0)
Platelets: 163 10*3/uL (ref 150–400)
RBC: 4.14 MIL/uL — ABNORMAL LOW (ref 4.22–5.81)
RDW: 18.7 % — ABNORMAL HIGH (ref 11.5–15.5)
WBC: 16.8 10*3/uL — ABNORMAL HIGH (ref 4.0–10.5)
nRBC: 0 % (ref 0.0–0.2)

## 2023-09-27 LAB — BASIC METABOLIC PANEL WITH GFR
Anion gap: 9 (ref 5–15)
BUN: 23 mg/dL (ref 8–23)
CO2: 24 mmol/L (ref 22–32)
Calcium: 8.2 mg/dL — ABNORMAL LOW (ref 8.9–10.3)
Chloride: 107 mmol/L (ref 98–111)
Creatinine, Ser: 1.92 mg/dL — ABNORMAL HIGH (ref 0.61–1.24)
GFR, Estimated: 34 mL/min — ABNORMAL LOW (ref >60)
Glucose, Bld: 99 mg/dL (ref 70–99)
Potassium: 4.2 mmol/L (ref 3.5–5.1)
Sodium: 140 mmol/L (ref 135–145)

## 2023-09-27 NOTE — Patient Instructions (Addendum)
 Thank you for coming in today  If you had labs drawn today, any labs that are abnormal the clinic will call you No news is good news  Medications: No changes  Follow up appointments: Your physician recommends that you return for lab work in: within a week of your cardioversion  Your physician recommends that you schedule a follow-up appointment in:.in  2 weeks in clinic after cardioversion  You are scheduled for a TEE/Cardioversion/TEE Cardioversion on 10/08/2023 with Dr. Mclean.  Please arrive at the [facility] (Main Entrance A) at [facility]: [address] [place], [place] [address] at 11:30 am. (1 hour prior to procedure unless lab work is needed)  DIET: Nothing to eat or drink after midnight except a sip of water  with medications (see medication instructions below)  Medication Instructions: Hold Lasix   Continue your anticoagulant: Eliquis  You will need to continue your anticoagulant after your procedure until you  are told by your  Provider that it is safe to stop   Labs: If patient is on Coumadin, patient needs pt/INR, CBC, BMET within 3 days (No pt/INR needed for patients taking Xarelto, Eliquis , Pradaxa) For patients receiving anesthesia for TEE and all Cardioversion patients: BMET, CBC within 1 week   You must have a responsible person to drive you home and stay in the waiting area during your procedure. Failure to do so could result in cancellation.  Bring your insurance cards.  *Special Note: Every effort is made to have your procedure done on time. Occasionally there are emergencies that occur at the hospital that may cause delays. Please be patient if a delay does occur.     Do the following things EVERYDAY: Weigh yourself in the morning before breakfast. Write it down and keep it in a log. Take your medicines as prescribed Eat low salt foods--Limit salt (sodium) to 2000 mg per day.  Stay as active as you can everyday Limit all fluids for the day to  less than 2 liters   At the [facility], you and your health needs are our priority. As part of our continuing mission to provide you with exceptional heart care, we have created designated Provider Care Teams. These Care Teams include your primary Cardiologist (physician) and Advanced Practice Providers (APPs- Physician Assistants and Nurse Practitioners) who all work together to provide you with the care you need, when you need it.   You may see any of the following providers on your designated Care Team at your next follow up: Dr Jules Oar Dr [name] Dr. Mimi Alt, NP Ruddy Corral, [facility], [place] Dennise Fitz, NP Luster Salters, PharmD   Please be sure to bring in all your medications bottles to every appointment.    Thank you for choosing [facility]  If you have any questions or concerns before your next appointment please send us  a message through [place] or call our office at [phone].    TO LEAVE A MESSAGE FOR THE NURSE SELECT OPTION 2, PLEASE LEAVE A MESSAGE INCLUDING: YOUR NAME DATE OF BIRTH CALL BACK NUMBER REASON FOR CALL**this is important as we prioritize the call backs  YOU WILL RECEIVE A CALL BACK THE SAME DAY AS LONG AS YOU CALL BEFORE 4:00 PM

## 2023-09-27 NOTE — Progress Notes (Signed)
 ReDS Vest / Clip - 09/27/23 1400       ReDS Vest / Clip   Station Marker C    Ruler Value 29.5    ReDS Value Range Low volume    ReDS Actual Value 26

## 2023-09-27 NOTE — Progress Notes (Signed)
 PCP: [name], MD Cardiology: Dr. Londa Rival HF Cardiology: Dr. Mitzie Anda  83 y.o. with history of persistent atrial fibrillation, CAD, and CHF. He had a DES placed in the LAD in 2/10.  He has a hepaticojejunostomy post-complicated cholecystectomy with common bile duct rupture and chronic hepatic abscess. He has had recurrent cholangitis and recurrent biliary strictures.  He reports worsening dyspnea for several months. Echo in 11/23 showed EF 40-45%, mild LV dilation, mild LVH, mildly decreased RV systolic function, mild MR, mild-moderate TR. He was noted to be in atrial fibrillation since 11/23.  He had not been anticoagulated in the past due to low platelets though recently they have been running in the 80s-100s. He has a history of orthostatic hypotension.    Echo in 5/24 showed EF 35%, global hypokinesis, mild LVH, mild RV enlargement and mildly decreased systolic function, severe biatrial enlargement.   Anticoagulation was started and patient had DCCV to NSR in 6/24.    Underwent routine biliary tube exchange at [facility] and had later admitted for SBO in 8/24. Treated with bowel rest and NGT.  Echo 1/25 showed EF improved at 45-50%.  Zio 4/25 showed 100% AF/AFL burden.  Admitted 4/25 at [facility] with CAP. Underwent diagnostic thoracentesis showing exudative effusion with lymphocytic predominance. Hematology consulted, underwent BMBx for further evaluation for hematologic malignancy. Noted to be in AFL during admission.  Discharged home off spiro due to AKI.  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  Today he returns for post hospital HF follow up with his wife. Overall feeling poorly. He is SOB walking short distances on flat ground, has no energy. Has LEE. Denies palpitations, abnormal bleeding, CP, dizziness, or PND/Orthopnea. Sleeps in adjustable bed. Appetite ok. Weight at home 165 pounds. Taking all medications.   ECG (personally reviewed):  AFL  ReDs reading: 26 %, normal  Labs (3/24): LDL 78, plts 106K, K 4, creatinine 1.28 Labs (5/24): K 4.1, creatinine 1.27, plts 84  Labs (6/24): K 4.1, creatinine 1.53 Labs (8/24): K 3.9, creatinine 1.6, plts 65 Labs (9/24): LDL 66, K 5.1, creatinine 4.09, LFTs normal Labs (3/25): K 4.6, creatinine 1.7, hgb 11.2, WBC 19.5 Labs (4/25): K 4.1, creatinine 1.81  PMH: 1. CAD: DES to LAD in 2/10.  - Cardiolite  (5/23): EF 48%, no ischemia/infarction.  2. COPD 3. BPH 4. Hyperlipidemia 5. Melanoma 6. Atrial fibrillation: Persistent - DCCV to NSR in 6/24  7. Chronic thrombocytopenia 8. OSA 9. Chronic dysphagia 10. Biliary system disease: Complicated cholecystectomy with rupture of common bile duct, chronic hepatic abscess after this.   - Hepaticojejunostomy with recurrent cholangitis.  - Recurrent biliary stricture requiring dilations.  11. Chronic HF with mid range EF: Echo (2/18) with EF 55-60%.  Echo (11/23) with EF 40-45%, mild LV dilation, mild LVH, mildly decreased RV systolic function, mild MR, mild-moderate TR.  - Echo (5/24): EF 35%, global hypokinesis, mild LVH, mild RV enlargement and mildly decreased systolic function, severe biatrial enlargement.  - Echo (1/25): EF 45-50%, normal RV  SH: Married, [place] near [place], nonsmoker, no ETOH.   Family History  Problem Relation Age of Onset   Colon cancer Sister    Heart disease Mother    Emphysema Father    ROS: All systems reviewed and negative except as per HPI.  Current Outpatient Medications  Medication Sig Dispense Refill   amiodarone  (PACERONE ) 200 MG tablet Take 200 mg by mouth daily.     apixaban  (ELIQUIS ) 2.5 MG  TABS tablet Take by mouth every 12 (twelve) hours.     diphenhydrAMINE  (BENADRYL ) 50 MG tablet Take 1 tablet (50 mg total) by mouth at bedtime as needed for itching (Take 1 hour prior to CT scan for contrast allergy). 1 tablet 0   finasteride  (PROSCAR ) 5 MG tablet Take 5 mg by mouth in the  morning.     furosemide  (LASIX ) 20 MG tablet Take 0.5 tablets (10 mg total) by mouth every other day. (Patient taking differently: Take 20 mg by mouth. Every 3rd day)     JARDIANCE  10 MG TABS tablet TAKE 1 TABLET(10 MG) BY MOUTH DAILY BEFORE BREAKFAST 30 tablet 5   metoprolol  succinate (TOPROL -XL) 25 MG 24 hr tablet Take 1 tablet (25 mg total) by mouth daily. 90 tablet 3   mirtazapine (REMERON) 7.5 MG tablet Take 7.5 mg by mouth at bedtime.     Multiple Vitamin (MULITIVITAMIN WITH MINERALS) TABS Take 1 tablet by mouth in the morning. Centrum Silver men 50+     Multiple Vitamins-Minerals (PRESERVISION AREDS) CAPS Take 1 capsule by mouth in the morning and at bedtime.      nitroGLYCERIN (NITROSTAT) 0.4 MG SL tablet Place 0.4 mg under the tongue every 5 (five) minutes as needed for chest pain.     Oxymetazoline HCl (AFRIN NASAL SPRAY NA) Place 2 sprays into the nose 2 (two) times daily as needed.      pantoprazole (PROTONIX) 40 MG tablet Take 40 mg by mouth at bedtime.      Polyvinyl Alcohol-Povidone PF (REFRESH) 1.4-0.6 % SOLN Place 1-2 drops into both eyes 3 (three) times daily as needed (dry/irritated eyes).     potassium chloride  (MICRO-K ) 10 MEQ CR capsule Take 10 mEq by mouth. Every 3rd day     pravastatin  (PRAVACHOL ) 20 MG tablet TAKE 1 TABLET(20 MG) BY MOUTH DAILY 90 tablet 2   Probiotic Product (PROBIOTIC DAILY PO) Take 1 tablet by mouth in the morning.     tamsulosin  (FLOMAX ) 0.4 MG CAPS capsule Take 0.4 mg by mouth in the morning and at bedtime.   3   ursodiol  (ACTIGALL ) 250 MG tablet Take 1 tablet (250 mg total) by mouth in the morning and at bedtime. 120 tablet 5   No current facility-administered medications for this encounter.   Wt Readings from Last 3 Encounters:  09/27/23 78 kg (172 lb)  09/20/23 78.2 kg (172 lb 6.4 oz)  08/12/23 79.3 kg (174 lb 12.8 oz)   BP 98/72   Pulse 98   Wt 78 kg (172 lb)   SpO2 99%   BMI 24.33 kg/m  Physical Exam General:  NAD. No resp difficulty,  elderly, thin HEENT: Normal Neck: Supple. No JVD. Cor: irregular rate & rhythm. No rubs, gallops or murmurs. Lungs: Clear, diminished in bases Abdomen: Soft, nontender, nondistended.  Extremities: No cyanosis, clubbing, rash, edema Neuro: Alert & oriented x 3, moves all 4 extremities w/o difficulty. Affect pleasant.  Assessment/Plan: 1. Chronic HF with mid-range EF: Echo (11/23) with EF 40-45%, mild LV dilation, mild LVH, mildly decreased RV systolic function, mild MR, mild-moderate TR.  Cardiolite  in 5/23 with no ischemia or infarction.  Echo in 5/24 with EF 35%, global hypokinesis, mild LVH, mild RV enlargement and mildly decreased systolic function, severe biatrial enlargement.  The onset of atrial fibrillation may have triggered CHF worsening, now s/p DCCV to NSR in 6/24.  He has had no chest pain.  Echo (1/25): EF 45-50%, normal RV. Worse NYHA class III-early IV symptoms.  He does not appear volume overloaded, ReDs 26%. Suspect worsening symptoms 2/2 to return of AFL. - Continue Jardiance  10 mg daily. No GU symptoms. BMET today. - Continue Lasix  10 mg every other day (patient says that he responds very aggressively to Lasix  20 mg daily).  - Continue Toprol  XL 25 mg daily.  - Off spiro with recent AKI. - history of orthostatic hypotension, but has not needed midodrine  recently. May need to revisit if BP not improved after restoration of NSR. 2. Atrial fibrillation: Persistent.  He had been in atrial fibrillation since 11/23.  This seemed to coincide with worsening of CHF symptoms. He had not been anticoagulated due to low platelets. However, looking back, his platelets seem to range 80K-100K which is not markedly low.  Anticoagulation has been started and he is doing well with it so far.  S/p DCCV in 6/24. Had been in NSR until ~ 08/2023. Zio 4/25 showed 100% AF/AFL burden. AFL on ECG today. - Continue Toprol  XL 25 mg daily.  - Continue apixaban  2.5 mg bid (age + SCr). No bleeding issues. -  Continue amiodarone  200 mg daily. - We discussed DCCV today to restore NSR. He and wife agreeable, will arrange with Dr. Mitzie Anda Informed Consent   Shared Decision Making/Informed Consent The risks (stroke, cardiac arrhythmias rarely resulting in the need for a temporary or permanent pacemaker, skin irritation or burns and complications associated with conscious sedation including aspiration, arrhythmia, respiratory failure and death), benefits (restoration of normal sinus rhythm) and alternatives of a direct current cardioversion were explained in detail to Mr. Nellessen and he agrees to proceed.       3. Recurrent biliary stricture with hepaticojejunostomy: Biliary stent has been removed.  4. CAD: DES to LAD in 2/10.  No chest pain.  Cardiolite  in 5/23 showed no ischemia. EF was lower on last echo but this may be related to AF, will hold off on cath for now with no chest pain. Echo 1/25 showed EF improved to 45-50% with restoration of NSR. - Continue pravastatin , lipids acceptable in 9/24.  5. Leukocytosis: Following with Heme/Onc. Awaiting results of BMBx to determine next steps.  Follow up with APP 2 weeks after DCCV.  Arlice Bene [facility] FNP-BC 09/27/2023

## 2023-09-27 NOTE — H&P (View-Only) (Signed)
 PCP: [name], MD Cardiology: Dr. Londa Rival HF Cardiology: Dr. Mitzie Anda  83 y.o. with history of persistent atrial fibrillation, CAD, and CHF. He had a DES placed in the LAD in 2/10.  He has a hepaticojejunostomy post-complicated cholecystectomy with common bile duct rupture and chronic hepatic abscess. He has had recurrent cholangitis and recurrent biliary strictures.  He reports worsening dyspnea for several months. Echo in 11/23 showed EF 40-45%, mild LV dilation, mild LVH, mildly decreased RV systolic function, mild MR, mild-moderate TR. He was noted to be in atrial fibrillation since 11/23.  He had not been anticoagulated in the past due to low platelets though recently they have been running in the 80s-100s. He has a history of orthostatic hypotension.    Echo in 5/24 showed EF 35%, global hypokinesis, mild LVH, mild RV enlargement and mildly decreased systolic function, severe biatrial enlargement.   Anticoagulation was started and patient had DCCV to NSR in 6/24.    Underwent routine biliary tube exchange at [facility] and had later admitted for SBO in 8/24. Treated with bowel rest and NGT.  Echo 1/25 showed EF improved at 45-50%.  Zio 4/25 showed 100% AF/AFL burden.  Admitted 4/25 at [facility] with CAP. Underwent diagnostic thoracentesis showing exudative effusion with lymphocytic predominance. Hematology consulted, underwent BMBx for further evaluation for hematologic malignancy. Noted to be in AFL during admission.  Discharged home off spiro due to AKI.  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  Today he returns for post hospital HF follow up with his wife. Overall feeling poorly. He is SOB walking short distances on flat ground, has no energy. Has LEE. Denies palpitations, abnormal bleeding, CP, dizziness, or PND/Orthopnea. Sleeps in adjustable bed. Appetite ok. Weight at home 165 pounds. Taking all medications.   ECG (personally reviewed):  AFL  ReDs reading: 26 %, normal  Labs (3/24): LDL 78, plts 106K, K 4, creatinine 1.28 Labs (5/24): K 4.1, creatinine 1.27, plts 84  Labs (6/24): K 4.1, creatinine 1.53 Labs (8/24): K 3.9, creatinine 1.6, plts 65 Labs (9/24): LDL 66, K 5.1, creatinine 4.09, LFTs normal Labs (3/25): K 4.6, creatinine 1.7, hgb 11.2, WBC 19.5 Labs (4/25): K 4.1, creatinine 1.81  PMH: 1. CAD: DES to LAD in 2/10.  - Cardiolite  (5/23): EF 48%, no ischemia/infarction.  2. COPD 3. BPH 4. Hyperlipidemia 5. Melanoma 6. Atrial fibrillation: Persistent - DCCV to NSR in 6/24  7. Chronic thrombocytopenia 8. OSA 9. Chronic dysphagia 10. Biliary system disease: Complicated cholecystectomy with rupture of common bile duct, chronic hepatic abscess after this.   - Hepaticojejunostomy with recurrent cholangitis.  - Recurrent biliary stricture requiring dilations.  11. Chronic HF with mid range EF: Echo (2/18) with EF 55-60%.  Echo (11/23) with EF 40-45%, mild LV dilation, mild LVH, mildly decreased RV systolic function, mild MR, mild-moderate TR.  - Echo (5/24): EF 35%, global hypokinesis, mild LVH, mild RV enlargement and mildly decreased systolic function, severe biatrial enlargement.  - Echo (1/25): EF 45-50%, normal RV  SH: Married, lives on farm near [place], nonsmoker, no ETOH.   Family History  Problem Relation Age of Onset   Colon cancer Sister    Heart disease Mother    Emphysema Father    ROS: All systems reviewed and negative except as per HPI.  Current Outpatient Medications  Medication Sig Dispense Refill   amiodarone  (PACERONE ) 200 MG tablet Take 200 mg by mouth daily.     apixaban  (ELIQUIS ) 2.5 MG  TABS tablet Take by mouth every 12 (twelve) hours.     diphenhydrAMINE  (BENADRYL ) 50 MG tablet Take 1 tablet (50 mg total) by mouth at bedtime as needed for itching (Take 1 hour prior to CT scan for contrast allergy). 1 tablet 0   finasteride  (PROSCAR ) 5 MG tablet Take 5 mg by mouth in the  morning.     furosemide  (LASIX ) 20 MG tablet Take 0.5 tablets (10 mg total) by mouth every other day. (Patient taking differently: Take 20 mg by mouth. Every 3rd day)     JARDIANCE  10 MG TABS tablet TAKE 1 TABLET(10 MG) BY MOUTH DAILY BEFORE BREAKFAST 30 tablet 5   metoprolol  succinate (TOPROL -XL) 25 MG 24 hr tablet Take 1 tablet (25 mg total) by mouth daily. 90 tablet 3   mirtazapine (REMERON) 7.5 MG tablet Take 7.5 mg by mouth at bedtime.     Multiple Vitamin (MULITIVITAMIN WITH MINERALS) TABS Take 1 tablet by mouth in the morning. Centrum Silver men 50+     Multiple Vitamins-Minerals (PRESERVISION AREDS) CAPS Take 1 capsule by mouth in the morning and at bedtime.      nitroGLYCERIN (NITROSTAT) 0.4 MG SL tablet Place 0.4 mg under the tongue every 5 (five) minutes as needed for chest pain.     Oxymetazoline HCl (AFRIN NASAL SPRAY NA) Place 2 sprays into the nose 2 (two) times daily as needed.      pantoprazole (PROTONIX) 40 MG tablet Take 40 mg by mouth at bedtime.      Polyvinyl Alcohol-Povidone PF (REFRESH) 1.4-0.6 % SOLN Place 1-2 drops into both eyes 3 (three) times daily as needed (dry/irritated eyes).     potassium chloride  (MICRO-K ) 10 MEQ CR capsule Take 10 mEq by mouth. Every 3rd day     pravastatin  (PRAVACHOL ) 20 MG tablet TAKE 1 TABLET(20 MG) BY MOUTH DAILY 90 tablet 2   Probiotic Product (PROBIOTIC DAILY PO) Take 1 tablet by mouth in the morning.     tamsulosin  (FLOMAX ) 0.4 MG CAPS capsule Take 0.4 mg by mouth in the morning and at bedtime.   3   ursodiol  (ACTIGALL ) 250 MG tablet Take 1 tablet (250 mg total) by mouth in the morning and at bedtime. 120 tablet 5   No current facility-administered medications for this encounter.   Wt Readings from Last 3 Encounters:  09/27/23 78 kg (172 lb)  09/20/23 78.2 kg (172 lb 6.4 oz)  08/12/23 79.3 kg (174 lb 12.8 oz)   BP 98/72   Pulse 98   Wt 78 kg (172 lb)   SpO2 99%   BMI 24.33 kg/m  Physical Exam General:  NAD. No resp difficulty,  elderly, thin HEENT: Normal Neck: Supple. No JVD. Cor: irregular rate & rhythm. No rubs, gallops or murmurs. Lungs: Clear, diminished in bases Abdomen: Soft, nontender, nondistended.  Extremities: No cyanosis, clubbing, rash, edema Neuro: Alert & oriented x 3, moves all 4 extremities w/o difficulty. Affect pleasant.  Assessment/Plan: 1. Chronic HF with mid-range EF: Echo (11/23) with EF 40-45%, mild LV dilation, mild LVH, mildly decreased RV systolic function, mild MR, mild-moderate TR.  Cardiolite  in 5/23 with no ischemia or infarction.  Echo in 5/24 with EF 35%, global hypokinesis, mild LVH, mild RV enlargement and mildly decreased systolic function, severe biatrial enlargement.  The onset of atrial fibrillation may have triggered CHF worsening, now s/p DCCV to NSR in 6/24.  He has had no chest pain.  Echo (1/25): EF 45-50%, normal RV. Worse NYHA class III-early IV symptoms.  He does not appear volume overloaded, ReDs 26%. Suspect worsening symptoms 2/2 to return of AFL. - Continue Jardiance  10 mg daily. No GU symptoms. BMET today. - Continue Lasix  10 mg every other day (patient says that he responds very aggressively to Lasix  20 mg daily).  - Continue Toprol  XL 25 mg daily.  - Off spiro with recent AKI. - history of orthostatic hypotension, but has not needed midodrine  recently. May need to revisit if BP not improved after restoration of NSR. 2. Atrial fibrillation: Persistent.  He had been in atrial fibrillation since 11/23.  This seemed to coincide with worsening of CHF symptoms. He had not been anticoagulated due to low platelets. However, looking back, his platelets seem to range 80K-100K which is not markedly low.  Anticoagulation has been started and he is doing well with it so far.  S/p DCCV in 6/24. Had been in NSR until ~ 08/2023. Zio 4/25 showed 100% AF/AFL burden. AFL on ECG today. - Continue Toprol  XL 25 mg daily.  - Continue apixaban  2.5 mg bid (age + SCr). No bleeding issues. -  Continue amiodarone  200 mg daily. - We discussed DCCV today to restore NSR. He and wife agreeable, will arrange with Dr. Mitzie Anda Informed Consent   Shared Decision Making/Informed Consent The risks (stroke, cardiac arrhythmias rarely resulting in the need for a temporary or permanent pacemaker, skin irritation or burns and complications associated with conscious sedation including aspiration, arrhythmia, respiratory failure and death), benefits (restoration of normal sinus rhythm) and alternatives of a direct current cardioversion were explained in detail to Mr. Nellessen and he agrees to proceed.       3. Recurrent biliary stricture with hepaticojejunostomy: Biliary stent has been removed.  4. CAD: DES to LAD in 2/10.  No chest pain.  Cardiolite  in 5/23 showed no ischemia. EF was lower on last echo but this may be related to AF, will hold off on cath for now with no chest pain. Echo 1/25 showed EF improved to 45-50% with restoration of NSR. - Continue pravastatin , lipids acceptable in 9/24.  5. Leukocytosis: Following with Heme/Onc. Awaiting results of BMBx to determine next steps.  Follow up with APP 2 weeks after DCCV.  Arlice Bene [facility] FNP-BC 09/27/2023

## 2023-09-29 ENCOUNTER — Other Ambulatory Visit (HOSPITAL_COMMUNITY): Payer: Self-pay | Admitting: Cardiology

## 2023-10-03 ENCOUNTER — Telehealth: Payer: Self-pay | Admitting: Pharmacist

## 2023-10-03 ENCOUNTER — Other Ambulatory Visit (HOSPITAL_COMMUNITY): Payer: Self-pay

## 2023-10-03 ENCOUNTER — Inpatient Hospital Stay

## 2023-10-03 ENCOUNTER — Telehealth: Payer: Self-pay | Admitting: Pharmacy Technician

## 2023-10-03 ENCOUNTER — Inpatient Hospital Stay: Attending: Oncology | Admitting: Oncology

## 2023-10-03 VITALS — BP 102/69 | HR 98 | Temp 97.4°F | Resp 98 | Ht 70.0 in | Wt 175.0 lb

## 2023-10-03 DIAGNOSIS — Z5111 Encounter for antineoplastic chemotherapy: Secondary | ICD-10-CM | POA: Insufficient documentation

## 2023-10-03 DIAGNOSIS — Z79899 Other long term (current) drug therapy: Secondary | ICD-10-CM | POA: Diagnosis not present

## 2023-10-03 DIAGNOSIS — D509 Iron deficiency anemia, unspecified: Secondary | ICD-10-CM | POA: Insufficient documentation

## 2023-10-03 DIAGNOSIS — C9210 Chronic myeloid leukemia, BCR/ABL-positive, not having achieved remission: Secondary | ICD-10-CM

## 2023-10-03 DIAGNOSIS — R0602 Shortness of breath: Secondary | ICD-10-CM | POA: Diagnosis not present

## 2023-10-03 DIAGNOSIS — D649 Anemia, unspecified: Secondary | ICD-10-CM | POA: Diagnosis not present

## 2023-10-03 LAB — CBC WITH DIFFERENTIAL/PLATELET
Abs Immature Granulocytes: 1 10*3/uL — ABNORMAL HIGH (ref 0.00–0.07)
Basophils Absolute: 0.7 10*3/uL — ABNORMAL HIGH (ref 0.0–0.1)
Basophils Relative: 4 %
Eosinophils Absolute: 0.2 10*3/uL (ref 0.0–0.5)
Eosinophils Relative: 1 %
HCT: 35.5 % — ABNORMAL LOW (ref 39.0–52.0)
Hemoglobin: 10.4 g/dL — ABNORMAL LOW (ref 13.0–17.0)
Lymphocytes Relative: 4 %
Lymphs Abs: 0.7 10*3/uL (ref 0.7–4.0)
MCH: 24.1 pg — ABNORMAL LOW (ref 26.0–34.0)
MCHC: 29.3 g/dL — ABNORMAL LOW (ref 30.0–36.0)
MCV: 82.2 fL (ref 80.0–100.0)
Metamyelocytes Relative: 3 %
Monocytes Absolute: 0.9 10*3/uL (ref 0.1–1.0)
Monocytes Relative: 5 %
Myelocytes: 3 %
Neutro Abs: 13.6 10*3/uL — ABNORMAL HIGH (ref 1.7–7.7)
Neutrophils Relative %: 80 %
Platelets: 161 10*3/uL (ref 150–400)
RBC: 4.32 MIL/uL (ref 4.22–5.81)
RDW: 18.6 % — ABNORMAL HIGH (ref 11.5–15.5)
Smear Review: ADEQUATE
WBC: 17 10*3/uL — ABNORMAL HIGH (ref 4.0–10.5)
nRBC: 0.1 % (ref 0.0–0.2)

## 2023-10-03 LAB — COMPREHENSIVE METABOLIC PANEL WITH GFR
ALT: 17 U/L (ref 0–44)
AST: 25 U/L (ref 15–41)
Albumin: 3 g/dL — ABNORMAL LOW (ref 3.5–5.0)
Alkaline Phosphatase: 75 U/L (ref 38–126)
Anion gap: 8 (ref 5–15)
BUN: 24 mg/dL — ABNORMAL HIGH (ref 8–23)
CO2: 26 mmol/L (ref 22–32)
Calcium: 8.2 mg/dL — ABNORMAL LOW (ref 8.9–10.3)
Chloride: 105 mmol/L (ref 98–111)
Creatinine, Ser: 1.67 mg/dL — ABNORMAL HIGH (ref 0.61–1.24)
GFR, Estimated: 40 mL/min — ABNORMAL LOW (ref >60)
Glucose, Bld: 67 mg/dL — ABNORMAL LOW (ref 70–99)
Potassium: 3.9 mmol/L (ref 3.5–5.1)
Sodium: 139 mmol/L (ref 135–145)
Total Bilirubin: 1 mg/dL (ref 0.0–1.2)
Total Protein: 5.7 g/dL — ABNORMAL LOW (ref 6.5–8.1)

## 2023-10-03 LAB — URIC ACID: Uric Acid, Serum: 5.1 mg/dL (ref 3.7–8.6)

## 2023-10-03 LAB — LACTATE DEHYDROGENASE: LDH: 211 U/L — ABNORMAL HIGH (ref 98–192)

## 2023-10-03 MED ORDER — ASCIMINIB HCL 40 MG PO TABS
40.0000 mg | ORAL_TABLET | Freq: Two times a day (BID) | ORAL | 2 refills | Status: DC
  Filled 2023-10-04: qty 60, 30d supply, fill #0
  Filled 2023-10-29: qty 60, 30d supply, fill #1
  Filled 2023-11-25 – 2023-12-03 (×2): qty 60, 30d supply, fill #2

## 2023-10-03 NOTE — Patient Instructions (Signed)
 VISIT SUMMARY:  You came in today for a follow-up and to discuss treatment options for your chronic myeloid leukemia (CML). We also reviewed your history of follicular lymphoma and your current symptoms, including fatigue and shortness of breath.  YOUR PLAN:  -CHRONIC MYELOID LEUKEMIA (CML): Chronic myeloid leukemia is a type of cancer that affects the blood and bone marrow. We are starting you on a medication called a tyrosine kinase inhibitor (TKI) to help reduce the levels of cancer cells. You will take this medication twice daily. We will monitor you for any side effects, such as swelling, low blood cell counts, or pancreas issues. Please coordinate with the oral chemotherapy pharmacist for medication delivery and education. We will also perform baseline labs before you start the treatment and schedule monthly follow-ups to monitor your progress.  INSTRUCTIONS:  Please schedule your baseline labs before starting the TKI therapy. We will also need to see you for monthly follow-ups to monitor your progress and any potential side effects. Coordinate with the oral chemotherapy pharmacist for medication delivery and education.   What is chronic myeloid leukemia?  Chronic myeloid leukemia (called "CML") is a type of blood cancer. Blood is made up of different types of cells. Blood cells are made in the center of your bones, in a part called the bone marrow.  People with CML have an abnormal gene in some of their blood cells. This gene causes the bone marrow to make abnormal blood cells. These abnormal blood cells can grow out of control, get into the blood, and travel around the body. Sometimes, these cells collect in certain parts of the body.  When the bone marrow makes abnormal blood cells, it does not make enough of the normal blood cells a person's body needs. This can cause symptoms.  There are 3 stages, or "phases," of CML:  ?During the chronic phase, the cancer grows very slowly. Most  people are in this phase when their CML is diagnosed. This phase can last for years.  ?During the accelerated phase, the cancer begins to grow faster.  ?During the blast phase, the cancer is fast-growing and can be fatal.  Treatment depends, in part, on the phase of your CML.  What are the symptoms of CML?  CML does not always cause symptoms, especially at first. When it does cause symptoms, the most common ones are:  ?Feeling very tired and weak  ?Sweating much more than usual  ?Losing weight without trying  ?Feeling full after eating a small amount of food  ?Bleeding more easily than normal  Is there a test for CML?  Yes. Your doctor or nurse will talk with you, do an exam, and do blood tests. They might also do a bone marrow biopsy. For this test, a doctor takes a small sample of your bone marrow. The sample is then examined under a microscope to see if abnormal (cancer) cells are present.  How is CML treated?  Doctors can treat CML in different ways. When CML is first diagnosed and in the chronic phase, it is treated with pills called "tyrosine kinase inhibitors" (or "TKIs"). TKIs kill the blood cells that have the abnormal CML gene. When these blood cells are gone, the bone marrow can work normally again. TKIs do not cure CML, but they can control it for many years. If you take a TKI, it is very important to take every single dose. Skipping pills can allow the CML to grow out of control. Most people with CML  must take a TKI for the rest of their life.  People who have too many side effects from their TKI, or whose CML starts to grow faster, can have other treatments. These can include:  ?Another TKI - Different TKIs have different side effects. Also, one TKI might work better to control your CML than another. If one TKI doesn't work for you, another might.  ?Chemotherapy - Chemotherapy is the medical term for medicines that kill cancer cells or stop them from growing.  ?Bone  marrow transplant (also called "stem cell transplant") - This treatment replaces cells in the bone marrow that are killed by chemotherapy or radiation. These "donor" cells can come from another person whose cells match yours in specific ways.  ?A type of medicine called "interferon alfa"  Treatment also involves regular follow-up visits, exams, and blood tests. Some people, but not everyone, might need additional bone marrow biopsies.  What else should I do?  It's important to follow all of your doctor's instructions about visits, tests, and treatment. It's also important to talk to your doctor about any side effects or problems you have during treatment.  Getting treated for CML involves making many choices, such as what treatment to have and when.  Always let your doctors and nurses know how you feel about a treatment. Any time you are offered a treatment, ask:  ?What are the benefits of this treatment? Is it likely to help me live longer? Will it reduce or prevent symptoms?  ?What are the downsides to this treatment?  ?Are there other options besides this treatment?  ?What happens if I do not have this treatment?

## 2023-10-03 NOTE — Assessment & Plan Note (Signed)
 Previous labs showed iron deficiency anemia.  -Will schedule patient for iron infusion.  Adverse effects discussed briefly.  Will repeat labs in a month to assess response

## 2023-10-03 NOTE — Telephone Encounter (Signed)
 Clinical Pharmacist Practitioner Encounter   Received new prescription for Scemblix (asciminib) for the treatment of CML , planned duration until disease control or unacceptable drug toxicity.  BP from 10/03/23 well controlled. Prescription dose and frequency assessed.   Current medication list in Epic reviewed, one DDIs with asciminib identified: Pravastatin : Asciminib may increase serum concentration of pravastatin . Monitor for increased effects and toxicities of pravastatin . No baseline dose adjustment needed.  Evaluated chart and no patient barriers to medication adherence identified.   Prescription has been e-scribed to the [facility] for benefits analysis and approval.  Oral Oncology Clinic will continue to follow for insurance authorization, copayment issues, initial counseling and start date.   Evette Diclemente N. Johnisha Louks, PharmD, BCOP, CPP Hematology/Oncology Clinical Pharmacist [facility] Oral Chemotherapy Navigation Clinic [phone]  10/03/2023 12:04 PM

## 2023-10-03 NOTE — Telephone Encounter (Signed)
 Oral Oncology Patient Advocate Encounter  Prior Authorization for Scemblix has been approved.    PA# [id number] Effective dates: 10/03/2023 through 10/02/2024  Patients co-pay is $0.    Thresa Floor, CPhT Oncology Pharmacy Patient Advocate [facility] Direct Number: [phone] Fax: [phone]

## 2023-10-03 NOTE — Assessment & Plan Note (Addendum)
 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  High risk based on Sokol score BCR-ABL.  Quantitative PCR: P210 positive, IS %: 177.85 Flow cytometry:Bone marrow: 94% abnormal granulocytes, 1.1% abnormal monocytes, negative for a discrete population of plasma cells Ultrasound abdomen showed a spleen size of 14 cm ABL kinase mutation testing: Negative  - Discussed that patient has high risk CML and would require treatment with a second-generation TKI.  Considering patient's comorbidities and age would consider doing asciminib as a first-line option. - Discussed common side effects of asciminib including upper respiratory tract infections, musculoskeletal pain, fatigue, nausea, rash and diarrhea.  Also discussed that it can cause an increased incidence of pancreatitis with elevated lipase and amylase. -Will coordinate with oral chemo pharmacist for medication delivery and education - Will obtain labs today for a new baseline BCR-ABL to assess for response after starting TKI - Medication does not need to be held for his ablation procedure that is planned for next Tuesday  Return to clinic in 1 month with labs

## 2023-10-03 NOTE — Progress Notes (Signed)
 [facility]  HEMATOLOGY FOLLOW-UP VISIT  Alexander Bickers, MD  REASON FOR FOLLOW-UP: CML  ASSESSMENT & PLAN:  Patient is a 83 y.o. 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  High risk based on Sokol score BCR-ABL.  Quantitative PCR: P210 positive, IS %: 177.85 Flow cytometry:Bone marrow: 94% abnormal granulocytes, 1.1% abnormal monocytes, negative for a discrete population of plasma cells Ultrasound abdomen showed a spleen size of 14 cm ABL kinase mutation testing: Negative  - Discussed that patient has high risk CML and would require treatment with a second-generation TKI.  Considering patient's comorbidities and age would consider doing asciminib as a first-line option. - Discussed common side effects of asciminib including upper respiratory tract infections, musculoskeletal pain, fatigue, nausea, rash and diarrhea.  Also discussed that it can cause an increased incidence of pancreatitis with elevated lipase and amylase. -Will coordinate with oral chemo pharmacist for medication delivery and education - Will obtain labs today for a new baseline BCR-ABL to assess for response after starting TKI - Medication does not need to be held for his ablation procedure that is planned for next Tuesday  Return to clinic in 1 month with labs  Anemia Previous labs showed iron deficiency anemia.  -Will schedule patient for iron infusion.  Adverse effects discussed briefly.  Will repeat labs in a month to assess response   The total time spent in the appointment was 40 minutes encounter with patients including review of chart and various tests results, discussions about plan of care and coordination of care plan   All questions were answered. The patient knows to call the clinic with any problems, questions or concerns.  No barriers to learning was detected.  Eduardo Grade, MD 5/8/20253:43 PM   Orders Placed This Encounter  Procedures   CBC with Differential/Platelet   Comprehensive metabolic panel with GFR   Lactate dehydrogenase   Uric acid   BCR-ABL1, CML/ALL, PCR, QUANT   CBC with Differential/Platelet   Comprehensive metabolic panel with GFR   Ferritin   Vitamin B12   Folate   Iron and TIBC   Lactate dehydrogenase   Uric acid    HEMATOLOGY HISTORY: 09/05/2023: Ultrasound abdomen: Spleen size: 14 cm 09/09/2023:  -BCR-ABL.  Quantitative PCR: P210 positive, IS %: 177.85 -Flow cytometry:Bone marrow: 94% abnormal granulocytes, 1.1% abnormal monocytes, negative for a discrete population of plasma cells -Bone marrow biopsy pathology: CML-chronic phase -ABL kinase mutations not detected -JAK2 mutation: Not detected   INTERVAL HISTORY: Alexander Burton 83 y.o. male following for leukocytosis. Patient was accompanied by his wife today.   He has chronic myeloid leukemia (CML) in the chronic phase, confirmed by a bone marrow biopsy showing Philadelphia chromosome positivity. He feels 'no different' since his recent hospital discharge and experiences significant fatigue, which he attributes to his condition.   He experiences significant shortness of breath, particularly when walking short distances, such as from his driveway to his house. He attributes some of his breathing difficulties to his heart condition  He is currently taking Lasix  for swelling and recently received an iron infusion due to low iron levels. In the review of symptoms, he reports no current swelling in his legs, arms, or face, and no abdominal pain, nausea, or vomiting.  I have reviewed the past medical history, past surgical history, social history and family history with  the patient   ALLERGIES:  is allergic to contrast media [iodinated contrast media] and metrizamide.  MEDICATIONS:  Current Outpatient Medications   Medication Sig Dispense Refill   amiodarone  (PACERONE ) 200 MG tablet Take 1 tablet (200 mg total) by mouth daily. 100 tablet 0   apixaban  (ELIQUIS ) 2.5 MG TABS tablet Take 2.5 mg by mouth every 12 (twelve) hours.     asciminib hcl (SCEMBLIX) 40 MG tablet Take 1 tablet (40 mg total) by mouth 2 (two) times daily. Take on empty stomach, at least one hour before or two hours after food. 60 tablet 2   aspirin  EC 325 MG tablet Take 325 mg by mouth every 6 (six) hours as needed for moderate pain (pain score 4-6).     finasteride  (PROSCAR ) 5 MG tablet Take 5 mg by mouth in the morning.     furosemide  (LASIX ) 20 MG tablet Take 0.5 tablets (10 mg total) by mouth every other day. (Patient taking differently: Take 20 mg by mouth. Every 3rd day)     JARDIANCE  10 MG TABS tablet TAKE 1 TABLET(10 MG) BY MOUTH DAILY BEFORE BREAKFAST 30 tablet 5   metoprolol  succinate (TOPROL -XL) 25 MG 24 hr tablet Take 1 tablet (25 mg total) by mouth daily. 90 tablet 3   mirtazapine (REMERON) 7.5 MG tablet Take 7.5 mg by mouth at bedtime.     Multiple Vitamin (MULITIVITAMIN WITH MINERALS) TABS Take 1 tablet by mouth in the morning. Centrum Silver men 50+     Multiple Vitamins-Minerals (PRESERVISION AREDS) CAPS Take 1 capsule by mouth in the morning and at bedtime.      nitroGLYCERIN (NITROSTAT) 0.4 MG SL tablet Place 0.4 mg under the tongue every 5 (five) minutes as needed for chest pain.     Oxymetazoline HCl (AFRIN NASAL SPRAY NA) Place 2 sprays into the nose 2 (two) times daily as needed (congestion).     pantoprazole (PROTONIX) 40 MG tablet Take 40 mg by mouth at bedtime.      Polyvinyl Alcohol-Povidone PF (REFRESH) 1.4-0.6 % SOLN Place 1-2 drops into both eyes 3 (three) times daily as needed (dry/irritated eyes).     potassium chloride  (MICRO-K ) 10 MEQ CR capsule Take 10 mEq by mouth See admin instructions. Every 3rd day     pravastatin  (PRAVACHOL ) 20 MG tablet TAKE 1 TABLET(20 MG) BY MOUTH DAILY (Patient taking differently:  Take 20 mg by mouth at bedtime.) 90 tablet 2   Probiotic Product (PROBIOTIC DAILY PO) Take 1 tablet by mouth in the morning.     tamsulosin  (FLOMAX ) 0.4 MG CAPS capsule Take 0.4 mg by mouth in the morning and at bedtime.   3   ursodiol  (ACTIGALL ) 250 MG tablet Take 1 tablet (250 mg total) by mouth in the morning and at bedtime. 120 tablet 5   No current facility-administered medications for this visit.     REVIEW OF SYSTEMS:   Constitutional: Denies fevers, chills or night sweats Eyes: Denies blurriness of vision Ears, nose, mouth, throat, and face: Denies mucositis or sore throat Respiratory: Denies cough, dyspnea or wheezes Cardiovascular: Denies palpitation, chest discomfort or lower extremity swelling Gastrointestinal:  Denies nausea, heartburn or change in bowel habits Skin: Denies abnormal skin rashes Lymphatics: Denies new lymphadenopathy or easy bruising Neurological:Denies numbness, tingling or new weaknesses Behavioral/Psych: Mood is stable, no new changes  All other systems were reviewed with the patient and are negative.  PHYSICAL EXAMINATION:   Vitals:   10/03/23 0920  BP: 102/69  Pulse: 98  Resp: (!) 98  Temp: (!) 97.4 F (36.3 C)  SpO2: 98%    GENERAL:alert, no distress and comfortable SKIN: skin color, texture, turgor are normal, no rashes or significant lesions LUNGS: clear to auscultation and percussion with normal breathing effort HEART: regular rate & rhythm and no murmurs and no lower extremity edema ABDOMEN:abdomen soft, non-tender and normal bowel sounds Musculoskeletal:no cyanosis of digits and no clubbing  NEURO: alert & oriented x 3 with fluent speech  LABORATORY DATA:  I have reviewed the data as listed and also results reviewed from recent admission at [facility]  Lab Results  Component Value Date   WBC 17.0 (H) 10/03/2023   NEUTROABS 13.6 (H) 10/03/2023   HGB 10.4 (L) 10/03/2023   HCT 35.5 (L) 10/03/2023   MCV 82.2 10/03/2023   PLT 161  10/03/2023       Chemistry      Component Value Date/Time   NA 139 10/03/2023 1001   NA 142 06/23/2014 1122   K 3.9 10/03/2023 1001   K 4.5 06/23/2014 1122   CL 105 10/03/2023 1001   CL 109 (H) 11/19/2012 1044   CO2 26 10/03/2023 1001   CO2 25 06/23/2014 1122   BUN 24 (H) 10/03/2023 1001   BUN 15.2 06/23/2014 1122   CREATININE 1.67 (H) 10/03/2023 1001   CREATININE 1.70 (H) 07/29/2023 1129   CREATININE 1.0 06/23/2014 1122      Component Value Date/Time   CALCIUM 8.2 (L) 10/03/2023 1001   CALCIUM 8.4 06/23/2014 1122   ALKPHOS 75 10/03/2023 1001   ALKPHOS 122 06/23/2014 1122   AST 25 10/03/2023 1001   AST 20 06/23/2014 1122   ALT 17 10/03/2023 1001   ALT 17 06/23/2014 1122   BILITOT 1.0 10/03/2023 1001   BILITOT 1.11 06/23/2014 1122      Latest Reference Range & Units 09/05/23 11:58 09/05/23 11:59  Iron 45 - 182 ug/dL  27 (L)  UIBC ug/dL  161  TIBC 096 - 045 ug/dL  409  Saturation Ratios 17.9 - 39.5 %  7 (L)  Ferritin 24 - 336 ng/mL  51  Folate >5.9 ng/mL  20.8  Copper  69 - 132 ug/dL 811   CRP <9.1 mg/dL  47.8 (H)  Vitamin B12 180 - 914 pg/mL  2,356 (H)  Total Protein ELP 6.0 - 8.5 g/dL  5.9 (L) (C)  Albumin SerPl Elph-Mcnc 2.9 - 4.4 g/dL  3.0 (C)  Albumin/Glob SerPl 0.7 - 1.7   1.1 (C)  Alpha2 Glob SerPl Elph-Mcnc 0.4 - 1.0 g/dL  0.9 (C)  Alpha 1 0.0 - 0.4 g/dL  0.4 (C)  Gamma Glob SerPl Elph-Mcnc 0.4 - 1.8 g/dL  0.6 (C)  M Protein SerPl Elph-Mcnc Not Observed g/dL  Not Observed (C)  IFE 1   Comment (C)  Globulin, Total 2.2 - 3.9 g/dL  2.9 (C)  B-Globulin SerPl Elph-Mcnc 0.7 - 1.3 g/dL  1.0 (C)  IgG (Immunoglobin G), Serum 603 - 1,613 mg/dL  295  IgM (Immunoglobulin M), Srm 15 - 143 mg/dL  621  IgA 61 - 308 mg/dL  657  (L): Data is abnormally low (H): Data is abnormally high (C): Corrected   Latest Reference Range & Units 09/05/23 11:58  Kappa free light chain 3.3 - 19.4 mg/L 38.8 (H)  Lambda free light chains 5.7 - 26.3 mg/L 45.1 (H)  Kappa, lambda light  chain ratio 0.26 - 1.65  0.86  (H): Data is abnormally high  BCR-ABL.  Quantitative PCR: P210 positive, IS %:  177.85 Flow cytometry:Bone marrow: 94% abnormal granulocytes, 1.1% abnormal monocytes, negative for a discrete population of plasma cells

## 2023-10-03 NOTE — Telephone Encounter (Signed)
 Oral Oncology Patient Advocate Encounter   Received notification that prior authorization for Scemblix is required.   PA submitted on 10/03/2023 Key [id number] Status is pending     [name] Benjaman Branch, CPhT Oncology Pharmacy Patient Advocate [facility] Direct Number: [phone] Fax: [phone]

## 2023-10-04 ENCOUNTER — Other Ambulatory Visit (HOSPITAL_COMMUNITY): Payer: Self-pay | Admitting: Cardiology

## 2023-10-04 ENCOUNTER — Ambulatory Visit (HOSPITAL_COMMUNITY)
Admission: RE | Admit: 2023-10-04 | Discharge: 2023-10-04 | Disposition: A | Discharge status: Home / Self Care | Source: Ambulatory Visit | Attending: Internal Medicine | Admitting: Internal Medicine

## 2023-10-04 ENCOUNTER — Other Ambulatory Visit: Payer: Self-pay | Admitting: Pharmacy Technician

## 2023-10-04 ENCOUNTER — Other Ambulatory Visit: Payer: Self-pay

## 2023-10-04 DIAGNOSIS — I5022 Chronic systolic (congestive) heart failure: Secondary | ICD-10-CM | POA: Diagnosis not present

## 2023-10-04 LAB — BASIC METABOLIC PANEL WITH GFR
Anion gap: 8 (ref 5–15)
BUN: 20 mg/dL (ref 8–23)
CO2: 24 mmol/L (ref 22–32)
Calcium: 7.9 mg/dL — ABNORMAL LOW (ref 8.9–10.3)
Chloride: 108 mmol/L (ref 98–111)
Creatinine, Ser: 1.72 mg/dL — ABNORMAL HIGH (ref 0.61–1.24)
GFR, Estimated: 38 mL/min — ABNORMAL LOW (ref >60)
Glucose, Bld: 92 mg/dL (ref 70–99)
Potassium: 4.4 mmol/L (ref 3.5–5.1)
Sodium: 140 mmol/L (ref 135–145)

## 2023-10-04 LAB — CBC
HCT: 33.2 % — ABNORMAL LOW (ref 39.0–52.0)
Hemoglobin: 10.2 g/dL — ABNORMAL LOW (ref 13.0–17.0)
MCH: 24.9 pg — ABNORMAL LOW (ref 26.0–34.0)
MCHC: 30.7 g/dL (ref 30.0–36.0)
MCV: 81.2 fL (ref 80.0–100.0)
Platelets: 129 10*3/uL — ABNORMAL LOW (ref 150–400)
RBC: 4.09 MIL/uL — ABNORMAL LOW (ref 4.22–5.81)
RDW: 18.6 % — ABNORMAL HIGH (ref 11.5–15.5)
WBC: 16.8 10*3/uL — ABNORMAL HIGH (ref 4.0–10.5)
nRBC: 0 % (ref 0.0–0.2)

## 2023-10-04 NOTE — Progress Notes (Signed)
 Patient education documented in EPIC note on 10/04/23.

## 2023-10-04 NOTE — Telephone Encounter (Signed)
 Clinical Pharmacist Practitioner Encounter   [facility] (Specialty) will deliver mediction to the patient on 10/08/23.   Patient Education I spoke with patient's wife Alexander Burton for overview of new oral chemotherapy medication: Scemblix (asciminib) for the treatment of CML , planned duration until disease control or unacceptable drug toxicity.   Counseled Alexander Burton on administration, dosing, side effects, monitoring, drug-food interactions, safe handling, storage, and disposal. Patient will take 1 tablet (40 mg total) by mouth 2 (two) times daily. Take on empty stomach, at least one hour before or two hours after food.   Side effects include but not limited to: decreased wbc/hgb/plt, fatigue, muscle pain, rash, diarrhea, nausea.   Diarrhea: patient knows to use loperamide as needed and call the office if they are having four or more loose stool per day  Reviewed with Alexander Burton importance of keeping a medication schedule and plan for any missed doses.  After discussion with Alexander Burton no patient barriers to medication adherence identified.   Alexander Burton voiced understanding and appreciation. All questions answered. Medication handout provided.  Provided Alexander Burton with Oral Chemotherapy Navigation Clinic phone number. Alexander Burton knows to call the office with questions or concerns. Oral Chemotherapy Navigation Clinic will continue to follow.  Alexander Burton, PharmD, BCOP, CPP Hematology/Oncology Clinical Pharmacist [facility] Oral Chemotherapy Navigation Clinic [phone]  10/04/2023 12:07 PM

## 2023-10-04 NOTE — Progress Notes (Signed)
 Specialty Pharmacy Initial Fill Coordination Note  Alexander Burton is a 83 y.o. male contacted today regarding refills of specialty medication(s) Asciminib HCl (SCEMBLIX) .  Patient requested Delivery (please leave on car hood located in the garage per pt request)  on 10/08/23  to verified address [address] [name] [address]   Medication will be filled on 10/07/2023.   Patient is aware of $0 copayment.   Please leave on car hood located in the garage per pt request   [name] Benjaman Branch, CPhT Oncology Pharmacy Patient Advocate [facility] Direct Number: [phone] Fax: [phone]

## 2023-10-07 ENCOUNTER — Other Ambulatory Visit: Payer: Self-pay

## 2023-10-07 NOTE — Progress Notes (Signed)
 Called patient with pre-procedure instructions for tomorrow.   Patient informed of:   Time to arrive for procedure. 1130 Remain NPO past midnight.  Must have a ride home and a responsible adult to remain with them for 24 hours post procedure.  Confirmed blood thinner. Eliquis  Confirmed no breaks in taking blood thinner for 3+ weeks prior to procedure. Confirmed patient stopped all GLP-1s and GLP-2s for at least one week before procedure.

## 2023-10-08 ENCOUNTER — Encounter (HOSPITAL_COMMUNITY): Payer: Self-pay | Admitting: Cardiology

## 2023-10-08 ENCOUNTER — Ambulatory Visit (HOSPITAL_COMMUNITY)
Admission: RE | Admit: 2023-10-08 | Discharge: 2023-10-08 | Disposition: A | Discharge status: Home / Self Care | Attending: Cardiology | Admitting: Cardiology

## 2023-10-08 ENCOUNTER — Ambulatory Visit (HOSPITAL_COMMUNITY)

## 2023-10-08 ENCOUNTER — Encounter (HOSPITAL_COMMUNITY)
Admission: RE | Disposition: A | Discharge status: Home / Self Care | Payer: Self-pay | Source: Home / Self Care | Attending: Cardiology

## 2023-10-08 ENCOUNTER — Other Ambulatory Visit: Payer: Self-pay

## 2023-10-08 DIAGNOSIS — G4733 Obstructive sleep apnea (adult) (pediatric): Secondary | ICD-10-CM | POA: Insufficient documentation

## 2023-10-08 DIAGNOSIS — I4892 Unspecified atrial flutter: Secondary | ICD-10-CM | POA: Insufficient documentation

## 2023-10-08 DIAGNOSIS — K219 Gastro-esophageal reflux disease without esophagitis: Secondary | ICD-10-CM | POA: Diagnosis not present

## 2023-10-08 DIAGNOSIS — Z87891 Personal history of nicotine dependence: Secondary | ICD-10-CM | POA: Diagnosis not present

## 2023-10-08 DIAGNOSIS — Z955 Presence of coronary angioplasty implant and graft: Secondary | ICD-10-CM | POA: Diagnosis not present

## 2023-10-08 DIAGNOSIS — I10 Essential (primary) hypertension: Secondary | ICD-10-CM

## 2023-10-08 DIAGNOSIS — J449 Chronic obstructive pulmonary disease, unspecified: Secondary | ICD-10-CM | POA: Diagnosis not present

## 2023-10-08 DIAGNOSIS — I4819 Other persistent atrial fibrillation: Secondary | ICD-10-CM | POA: Diagnosis not present

## 2023-10-08 DIAGNOSIS — Z79899 Other long term (current) drug therapy: Secondary | ICD-10-CM | POA: Diagnosis not present

## 2023-10-08 DIAGNOSIS — I5022 Chronic systolic (congestive) heart failure: Secondary | ICD-10-CM | POA: Insufficient documentation

## 2023-10-08 DIAGNOSIS — I110 Hypertensive heart disease with heart failure: Secondary | ICD-10-CM | POA: Diagnosis not present

## 2023-10-08 DIAGNOSIS — I4891 Unspecified atrial fibrillation: Secondary | ICD-10-CM | POA: Diagnosis present

## 2023-10-08 DIAGNOSIS — I2510 Atherosclerotic heart disease of native coronary artery without angina pectoris: Secondary | ICD-10-CM | POA: Diagnosis not present

## 2023-10-08 DIAGNOSIS — Z7901 Long term (current) use of anticoagulants: Secondary | ICD-10-CM | POA: Diagnosis not present

## 2023-10-08 DIAGNOSIS — I480 Paroxysmal atrial fibrillation: Secondary | ICD-10-CM

## 2023-10-08 HISTORY — PX: CARDIOVERSION: EP1203

## 2023-10-08 SURGERY — CARDIOVERSION (CATH LAB)
Anesthesia: General

## 2023-10-08 MED ORDER — LIDOCAINE 2% (20 MG/ML) 5 ML SYRINGE
INTRAMUSCULAR | Status: DC | PRN
  Administered 2023-10-08: 60 mg via INTRAVENOUS

## 2023-10-08 MED ORDER — PROPOFOL 10 MG/ML IV BOLUS
INTRAVENOUS | Status: DC | PRN
  Administered 2023-10-08: 50 mg via INTRAVENOUS

## 2023-10-08 SURGICAL SUPPLY — 1 items: PAD DEFIB RADIO PHYSIO CONN (PAD) ×2 IMPLANT

## 2023-10-08 NOTE — Procedures (Signed)
 Electrical Cardioversion Procedure Note Alexander Burton [id number] [birth year]  Procedure: Electrical Cardioversion Indications:  Atrial Flutter  Procedure Details Consent: Risks of procedure as well as the alternatives and risks of each were explained to the (patient/caregiver).  Consent for procedure obtained. Time Out: Verified patient identification, verified procedure, site/side was marked, verified correct patient position, special equipment/implants available, medications/allergies/relevent history reviewed, required imaging and test results available.  Performed  Patient placed on cardiac monitor, pulse oximetry, supplemental oxygen  as necessary.  Sedation given: Propofol  per anesthesiology Pacer pads placed anterior and posterior chest.  Cardioverted 1 time(s).  Cardioverted at 150J.  Evaluation Findings: Post procedure EKG shows: NSR Complications: None Patient did tolerate procedure well.   Alexander Burton 10/08/2023, 1:03 PM

## 2023-10-08 NOTE — Discharge Instructions (Signed)

## 2023-10-08 NOTE — Transfer of Care (Signed)
 Immediate Anesthesia Transfer of Care Note  Patient: Alexander Burton  Procedure(s) Performed: CARDIOVERSION  Patient Location: PACU  Anesthesia Type:General  Level of Consciousness: drowsy  Airway & Oxygen  Therapy: Patient Spontanous Breathing and Patient connected to face mask oxygen   Post-op Assessment: Report given to RN and Post -op Vital signs reviewed and stable  Post vital signs: Reviewed and stable  Last Vitals:  Vitals Value Taken Time  BP    Temp    Pulse    Resp    SpO2      Last Pain:  Vitals:   10/08/23 1133  TempSrc: Temporal         Complications: No notable events documented.

## 2023-10-08 NOTE — Anesthesia Postprocedure Evaluation (Signed)
 Anesthesia Post Note  Patient: Alexander Burton  Procedure(s) Performed: CARDIOVERSION     Patient location during evaluation: PACU Anesthesia Type: General Level of consciousness: awake and alert Pain management: pain level controlled Vital Signs Assessment: post-procedure vital signs reviewed and stable Respiratory status: spontaneous breathing, nonlabored ventilation, respiratory function stable and patient connected to nasal cannula oxygen  Cardiovascular status: blood pressure returned to baseline and stable Postop Assessment: no apparent nausea or vomiting Anesthetic complications: no   No notable events documented.  Last Vitals:  Vitals:   10/08/23 1310 10/08/23 1320  BP: (!) 87/62 103/63  Pulse: (!) 58 61  Resp: (!) 8 12  Temp:  36.9 C  SpO2: 99% 100%    Last Pain:  Vitals:   10/08/23 1320  TempSrc: Temporal  PainSc: 0-No pain                 Lethaniel Rave

## 2023-10-08 NOTE — Interval H&P Note (Signed)
 History and Physical Interval Note:  10/08/2023 12:33 PM  Alexander Burton  has presented today for surgery, with the diagnosis of AFLUTTER.  The various methods of treatment have been discussed with the patient and family. After consideration of risks, benefits and other options for treatment, the patient has consented to  Procedure(s): CARDIOVERSION (N/A) as a surgical intervention.  The patient's history has been reviewed, patient examined, no change in status, stable for surgery.  I have reviewed the patient's chart and labs.  Questions were answered to the patient's satisfaction.     [name] [employer]

## 2023-10-08 NOTE — Anesthesia Preprocedure Evaluation (Addendum)
 Anesthesia Evaluation  Patient identified by MRN, date of birth, ID band Patient awake    Reviewed: Allergy & Precautions, H&P , NPO status , Patient's Chart, lab work & pertinent test results  Airway Mallampati: II  TM Distance: >3 FB Neck ROM: Full    Dental no notable dental hx. (+) Poor Dentition, Dental Advisory Given, Partial Lower, Partial Upper   Pulmonary sleep apnea , COPD, former smoker   Pulmonary exam normal breath sounds clear to auscultation       Cardiovascular Exercise Tolerance: Good hypertension, + CAD  Normal cardiovascular exam+ dysrhythmias Atrial Fibrillation  Rhythm:Regular Rate:Normal  TTE 05/2023:  IMPRESSIONS     1. Left ventricular ejection fraction, by estimation, is 45 to 50%. The  left ventricle has mildly decreased function. The left ventricle has no  regional wall motion abnormalities. There is mild concentric left  ventricular hypertrophy. Left ventricular  diastolic parameters are consistent with Grade I diastolic dysfunction  (impaired relaxation).   2. Right ventricular systolic function is normal. The right ventricular  size is normal.   3. Left atrial size was severely dilated.   4. Right atrial size was mild to moderately dilated.   5. The mitral valve is degenerative. Mild mitral valve regurgitation. No  evidence of mitral stenosis. There is mild late systolic prolapse of both  leaflets of the mitral valve.   6. The aortic valve is normal in structure. Aortic valve regurgitation is  not visualized. No aortic stenosis is present.   7. The inferior vena cava is normal in size with greater than 50%  respiratory variability, suggesting right atrial pressure of 3 mmHg.     Neuro/Psych negative neurological ROS  negative psych ROS   GI/Hepatic negative GI ROS, Neg liver ROS,GERD  Medicated,,  Endo/Other  negative endocrine ROS    Renal/GU negative Renal ROS  negative  genitourinary   Musculoskeletal negative musculoskeletal ROS (+)    Abdominal   Peds negative pediatric ROS (+)  Hematology negative hematology ROS (+) Blood dyscrasia, anemia   Anesthesia Other Findings   Reproductive/Obstetrics negative OB ROS                             Anesthesia Physical Anesthesia Plan  ASA: 3  Anesthesia Plan: General   Post-op Pain Management: Minimal or no pain anticipated   Induction: Intravenous  PONV Risk Score and Plan: 2 and Treatment may vary due to age or medical condition and Propofol  infusion  Airway Management Planned: Mask, Natural Airway and Simple Face Mask  Additional Equipment: None  Intra-op Plan:   Post-operative Plan:   Informed Consent: I have reviewed the patients History and Physical, chart, labs and discussed the procedure including the risks, benefits and alternatives for the proposed anesthesia with the patient or authorized representative who has indicated his/her understanding and acceptance.       Plan Discussed with: Anesthesiologist and CRNA  Anesthesia Plan Comments: (  )       Anesthesia Quick Evaluation

## 2023-10-09 ENCOUNTER — Inpatient Hospital Stay

## 2023-10-09 VITALS — BP 105/72 | HR 58 | Temp 97.4°F | Resp 18

## 2023-10-09 DIAGNOSIS — R0602 Shortness of breath: Secondary | ICD-10-CM | POA: Diagnosis not present

## 2023-10-09 DIAGNOSIS — D509 Iron deficiency anemia, unspecified: Secondary | ICD-10-CM | POA: Diagnosis not present

## 2023-10-09 DIAGNOSIS — Z79899 Other long term (current) drug therapy: Secondary | ICD-10-CM | POA: Diagnosis not present

## 2023-10-09 DIAGNOSIS — C9210 Chronic myeloid leukemia, BCR/ABL-positive, not having achieved remission: Secondary | ICD-10-CM | POA: Diagnosis not present

## 2023-10-09 DIAGNOSIS — Z5111 Encounter for antineoplastic chemotherapy: Secondary | ICD-10-CM | POA: Diagnosis not present

## 2023-10-09 LAB — BCR-ABL1, CML/ALL, PCR, QUANT
E1A2 Transcript: 0.025 %
Interpretation (BCRAL):: POSITIVE — AB
b2a2 transcript: 20.524 %
b3a2 transcript: 19.2253 %

## 2023-10-09 MED ORDER — SODIUM CHLORIDE 0.9 % IV SOLN: INTRAVENOUS | Status: DC

## 2023-10-09 MED ORDER — CETIRIZINE HCL 10 MG/ML IV SOLN
5.0000 mg | Freq: Once | INTRAVENOUS | Status: AC
  Administered 2023-10-09: 5 mg via INTRAVENOUS
  Filled 2023-10-09: qty 1

## 2023-10-09 MED ORDER — ACETAMINOPHEN 325 MG PO TABS
650.0000 mg | ORAL_TABLET | Freq: Once | ORAL | Status: AC
Start: 2023-10-09 — End: 2023-10-09
  Administered 2023-10-09: 650 mg via ORAL
  Filled 2023-10-09: qty 2

## 2023-10-09 MED ORDER — SODIUM CHLORIDE 0.9 % IV SOLN
1000.0000 mg | Freq: Once | INTRAVENOUS | Status: AC
  Administered 2023-10-09: 1000 mg via INTRAVENOUS
  Filled 2023-10-09: qty 1000

## 2023-10-09 NOTE — Patient Instructions (Signed)
 [facility] [place] - A DEPT OF [facility]  Discharge Instructions: Thank you for choosing [facility] to provide your oncology and hematology care.  If you have a lab appointment with the Cancer Center - please note that after April 8th, 2024, all labs will be drawn in the cancer center.  You do not have to check in or register with the main entrance as you have in the past but will complete your check-in in the cancer center.  Wear comfortable clothing and clothing appropriate for easy access to any Portacath or PICC line.   We strive to give you quality time with your provider. You may need to reschedule your appointment if you arrive late (15 or more minutes).  Arriving late affects you and other patients whose appointments are after yours.  Also, if you miss three or more appointments without notifying the office, you may be dismissed from the clinic at the provider's discretion.      For prescription refill requests, have your pharmacy contact our office and allow 72 hours for refills to be completed.    Today you received the following chemotherapy and/or immunotherapy agents Monoferric. Ferric Derisomaltose Injection What is this medication? FERRIC DERISOMALTOSE (FER ik der EYE soe MAWL tose) treats low levels of iron in your body (iron deficiency anemia). Iron is a mineral that plays an important role in making red blood cells, which carry oxygen from your lungs to the rest of your body. This medicine may be used for other purposes; ask your health care provider or pharmacist if you have questions. COMMON BRAND NAME(S): MONOFERRIC What should I tell my care team before I take this medication? They need to know if you have any of these conditions: High levels of iron in the blood An unusual or allergic reaction to iron, other medications, foods, dyes, or preservatives Pregnant or trying to get pregnant Breastfeeding How should I use this  medication? This medication is injected into a vein. It is given by your care team in a hospital or clinic setting. Talk to your care team about the use of this medication in children. Special care may be needed. Overdosage: If you think you have taken too much of this medicine contact a poison control center or emergency room at once. NOTE: This medicine is only for you. Do not share this medicine with others. What if I miss a dose? It is important not to miss your dose. Call your care team if you are unable to keep an appointment. What may interact with this medication? Do not take this medication with any of the following: Deferoxamine Dimercaprol Other iron products This list may not describe all possible interactions. Give your health care provider a list of all the medicines, herbs, non-prescription drugs, or dietary supplements you use. Also tell them if you smoke, drink alcohol, or use illegal drugs. Some items may interact with your medicine. What should I watch for while using this medication? Visit your care team for regular checks on your progress. Tell your care team if your symptoms do not start to get better or if they get worse. You may need blood work done while you are taking this medication. You may need to eat more foods that contain iron. Talk to your care team. Foods that contain iron include whole grains or cereals, dried fruits, beans, peas, leafy green vegetables, and organ meats (liver, kidney). What side effects may I notice from receiving this  medication? Side effects that you should report to your care team as soon as possible: Allergic reactions--skin rash, itching, hives, swelling of the face, lips, tongue, or throat Low blood pressure--dizziness, feeling faint or lightheaded, blurry vision Shortness of breath Side effects that usually do not require medical attention (report to your care team if they continue or are bothersome): Flushing Headache Joint  pain Muscle pain Nausea Pain, redness, or irritation at injection site This list may not describe all possible side effects. Call your doctor for medical advice about side effects. You may report side effects to FDA at 1-800-FDA-1088. Where should I keep my medication? This medication is given in a hospital or clinic. It will not be stored at home. NOTE: This sheet is a summary. It may not cover all possible information. If you have questions about this medicine, talk to your doctor, pharmacist, or health care provider.  2024 Elsevier/Gold Standard (2023-01-02 00:00:00)      To help prevent nausea and vomiting after your treatment, we encourage you to take your nausea medication as directed.  BELOW ARE SYMPTOMS THAT SHOULD BE REPORTED IMMEDIATELY: *FEVER GREATER THAN 100.4 F (38 C) OR HIGHER *CHILLS OR SWEATING *NAUSEA AND VOMITING THAT IS NOT CONTROLLED WITH YOUR NAUSEA MEDICATION *UNUSUAL SHORTNESS OF BREATH *UNUSUAL BRUISING OR BLEEDING *URINARY PROBLEMS (pain or burning when urinating, or frequent urination) *BOWEL PROBLEMS (unusual diarrhea, constipation, pain near the anus) TENDERNESS IN MOUTH AND THROAT WITH OR WITHOUT PRESENCE OF ULCERS (sore throat, sores in mouth, or a toothache) UNUSUAL RASH, SWELLING OR PAIN  UNUSUAL VAGINAL DISCHARGE OR ITCHING   Items with * indicate a potential emergency and should be followed up as soon as possible or go to the Emergency Department if any problems should occur.  Please show the CHEMOTHERAPY ALERT CARD or IMMUNOTHERAPY ALERT CARD at check-in to the Emergency Department and triage nurse.  Should you have questions after your visit or need to cancel or reschedule your appointment, please contact [facility] [place] - A DEPT OF [facility] [phone]  and follow the prompts.  Office hours are 8:00 a.m. to 4:30 p.m. Monday - Friday. Please note that voicemails left after 4:00 p.m. may not be returned until the  following business day.  We are closed weekends and major holidays. You have access to a nurse at all times for urgent questions. Please call the main number to the clinic [phone] and follow the prompts.  For any non-urgent questions, you may also contact your provider using MyChart. We now offer e-Visits for anyone 49 and older to request care online for non-urgent symptoms. For details visit [email or web address].   Also download the MyChart app! Go to the app store, search "MyChart", open the app, select [place], and log in with your MyChart username and password.

## 2023-10-09 NOTE — Progress Notes (Signed)
 Patient presents today for Monoferric infusion.  Vital signs stable. Patient has no complaints today.   Monoferric given today per MD orders. Tolerated infusion without adverse affects. Vital signs stable. No complaints at this time. Discharged from clinic ambulatory in stable condition. Alert and oriented x 3. F/U with [facility] as scheduled.

## 2023-10-17 ENCOUNTER — Ambulatory Visit: Admitting: Oncology

## 2023-10-22 NOTE — Progress Notes (Signed)
 PCP: [name], MD Cardiology: Dr. Londa Rival HF Cardiology: Dr. Mitzie Anda  Reason for Visit: f/u for systolic heart failure; f/u post DCCV for AFL   83 y.o. with history of persistent atrial fibrillation, CAD, and CHF. He had a DES placed in the LAD in 2/10.  He has a hepaticojejunostomy post-complicated cholecystectomy with common bile duct rupture and chronic hepatic abscess. He has had recurrent cholangitis and recurrent biliary strictures.  In 2023, he reported worsening dyspnea for several months. Echo in 11/23 showed EF 40-45%, mild LV dilation, mild LVH, mildly decreased RV systolic function, mild MR, mild-moderate TR. He was noted to be in atrial fibrillation since 11/23.  He had not been anticoagulated in the past due to low platelets though recently they have been running in the 80s-100s. He has a history of orthostatic hypotension.    Echo in 5/24 showed EF 35%, global hypokinesis, mild LVH, mild RV enlargement and mildly decreased systolic function, severe biatrial enlargement.   Anticoagulation was started and patient had DCCV to NSR in 6/24.    Underwent routine biliary tube exchange at [facility] and had later admitted for SBO in 8/24. Treated with bowel rest and NGT.  Echo 1/25 showed EF improved at 45-50%.  Zio 4/25 showed 100% AF/AFL burden.  Admitted 4/25 at [facility] with CAP. Underwent diagnostic thoracentesis showing exudative effusion with lymphocytic predominance. Hematology consulted, underwent BMBx for further evaluation for hematologic malignancy. Noted to be in AFL during admission.  Discharged home off spiro due to AKI.  He had hospital f/u in our clinic 5/2 and reported worsening dyspnea since being back in AFL. He was noted to be euvolemic on exam and by ReDs, 26%. He was subsequently set up for outpatient DCCV. This was done on 5/13. He converted back to NSR after 1 attempt.   He presents back today for post cardioversion f/u and for f/u for systolic heart failure     Of  note, he is following with Hem/Onc. Recent BMBx showed chronic myelogenous leukemia (CML), BCR::ABL1-positive and Plasma cell dyscrasia, ~5% plasma cells on core biopsy with slight lambda predominance. He has f/u w/ Dr. Orvis Blare on 6/5.     ECG (personally reviewed): ***   ReDs reading: ***  Labs (3/24): LDL 78, plts 106K, K 4, creatinine 1.28 Labs (5/24): K 4.1, creatinine 1.27, plts 84  Labs (6/24): K 4.1, creatinine 1.53 Labs (8/24): K 3.9, creatinine 1.6, plts 65 Labs (9/24): LDL 66, K 5.1, creatinine 8.65, LFTs normal Labs (3/25): K 4.6, creatinine 1.7, hgb 11.2, WBC 19.5 Labs (4/25): K 4.1, creatinine 1.81  PMH: 1. CAD: DES to LAD in 2/10.  - Cardiolite  (5/23): EF 48%, no ischemia/infarction.  2. COPD 3. BPH 4. Hyperlipidemia 5. Melanoma 6. Atrial fibrillation: Persistent - DCCV to NSR in 6/24  7. Chronic thrombocytopenia 8. OSA 9. Chronic dysphagia 10. Biliary system disease: Complicated cholecystectomy with rupture of common bile duct, chronic hepatic abscess after this.   - Hepaticojejunostomy with recurrent cholangitis.  - Recurrent biliary stricture requiring dilations.  11. Chronic HF with mid range EF: Echo (2/18) with EF 55-60%.  Echo (11/23) with EF 40-45%, mild LV dilation, mild LVH, mildly decreased RV systolic function, mild MR, mild-moderate TR.  - Echo (5/24): EF 35%, global hypokinesis, mild LVH, mild RV enlargement and mildly decreased systolic function, severe biatrial enlargement.  - Echo (1/25): EF 45-50%, normal RV  SH: Married, [place], nonsmoker, no ETOH.   Family History  Problem Relation Age  of Onset   Colon cancer Sister    Heart disease Mother    Emphysema Father    ROS: All systems reviewed and negative except as per HPI.  Current Outpatient Medications  Medication Sig Dispense Refill   amiodarone  (PACERONE ) 200 MG tablet Take 1 tablet (200 mg total) by mouth daily. 100 tablet 0   apixaban  (ELIQUIS ) 2.5 MG TABS  tablet Take 2.5 mg by mouth every 12 (twelve) hours.     asciminib hcl (SCEMBLIX ) 40 MG tablet Take 1 tablet (40 mg total) by mouth 2 (two) times daily. Take on empty stomach, at least one hour before or two hours after food. 60 tablet 2   aspirin  EC 325 MG tablet Take 325 mg by mouth every 6 (six) hours as needed for moderate pain (pain score 4-6).     finasteride  (PROSCAR ) 5 MG tablet Take 5 mg by mouth in the morning.     furosemide  (LASIX ) 20 MG tablet Take 0.5 tablets (10 mg total) by mouth every other day. (Patient taking differently: Take 20 mg by mouth. Every 3rd day)     JARDIANCE  10 MG TABS tablet TAKE 1 TABLET(10 MG) BY MOUTH DAILY BEFORE BREAKFAST 30 tablet 5   metoprolol  succinate (TOPROL -XL) 25 MG 24 hr tablet Take 1 tablet (25 mg total) by mouth daily. 90 tablet 3   mirtazapine (REMERON) 7.5 MG tablet Take 7.5 mg by mouth at bedtime.     Multiple Vitamin (MULITIVITAMIN WITH MINERALS) TABS Take 1 tablet by mouth in the morning. Centrum Silver men 50+     Multiple Vitamins-Minerals (PRESERVISION AREDS) CAPS Take 1 capsule by mouth in the morning and at bedtime.      nitroGLYCERIN (NITROSTAT) 0.4 MG SL tablet Place 0.4 mg under the tongue every 5 (five) minutes as needed for chest pain.     Oxymetazoline HCl (AFRIN NASAL SPRAY NA) Place 2 sprays into the nose 2 (two) times daily as needed (congestion).     pantoprazole (PROTONIX) 40 MG tablet Take 40 mg by mouth at bedtime.      Polyvinyl Alcohol-Povidone PF (REFRESH) 1.4-0.6 % SOLN Place 1-2 drops into both eyes 3 (three) times daily as needed (dry/irritated eyes).     potassium chloride  (MICRO-K ) 10 MEQ CR capsule Take 10 mEq by mouth See admin instructions. Every 3rd day     pravastatin  (PRAVACHOL ) 20 MG tablet TAKE 1 TABLET(20 MG) BY MOUTH DAILY (Patient taking differently: Take 20 mg by mouth at bedtime.) 90 tablet 2   Probiotic Product (PROBIOTIC DAILY PO) Take 1 tablet by mouth in the morning.     tamsulosin  (FLOMAX ) 0.4 MG CAPS  capsule Take 0.4 mg by mouth in the morning and at bedtime.   3   ursodiol  (ACTIGALL ) 250 MG tablet Take 1 tablet (250 mg total) by mouth in the morning and at bedtime. 120 tablet 5   No current facility-administered medications for this visit.   Wt Readings from Last 3 Encounters:  10/03/23 79.4 kg (175 lb)  09/27/23 78 kg (172 lb)  09/20/23 78.2 kg (172 lb 6.4 oz)   There were no vitals taken for this visit. PHYSICAL EXAM: General:  Well appearing. No respiratory difficulty HEENT: normal Neck: supple. no JVD. Carotids 2+ bilat; no bruits. No lymphadenopathy or thyromegaly appreciated. Cor: PMI nondisplaced. Regular rate & rhythm. No rubs, gallops or murmurs. Lungs: clear Abdomen: soft, nontender, nondistended. No hepatosplenomegaly. No bruits or masses. Good bowel sounds. Extremities: no cyanosis, clubbing, rash, edema Neuro: alert &  oriented x 3, cranial nerves grossly intact. moves all 4 extremities w/o difficulty. Affect pleasant.   Assessment/Plan: 1. Chronic HF with mid-range EF: Echo (11/23) with EF 40-45%, mild LV dilation, mild LVH, mildly decreased RV systolic function, mild MR, mild-moderate TR.  Cardiolite  in 5/23 with no ischemia or infarction.  Echo in 5/24 with EF 35%, global hypokinesis, mild LVH, mild RV enlargement and mildly decreased systolic function, severe biatrial enlargement.  The onset of atrial fibrillation may have triggered CHF worsening, now s/p DCCV to NSR in 6/24.  He has had no chest pain.  Echo (1/25): EF 45-50%, normal RV.  - NYHA Class  - Continue Jardiance  10 mg daily.  - Continue Lasix  10 mg every other day (patient says that he responds very aggressively to Lasix  20 mg daily).  - Continue Toprol  XL 25 mg daily.  - Off spiro with recent AKI. - history of orthostatic hypotension, but has not needed midodrine  recently. May need to revisit if BP not improved after restoration of NSR. 2. Atrial fibrillation: Persistent.  He had been in atrial  fibrillation since 11/23.  This seemed to coincide with worsening of CHF symptoms. He had not been anticoagulated due to low platelets. However, looking back, his platelets seem to range 80K-100K which is not markedly low.  Anticoagulation has been started and he is doing well with it so far.  S/p DCCV in 6/24. Had been in NSR until ~ 08/2023. Zio 4/25 showed 100% AF/AFL burden. Underwent repeat DCCV on 5/13 back to NSR. EKG today shows ***  - Continue Toprol  XL 25 mg daily.  - Continue apixaban  2.5 mg bid (age + SCr). No bleeding issues. - Continue amiodarone  200 mg daily. 3. Recurrent biliary stricture with hepaticojejunostomy: Biliary stent has been removed.  4. CAD: DES to LAD in 2/10.  No chest pain.  Cardiolite  in 5/23 showed no ischemia. EF was lower on last echo but this may be related to AF, will hold off on cath for now with no chest pain. Echo 1/25 showed EF improved to 45-50% with restoration of NSR. - Continue pravastatin , lipids acceptable in 9/24. Repeat Lipid panel today. LDL goal < 55  5. Leukocytosis: Following with Heme/Onc. Results of BMBx demonstrated Chronic myelogenous leukemia (CML), BCR::ABL1-positive + Plasma cell dyscrasia, ~5% plasma cells on core biopsy with slight lambda predominance. - he has f/u w/ hem/onc next wk to discuss therapies   F/u ***   Ruddy Corral PA-C  10/22/2023

## 2023-10-23 ENCOUNTER — Inpatient Hospital Stay

## 2023-10-23 ENCOUNTER — Telehealth (HOSPITAL_COMMUNITY): Payer: Self-pay

## 2023-10-23 ENCOUNTER — Telehealth: Payer: Self-pay | Admitting: *Deleted

## 2023-10-23 DIAGNOSIS — C9210 Chronic myeloid leukemia, BCR/ABL-positive, not having achieved remission: Secondary | ICD-10-CM

## 2023-10-23 DIAGNOSIS — Z79899 Other long term (current) drug therapy: Secondary | ICD-10-CM | POA: Diagnosis not present

## 2023-10-23 DIAGNOSIS — R0602 Shortness of breath: Secondary | ICD-10-CM | POA: Diagnosis not present

## 2023-10-23 DIAGNOSIS — D509 Iron deficiency anemia, unspecified: Secondary | ICD-10-CM | POA: Diagnosis not present

## 2023-10-23 DIAGNOSIS — Z5111 Encounter for antineoplastic chemotherapy: Secondary | ICD-10-CM | POA: Diagnosis not present

## 2023-10-23 LAB — CBC WITH DIFFERENTIAL/PLATELET
Abs Immature Granulocytes: 0.05 10*3/uL (ref 0.00–0.07)
Basophils Absolute: 0 10*3/uL (ref 0.0–0.1)
Basophils Relative: 0 %
Eosinophils Absolute: 0.1 10*3/uL (ref 0.0–0.5)
Eosinophils Relative: 1 %
HCT: 38.9 % — ABNORMAL LOW (ref 39.0–52.0)
Hemoglobin: 11.6 g/dL — ABNORMAL LOW (ref 13.0–17.0)
Immature Granulocytes: 1 %
Lymphocytes Relative: 7 %
Lymphs Abs: 0.7 10*3/uL (ref 0.7–4.0)
MCH: 26.1 pg (ref 26.0–34.0)
MCHC: 29.8 g/dL — ABNORMAL LOW (ref 30.0–36.0)
MCV: 87.4 fL (ref 80.0–100.0)
Monocytes Absolute: 0.5 10*3/uL (ref 0.1–1.0)
Monocytes Relative: 4 %
Neutro Abs: 9.4 10*3/uL — ABNORMAL HIGH (ref 1.7–7.7)
Neutrophils Relative %: 87 %
Platelets: 85 10*3/uL — ABNORMAL LOW (ref 150–400)
RBC: 4.45 MIL/uL (ref 4.22–5.81)
RDW: 23.9 % — ABNORMAL HIGH (ref 11.5–15.5)
WBC: 10.8 10*3/uL — ABNORMAL HIGH (ref 4.0–10.5)
nRBC: 0 % (ref 0.0–0.2)

## 2023-10-23 LAB — FOLATE: Folate: 20.7 ng/mL (ref >5.9)

## 2023-10-23 LAB — COMPREHENSIVE METABOLIC PANEL WITH GFR
ALT: 21 U/L (ref 0–44)
AST: 23 U/L (ref 15–41)
Albumin: 3 g/dL — ABNORMAL LOW (ref 3.5–5.0)
Alkaline Phosphatase: 117 U/L (ref 38–126)
Anion gap: 9 (ref 5–15)
BUN: 19 mg/dL (ref 8–23)
CO2: 23 mmol/L (ref 22–32)
Calcium: 8 mg/dL — ABNORMAL LOW (ref 8.9–10.3)
Chloride: 107 mmol/L (ref 98–111)
Creatinine, Ser: 1.19 mg/dL (ref 0.61–1.24)
GFR, Estimated: 60 mL/min — ABNORMAL LOW (ref >60)
Glucose, Bld: 135 mg/dL — ABNORMAL HIGH (ref 70–99)
Potassium: 4.1 mmol/L (ref 3.5–5.1)
Sodium: 139 mmol/L (ref 135–145)
Total Bilirubin: 1.6 mg/dL — ABNORMAL HIGH (ref 0.0–1.2)
Total Protein: 6.1 g/dL — ABNORMAL LOW (ref 6.5–8.1)

## 2023-10-23 LAB — IRON AND TIBC
Iron: 43 ug/dL — ABNORMAL LOW (ref 45–182)
Saturation Ratios: 16 % — ABNORMAL LOW (ref 17.9–39.5)
TIBC: 274 ug/dL (ref 250–450)
UIBC: 231 ug/dL

## 2023-10-23 LAB — VITAMIN B12: Vitamin B-12: 1712 pg/mL — ABNORMAL HIGH (ref 180–914)

## 2023-10-23 LAB — URIC ACID: Uric Acid, Serum: 3.4 mg/dL — ABNORMAL LOW (ref 3.7–8.6)

## 2023-10-23 LAB — FERRITIN: Ferritin: 259 ng/mL (ref 24–336)

## 2023-10-23 LAB — LACTATE DEHYDROGENASE: LDH: 148 U/L (ref 98–192)

## 2023-10-23 NOTE — Telephone Encounter (Signed)
 Called to confirm/remind patient of their appointment [facility] on 10/24/23.   Appointment:   [x] Confirmed  [] Left mess   [] No answer/No voice mail  [] VM Full/unable to leave message  [] Phone not in service  Patient reminded to bring all medications and/or complete list.  Confirmed patient has transportation. Gave directions, instructed to utilize valet parking.

## 2023-10-23 NOTE — Telephone Encounter (Signed)
 Patient's wife advised that he has 2 molars that need to be extracted.  Per Dr. Orvis Blare, patient should hold the asciminib 2 days before and 1 day after removal. Hold longer if there is more bleeding during the procedure. Prophylactic abx as usual, per dentist.  Verbalized understanding.

## 2023-10-24 ENCOUNTER — Encounter (HOSPITAL_COMMUNITY): Payer: Self-pay

## 2023-10-24 ENCOUNTER — Telehealth (HOSPITAL_COMMUNITY): Payer: Self-pay | Admitting: *Deleted

## 2023-10-24 ENCOUNTER — Ambulatory Visit (HOSPITAL_COMMUNITY)
Admission: RE | Admit: 2023-10-24 | Discharge: 2023-10-24 | Disposition: A | Discharge status: Home / Self Care | Source: Ambulatory Visit | Attending: Cardiology | Admitting: Cardiology

## 2023-10-24 DIAGNOSIS — I5022 Chronic systolic (congestive) heart failure: Secondary | ICD-10-CM

## 2023-10-24 DIAGNOSIS — I4819 Other persistent atrial fibrillation: Secondary | ICD-10-CM | POA: Diagnosis not present

## 2023-10-24 DIAGNOSIS — I2510 Atherosclerotic heart disease of native coronary artery without angina pectoris: Secondary | ICD-10-CM | POA: Diagnosis not present

## 2023-10-24 DIAGNOSIS — Z7901 Long term (current) use of anticoagulants: Secondary | ICD-10-CM | POA: Insufficient documentation

## 2023-10-24 DIAGNOSIS — K029 Dental caries, unspecified: Secondary | ICD-10-CM | POA: Diagnosis not present

## 2023-10-24 DIAGNOSIS — R9431 Abnormal electrocardiogram [ECG] [EKG]: Secondary | ICD-10-CM | POA: Diagnosis not present

## 2023-10-24 DIAGNOSIS — T451X5A Adverse effect of antineoplastic and immunosuppressive drugs, initial encounter: Secondary | ICD-10-CM | POA: Diagnosis not present

## 2023-10-24 DIAGNOSIS — Z8249 Family history of ischemic heart disease and other diseases of the circulatory system: Secondary | ICD-10-CM | POA: Insufficient documentation

## 2023-10-24 DIAGNOSIS — Z955 Presence of coronary angioplasty implant and graft: Secondary | ICD-10-CM | POA: Diagnosis not present

## 2023-10-24 DIAGNOSIS — C9210 Chronic myeloid leukemia, BCR/ABL-positive, not having achieved remission: Secondary | ICD-10-CM | POA: Diagnosis not present

## 2023-10-24 DIAGNOSIS — Z79899 Other long term (current) drug therapy: Secondary | ICD-10-CM | POA: Diagnosis not present

## 2023-10-24 MED ORDER — APIXABAN 5 MG PO TABS: 5.0000 mg | ORAL_TABLET | Freq: Two times a day (BID) | ORAL | 6 refills | Status: DC

## 2023-10-24 MED ORDER — FUROSEMIDE 20 MG PO TABS: 10.0000 mg | ORAL_TABLET | ORAL | 1 refills | Status: DC

## 2023-10-24 NOTE — Telephone Encounter (Signed)
-----   Message from [place] sent at 10/24/2023  4:33 PM EDT ----- Regarding: FW: Treatment Recommendations Please call patient and let him know to increased Eliquis  to 5 mg bid. Needs repeat CBC and BMP in 1 wk.   Per Dr. Mitzie Anda get echo in 4-6 wks.  Thanks ----- Message ----- From: Darlis Eisenmenger, MD Sent: 10/24/2023   3:30 PM EDT To: Brittainy Tedd Favorite, PA-C Subject: RE: Treatment Recommendations                  4-6 week echo is ok.  Can increase Eliquis  with repeat CBC and BMET in the next week or so. ----- Message ----- From: Horace Lye, PA-C Sent: 10/24/2023   2:37 PM EDT To: Darlis Eisenmenger, MD Subject: Treatment Recommendations                      I saw pt in clinic today post DCCV. He is maintaining NSR.   He has been on Eliquis  2.5 mg bid given age and baseline SCr > 1.5, however he had labs done at the cancer center yesterday and SCr was 1.19. He historically has been in the 1.6-1.8 range. He also just started chemo for CML w/ asciminib which causes thrombocytopenia and potentially cardiotoxic label warning. His plts dropped from 122>85K.  1). Should I increase Eliquis  to 5 mg bid w/ close repeat CBC next wk or keep at 2.5 given bleed risk?   2). How soon after starting chemo do you want repeat echo? He just had an echo back in 1/25 before CML diagnosis was made. I had schedulers make for 4-6 wks but can get sooner if needed   I also tried getting a f/u w/ you w/ echo but nothing available so we had to place on APP schedule

## 2023-10-24 NOTE — Progress Notes (Signed)
 ReDS Vest / Clip - 10/24/23 1500       ReDS Vest / Clip   Station Marker C    Ruler Value 30.5    ReDS Value Range Moderate volume overload    ReDS Actual Value 40

## 2023-10-24 NOTE — Telephone Encounter (Signed)
 Spoke w/pt and wife, they are aware, agreeable, and verbalized understanding. New Eliquis  RX sent in, lab orders placed pt will have done at [facility] next week, echo sch for 7/18

## 2023-10-24 NOTE — Patient Instructions (Addendum)
 Good to see you today!  INCREASE to Lasix  10 mg  daily X 3 days then back to every other day  Take potassium with the lasix  dose  Your physician recommends that you schedule a follow-up appointment : As scheduled   If you have any questions or concerns before your next appointment please send us  a message through [place] or call our office at [phone].    TO LEAVE A MESSAGE FOR THE NURSE SELECT OPTION 2, PLEASE LEAVE A MESSAGE INCLUDING: YOUR NAME DATE OF BIRTH CALL BACK NUMBER REASON FOR CALL**this is important as we prioritize the call backs  YOU WILL RECEIVE A CALL BACK THE SAME DAY AS LONG AS YOU CALL BEFORE 4:00 PM At the [facility], you and your health needs are our priority. As part of our continuing mission to provide you with exceptional heart care, we have created designated Provider Care Teams. These Care Teams include your primary Cardiologist (physician) and Advanced Practice Providers (APPs- Physician Assistants and Nurse Practitioners) who all work together to provide you with the care you need, when you need it.   You may see any of the following providers on your designated Care Team at your next follow up: Dr Jules Oar Dr [name] Dr. Alwin Baars Dr. Arta Lark Amy Marijane Shoulders, NP Ruddy Corral, [facility] [place], [place] Dennise Fitz, NP [place] Lee, NP Shawnee Dellen, NP Luster Salters, PharmD Bevely Brush, PharmD   Please be sure to bring in all your medications bottles to every appointment.    Thank you for choosing [facility]

## 2023-10-24 NOTE — Addendum Note (Signed)
 Encounter addended by: Dewey Fordyce, CMA on: 10/24/2023 4:01 PM  Actions taken: Visit diagnoses modified, Order list changed, Diagnosis association updated, Flowsheet accepted, Clinical Note Signed, Charge Capture section accepted

## 2023-10-29 ENCOUNTER — Other Ambulatory Visit: Payer: Self-pay

## 2023-10-29 ENCOUNTER — Other Ambulatory Visit (HOSPITAL_COMMUNITY): Payer: Self-pay

## 2023-10-29 NOTE — Progress Notes (Signed)
 Specialty Pharmacy Refill Coordination Note  Alexander Burton is a 83 y.o. male contacted today regarding refills of specialty medication(s) Asciminib HCl (SCEMBLIX )   Patient requested Delivery   Delivery date: 11/06/23   Verified address: [address] [name] [address]   Medication will be filled on 11/05/23.

## 2023-10-29 NOTE — Progress Notes (Signed)
 Specialty Pharmacy Ongoing Clinical Assessment Note  GID SCHOFFSTALL is a 83 y.o. male who is being followed by the specialty pharmacy service for RxSp Oncology   Patient's specialty medication(s) reviewed today: Asciminib HCl (SCEMBLIX )   Missed doses in the last 4 weeks: 0   Patient/Caregiver asked additional questions regarding see intervention notes below.  Therapeutic benefit summary: Unable to assess   Adverse events/side effects summary: Experienced adverse events/side effects (loss of appetite, nausea (no antiemetic on hand), and increased bleeding when trama occurs (likely influenced by the Eliquis ))   Patient's therapy is appropriate to: Continue    Goals Addressed             This Visit's Progress    Achieve or maintain remission   No change    Patient is initiating therapy. Patient will maintain adherence         Follow up: 3 months  Malachi Screws Specialty Pharmacist   Clinical Intervention Note  Clinical Intervention Notes: I spoke with the patient's wife, she reports that he has upcoming dental work, we discussed that some providers do recommend holding Scemblix  during dental work and I advised her to discuss with his provider at their upcoming appt on 6/05.  She also reported loss of appetite and nausea, I recommended that they request and antiemetic from the provider at the upcoming appointment as well as consider seeing a nutritionist for his appetite.  She was understanding. Patient also complained of more bleeding when trama occurs, but he is also on Eliquis , which his cardiologist already recommended holding for his dental procedure.  All questions were answered and patient's wife has a list of what to discuss at Thursday's appointment with their oncologist.   Clinical Intervention Outcomes: Prevention of an adverse drug event   Malachi Screws Specialty Pharmacist

## 2023-10-31 ENCOUNTER — Ambulatory Visit: Admitting: Oncology

## 2023-10-31 ENCOUNTER — Inpatient Hospital Stay

## 2023-10-31 ENCOUNTER — Inpatient Hospital Stay: Attending: Oncology | Admitting: Oncology

## 2023-10-31 VITALS — BP 88/63 | HR 61 | Temp 97.7°F | Resp 18 | Wt 169.4 lb

## 2023-10-31 DIAGNOSIS — D649 Anemia, unspecified: Secondary | ICD-10-CM | POA: Insufficient documentation

## 2023-10-31 DIAGNOSIS — D472 Monoclonal gammopathy: Secondary | ICD-10-CM | POA: Insufficient documentation

## 2023-10-31 DIAGNOSIS — Z79899 Other long term (current) drug therapy: Secondary | ICD-10-CM | POA: Diagnosis not present

## 2023-10-31 DIAGNOSIS — C9210 Chronic myeloid leukemia, BCR/ABL-positive, not having achieved remission: Secondary | ICD-10-CM

## 2023-10-31 NOTE — Progress Notes (Signed)
  [facility]  HEMATOLOGY FOLLOW-UP VISIT  [name], MD  REASON FOR FOLLOW-UP: CML  ASSESSMENT & PLAN:  Patient is a 83 y.o. male following for leukocytosis  CML (chronic myelocytic leukemia) (HCC) Patient has recently diagnosed pH positive CML in chronic phase on bone marrow biopsy done at [facility].  High risk based on Sokol score BCR-ABL.  Quantitative PCR: P210 positive, IS %: 177.85 Flow cytometry:Bone marrow: 94% abnormal granulocytes, 1.1% abnormal monocytes, negative for a discrete population of plasma cells Ultrasound abdomen showed a spleen size of 14 cm ABL kinase mutation testing: Negative  - Patient started on asciminib last month.  Tolerating well. - Recommended patient to obtain a CBC the day before dental procedure and if platelets are low, hold the same day 2 days prior to and 1 day after the procedure due to the risk of bleeding. - BCR-ABL from today's pending  Return to clinic in 1 month with labs   Anemia Previous labs showed iron deficiency anemia. S/p IV iron with now improved iron levels and anemia  -Will repeat labs in a month to assess response  MGUS (monoclonal gammopathy of unknown significance) Patient has a history of MGUS with an M spike of 0.3 in 2021. Recent SPEP with no M spike and slightly elevated free light chains with a normal FLC ratio  - No further workup needed at this time   The total time spent in the appointment was 30 minutes encounter with patients including review of chart and various tests results, discussions about plan of care and coordination of care plan   All questions were answered. The patient knows to call the clinic with any problems, questions or concerns. No barriers to learning was detected.  Eduardo Grade, MD 6/5/20253:16 PM   Orders Placed This Encounter  Procedures   BCR-ABL1, CML/ALL, PCR, QUANT   Ferritin   Folate   Vitamin B12   CBC with  Differential/Platelet   Comprehensive metabolic panel with GFR   Iron and TIBC   Lactate dehydrogenase   Uric acid   BCR-ABL1, CML/ALL, PCR, QUANT    HEMATOLOGY HISTORY: 09/05/2023: Ultrasound abdomen: Spleen size: 14 cm 09/09/2023:  -BCR-ABL.  Quantitative PCR: P210 positive, IS %: 177.85 -Flow cytometry:Bone marrow: 94% abnormal granulocytes, 1.1% abnormal monocytes, negative for a discrete population of plasma cells -Bone marrow biopsy pathology: CML-chronic phase -ABL kinase mutations not detected -JAK2 mutation: Not detected 10/03/2023: Started on asciminib 40 mg twice daily   INTERVAL HISTORY: Alexander Burton 83 y.o. male following for leukocytosis. Patient was accompanied by his wife and daughter today.   He reports that his fatigue has not significantly improved but he has been doing okay.  He has a tooth that is bothering him and would like to get dental procedure done.  Recommended that he just had cardioversion done as per cardiology cannot be off of Eliquis  until next week.  Recommended to plan the procedure after next week and keep in mind that Asciminib can cause drop in platelet counts and increased risk of bleeding.  I have reviewed the past medical history, past surgical history, social history and family history with the patient   ALLERGIES:  is allergic to contrast media [iodinated contrast media] and metrizamide.  MEDICATIONS:  Current Outpatient Medications  Medication Sig Dispense Refill   amiodarone  (PACERONE ) 200 MG tablet Take 1 tablet (200 mg total) by mouth daily. 100 tablet 0   amoxicillin  (AMOXIL ) 500 MG capsule Take  1,000 mg by mouth 2 (two) times daily.     apixaban  (ELIQUIS ) 5 MG TABS tablet Take 1 tablet (5 mg total) by mouth every 12 (twelve) hours. 60 tablet 6   asciminib hcl (SCEMBLIX ) 40 MG tablet Take 1 tablet (40 mg total) by mouth 2 (two) times daily. Take on empty stomach, at least one hour before or two hours after food. 60 tablet 2   aspirin   EC 325 MG tablet Take 325 mg by mouth every 6 (six) hours as needed for moderate pain (pain score 4-6).     finasteride  (PROSCAR ) 5 MG tablet Take 5 mg by mouth in the morning.     furosemide  (LASIX ) 20 MG tablet Take 0.5 tablets (10 mg total) by mouth every other day. (Patient taking differently: Take 20 mg by mouth. Every 3rd day)     furosemide  (LASIX ) 20 MG tablet Take 0.5 tablets (10 mg total) by mouth every other day. 60 tablet 1   JARDIANCE  10 MG TABS tablet TAKE 1 TABLET(10 MG) BY MOUTH DAILY BEFORE BREAKFAST 30 tablet 5   metoprolol  succinate (TOPROL -XL) 25 MG 24 hr tablet Take 1 tablet (25 mg total) by mouth daily. 90 tablet 3   mirtazapine (REMERON) 7.5 MG tablet Take 7.5 mg by mouth at bedtime.     Multiple Vitamin (MULITIVITAMIN WITH MINERALS) TABS Take 1 tablet by mouth in the morning. Centrum Silver men 50+     Multiple Vitamins-Minerals (PRESERVISION AREDS) CAPS Take 1 capsule by mouth in the morning and at bedtime.      nitroGLYCERIN (NITROSTAT) 0.4 MG SL tablet Place 0.4 mg under the tongue every 5 (five) minutes as needed for chest pain.     Oxymetazoline HCl (AFRIN NASAL SPRAY NA) Place 2 sprays into the nose 2 (two) times daily as needed (congestion).     pantoprazole (PROTONIX) 40 MG tablet Take 40 mg by mouth at bedtime.      Polyvinyl Alcohol-Povidone PF (REFRESH) 1.4-0.6 % SOLN Place 1-2 drops into both eyes 3 (three) times daily as needed (dry/irritated eyes).     potassium chloride  (MICRO-K ) 10 MEQ CR capsule Take 10 mEq by mouth See admin instructions. Every 3rd day     pravastatin  (PRAVACHOL ) 20 MG tablet TAKE 1 TABLET(20 MG) BY MOUTH DAILY 90 tablet 2   Probiotic Product (PROBIOTIC DAILY PO) Take 1 tablet by mouth in the morning.     tamsulosin  (FLOMAX ) 0.4 MG CAPS capsule Take 0.4 mg by mouth in the morning and at bedtime.   3   ursodiol  (ACTIGALL ) 250 MG tablet Take 1 tablet (250 mg total) by mouth in the morning and at bedtime. 120 tablet 5   No current  facility-administered medications for this visit.     REVIEW OF SYSTEMS:   Constitutional: Denies fevers, chills or night sweats Eyes: Denies blurriness of vision Ears, nose, mouth, throat, and face: Denies mucositis or sore throat Respiratory: Denies cough, dyspnea or wheezes Cardiovascular: Denies palpitation, chest discomfort or lower extremity swelling Gastrointestinal:  Denies nausea, heartburn or change in bowel habits Skin: Denies abnormal skin rashes Lymphatics: Denies new lymphadenopathy or easy bruising Neurological:Denies numbness, tingling or new weaknesses Behavioral/Psych: Mood is stable, no new changes  All other systems were reviewed with the patient and are negative.  PHYSICAL EXAMINATION:   Vitals:   10/31/23 1314  BP: (!) 88/63  Pulse: 61  Resp: 18  Temp: 97.7 F (36.5 C)  SpO2: 99%     GENERAL:alert, no distress and comfortable  SKIN: skin color, texture, turgor are normal, no rashes or significant lesions LUNGS: clear to auscultation and percussion with normal breathing effort HEART: regular rate & rhythm and no murmurs and no lower extremity edema ABDOMEN:abdomen soft, non-tender and normal bowel sounds Musculoskeletal:no cyanosis of digits and no clubbing  NEURO: alert & oriented x 3 with fluent speech  LABORATORY DATA:  I have reviewed the data as listed and also results reviewed from recent admission at [facility]  Lab Results  Component Value Date   WBC 10.8 (H) 10/23/2023   NEUTROABS 9.4 (H) 10/23/2023   HGB 11.6 (L) 10/23/2023   HCT 38.9 (L) 10/23/2023   MCV 87.4 10/23/2023   PLT 85 (L) 10/23/2023       Chemistry      Component Value Date/Time   NA 139 10/23/2023 1017   NA 142 06/23/2014 1122   K 4.1 10/23/2023 1017   K 4.5 06/23/2014 1122   CL 107 10/23/2023 1017   CL 109 (H) 11/19/2012 1044   CO2 23 10/23/2023 1017   CO2 25 06/23/2014 1122   BUN 19 10/23/2023 1017   BUN 15.2 06/23/2014 1122   CREATININE 1.19 10/23/2023 1017    CREATININE 1.70 (H) 07/29/2023 1129   CREATININE 1.0 06/23/2014 1122      Component Value Date/Time   CALCIUM 8.0 (L) 10/23/2023 1017   CALCIUM 8.4 06/23/2014 1122   ALKPHOS 117 10/23/2023 1017   ALKPHOS 122 06/23/2014 1122   AST 23 10/23/2023 1017   AST 20 06/23/2014 1122   ALT 21 10/23/2023 1017   ALT 17 06/23/2014 1122   BILITOT 1.6 (H) 10/23/2023 1017   BILITOT 1.11 06/23/2014 1122      Latest Reference Range & Units 10/23/23 10:17  Iron 45 - 182 ug/dL 43 (L)  UIBC ug/dL 098  TIBC 119 - 147 ug/dL 829  Saturation Ratios 17.9 - 39.5 % 16 (L)  Ferritin 24 - 336 ng/mL 259  Folate >5.9 ng/mL 20.7  Vitamin B12 180 - 914 pg/mL 1,712 (H)  (L): Data is abnormally low (H): Data is abnormally high  Latest Reference Range & Units 09/05/23 11:58 09/05/23 11:59  Iron 45 - 182 ug/dL  27 (L)  Total Protein ELP 6.0 - 8.5 g/dL  5.9 (L) (C)  Albumin SerPl Elph-Mcnc 2.9 - 4.4 g/dL  3.0 (C)  Albumin/Glob SerPl 0.7 - 1.7   1.1 (C)  Alpha2 Glob SerPl Elph-Mcnc 0.4 - 1.0 g/dL  0.9 (C)  Alpha 1 0.0 - 0.4 g/dL  0.4 (C)  Gamma Glob SerPl Elph-Mcnc 0.4 - 1.8 g/dL  0.6 (C)  M Protein SerPl Elph-Mcnc Not Observed g/dL  Not Observed (C)  IFE 1   Comment (C)  Globulin, Total 2.2 - 3.9 g/dL  2.9 (C)  B-Globulin SerPl Elph-Mcnc 0.7 - 1.3 g/dL  1.0 (C)  IgG (Immunoglobin G), Serum 603 - 1,613 mg/dL  562  IgM (Immunoglobulin M), Srm 15 - 143 mg/dL  130  IgA 61 - 865 mg/dL  784  (L): Data is abnormally low (H): Data is abnormally high (C): Corrected   Latest Reference Range & Units 09/05/23 11:58  Kappa free light chain 3.3 - 19.4 mg/L 38.8 (H)  Lambda free light chains 5.7 - 26.3 mg/L 45.1 (H)  Kappa, lambda light chain ratio 0.26 - 1.65  0.86  (H): Data is abnormally high   Latest Reference Range & Units 10/03/23 10:01  b2a2 transcript % 20.5240  b3a2 transcript % 19.2253  E1A2 Transcript % 0.0250  Interpretation [facility]):  Positive !  !: Data is abnormal (C): Corrected Rpt: View report in  Results Review for more information  BCR-ABL.  Quantitative PCR: P210 positive, IS %: 177.85 Flow cytometry:Bone marrow: 94% abnormal granulocytes, 1.1% abnormal monocytes, negative for a discrete population of plasma cells

## 2023-10-31 NOTE — Assessment & Plan Note (Signed)
 Patient has a history of MGUS with an M spike of 0.3 in 2021. Recent SPEP with no M spike and slightly elevated free light chains with a normal FLC ratio  - No further workup needed at this time

## 2023-10-31 NOTE — Assessment & Plan Note (Signed)
 Patient has recently diagnosed pH positive CML in chronic phase on bone marrow biopsy done at [facility].  High risk based on Sokol score BCR-ABL.  Quantitative PCR: P210 positive, IS %: 177.85 Flow cytometry:Bone marrow: 94% abnormal granulocytes, 1.1% abnormal monocytes, negative for a discrete population of plasma cells Ultrasound abdomen showed a spleen size of 14 cm ABL kinase mutation testing: Negative  - Patient started on asciminib last month.  Tolerating well. - Recommended patient to obtain a CBC the day before dental procedure and if platelets are low, hold the same day 2 days prior to and 1 day after the procedure due to the risk of bleeding. - BCR-ABL from today's pending  Return to clinic in 1 month with labs

## 2023-10-31 NOTE — Assessment & Plan Note (Signed)
 Previous labs showed iron deficiency anemia. S/p IV iron with now improved iron levels and anemia  -Will repeat labs in a month to assess response

## 2023-10-31 NOTE — Patient Instructions (Signed)
 VISIT SUMMARY:  During your visit, we discussed your concerns about managing your medications and the upcoming dental procedure. We reviewed your current treatment for chronic myeloid leukemia (CML) and the necessary steps to ensure your safety during the dental extraction. We also addressed your symptoms of fatigue and shortness of breath, and the recent iron infusion you received.  YOUR PLAN:  -CHRONIC MYELOID LEUKEMIA (CML): Chronic myeloid leukemia is a type of cancer that affects the blood and bone marrow. We need to perform a BCR-ABL blood test to evaluate the effectiveness of your current medication, Scemblix . It is important to continue taking Scemblix  as prescribed to prevent resistance.  -THROMBOCYTOPENIA DUE TO MEDICATION: Thrombocytopenia means you have a lower than normal number of platelets, which can increase your risk of bleeding. This condition is caused by your CML medication, Scemblix . We will check your platelet levels before any dental procedures and pause Scemblix  two days before and one day after the procedure to reduce bleeding risk.  -FATIGUE AND SHORTNESS OF BREATH: Your fatigue and shortness of breath are likely related to your CML and anemia. We have planned another iron infusion next month to help improve these symptoms.  -DENTAL ISSUES REQUIRING EXTRACTION: You need a dental extraction due to an infection risk from a bad tooth. However, because of your low platelet levels and use of blood thinners, we need to carefully plan the timing of this procedure. We will schedule the extraction after November 08, 2023, and ensure your platelet levels are checked the day before. We will also coordinate with your dental surgeon for any necessary pre-procedure labs.  -USE OF ANTICOAGULANT THERAPY (ELIQUIS ): You are taking Eliquis , a blood thinner, which increases your risk of bleeding during procedures. We will consult with your cardiologist to determine the safest time to pause this  medication before your dental extraction.  INSTRUCTIONS:  Please follow up with the BCR-ABL blood test as ordered. Continue taking Scemblix  as prescribed. We will perform a CBC before your dental procedure and hold Scemblix  two days before and one day after the procedure. Your next iron infusion is scheduled for next month. We will schedule your dental extraction after November 08, 2023, and ensure your platelet levels are checked the day before. Please consult with your cardiologist regarding the timing to hold Eliquis  before the dental procedure.

## 2023-11-01 ENCOUNTER — Telehealth (HOSPITAL_COMMUNITY): Payer: Self-pay

## 2023-11-01 DIAGNOSIS — I5022 Chronic systolic (congestive) heart failure: Secondary | ICD-10-CM

## 2023-11-01 NOTE — Telephone Encounter (Signed)
 Patients wife reports that she was unable to have patients BNP drawn- would like to come to office next week. This has been ordered and scheduled. Advised her to call back with concerns and she verbalized understanding.

## 2023-11-05 ENCOUNTER — Other Ambulatory Visit (HOSPITAL_COMMUNITY)

## 2023-11-05 DIAGNOSIS — R630 Anorexia: Secondary | ICD-10-CM | POA: Diagnosis not present

## 2023-11-05 DIAGNOSIS — E782 Mixed hyperlipidemia: Secondary | ICD-10-CM | POA: Diagnosis not present

## 2023-11-05 DIAGNOSIS — D649 Anemia, unspecified: Secondary | ICD-10-CM | POA: Diagnosis not present

## 2023-11-05 DIAGNOSIS — I480 Paroxysmal atrial fibrillation: Secondary | ICD-10-CM | POA: Diagnosis not present

## 2023-11-05 DIAGNOSIS — K219 Gastro-esophageal reflux disease without esophagitis: Secondary | ICD-10-CM | POA: Diagnosis not present

## 2023-11-05 DIAGNOSIS — C9210 Chronic myeloid leukemia, BCR/ABL-positive, not having achieved remission: Secondary | ICD-10-CM | POA: Diagnosis not present

## 2023-11-05 DIAGNOSIS — N401 Enlarged prostate with lower urinary tract symptoms: Secondary | ICD-10-CM | POA: Diagnosis not present

## 2023-11-05 DIAGNOSIS — K8309 Other cholangitis: Secondary | ICD-10-CM | POA: Diagnosis not present

## 2023-11-05 DIAGNOSIS — I5022 Chronic systolic (congestive) heart failure: Secondary | ICD-10-CM | POA: Diagnosis not present

## 2023-11-05 DIAGNOSIS — R609 Edema, unspecified: Secondary | ICD-10-CM | POA: Diagnosis not present

## 2023-11-05 DIAGNOSIS — I10 Essential (primary) hypertension: Secondary | ICD-10-CM | POA: Diagnosis not present

## 2023-11-06 LAB — BCR-ABL1, CML/ALL, PCR, QUANT
E1A2 Transcript: 0.0416 %
Interpretation (BCRAL):: POSITIVE — AB
b2a2 transcript: 16.6221 %
b3a2 transcript: 25.5651 %

## 2023-11-12 ENCOUNTER — Telehealth (HOSPITAL_COMMUNITY): Payer: Self-pay

## 2023-11-12 ENCOUNTER — Ambulatory Visit (HOSPITAL_COMMUNITY)
Admission: RE | Admit: 2023-11-12 | Discharge: 2023-11-12 | Disposition: A | Discharge status: Home / Self Care | Source: Ambulatory Visit | Attending: Cardiology | Admitting: Cardiology

## 2023-11-12 DIAGNOSIS — I5022 Chronic systolic (congestive) heart failure: Secondary | ICD-10-CM | POA: Diagnosis not present

## 2023-11-12 LAB — BRAIN NATRIURETIC PEPTIDE: B Natriuretic Peptide: 187.4 pg/mL — ABNORMAL HIGH (ref 0.0–100.0)

## 2023-11-12 NOTE — Telephone Encounter (Signed)
  [facility]   Pre-operative Risk Assessment    Request for Surgical Clearance{  Procedure:  Dental Extraction - Amount of Teeth to be Pulled:  2 { Date of Surgery:  Clearance TBD                               { Surgeon:  Dr. Alcario Human  Surgeon's Group or Practice Name:  [facility]  Phone number:  [phone] Fax number:  [phone] { Type of Clearance Requested:   - Medical  - Pharmacy:  Hold Apixaban  (Eliquis ) how long should he hold  { Type of Anesthesia:  not listed  { Signed, Daria Eddy B Addie Alonge   11/12/2023, 10:32 AM   [facility]

## 2023-11-14 NOTE — Telephone Encounter (Signed)
 Copy of clearance with provider recommendations faxed to requesting office via Epic fax function.     Ruddy Corral M, PA-C to Me (Selected Message)     11/12/23 12:15 PM Ok for dental extractions. Can hold Eliquis  48 hrs prior to procedure, then resume as soon as safe to do so.

## 2023-11-20 ENCOUNTER — Telehealth (HOSPITAL_COMMUNITY): Payer: Self-pay

## 2023-11-20 NOTE — Telephone Encounter (Signed)
 Called and spoke to pt's wife Nichole to confirm/remind patient of their appointment at the [facility] on 11/21/23.   Appointment:   [x] Confirmed  [] Left mess   [] No answer/No voice mail  [] VM Full/unable to leave message  [] Phone not in service  Patient reminded to bring all medications and/or complete list.  Confirmed patient has transportation. Gave directions, instructed to utilize valet parking.

## 2023-11-20 NOTE — Progress Notes (Signed)
 ADVANCED HF CLINIC NOTE  PCP: Shona Norleen PEDLAR, MD Cardiology: Dr. Debera HF Cardiology: Dr. Rolan Reason for Visit: Heart Failure Follow-up  HPI: 83 y.o. with history of persistent atrial fibrillation, CAD, and CHF. He had a DES placed in the LAD in 2/10.  He has a hepaticojejunostomy post-complicated cholecystectomy with common bile duct rupture and chronic hepatic abscess. He has had recurrent cholangitis and recurrent biliary strictures.  In 2023, he reported worsening dyspnea for several months. Echo in 11/23 showed EF 40-45%, mild LV dilation, mild LVH, mildly decreased RV systolic function, mild MR, mild-moderate TR. He was noted to be in atrial fibrillation since 11/23.  He had not been anticoagulated in the past due to low platelets though recently they have been running in the 80s-100s. He has a history of orthostatic hypotension.    Echo in 5/24 showed EF 35%, global hypokinesis, mild LVH, mild RV enlargement and mildly decreased systolic function, severe biatrial enlargement.   S/p DCCV to NSR in 6/24.    Underwent routine biliary tube exchange at [facility] and had later admitted for SBO in 8/24. Treated with bowel rest and NGT.  Echo 1/25 showed EF improved at 45-50%.  Zio 4/25 showed 100% AF/AFL burden.  Admitted 4/25 at [facility] with CAP. Underwent diagnostic thoracentesis showing exudative effusion with lymphocytic predominance. Hematology consulted, underwent BMBx for further evaluation for hematologic malignancy. Noted to be in AFL during admission.  Discharged home off spiro due to AKI.  He had hospital f/u in our clinic 09/27/23 and reported worsening dyspnea since being back in AFL. He was noted to be euvolemic on exam and by ReDs, 26%. He was subsequently set up for outpatient DCCV. This was done on 5/13. He converted back to NSR after 1 attempt.   Also of note, he is following with Hem/Onc. Recent BMBx showed chronic myelogenous leukemia (CML), BCR::ABL1-positive and Plasma  cell dyscrasia, ~5% plasma cells on core biopsy with slight lambda predominance. He just started chemo 2 wks ago. He is being treated w/ asciminib (potentially cardiotoxic label warning). He has f/u w/ Dr. Davonna.  He returns today for heart failure follow up with his wife. Overall feeling worn. NYHA III. Reports dyspnea, fatigue, and orthopnea. Denies chest pain, near-syncope, palpitations, dizziness, and abnormal bleeding. Has been feeling more run down since starting chemotherapy. Appetite down. Needs to have dental work done. Compliant with all medications.  PMH: 1. CAD: DES to LAD in 2/10.  - Cardiolite  (5/23): EF 48%, no ischemia/infarction.  2. COPD 3. BPH 4. Hyperlipidemia 5. Melanoma 6. Atrial fibrillation: Persistent - DCCV to NSR in 6/24  7. Chronic thrombocytopenia 8. OSA 9. Chronic dysphagia 10. Biliary system disease: Complicated cholecystectomy with rupture of common bile duct, chronic hepatic abscess after this.   - Hepaticojejunostomy with recurrent cholangitis.  - Recurrent biliary stricture requiring dilations.  11. Chronic HF with mid range EF: Echo (2/18) with EF 55-60%.  Echo (11/23) with EF 40-45%, mild LV dilation, mild LVH, mildly decreased RV systolic function, mild MR, mild-moderate TR.  - Echo (5/24): EF 35%, global hypokinesis, mild LVH, mild RV enlargement and mildly decreased systolic function, severe biatrial enlargement.  - Echo (1/25): EF 45-50%, normal RV  SH: Married, [place], nonsmoker, no ETOH.   Family History  Problem Relation Age of Onset   Colon cancer Sister    Heart disease Mother    Emphysema Father    ROS: All systems reviewed and negative except as per HPI.  Current Outpatient Medications  Medication Sig Dispense Refill   amiodarone  (PACERONE ) 200 MG tablet Take 1 tablet (200 mg total) by mouth daily. 100 tablet 0   amoxicillin  (AMOXIL ) 500 MG capsule Take 1,000 mg by mouth 2 (two) times daily.     apixaban   (ELIQUIS ) 5 MG TABS tablet Take 1 tablet (5 mg total) by mouth every 12 (twelve) hours. 60 tablet 6   asciminib hcl (SCEMBLIX ) 40 MG tablet Take 1 tablet (40 mg total) by mouth 2 (two) times daily. Take on empty stomach, at least one hour before or two hours after food. 60 tablet 2   aspirin  EC 325 MG tablet Take 325 mg by mouth every 6 (six) hours as needed for moderate pain (pain score 4-6).     finasteride  (PROSCAR ) 5 MG tablet Take 5 mg by mouth in the morning.     furosemide  (LASIX ) 20 MG tablet Take 0.5 tablets (10 mg total) by mouth every other day. (Patient taking differently: Take 20 mg by mouth. Every 3rd day)     furosemide  (LASIX ) 20 MG tablet Take 0.5 tablets (10 mg total) by mouth every other day. 60 tablet 1   JARDIANCE  10 MG TABS tablet TAKE 1 TABLET(10 MG) BY MOUTH DAILY BEFORE BREAKFAST 30 tablet 5   metoprolol  succinate (TOPROL -XL) 25 MG 24 hr tablet Take 1 tablet (25 mg total) by mouth daily. 90 tablet 3   mirtazapine (REMERON) 7.5 MG tablet Take 7.5 mg by mouth at bedtime.     Multiple Vitamin (MULITIVITAMIN WITH MINERALS) TABS Take 1 tablet by mouth in the morning. Centrum Silver men 50+     Multiple Vitamins-Minerals (PRESERVISION AREDS) CAPS Take 1 capsule by mouth in the morning and at bedtime.      nitroGLYCERIN (NITROSTAT) 0.4 MG SL tablet Place 0.4 mg under the tongue every 5 (five) minutes as needed for chest pain.     Oxymetazoline HCl (AFRIN NASAL SPRAY NA) Place 2 sprays into the nose 2 (two) times daily as needed (congestion).     pantoprazole (PROTONIX) 40 MG tablet Take 40 mg by mouth at bedtime.      Polyvinyl Alcohol-Povidone PF (REFRESH) 1.4-0.6 % SOLN Place 1-2 drops into both eyes 3 (three) times daily as needed (dry/irritated eyes).     potassium chloride  (MICRO-K ) 10 MEQ CR capsule Take 10 mEq by mouth See admin instructions. Every 3rd day     pravastatin  (PRAVACHOL ) 20 MG tablet TAKE 1 TABLET(20 MG) BY MOUTH DAILY 90 tablet 2   Probiotic Product (PROBIOTIC  DAILY PO) Take 1 tablet by mouth in the morning.     tamsulosin  (FLOMAX ) 0.4 MG CAPS capsule Take 0.4 mg by mouth in the morning and at bedtime.   3   ursodiol  (ACTIGALL ) 250 MG tablet Take 1 tablet (250 mg total) by mouth in the morning and at bedtime. 120 tablet 5   No current facility-administered medications for this encounter.   Wt Readings from Last 3 Encounters:  11/21/23 74.4 kg (164 lb)  10/31/23 76.8 kg (169 lb 6.4 oz)  10/24/23 79.7 kg (175 lb 9.6 oz)   BP 102/62   Pulse 60   Ht 5' 10 (1.778 m)   Wt 74.4 kg (164 lb)   SpO2 98%   BMI 23.53 kg/m   PHYSICAL EXAM: General: Elderly appearing. No distress on RA Cardiac: JVP ~9cm. S1 and S2 present. No murmurs or rub. Extremities: Warm and dry.  No peripheral edema. + compression socks Neuro: Alert and  oriented x3. Affect pleasant. Moves all extremities without difficulty.  ECG (personally reviewed): NSR 67 bpm with a PVC  ReDs reading: 39%, abnormal  Assessment/Plan: 1. Chronic HF with mid-range EF: Echo (11/23) with EF 40-45%, mild LV dilation, mild LVH, mildly decreased RV systolic function, mild MR, mild-moderate TR.  Cardiolite  in 5/23 with no ischemia or infarction.  Echo in 5/24 with EF 35%, global hypokinesis, mild LVH, mild RV enlargement and mildly decreased systolic function, severe biatrial enlargement.  The onset of atrial fibrillation may have triggered CHF worsening, now s/p DCCV to NSR in 6/24, required repeat DCCV 5/25 for recurrent Afib, now back in NSR.  He has had no chest pain. Echo (1/25): EF 45-50%, normal RV.  - NYHA Class III. Worse since starting chemotherapy. Volume up by symptoms and ReDs clip.  - Restart Lasix  10 mg EOD. Stopped the Lasix  because swelling in legs went down with compression socks. - Continue Jardiance  10 mg daily.  - Continue Toprol  XL 25 mg daily.  - Repeat BMET today, if renal function remains <1.5, resume Spiro. - BP remains stable off Midodrine  - given chemo treatment w/  asciminib (potentially cardiotoxic label warning), will need to follow serial echocardiograms w/ strain imaging. Scheduled for 7/18. 2. Atrial fibrillation: S/p DCCV in 6/24. Had been in NSR until ~ 08/2023. Zio 4/25 showed 100% AF/AFL burden. Underwent repeat DCCV on 5/25 back to NSR. EKG in NSR at last OPV post-cardioversion.  - Continue Toprol  XL 25 mg daily.  - Continue Eliquis  5 mg bid. CTM Cr, if rises >1.5 will need to drop dose. Check CBC today. - Continue amiodarone  200 mg daily. QTc ok on today's EKG at (asciminib carries potential risk of QT prolongation).  - CMET today 3. Recurrent biliary stricture with hepaticojejunostomy: Biliary stent has been removed.  4. CAD: DES to LAD in 2/10.  No chest pain.  Cardiolite  in 5/23 showed no ischemia. EF was lower on last echo but this may be related to AF, will hold off on cath for now with no chest pain. Echo 1/25 showed EF improved to 45-50% with restoration of NSR. He denies CP  - Continue pravastatin , lipids at goal 3/25. LDL goal < 55  5. Leukocytosis: Following with Heme/Onc. Results of BMBx demonstrated Chronic myelogenous leukemia (CML), BCR::ABL1-positive + Plasma cell dyscrasia, ~5% plasma cells on core biopsy with slight lambda predominance. - followed by hem/onc just started chemotherapy.  He is being treated w/ asciminib (potentially cardiotoxic label warning). Will need to follow serial echocardiograms w/ strain imaging  6. Dental Carries: wife reports pt has 2 teeth that need to be extracted. Ok to come off Eliquis  for 3 days prior to teeth removal from HF standpoint (>4wk out from DCCV), however he would need to get clearance from heme/onc given degree of thrombocytopenia and bleed risk   Follow up in 4 weeks with APP + echo (volume and echo results)  [place] Rutherford Alarie, NP 11/21/2023

## 2023-11-21 ENCOUNTER — Encounter (HOSPITAL_COMMUNITY): Payer: Self-pay

## 2023-11-21 ENCOUNTER — Ambulatory Visit (HOSPITAL_COMMUNITY)
Admission: RE | Admit: 2023-11-21 | Discharge: 2023-11-21 | Disposition: A | Discharge status: Home / Self Care | Source: Ambulatory Visit | Attending: Cardiology | Admitting: Cardiology

## 2023-11-21 ENCOUNTER — Ambulatory Visit (HOSPITAL_COMMUNITY): Payer: Self-pay | Admitting: Cardiology

## 2023-11-21 VITALS — BP 102/62 | HR 60 | Ht 70.0 in | Wt 164.0 lb

## 2023-11-21 DIAGNOSIS — I5022 Chronic systolic (congestive) heart failure: Secondary | ICD-10-CM | POA: Insufficient documentation

## 2023-11-21 DIAGNOSIS — Z9049 Acquired absence of other specified parts of digestive tract: Secondary | ICD-10-CM | POA: Insufficient documentation

## 2023-11-21 DIAGNOSIS — I4819 Other persistent atrial fibrillation: Secondary | ICD-10-CM | POA: Insufficient documentation

## 2023-11-21 DIAGNOSIS — I480 Paroxysmal atrial fibrillation: Secondary | ICD-10-CM

## 2023-11-21 DIAGNOSIS — K750 Abscess of liver: Secondary | ICD-10-CM | POA: Insufficient documentation

## 2023-11-21 DIAGNOSIS — K029 Dental caries, unspecified: Secondary | ICD-10-CM | POA: Insufficient documentation

## 2023-11-21 DIAGNOSIS — Z934 Other artificial openings of gastrointestinal tract status: Secondary | ICD-10-CM | POA: Insufficient documentation

## 2023-11-21 DIAGNOSIS — I2510 Atherosclerotic heart disease of native coronary artery without angina pectoris: Secondary | ICD-10-CM | POA: Diagnosis not present

## 2023-11-21 DIAGNOSIS — D77 Other disorders of blood and blood-forming organs in diseases classified elsewhere: Secondary | ICD-10-CM | POA: Insufficient documentation

## 2023-11-21 DIAGNOSIS — Z7901 Long term (current) use of anticoagulants: Secondary | ICD-10-CM | POA: Diagnosis not present

## 2023-11-21 DIAGNOSIS — Z955 Presence of coronary angioplasty implant and graft: Secondary | ICD-10-CM | POA: Diagnosis not present

## 2023-11-21 DIAGNOSIS — C9210 Chronic myeloid leukemia, BCR/ABL-positive, not having achieved remission: Secondary | ICD-10-CM | POA: Insufficient documentation

## 2023-11-21 DIAGNOSIS — I493 Ventricular premature depolarization: Secondary | ICD-10-CM | POA: Diagnosis not present

## 2023-11-21 DIAGNOSIS — Z7969 Long term (current) use of other immunomodulators and immunosuppressants: Secondary | ICD-10-CM | POA: Insufficient documentation

## 2023-11-21 DIAGNOSIS — D696 Thrombocytopenia, unspecified: Secondary | ICD-10-CM | POA: Diagnosis not present

## 2023-11-21 DIAGNOSIS — K915 Postcholecystectomy syndrome: Secondary | ICD-10-CM | POA: Insufficient documentation

## 2023-11-21 DIAGNOSIS — Z79899 Other long term (current) drug therapy: Secondary | ICD-10-CM | POA: Diagnosis not present

## 2023-11-21 LAB — COMPREHENSIVE METABOLIC PANEL WITH GFR
ALT: 15 U/L (ref 0–44)
AST: 16 U/L (ref 15–41)
Albumin: 2.9 g/dL — ABNORMAL LOW (ref 3.5–5.0)
Alkaline Phosphatase: 95 U/L (ref 38–126)
Anion gap: 10 (ref 5–15)
BUN: 19 mg/dL (ref 8–23)
CO2: 19 mmol/L — ABNORMAL LOW (ref 22–32)
Calcium: 8 mg/dL — ABNORMAL LOW (ref 8.9–10.3)
Chloride: 109 mmol/L (ref 98–111)
Creatinine, Ser: 1.47 mg/dL — ABNORMAL HIGH (ref 0.61–1.24)
GFR, Estimated: 46 mL/min — ABNORMAL LOW (ref >60)
Glucose, Bld: 108 mg/dL — ABNORMAL HIGH (ref 70–99)
Potassium: 4.2 mmol/L (ref 3.5–5.1)
Sodium: 138 mmol/L (ref 135–145)
Total Bilirubin: 0.9 mg/dL (ref 0.0–1.2)
Total Protein: 5.8 g/dL — ABNORMAL LOW (ref 6.5–8.1)

## 2023-11-21 LAB — CBC
HCT: 35 % — ABNORMAL LOW (ref 39.0–52.0)
Hemoglobin: 11.1 g/dL — ABNORMAL LOW (ref 13.0–17.0)
MCH: 28.4 pg (ref 26.0–34.0)
MCHC: 31.7 g/dL (ref 30.0–36.0)
MCV: 89.5 fL (ref 80.0–100.0)
Platelets: 76 10*3/uL — ABNORMAL LOW (ref 150–400)
RBC: 3.91 MIL/uL — ABNORMAL LOW (ref 4.22–5.81)
RDW: 24.4 % — ABNORMAL HIGH (ref 11.5–15.5)
WBC: 3.2 10*3/uL — ABNORMAL LOW (ref 4.0–10.5)
nRBC: 0 % (ref 0.0–0.2)

## 2023-11-21 LAB — BRAIN NATRIURETIC PEPTIDE: B Natriuretic Peptide: 286.6 pg/mL — ABNORMAL HIGH (ref 0.0–100.0)

## 2023-11-21 MED ORDER — FUROSEMIDE 20 MG PO TABS: 10.0000 mg | ORAL_TABLET | ORAL | 3 refills | Status: DC

## 2023-11-21 NOTE — Progress Notes (Signed)
 ReDS Vest / Clip - 11/21/23 1400       ReDS Vest / Clip   Station Marker C    Ruler Value 32    ReDS Value Range Moderate volume overload    ReDS Actual Value 39

## 2023-11-21 NOTE — Patient Instructions (Addendum)
 Thank you for coming in today  If you had labs drawn today, any labs that are abnormal the clinic will call you No news is good news  Medications: Restart Lasix  10 mg 1/2 tablet every other day  Follow up appointments:  Your physician recommends that you schedule a follow-up appointment in:  July 18th 2025 at 3pm in clinic   Do the following things EVERYDAY: Weigh yourself in the morning before breakfast. Write it down and keep it in a log. Take your medicines as prescribed Eat low salt foods--Limit salt (sodium) to 2000 mg per day.  Stay as active as you can everyday Limit all fluids for the day to less than 2 liters   At the [facility], you and your health needs are our priority. As part of our continuing mission to provide you with exceptional heart care, we have created designated Provider Care Teams. These Care Teams include your primary Cardiologist (physician) and Advanced Practice Providers (APPs- Physician Assistants and Nurse Practitioners) who all work together to provide you with the care you need, when you need it.   You may see any of the following providers on your designated Care Team at your next follow up: Dr Toribio Fuel Dr Ezra Shuck Dr. Ria Gardenia Greig Lenetta, NP Caffie Shed, [place] [facility] [place], [place] Beckey Coe, NP Tinnie Redman, PharmD   Please be sure to bring in all your medications bottles to every appointment.    Thank you for choosing [facility]  If you have any questions or concerns before your next appointment please send us  a message through [place] [place] or call our office at [phone].    TO LEAVE A MESSAGE FOR THE NURSE SELECT OPTION 2, PLEASE LEAVE A MESSAGE INCLUDING: YOUR NAME DATE OF BIRTH CALL BACK NUMBER REASON FOR CALL**this is important as we prioritize the call backs  YOU WILL RECEIVE A CALL BACK THE SAME DAY AS LONG AS YOU CALL BEFORE 4:00  PM

## 2023-11-22 DIAGNOSIS — I10 Essential (primary) hypertension: Secondary | ICD-10-CM | POA: Diagnosis not present

## 2023-11-22 DIAGNOSIS — E782 Mixed hyperlipidemia: Secondary | ICD-10-CM | POA: Diagnosis not present

## 2023-11-22 DIAGNOSIS — D649 Anemia, unspecified: Secondary | ICD-10-CM | POA: Diagnosis not present

## 2023-11-22 DIAGNOSIS — K219 Gastro-esophageal reflux disease without esophagitis: Secondary | ICD-10-CM | POA: Diagnosis not present

## 2023-11-22 DIAGNOSIS — I480 Paroxysmal atrial fibrillation: Secondary | ICD-10-CM | POA: Diagnosis not present

## 2023-11-22 DIAGNOSIS — I5022 Chronic systolic (congestive) heart failure: Secondary | ICD-10-CM | POA: Diagnosis not present

## 2023-11-22 DIAGNOSIS — N401 Enlarged prostate with lower urinary tract symptoms: Secondary | ICD-10-CM | POA: Diagnosis not present

## 2023-11-22 DIAGNOSIS — C9210 Chronic myeloid leukemia, BCR/ABL-positive, not having achieved remission: Secondary | ICD-10-CM | POA: Diagnosis not present

## 2023-11-22 DIAGNOSIS — K8309 Other cholangitis: Secondary | ICD-10-CM | POA: Diagnosis not present

## 2023-11-22 DIAGNOSIS — R630 Anorexia: Secondary | ICD-10-CM | POA: Diagnosis not present

## 2023-11-25 ENCOUNTER — Other Ambulatory Visit: Payer: Self-pay

## 2023-11-25 DIAGNOSIS — I10 Essential (primary) hypertension: Secondary | ICD-10-CM | POA: Diagnosis not present

## 2023-11-25 DIAGNOSIS — I5022 Chronic systolic (congestive) heart failure: Secondary | ICD-10-CM | POA: Diagnosis not present

## 2023-11-25 DIAGNOSIS — N401 Enlarged prostate with lower urinary tract symptoms: Secondary | ICD-10-CM | POA: Diagnosis not present

## 2023-11-25 DIAGNOSIS — I480 Paroxysmal atrial fibrillation: Secondary | ICD-10-CM | POA: Diagnosis not present

## 2023-11-28 ENCOUNTER — Inpatient Hospital Stay: Attending: Oncology

## 2023-11-28 DIAGNOSIS — C9210 Chronic myeloid leukemia, BCR/ABL-positive, not having achieved remission: Secondary | ICD-10-CM | POA: Diagnosis not present

## 2023-11-28 DIAGNOSIS — R634 Abnormal weight loss: Secondary | ICD-10-CM | POA: Insufficient documentation

## 2023-11-28 DIAGNOSIS — D509 Iron deficiency anemia, unspecified: Secondary | ICD-10-CM | POA: Insufficient documentation

## 2023-11-28 LAB — COMPREHENSIVE METABOLIC PANEL WITH GFR
ALT: 20 U/L (ref 0–44)
AST: 20 U/L (ref 15–41)
Albumin: 3 g/dL — ABNORMAL LOW (ref 3.5–5.0)
Alkaline Phosphatase: 121 U/L (ref 38–126)
Anion gap: 8 (ref 5–15)
BUN: 19 mg/dL (ref 8–23)
CO2: 25 mmol/L (ref 22–32)
Calcium: 7.9 mg/dL — ABNORMAL LOW (ref 8.9–10.3)
Chloride: 106 mmol/L (ref 98–111)
Creatinine, Ser: 1.29 mg/dL — ABNORMAL HIGH (ref 0.61–1.24)
GFR, Estimated: 54 mL/min — ABNORMAL LOW (ref >60)
Glucose, Bld: 99 mg/dL (ref 70–99)
Potassium: 3.7 mmol/L (ref 3.5–5.1)
Sodium: 139 mmol/L (ref 135–145)
Total Bilirubin: 0.8 mg/dL (ref 0.0–1.2)
Total Protein: 5.8 g/dL — ABNORMAL LOW (ref 6.5–8.1)

## 2023-11-28 LAB — FOLATE: Folate: 20.7 ng/mL (ref >5.9)

## 2023-11-28 LAB — CBC WITH DIFFERENTIAL/PLATELET
Abs Immature Granulocytes: 0.01 10*3/uL (ref 0.00–0.07)
Basophils Absolute: 0 10*3/uL (ref 0.0–0.1)
Basophils Relative: 1 %
Eosinophils Absolute: 0 10*3/uL (ref 0.0–0.5)
Eosinophils Relative: 1 %
HCT: 37.7 % — ABNORMAL LOW (ref 39.0–52.0)
Hemoglobin: 11.6 g/dL — ABNORMAL LOW (ref 13.0–17.0)
Immature Granulocytes: 0 %
Lymphocytes Relative: 16 %
Lymphs Abs: 0.7 10*3/uL (ref 0.7–4.0)
MCH: 28.2 pg (ref 26.0–34.0)
MCHC: 30.8 g/dL (ref 30.0–36.0)
MCV: 91.7 fL (ref 80.0–100.0)
Monocytes Absolute: 0.2 10*3/uL (ref 0.1–1.0)
Monocytes Relative: 5 %
Neutro Abs: 3.4 10*3/uL (ref 1.7–7.7)
Neutrophils Relative %: 77 %
Platelets: 77 10*3/uL — ABNORMAL LOW (ref 150–400)
RBC: 4.11 MIL/uL — ABNORMAL LOW (ref 4.22–5.81)
RDW: 23.9 % — ABNORMAL HIGH (ref 11.5–15.5)
WBC: 4.3 10*3/uL (ref 4.0–10.5)
nRBC: 0 % (ref 0.0–0.2)

## 2023-11-28 LAB — LACTATE DEHYDROGENASE: LDH: 117 U/L (ref 98–192)

## 2023-11-28 LAB — IRON AND TIBC
Iron: 34 ug/dL — ABNORMAL LOW (ref 45–182)
Saturation Ratios: 15 % — ABNORMAL LOW (ref 17.9–39.5)
TIBC: 228 ug/dL — ABNORMAL LOW (ref 250–450)
UIBC: 194 ug/dL

## 2023-11-28 LAB — VITAMIN B12: Vitamin B-12: 566 pg/mL (ref 180–914)

## 2023-11-28 LAB — URIC ACID: Uric Acid, Serum: 3.1 mg/dL — ABNORMAL LOW (ref 3.7–8.6)

## 2023-11-28 LAB — FERRITIN: Ferritin: 83 ng/mL (ref 24–336)

## 2023-11-29 ENCOUNTER — Other Ambulatory Visit: Payer: Self-pay | Admitting: Cardiology

## 2023-12-03 ENCOUNTER — Other Ambulatory Visit: Payer: Self-pay

## 2023-12-03 NOTE — Progress Notes (Signed)
 Specialty Pharmacy Refill Coordination Note  Alexander Burton is a 83 y.o. male contacted today regarding refills of specialty medication(s) Asciminib HCl (SCEMBLIX )   Patient requested Delivery   Delivery date: 12/06/23   Verified address: [address] [name] [address]   Medication will be filled on 12/05/23.

## 2023-12-04 ENCOUNTER — Other Ambulatory Visit: Payer: Self-pay

## 2023-12-05 ENCOUNTER — Inpatient Hospital Stay: Admitting: Oncology

## 2023-12-05 LAB — BCR-ABL1, CML/ALL, PCR, QUANT
E1A2 Transcript: 0.0169 %
Interpretation (BCRAL):: POSITIVE — AB
b2a2 transcript: 6.8802 %
b3a2 transcript: 5.9562 %

## 2023-12-11 DIAGNOSIS — L570 Actinic keratosis: Secondary | ICD-10-CM | POA: Diagnosis not present

## 2023-12-11 DIAGNOSIS — Z08 Encounter for follow-up examination after completed treatment for malignant neoplasm: Secondary | ICD-10-CM | POA: Diagnosis not present

## 2023-12-11 DIAGNOSIS — X32XXXD Exposure to sunlight, subsequent encounter: Secondary | ICD-10-CM | POA: Diagnosis not present

## 2023-12-11 DIAGNOSIS — Z1283 Encounter for screening for malignant neoplasm of skin: Secondary | ICD-10-CM | POA: Diagnosis not present

## 2023-12-11 DIAGNOSIS — D225 Melanocytic nevi of trunk: Secondary | ICD-10-CM | POA: Diagnosis not present

## 2023-12-11 DIAGNOSIS — D2262 Melanocytic nevi of left upper limb, including shoulder: Secondary | ICD-10-CM | POA: Diagnosis not present

## 2023-12-11 DIAGNOSIS — L814 Other melanin hyperpigmentation: Secondary | ICD-10-CM | POA: Diagnosis not present

## 2023-12-11 DIAGNOSIS — Z85820 Personal history of malignant melanoma of skin: Secondary | ICD-10-CM | POA: Diagnosis not present

## 2023-12-12 ENCOUNTER — Inpatient Hospital Stay (HOSPITAL_BASED_OUTPATIENT_CLINIC_OR_DEPARTMENT_OTHER): Admitting: Oncology

## 2023-12-12 VITALS — BP 96/71 | HR 61 | Temp 97.8°F | Resp 18

## 2023-12-12 DIAGNOSIS — D509 Iron deficiency anemia, unspecified: Secondary | ICD-10-CM | POA: Diagnosis not present

## 2023-12-12 DIAGNOSIS — R112 Nausea with vomiting, unspecified: Secondary | ICD-10-CM | POA: Diagnosis not present

## 2023-12-12 DIAGNOSIS — C9210 Chronic myeloid leukemia, BCR/ABL-positive, not having achieved remission: Secondary | ICD-10-CM

## 2023-12-12 DIAGNOSIS — D649 Anemia, unspecified: Secondary | ICD-10-CM | POA: Diagnosis not present

## 2023-12-12 DIAGNOSIS — R634 Abnormal weight loss: Secondary | ICD-10-CM | POA: Insufficient documentation

## 2023-12-12 MED ORDER — ONDANSETRON HCL 8 MG PO TABS: 8.0000 mg | ORAL_TABLET | Freq: Three times a day (TID) | ORAL | 3 refills | Status: AC | PRN

## 2023-12-12 NOTE — Progress Notes (Signed)
 [facility] at [facility]  HEMATOLOGY FOLLOW-UP VISIT  Alexander Norleen PEDLAR, Alexander Burton  REASON FOR FOLLOW-UP: CML  ASSESSMENT & PLAN:  Patient is a 83 y.o. male following for leukocytosis  Assessment & Plan CML (chronic myelocytic leukemia) (HCC) Patient has recently diagnosed pH positive CML in chronic phase on bone marrow biopsy done at [facility].  High risk based on Sokol score BCR-ABL.  Quantitative PCR: P210 positive, IS %: 177.85 Flow cytometry:Bone marrow: 94% abnormal granulocytes, 1.1% abnormal monocytes, negative for a discrete population of plasma cells Ultrasound abdomen showed a spleen size of 14 cm ABL kinase mutation testing: Negative Started on asciminib 40 mg twice daily 09/2023 Improvement in BCR-ABL indicating treatment response  - Continue Asciminib.  Tolerating well with some nausea. - Labs reviewed today ANC greater than thousand, platelets greater than 50.  Will hold asciminib for platelets less than 50 in future. - Recommended patient to obtain a CBC the day before dental procedure and if platelets are low, hold the same day 2 days prior to and 1 day after the procedure due to the risk of bleeding.   Return to clinic in 1 month with labs 10 days prior to that Anemia, unspecified type Previous labs showed iron deficiency anemia. S/p IV iron with now improved iron levels and anemia Repeat iron labs with TSAT less than 20 and anemia is persistent  - Will administer 1 more dose of IV iron.  Reemphasized side effects including allergic reactions, nausea and vomiting -Continue oral iron every other day Nausea and vomiting, unspecified vomiting type Likely secondary to his Asciminb use  - Prescribe Zofran  to be taken as needed.  Recommended taking it half an hour to an hour before meals so that he can tolerate the meals. Weight loss  Weight loss, unintentional Patient has significant weight loss Likely due to low appetite and nausea  -  Recommended taking Zofran  half an hour to 1 hour before mealtimes - Nutrition referral today   The total time spent in the appointment was 30 minutes encounter with patients including review of chart and various tests results, discussions about plan of care and coordination of care plan   All questions were answered. The patient knows to call the clinic with any problems, questions or concerns. No barriers to learning was detected.  Mickiel Dry, Alexander Burton 7/17/20253:40 PM   Orders Placed This Encounter  Procedures   CBC with Differential/Platelet   Comprehensive metabolic panel with GFR   BCR-ABL1, CML/ALL, PCR, QUANT    HEMATOLOGY HISTORY: 09/05/2023: Ultrasound abdomen: Spleen size: 14 cm 09/09/2023:  -BCR-ABL.  Quantitative PCR: P210 positive, IS %: 177.85 -Flow cytometry:Bone marrow: 94% abnormal granulocytes, 1.1% abnormal monocytes, negative for a discrete population of plasma cells -Bone marrow biopsy pathology: CML-chronic phase -ABL kinase mutations not detected -JAK2 mutation: Not detected 10/03/2023: Started on asciminib 40 mg twice daily   INTERVAL HISTORY: Alexander Burton 83 y.o. male following for CML.  He is accompanied by his wife and daughter today.  He reports experiencing nausea, tiredness and decreased appetite and side effects of chemotherapy.  He has lack of energy but no current pain but is more energetic and was able to mow his yard today. He reports continuing to lose weight but is able to keep up with meals.  He has no other complaints today.  Overall feels better.  I have reviewed the past medical history, past surgical history, social history and family history with the patient  ALLERGIES:  is allergic to contrast media [iodinated contrast media] and metrizamide.  MEDICATIONS:  Current Outpatient Medications  Medication Sig Dispense Refill   amiodarone  (PACERONE ) 200 MG tablet Take 1 tablet (200 mg total) by mouth daily. 100 tablet 0   amoxicillin   (AMOXIL ) 500 MG capsule Take 1,000 mg by mouth 2 (two) times daily.     apixaban  (ELIQUIS ) 5 MG TABS tablet Take 1 tablet (5 mg total) by mouth every 12 (twelve) hours. 60 tablet 6   asciminib hcl (SCEMBLIX ) 40 MG tablet Take 1 tablet (40 mg total) by mouth 2 (two) times daily. Take on empty stomach, at least one hour before or two hours after food. 60 tablet 2   aspirin  EC 325 MG tablet Take 325 mg by mouth every 6 (six) hours as needed for moderate pain (pain score 4-6).     finasteride  (PROSCAR ) 5 MG tablet Take 5 mg by mouth in the morning.     furosemide  (LASIX ) 20 MG tablet Take 0.5 tablets (10 mg total) by mouth every other day. 45 tablet 3   JARDIANCE  10 MG TABS tablet TAKE 1 TABLET(10 MG) BY MOUTH DAILY BEFORE BREAKFAST 30 tablet 5   metoprolol  succinate (TOPROL -XL) 25 MG 24 hr tablet Take 1 tablet (25 mg total) by mouth daily. 90 tablet 3   mirtazapine (REMERON) 7.5 MG tablet Take 7.5 mg by mouth at bedtime.     Multiple Vitamin (MULITIVITAMIN WITH MINERALS) TABS Take 1 tablet by mouth in the morning. Centrum Silver men 50+     Multiple Vitamins-Minerals (PRESERVISION AREDS) CAPS Take 1 capsule by mouth in the morning and at bedtime.      nitroGLYCERIN (NITROSTAT) 0.4 MG SL tablet Place 0.4 mg under the tongue every 5 (five) minutes as needed for chest pain.     ondansetron  (ZOFRAN ) 8 MG tablet Take 1 tablet (8 mg total) by mouth every 8 (eight) hours as needed for nausea. 30 tablet 3   Oxymetazoline HCl (AFRIN NASAL SPRAY NA) Place 2 sprays into the nose 2 (two) times daily as needed (congestion).     pantoprazole (PROTONIX) 40 MG tablet Take 40 mg by mouth at bedtime.      Polyvinyl Alcohol-Povidone PF (REFRESH) 1.4-0.6 % SOLN Place 1-2 drops into both eyes 3 (three) times daily as needed (dry/irritated eyes).     potassium chloride  (KLOR-CON  M) 10 MEQ tablet TAKE 1 TABLET BY MOUTH every other day with furosemide      potassium chloride  (MICRO-K ) 10 MEQ CR capsule Take 10 mEq by mouth See  admin instructions. Every 3rd day     pravastatin  (PRAVACHOL ) 20 MG tablet TAKE 1 TABLET(20 MG) BY MOUTH DAILY 90 tablet 3   Probiotic Product (PROBIOTIC DAILY PO) Take 1 tablet by mouth in the morning.     tamsulosin  (FLOMAX ) 0.4 MG CAPS capsule Take 0.4 mg by mouth in the morning and at bedtime.   3   ursodiol  (ACTIGALL ) 250 MG tablet Take 1 tablet (250 mg total) by mouth in the morning and at bedtime. 120 tablet 5   No current facility-administered medications for this visit.     REVIEW OF SYSTEMS:   Constitutional: Denies fevers, chills or night sweats Eyes: Denies blurriness of vision Ears, nose, mouth, throat, and face: Denies mucositis or sore throat Respiratory: Denies cough, dyspnea or wheezes Cardiovascular: Denies palpitation, chest discomfort or lower extremity swelling Gastrointestinal:  Denies nausea, heartburn or change in bowel habits Skin: Denies abnormal skin rashes Lymphatics: Denies new lymphadenopathy  or easy bruising Neurological:Denies numbness, tingling or new weaknesses Behavioral/Psych: Mood is stable, no new changes  All other systems were reviewed with the patient and are negative.  PHYSICAL EXAMINATION:   Vitals:   12/12/23 1356  BP: 96/71  Pulse: 61  Resp: 18  Temp: 97.8 F (36.6 C)  SpO2: 99%     GENERAL:alert, no distress and comfortable SKIN: skin color, texture, turgor are normal, no rashes or significant lesions LUNGS: clear to auscultation and percussion with normal breathing effort HEART: regular rate & rhythm and no murmurs and no lower extremity edema ABDOMEN:abdomen soft, non-tender and normal bowel sounds Musculoskeletal:no cyanosis of digits and no clubbing  NEURO: alert & oriented x 3 with fluent speech  LABORATORY DATA:  I have reviewed the data as listed and also results reviewed from recent admission at [facility]  Lab Results  Component Value Date   WBC 4.3 11/28/2023   NEUTROABS 3.4 11/28/2023   HGB 11.6 (L) 11/28/2023    HCT 37.7 (L) 11/28/2023   MCV 91.7 11/28/2023   PLT 77 (L) 11/28/2023       Chemistry      Component Value Date/Time   NA 139 11/28/2023 1346   NA 142 06/23/2014 1122   K 3.7 11/28/2023 1346   K 4.5 06/23/2014 1122   CL 106 11/28/2023 1346   CL 109 (H) 11/19/2012 1044   CO2 25 11/28/2023 1346   CO2 25 06/23/2014 1122   BUN 19 11/28/2023 1346   BUN 15.2 06/23/2014 1122   CREATININE 1.29 (H) 11/28/2023 1346   CREATININE 1.70 (H) 07/29/2023 1129   CREATININE 1.0 06/23/2014 1122      Component Value Date/Time   CALCIUM 7.9 (L) 11/28/2023 1346   CALCIUM 8.4 06/23/2014 1122   ALKPHOS 121 11/28/2023 1346   ALKPHOS 122 06/23/2014 1122   AST 20 11/28/2023 1346   AST 20 06/23/2014 1122   ALT 20 11/28/2023 1346   ALT 17 06/23/2014 1122   BILITOT 0.8 11/28/2023 1346   BILITOT 1.11 06/23/2014 1122      Latest Reference Range & Units 11/28/23 13:45  Iron 45 - 182 ug/dL 34 (L)  UIBC ug/dL 805  TIBC 749 - 549 ug/dL 771 (L)  Saturation Ratios 17.9 - 39.5 % 15 (L)  Ferritin 24 - 336 ng/mL 83  Folate >5.9 ng/mL 20.7  Vitamin B12 180 - 914 pg/mL 566  (L): Data is abnormally low   Latest Reference Range & Units 10/03/23 10:01 10/31/23 14:03 11/28/23 13:46  b2a2 transcript % 20.5240 16.6221 6.8802  b3a2 transcript % 19.2253 25.5651 5.9562  E1A2 Transcript % 0.0250 0.0416 0.0169  Interpretation (BCRAL):  Positive ! Positive ! Positive !  Director Review [facility]):  Comment Comment Comment  References (BCRAL)  Comment Comment Comment  BCR-ABL1, CML/ALL, PCR, QUANT  Rpt ! (C) Rpt ! (C) Rpt ! (C)  !: Data is abnormal (C): Corrected Rpt: View report in Results Review for more information   Latest Reference Range & Units 09/05/23 11:58 09/05/23 11:59  Iron 45 - 182 ug/dL  27 (L)  Total Protein ELP 6.0 - 8.5 g/dL  5.9 (L) (C)  Albumin SerPl Elph-Mcnc 2.9 - 4.4 g/dL  3.0 (C)  Albumin/Glob SerPl 0.7 - 1.7   1.1 (C)  Alpha2 Glob SerPl Elph-Mcnc 0.4 - 1.0 g/dL  0.9 (C)  Alpha 1 0.0 - 0.4  g/dL  0.4 (C)  Gamma Glob SerPl Elph-Mcnc 0.4 - 1.8 g/dL  0.6 (C)  M Protein SerPl Elph-Mcnc  Not Observed g/dL  Not Observed (C)  IFE 1   Comment (C)  Globulin, Total 2.2 - 3.9 g/dL  2.9 (C)  B-Globulin SerPl Elph-Mcnc 0.7 - 1.3 g/dL  1.0 (C)  IgG (Immunoglobin G), Serum 603 - 1,613 mg/dL  299  IgM (Immunoglobulin M), Srm 15 - 143 mg/dL  869  IgA 61 - 562 mg/dL  638  (L): Data is abnormally low (H): Data is abnormally high (C): Corrected   Latest Reference Range & Units 09/05/23 11:58  Kappa free light chain 3.3 - 19.4 mg/L 38.8 (H)  Lambda free light chains 5.7 - 26.3 mg/L 45.1 (H)  Kappa, lambda light chain ratio 0.26 - 1.65  0.86  (H): Data is abnormally high   BCR-ABL.  Quantitative PCR: P210 positive, IS %: 177.85 Flow cytometry:Bone marrow: 94% abnormal granulocytes, 1.1% abnormal monocytes, negative for a discrete population of plasma cells

## 2023-12-12 NOTE — Assessment & Plan Note (Addendum)
 Likely secondary to his Asciminb use  - Prescribe Zofran  to be taken as needed.  Recommended taking it half an hour to an hour before meals so that he can tolerate the meals.

## 2023-12-12 NOTE — Assessment & Plan Note (Addendum)
 Patient has significant weight loss Likely due to low appetite and nausea  - Recommended taking Zofran  half an hour to 1 hour before mealtimes - Nutrition referral today

## 2023-12-12 NOTE — Assessment & Plan Note (Addendum)
 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  High risk based on Sokol score BCR-ABL.  Quantitative PCR: P210 positive, IS %: 177.85 Flow cytometry:Bone marrow: 94% abnormal granulocytes, 1.1% abnormal monocytes, negative for a discrete population of plasma cells Ultrasound abdomen showed a spleen size of 14 cm ABL kinase mutation testing: Negative Started on asciminib 40 mg twice daily 09/2023 Improvement in BCR-ABL indicating treatment response  - Continue Asciminib.  Tolerating well with some nausea. - Labs reviewed today ANC greater than thousand, platelets greater than 50.  Will hold asciminib for platelets less than 50 in future. - Recommended patient to obtain a CBC the day before dental procedure and if platelets are low, hold the same day 2 days prior to and 1 day after the procedure due to the risk of bleeding.   Return to clinic in 1 month with labs 10 days prior to that

## 2023-12-12 NOTE — Assessment & Plan Note (Addendum)
 Previous labs showed iron deficiency anemia. S/p IV iron with now improved iron levels and anemia Repeat iron labs with TSAT less than 20 and anemia is persistent  - Will administer 1 more dose of IV iron.  Reemphasized side effects including allergic reactions, nausea and vomiting -Continue oral iron every other day

## 2023-12-13 ENCOUNTER — Ambulatory Visit (HOSPITAL_COMMUNITY)
Admission: RE | Admit: 2023-12-13 | Discharge: 2023-12-13 | Disposition: A | Discharge status: Home / Self Care | Source: Ambulatory Visit | Attending: Cardiology | Admitting: Cardiology

## 2023-12-13 ENCOUNTER — Encounter (HOSPITAL_COMMUNITY): Payer: Self-pay

## 2023-12-13 ENCOUNTER — Ambulatory Visit (HOSPITAL_BASED_OUTPATIENT_CLINIC_OR_DEPARTMENT_OTHER)
Admission: RE | Admit: 2023-12-13 | Discharge: 2023-12-13 | Disposition: A | Discharge status: Home / Self Care | Source: Ambulatory Visit | Attending: Cardiology | Admitting: Cardiology

## 2023-12-13 VITALS — BP 120/72 | HR 62 | Ht 70.0 in | Wt 165.0 lb

## 2023-12-13 DIAGNOSIS — I4819 Other persistent atrial fibrillation: Secondary | ICD-10-CM | POA: Insufficient documentation

## 2023-12-13 DIAGNOSIS — I081 Rheumatic disorders of both mitral and tricuspid valves: Secondary | ICD-10-CM | POA: Diagnosis not present

## 2023-12-13 DIAGNOSIS — E782 Mixed hyperlipidemia: Secondary | ICD-10-CM | POA: Diagnosis not present

## 2023-12-13 DIAGNOSIS — I3139 Other pericardial effusion (noninflammatory): Secondary | ICD-10-CM | POA: Diagnosis not present

## 2023-12-13 DIAGNOSIS — I480 Paroxysmal atrial fibrillation: Secondary | ICD-10-CM

## 2023-12-13 DIAGNOSIS — K029 Dental caries, unspecified: Secondary | ICD-10-CM | POA: Diagnosis not present

## 2023-12-13 DIAGNOSIS — Z7984 Long term (current) use of oral hypoglycemic drugs: Secondary | ICD-10-CM | POA: Diagnosis not present

## 2023-12-13 DIAGNOSIS — Z79899 Other long term (current) drug therapy: Secondary | ICD-10-CM | POA: Diagnosis not present

## 2023-12-13 DIAGNOSIS — I10 Essential (primary) hypertension: Secondary | ICD-10-CM | POA: Diagnosis not present

## 2023-12-13 DIAGNOSIS — C9210 Chronic myeloid leukemia, BCR/ABL-positive, not having achieved remission: Secondary | ICD-10-CM | POA: Insufficient documentation

## 2023-12-13 DIAGNOSIS — I2510 Atherosclerotic heart disease of native coronary artery without angina pectoris: Secondary | ICD-10-CM | POA: Insufficient documentation

## 2023-12-13 DIAGNOSIS — Z955 Presence of coronary angioplasty implant and graft: Secondary | ICD-10-CM | POA: Diagnosis not present

## 2023-12-13 DIAGNOSIS — I5022 Chronic systolic (congestive) heart failure: Secondary | ICD-10-CM | POA: Insufficient documentation

## 2023-12-13 DIAGNOSIS — Z7901 Long term (current) use of anticoagulants: Secondary | ICD-10-CM | POA: Insufficient documentation

## 2023-12-13 DIAGNOSIS — Z934 Other artificial openings of gastrointestinal tract status: Secondary | ICD-10-CM | POA: Diagnosis not present

## 2023-12-13 LAB — ECHOCARDIOGRAM COMPLETE
Area-P 1/2: 1.86 cm2
Est EF: 55
S' Lateral: 3.9 cm

## 2023-12-13 MED ORDER — FUROSEMIDE 20 MG PO TABS: 10.0000 mg | ORAL_TABLET | ORAL | 1 refills | Status: DC

## 2023-12-13 NOTE — Progress Notes (Addendum)
 ADVANCED HF CLINIC NOTE  PCP: Shona Norleen PEDLAR, MD Cardiology: Dr. Debera HF Cardiology: Dr. Rolan Reason for Visit: Heart Failure   HPI: 83 y.o. with history of persistent atrial fibrillation, CAD, and CHF. He had a DES placed in the LAD in 2/10.  He has a hepaticojejunostomy post-complicated cholecystectomy with common bile duct rupture and chronic hepatic abscess. He has had recurrent cholangitis and recurrent biliary strictures.  In 2023, he reported worsening dyspnea for several months. Echo in 11/23 showed EF 40-45%, mild LV dilation, mild LVH, mildly decreased RV systolic function, mild MR, mild-moderate TR. He was noted to be in atrial fibrillation since 11/23.  He had not been anticoagulated in the past due to low platelets though recently they have been running in the 80s-100s. He has a history of orthostatic hypotension.    Echo in 5/24 showed EF 35%, global hypokinesis, mild LVH, mild RV enlargement and mildly decreased systolic function, severe biatrial enlargement.   S/p DCCV to NSR in 6/24.    Underwent routine biliary tube exchange at [facility] and had later admitted for SBO in 8/24. Treated with bowel rest and NGT.  Echo 1/25 showed EF improved at 45-50%.  Zio 4/25 showed 100% AF/AFL burden.  Admitted 4/25 at [facility] with CAP. Underwent diagnostic thoracentesis showing exudative effusion with lymphocytic predominance. Hematology consulted, underwent BMBx for further evaluation for hematologic malignancy. Noted to be in AFL during admission.  Discharged home off spiro due to AKI.  He had hospital f/u in our clinic 09/27/23 and reported worsening dyspnea since being back in AFL. He was noted to be euvolemic on exam and by ReDs, 26%. He was subsequently set up for outpatient DCCV. This was done on 5/13. He converted back to NSR after 1 attempt.   Also of note, he is following with Hem/Onc. Recent BMBx showed chronic myelogenous leukemia (CML), BCR::ABL1-positive and Plasma cell  dyscrasia, ~5% plasma cells on core biopsy with slight lambda predominance. He just started chemo 2 wks ago. He is being treated w/ asciminib (potentially cardiotoxic label warning). He has f/u w/ Dr. Davonna.  Today he returns for HF follow up.Overall feeling fine. Denies SOB/PND/Orthopnea.  No longer wearing compression stockings due to toe discoloration. Appetite ok. No fever or chills. Taking all medications.   PMH: 1. CAD: DES to LAD in 2/10.  - Cardiolite  (5/23): EF 48%, no ischemia/infarction.  2. COPD 3. BPH 4. Hyperlipidemia 5. Melanoma 6. Atrial fibrillation: Persistent - DCCV to NSR in 6/24  7. Chronic thrombocytopenia 8. OSA 9. Chronic dysphagia 10. Biliary system disease: Complicated cholecystectomy with rupture of common bile duct, chronic hepatic abscess after this.   - Hepaticojejunostomy with recurrent cholangitis.  - Recurrent biliary stricture requiring dilations.  11. Chronic HF with mid range EF: Echo (2/18) with EF 55-60%.  Echo (11/23) with EF 40-45%, mild LV dilation, mild LVH, mildly decreased RV systolic function, mild MR, mild-moderate TR.  - Echo (5/24): EF 35%, global hypokinesis, mild LVH, mild RV enlargement and mildly decreased systolic function, severe biatrial enlargement.  - Echo (1/25): EF 45-50%, normal RV 12. CML- 09/2023 asciminib twice a day.    SH: Married, [place] near [place], nonsmoker, no ETOH.   Family History  Problem Relation Age of Onset   Colon cancer Sister    Heart disease Mother    Emphysema Father    ROS: All systems reviewed and negative except as per HPI.  Current Outpatient Medications  Medication Sig Dispense Refill  amiodarone  (PACERONE ) 200 MG tablet Take 1 tablet (200 mg total) by mouth daily. 100 tablet 0   amoxicillin  (AMOXIL ) 500 MG capsule Take 1,000 mg by mouth 2 (two) times daily.     apixaban  (ELIQUIS ) 5 MG TABS tablet Take 1 tablet (5 mg total) by mouth every 12 (twelve) hours. 60 tablet 6    asciminib hcl (SCEMBLIX ) 40 MG tablet Take 1 tablet (40 mg total) by mouth 2 (two) times daily. Take on empty stomach, at least one hour before or two hours after food. 60 tablet 2   aspirin  EC 325 MG tablet Take 325 mg by mouth every 6 (six) hours as needed for moderate pain (pain score 4-6).     finasteride  (PROSCAR ) 5 MG tablet Take 5 mg by mouth in the morning.     JARDIANCE  10 MG TABS tablet TAKE 1 TABLET(10 MG) BY MOUTH DAILY BEFORE BREAKFAST 30 tablet 5   metoprolol  succinate (TOPROL -XL) 25 MG 24 hr tablet Take 1 tablet (25 mg total) by mouth daily. 90 tablet 3   mirtazapine (REMERON) 7.5 MG tablet Take 7.5 mg by mouth at bedtime.     Multiple Vitamin (MULITIVITAMIN WITH MINERALS) TABS Take 1 tablet by mouth in the morning. Centrum Silver men 50+     Multiple Vitamins-Minerals (PRESERVISION AREDS) CAPS Take 1 capsule by mouth in the morning and at bedtime.      nitroGLYCERIN (NITROSTAT) 0.4 MG SL tablet Place 0.4 mg under the tongue every 5 (five) minutes as needed for chest pain.     ondansetron  (ZOFRAN ) 8 MG tablet Take 1 tablet (8 mg total) by mouth every 8 (eight) hours as needed for nausea. 30 tablet 3   Oxymetazoline HCl (AFRIN NASAL SPRAY NA) Place 2 sprays into the nose 2 (two) times daily as needed (congestion).     pantoprazole (PROTONIX) 40 MG tablet Take 40 mg by mouth at bedtime.      Polyvinyl Alcohol-Povidone PF (REFRESH) 1.4-0.6 % SOLN Place 1-2 drops into both eyes 3 (three) times daily as needed (dry/irritated eyes).     potassium chloride  (KLOR-CON  M) 10 MEQ tablet TAKE 1 TABLET BY MOUTH every other day with furosemide      potassium chloride  (MICRO-K ) 10 MEQ CR capsule Take 10 mEq by mouth See admin instructions. Every 3rd day     pravastatin  (PRAVACHOL ) 20 MG tablet TAKE 1 TABLET(20 MG) BY MOUTH DAILY 90 tablet 3   Probiotic Product (PROBIOTIC DAILY PO) Take 1 tablet by mouth in the morning.     tamsulosin  (FLOMAX ) 0.4 MG CAPS capsule Take 0.4 mg by mouth in the morning  and at bedtime.   3   ursodiol  (ACTIGALL ) 250 MG tablet Take 1 tablet (250 mg total) by mouth in the morning and at bedtime. 120 tablet 5   furosemide  (LASIX ) 20 MG tablet Take 0.5 tablets (10 mg total) by mouth every other day. 60 tablet 1   No current facility-administered medications for this encounter.   Wt Readings from Last 3 Encounters:  12/13/23 74.8 kg (165 lb)  11/21/23 74.4 kg (164 lb)  10/31/23 76.8 kg (169 lb 6.4 oz)   BP 120/72   Pulse 62   Ht 5' 10 (1.778 m)   Wt 74.8 kg (165 lb)   SpO2 96%   BMI 23.68 kg/m   PHYSICAL EXAM: General:   No resp difficulty Neck: no JVD.  Cor: Regular rate & rhythm. Lungs: clear Abdomen: soft, nontender, nondistended.  Extremities: R and LLE 2+  edema Neuro: alert & oriented x3   ReDs reading:  Assessment/Plan: 1. Chronic HF with mid-range EF: Echo (11/23) with EF 40-45%, mild LV dilation, mild LVH, mildly decreased RV systolic function, mild MR, mild-moderate TR.  Cardiolite  in 5/23 with no ischemia or infarction.  Echo in 5/24 with EF 35%, global hypokinesis, mild LVH, mild RV enlargement and mildly decreased systolic function, severe biatrial enlargement.  The onset of atrial fibrillation may have triggered CHF worsening, now s/p DCCV to NSR in 6/24, required repeat DCCV 5/25 for recurrent Afib, now back in NSR.  He has had no chest pain. Echo (1/25): EF 45-50%, normal RV.  - NYHA II. Volume status trending up. Instructed to take lasix  20 mg today and tomorrow then resume lasix  10 mg daily. .  - Continue Jardiance  10 mg daily.  - Continue Toprol  XL 25 mg daily.  - given chemo treatment w/ asciminib (potentially cardiotoxic label warning), will need to follow serial echocardiograms w/ strain imaging. Scheduled for 7/18. Dr Rolan reviewed todays Echo. Images stable. LVEF unchanged from previous.  2. Atrial fibrillation: S/p DCCV in 6/24. Had been in NSR until ~ 08/2023. Zio 4/25 showed 100% AF/AFL burden. Underwent repeat DCCV on  5/25 back to NSR.  -Regular on exam.  - Continue Toprol  XL 25 mg daily.  - Continue Eliquis  5 mg bid.  - Continue amiodarone  200 mg daily. Recent CMET stable.  3. Recurrent biliary stricture with hepaticojejunostomy: Biliary stent has been removed.  4. CAD: DES to LAD in 2/10.  No chest pain.  Cardiolite  in 5/23 showed no ischemia. EF was lower on last echo but this may be related to AF, will hold off on cath for now with no chest pain. Echo 1/25 showed EF improved to 45-50% with restoration of NSR. No chest pain.  - Continue pravastatin , lipids at goal 3/25. LDL goal < 55  5. Leukocytosis: Following with Heme/Onc. Results of BMBx demonstrated Chronic myelogenous leukemia (CML), BCR::ABL1-positive + Plasma cell dyscrasia, ~5% plasma cells on core biopsy with slight lambda predominance. - followed by hem/onc just started chemotherapy.  He is being treated w/ asciminib (potentially cardiotoxic label warning). Will need to follow serial echocardiograms w/ strain imaging  6. Dental Carries: wife reports pt has 2 teeth that need to be extracted. Ok to come off Eliquis  for 3 days prior to teeth removal from HF standpoint (>4wk out from DCCV), however he would need to get clearance from heme/onc given degree of thrombocytopenia and bleed risk .   Follow up in 3 months with Dr Rolan with an Echo.  Greig Mosses, NP 12/13/2023

## 2023-12-13 NOTE — Patient Instructions (Addendum)
   Good to see you today!  Take Lasix  20 mg tomorrow and Sunday  Then lasix  10 mg every other day  Your physician has requested that you have an echocardiogram. Echocardiography is a painless test that uses sound waves to create images of your heart. It provides your doctor with information about the size and shape of your heart and how well your heart's chambers and valves are working. This procedure takes approximately one hour. There are no restrictions for this procedure. Please do NOT wear cologne, perfume, aftershave, or lotions (deodorant is allowed). Please arrive 15 minutes prior to your appointment time.  Please note: We ask at that you not bring children with you during ultrasound (echo/ vascular) testing. Due to room size and safety concerns, children are not allowed in the ultrasound rooms during exams. Our front office staff cannot provide observation of children in our lobby area while testing is being conducted. An adult accompanying a patient to their appointment will only be allowed in the ultrasound room at the discretion of the ultrasound technician under special circumstances. We apologize for any inconvenience.  Your physician recommends that you schedule a follow-up appointment 3 months(October) Call office in August to schedule an appointment  If you have any questions or concerns before your next appointment please send us  a message through [facility] or call our office at [phone].    TO LEAVE A MESSAGE FOR THE NURSE SELECT OPTION 2, PLEASE LEAVE A MESSAGE INCLUDING: YOUR NAME DATE OF BIRTH CALL BACK NUMBER REASON FOR CALL**this is important as we prioritize the call backs  YOU WILL RECEIVE A CALL BACK THE SAME DAY AS LONG AS YOU CALL BEFORE 4:00 PM At the [facility], you and your health needs are our priority. As part of our continuing mission to provide you with exceptional heart care, we have created designated Provider Care Teams. These Care  Teams include your primary Cardiologist (physician) and Advanced Practice Providers (APPs- Physician Assistants and Nurse Practitioners) who all work together to provide you with the care you need, when you need it.   You may see any of the following providers on your designated Care Team at your next follow up: Dr Toribio Fuel Dr Ezra Shuck Dr. Ria Commander Dr. Morene Brownie Lakeem Rozo Lenetta, NP Caffie Shed, [place] [facility] [place], [place] Beckey Coe, NP [place] Lee, NP Ellouise Class, NP Tinnie Redman, PharmD Jaun Bash, PharmD   Please be sure to bring in all your medications bottles to every appointment.    Thank you for choosing [facility]

## 2023-12-15 ENCOUNTER — Ambulatory Visit (HOSPITAL_COMMUNITY): Payer: Self-pay | Admitting: Cardiology

## 2023-12-17 ENCOUNTER — Inpatient Hospital Stay

## 2023-12-17 VITALS — BP 98/56 | HR 54 | Temp 97.0°F | Resp 18 | Wt 159.0 lb

## 2023-12-17 DIAGNOSIS — R630 Anorexia: Secondary | ICD-10-CM | POA: Diagnosis not present

## 2023-12-17 DIAGNOSIS — E782 Mixed hyperlipidemia: Secondary | ICD-10-CM | POA: Diagnosis not present

## 2023-12-17 DIAGNOSIS — D509 Iron deficiency anemia, unspecified: Secondary | ICD-10-CM

## 2023-12-17 DIAGNOSIS — N401 Enlarged prostate with lower urinary tract symptoms: Secondary | ICD-10-CM | POA: Diagnosis not present

## 2023-12-17 DIAGNOSIS — I480 Paroxysmal atrial fibrillation: Secondary | ICD-10-CM | POA: Diagnosis not present

## 2023-12-17 DIAGNOSIS — K8309 Other cholangitis: Secondary | ICD-10-CM | POA: Diagnosis not present

## 2023-12-17 DIAGNOSIS — K219 Gastro-esophageal reflux disease without esophagitis: Secondary | ICD-10-CM | POA: Diagnosis not present

## 2023-12-17 DIAGNOSIS — R634 Abnormal weight loss: Secondary | ICD-10-CM | POA: Diagnosis not present

## 2023-12-17 DIAGNOSIS — I10 Essential (primary) hypertension: Secondary | ICD-10-CM | POA: Diagnosis not present

## 2023-12-17 DIAGNOSIS — C9210 Chronic myeloid leukemia, BCR/ABL-positive, not having achieved remission: Secondary | ICD-10-CM | POA: Diagnosis not present

## 2023-12-17 DIAGNOSIS — D649 Anemia, unspecified: Secondary | ICD-10-CM | POA: Diagnosis not present

## 2023-12-17 DIAGNOSIS — I5022 Chronic systolic (congestive) heart failure: Secondary | ICD-10-CM | POA: Diagnosis not present

## 2023-12-17 MED ORDER — SODIUM CHLORIDE 0.9 % IV SOLN: INTRAVENOUS | Status: DC

## 2023-12-17 MED ORDER — ACETAMINOPHEN 325 MG PO TABS
650.0000 mg | ORAL_TABLET | Freq: Once | ORAL | Status: AC
  Administered 2023-12-17: 650 mg via ORAL

## 2023-12-17 MED ORDER — SODIUM CHLORIDE 0.9 % IV SOLN
1000.0000 mg | Freq: Once | INTRAVENOUS | Status: AC
  Administered 2023-12-17: 1000 mg via INTRAVENOUS
  Filled 2023-12-17: qty 10

## 2023-12-17 MED ORDER — CETIRIZINE HCL 10 MG/ML IV SOLN
5.0000 mg | Freq: Once | INTRAVENOUS | Status: AC
  Administered 2023-12-17: 5 mg via INTRAVENOUS
  Filled 2023-12-17: qty 1

## 2023-12-17 NOTE — Progress Notes (Signed)
 Patient presents today for iron infusion. Monoferric . Vital signs stable. Patient denies any side effects related to his last iron.   Monoferric  given today per MD orders. Tolerated infusion without adverse affects. Vital signs stable. No complaints at this time. Discharged from clinic ambulatory in stable condition. Alert and oriented x 3. F/U with [facility] as scheduled.

## 2023-12-19 DIAGNOSIS — K8309 Other cholangitis: Secondary | ICD-10-CM | POA: Diagnosis not present

## 2023-12-19 DIAGNOSIS — R600 Localized edema: Secondary | ICD-10-CM | POA: Diagnosis not present

## 2023-12-19 DIAGNOSIS — R630 Anorexia: Secondary | ICD-10-CM | POA: Diagnosis not present

## 2023-12-19 DIAGNOSIS — I130 Hypertensive heart and chronic kidney disease with heart failure and stage 1 through stage 4 chronic kidney disease, or unspecified chronic kidney disease: Secondary | ICD-10-CM | POA: Diagnosis not present

## 2023-12-19 DIAGNOSIS — D649 Anemia, unspecified: Secondary | ICD-10-CM | POA: Diagnosis not present

## 2023-12-19 DIAGNOSIS — I480 Paroxysmal atrial fibrillation: Secondary | ICD-10-CM | POA: Diagnosis not present

## 2023-12-19 DIAGNOSIS — D696 Thrombocytopenia, unspecified: Secondary | ICD-10-CM | POA: Diagnosis not present

## 2023-12-19 DIAGNOSIS — I2510 Atherosclerotic heart disease of native coronary artery without angina pectoris: Secondary | ICD-10-CM | POA: Diagnosis not present

## 2023-12-19 DIAGNOSIS — I10 Essential (primary) hypertension: Secondary | ICD-10-CM | POA: Diagnosis not present

## 2023-12-19 DIAGNOSIS — I429 Cardiomyopathy, unspecified: Secondary | ICD-10-CM | POA: Diagnosis not present

## 2023-12-19 DIAGNOSIS — I5022 Chronic systolic (congestive) heart failure: Secondary | ICD-10-CM | POA: Diagnosis not present

## 2023-12-19 DIAGNOSIS — C9210 Chronic myeloid leukemia, BCR/ABL-positive, not having achieved remission: Secondary | ICD-10-CM | POA: Diagnosis not present

## 2023-12-26 DIAGNOSIS — I5022 Chronic systolic (congestive) heart failure: Secondary | ICD-10-CM | POA: Diagnosis not present

## 2023-12-26 DIAGNOSIS — E782 Mixed hyperlipidemia: Secondary | ICD-10-CM | POA: Diagnosis not present

## 2023-12-26 DIAGNOSIS — N401 Enlarged prostate with lower urinary tract symptoms: Secondary | ICD-10-CM | POA: Diagnosis not present

## 2023-12-26 DIAGNOSIS — I10 Essential (primary) hypertension: Secondary | ICD-10-CM | POA: Diagnosis not present

## 2023-12-27 ENCOUNTER — Other Ambulatory Visit: Payer: Self-pay | Admitting: Oncology

## 2023-12-27 ENCOUNTER — Other Ambulatory Visit: Payer: Self-pay

## 2023-12-27 ENCOUNTER — Other Ambulatory Visit (HOSPITAL_COMMUNITY): Payer: Self-pay

## 2023-12-27 MED ORDER — ASCIMINIB HCL 40 MG PO TABS
40.0000 mg | ORAL_TABLET | Freq: Two times a day (BID) | ORAL | 2 refills | Status: DC
  Filled 2023-12-27 – 2023-12-30 (×3): qty 60, 30d supply, fill #0
  Filled 2024-01-24 – 2024-01-28 (×2): qty 60, 30d supply, fill #1
  Filled 2024-02-21 – 2024-02-27 (×2): qty 60, 30d supply, fill #2

## 2023-12-27 NOTE — Telephone Encounter (Signed)
 Chart reviewed. Scemblix  refilled per last office note with Dr. Davonna.

## 2023-12-30 ENCOUNTER — Other Ambulatory Visit: Payer: Self-pay

## 2023-12-30 ENCOUNTER — Inpatient Hospital Stay: Attending: Oncology

## 2023-12-30 DIAGNOSIS — C9210 Chronic myeloid leukemia, BCR/ABL-positive, not having achieved remission: Secondary | ICD-10-CM | POA: Insufficient documentation

## 2023-12-30 DIAGNOSIS — D509 Iron deficiency anemia, unspecified: Secondary | ICD-10-CM | POA: Insufficient documentation

## 2023-12-30 LAB — CBC WITH DIFFERENTIAL/PLATELET
Abs Immature Granulocytes: 0.01 K/uL (ref 0.00–0.07)
Basophils Absolute: 0 K/uL (ref 0.0–0.1)
Basophils Relative: 0 %
Eosinophils Absolute: 0.1 K/uL (ref 0.0–0.5)
Eosinophils Relative: 1 %
HCT: 39.9 % (ref 39.0–52.0)
Hemoglobin: 12.8 g/dL — ABNORMAL LOW (ref 13.0–17.0)
Immature Granulocytes: 0 %
Lymphocytes Relative: 12 %
Lymphs Abs: 0.6 K/uL — ABNORMAL LOW (ref 0.7–4.0)
MCH: 30.4 pg (ref 26.0–34.0)
MCHC: 32.1 g/dL (ref 30.0–36.0)
MCV: 94.8 fL (ref 80.0–100.0)
Monocytes Absolute: 0.4 K/uL (ref 0.1–1.0)
Monocytes Relative: 7 %
Neutro Abs: 4.3 K/uL (ref 1.7–7.7)
Neutrophils Relative %: 80 %
Platelets: 71 K/uL — ABNORMAL LOW (ref 150–400)
RBC: 4.21 MIL/uL — ABNORMAL LOW (ref 4.22–5.81)
RDW: 18.8 % — ABNORMAL HIGH (ref 11.5–15.5)
WBC: 5.4 K/uL (ref 4.0–10.5)
nRBC: 0 % (ref 0.0–0.2)

## 2023-12-30 LAB — COMPREHENSIVE METABOLIC PANEL WITH GFR
ALT: 19 U/L (ref 0–44)
AST: 22 U/L (ref 15–41)
Albumin: 3.1 g/dL — ABNORMAL LOW (ref 3.5–5.0)
Alkaline Phosphatase: 132 U/L — ABNORMAL HIGH (ref 38–126)
Anion gap: 8 (ref 5–15)
BUN: 17 mg/dL (ref 8–23)
CO2: 24 mmol/L (ref 22–32)
Calcium: 7.8 mg/dL — ABNORMAL LOW (ref 8.9–10.3)
Chloride: 108 mmol/L (ref 98–111)
Creatinine, Ser: 1.21 mg/dL (ref 0.61–1.24)
GFR, Estimated: 59 mL/min — ABNORMAL LOW (ref >60)
Glucose, Bld: 110 mg/dL — ABNORMAL HIGH (ref 70–99)
Potassium: 4.2 mmol/L (ref 3.5–5.1)
Sodium: 140 mmol/L (ref 135–145)
Total Bilirubin: 0.8 mg/dL (ref 0.0–1.2)
Total Protein: 6 g/dL — ABNORMAL LOW (ref 6.5–8.1)

## 2023-12-30 NOTE — Progress Notes (Signed)
 Specialty Pharmacy Refill Coordination Note  [name] is a 83 y.o. male contacted today regarding refills of specialty medication(s) Asciminib HCl (SCEMBLIX )   Patient requested Delivery   Delivery date: 01/04/24   Verified address: [address] [name] [address]   Medication will be filled on 01/03/24.

## 2024-01-02 DIAGNOSIS — N401 Enlarged prostate with lower urinary tract symptoms: Secondary | ICD-10-CM | POA: Diagnosis not present

## 2024-01-02 DIAGNOSIS — R972 Elevated prostate specific antigen [PSA]: Secondary | ICD-10-CM | POA: Diagnosis not present

## 2024-01-02 DIAGNOSIS — N529 Male erectile dysfunction, unspecified: Secondary | ICD-10-CM | POA: Diagnosis not present

## 2024-01-03 ENCOUNTER — Other Ambulatory Visit: Payer: Self-pay

## 2024-01-03 LAB — BCR-ABL1, CML/ALL, PCR, QUANT
E1A2 Transcript: <0.0032 %
Interpretation (BCRAL):: POSITIVE — AB
b2a2 transcript: 0.0734 %
b3a2 transcript: 0.1039 %

## 2024-01-06 ENCOUNTER — Telehealth: Payer: Self-pay | Admitting: Dietician

## 2024-01-06 ENCOUNTER — Inpatient Hospital Stay: Admitting: Dietician

## 2024-01-06 NOTE — Telephone Encounter (Signed)
 Nutrition Assessment   Reason for Assessment: Referral (wt loss)   ASSESSMENT: 83 year old male with CML. He is receiving asciminib 40 mg BID (started 09/2023). Patient is under the care of Dr. Davonna.   Past medical history includes HTN, CAD, PAF, CAD, COPD, recurrent cholangitis, GERD, biliary stricture, HLD, IDA  Spoke with pt wife via telephone for nutrition assessment. 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 Wife reports the last 2 weeks have been good. His appetite is picking up. Nausea has resolved. He has not needed antiemetics the last 3-4 days. Patient is eating 3 meals. Recalls cereal (whole milk), fruit, coffee, juice for breakfast. Had chicken noodle soup and ham sandwich for lunch. Patient had whiting, shrimp, cole slaw, fries for dinner last night. Wife notes he ate the majority of his plate. Patient has Ensure in the fridge, however these are very filling and spoil appetite for meals.    Nutrition Focused Physical Exam: unable to complete (telephone visit)    Medications: pacerone , eliquis , proscar , lasix , jardiance , toprol , remeron, zofran , protonix, klor-con , micro-K , pravastatin , flomax , ursodiol , probiotic   Labs: 8/4 - glucose 110, albumin 3.1   Anthropometrics:   Height: 5'10 Weight: 159 lb (7/22) UBW: 170-175 lb  BMI: 22.81  NUTRITION DIAGNOSIS: Unintended wt loss related to side effects of therapy as evidenced by nausea, 9% wt loss in 2 months which is severe for time frame   INTERVENTION:  Continue eating 3 meals/day - encourage high calorie high protein foods for wt maintenance/gain Encourage daily Ensure plus/equivalent as tolerated - suggested splitting over 2 servings or having as a bedtime snack Take antiemetics as needed  Will leave handouts, Ensure samples/coupons, contact information for p/u at next [facility] visit (8/14) - wife aware   MONITORING, EVALUATION, GOAL: Pt will tolerate increased calories and protein to promote wt  gain   Next Visit: To be scheduled as needed

## 2024-01-08 NOTE — Assessment & Plan Note (Deleted)
 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  High risk based on Sokol score BCR-ABL.  Quantitative PCR: P210 positive, IS %: 177.85 Flow cytometry:Bone marrow: 94% abnormal granulocytes, 1.1% abnormal monocytes, negative for a discrete population of plasma cells Ultrasound abdomen showed a spleen size of 14 cm ABL kinase mutation testing: Negative Started on asciminib 40 mg twice daily 09/2023 Improvement in BCR-ABL indicating treatment response  - Continue Asciminib.  Tolerating well with some nausea. - Labs reviewed today ANC greater than thousand, platelets greater than 50.  Will hold asciminib for platelets less than 50 in future. - Recommended patient to obtain a CBC the day before dental procedure and if platelets are low, hold the same day 2 days prior to and 1 day after the procedure due to the risk of bleeding.   Return to clinic in 1 month with labs 10 days prior to that

## 2024-01-08 NOTE — Progress Notes (Deleted)
 [facility] at [facility]  HEMATOLOGY FOLLOW-UP VISIT  Alexander Norleen PEDLAR, MD  REASON FOR FOLLOW-UP: CML  ASSESSMENT & PLAN:  Patient is a 83 y.o. male following for leukocytosis  Assessment & Plan CML (chronic myelocytic leukemia) (HCC) Patient has recently diagnosed pH positive CML in chronic phase on bone marrow biopsy done at [facility].  High risk based on Sokol score BCR-ABL.  Quantitative PCR: P210 positive, IS %: 177.85 Flow cytometry:Bone marrow: 94% abnormal granulocytes, 1.1% abnormal monocytes, negative for a discrete population of plasma cells Ultrasound abdomen showed a spleen size of 14 cm ABL kinase mutation testing: Negative Started on asciminib 40 mg twice daily 09/2023 Improvement in BCR-ABL indicating treatment response  - Continue Asciminib.  Tolerating well with some nausea. - Labs reviewed today ANC greater than thousand, platelets greater than 50.  Will hold asciminib for platelets less than 50 in future. - Recommended patient to obtain a CBC the day before dental procedure and if platelets are low, hold the same day 2 days prior to and 1 day after the procedure due to the risk of bleeding.   Return to clinic in 1 month with labs 10 days prior to that   The total time spent in the appointment was 30 minutes encounter with patients including review of chart and various tests results, discussions about plan of care and coordination of care plan   All questions were answered. The patient knows to call the clinic with any problems, questions or concerns. No barriers to learning was detected.  Mickiel Dry, MD 8/13/202510:13 PM   No orders of the defined types were placed in this encounter.   HEMATOLOGY HISTORY: 09/05/2023: Ultrasound abdomen: Spleen size: 14 cm 09/09/2023:  -BCR-ABL.  Quantitative PCR: P210 positive, IS %: 177.85 -Flow cytometry:Bone marrow: 94% abnormal granulocytes, 1.1% abnormal monocytes, negative for a  discrete population of plasma cells -Bone marrow biopsy pathology: CML-chronic phase -ABL kinase mutations not detected -JAK2 mutation: Not detected 10/03/2023: Started on asciminib 40 mg twice daily   INTERVAL HISTORY: Alexander Burton 83 y.o. male following for CML.  He is accompanied by his wife and daughter today.  He reports experiencing nausea, tiredness and decreased appetite and side effects of chemotherapy.  He has lack of energy but no current pain but is more energetic and was able to mow his yard today. He reports continuing to lose weight but is able to keep up with meals.  He has no other complaints today.  Overall feels better.  I have reviewed the past medical history, past surgical history, social history and family history with the patient   ALLERGIES:  is allergic to contrast media [iodinated contrast media] and metrizamide.  MEDICATIONS:  Current Outpatient Medications  Medication Sig Dispense Refill   amiodarone  (PACERONE ) 200 MG tablet Take 1 tablet (200 mg total) by mouth daily. 100 tablet 0   amoxicillin  (AMOXIL ) 500 MG capsule Take 1,000 mg by mouth 2 (two) times daily.     apixaban  (ELIQUIS ) 5 MG TABS tablet Take 1 tablet (5 mg total) by mouth every 12 (twelve) hours. 60 tablet 6   asciminib hcl (SCEMBLIX ) 40 MG tablet Take 1 tablet (40 mg total) by mouth 2 (two) times daily. Take on empty stomach, at least one hour before or two hours after food. 60 tablet 2   aspirin  EC 325 MG tablet Take 325 mg by mouth every 6 (six) hours as needed for moderate pain (pain score 4-6).  finasteride  (PROSCAR ) 5 MG tablet Take 5 mg by mouth in the morning.     furosemide  (LASIX ) 20 MG tablet Take 0.5 tablets (10 mg total) by mouth every other day. 60 tablet 1   JARDIANCE  10 MG TABS tablet TAKE 1 TABLET(10 MG) BY MOUTH DAILY BEFORE BREAKFAST 30 tablet 5   metoprolol  succinate (TOPROL -XL) 25 MG 24 hr tablet Take 1 tablet (25 mg total) by mouth daily. 90 tablet 3   mirtazapine  (REMERON) 7.5 MG tablet Take 7.5 mg by mouth at bedtime.     Multiple Vitamin (MULITIVITAMIN WITH MINERALS) TABS Take 1 tablet by mouth in the morning. Centrum Silver men 50+     Multiple Vitamins-Minerals (PRESERVISION AREDS) CAPS Take 1 capsule by mouth in the morning and at bedtime.      nitroGLYCERIN (NITROSTAT) 0.4 MG SL tablet Place 0.4 mg under the tongue every 5 (five) minutes as needed for chest pain.     ondansetron  (ZOFRAN ) 8 MG tablet Take 1 tablet (8 mg total) by mouth every 8 (eight) hours as needed for nausea. 30 tablet 3   Oxymetazoline HCl (AFRIN NASAL SPRAY NA) Place 2 sprays into the nose 2 (two) times daily as needed (congestion).     pantoprazole (PROTONIX) 40 MG tablet Take 40 mg by mouth at bedtime.      Polyvinyl Alcohol-Povidone PF (REFRESH) 1.4-0.6 % SOLN Place 1-2 drops into both eyes 3 (three) times daily as needed (dry/irritated eyes).     potassium chloride  (KLOR-CON  M) 10 MEQ tablet TAKE 1 TABLET BY MOUTH every other day with furosemide      potassium chloride  (MICRO-K ) 10 MEQ CR capsule Take 10 mEq by mouth See admin instructions. Every 3rd day     pravastatin  (PRAVACHOL ) 20 MG tablet TAKE 1 TABLET(20 MG) BY MOUTH DAILY 90 tablet 3   Probiotic Product (PROBIOTIC DAILY PO) Take 1 tablet by mouth in the morning.     tamsulosin  (FLOMAX ) 0.4 MG CAPS capsule Take 0.4 mg by mouth in the morning and at bedtime.   3   ursodiol  (ACTIGALL ) 250 MG tablet Take 1 tablet (250 mg total) by mouth in the morning and at bedtime. 120 tablet 5   No current facility-administered medications for this visit.     REVIEW OF SYSTEMS:   Constitutional: Denies fevers, chills or night sweats Eyes: Denies blurriness of vision Ears, nose, mouth, throat, and face: Denies mucositis or sore throat Respiratory: Denies cough, dyspnea or wheezes Cardiovascular: Denies palpitation, chest discomfort or lower extremity swelling Gastrointestinal:  Denies nausea, heartburn or change in bowel  habits Skin: Denies abnormal skin rashes Lymphatics: Denies new lymphadenopathy or easy bruising Neurological:Denies numbness, tingling or new weaknesses Behavioral/Psych: Mood is stable, no new changes  All other systems were reviewed with the patient and are negative.  PHYSICAL EXAMINATION:   There were no vitals filed for this visit.    GENERAL:alert, no distress and comfortable SKIN: skin color, texture, turgor are normal, no rashes or significant lesions LUNGS: clear to auscultation and percussion with normal breathing effort HEART: regular rate & rhythm and no murmurs and no lower extremity edema ABDOMEN:abdomen soft, non-tender and normal bowel sounds Musculoskeletal:no cyanosis of digits and no clubbing  NEURO: alert & oriented x 3 with fluent speech  LABORATORY DATA:  I have reviewed the data as listed and also results reviewed from recent admission at [facility]  Lab Results  Component Value Date   WBC 5.4 12/30/2023   NEUTROABS 4.3 12/30/2023   HGB  12.8 (L) 12/30/2023   HCT 39.9 12/30/2023   MCV 94.8 12/30/2023   PLT 71 (L) 12/30/2023       Chemistry      Component Value Date/Time   NA 140 12/30/2023 1212   NA 142 06/23/2014 1122   K 4.2 12/30/2023 1212   K 4.5 06/23/2014 1122   CL 108 12/30/2023 1212   CL 109 (H) 11/19/2012 1044   CO2 24 12/30/2023 1212   CO2 25 06/23/2014 1122   BUN 17 12/30/2023 1212   BUN 15.2 06/23/2014 1122   CREATININE 1.21 12/30/2023 1212   CREATININE 1.70 (H) 07/29/2023 1129   CREATININE 1.0 06/23/2014 1122      Component Value Date/Time   CALCIUM 7.8 (L) 12/30/2023 1212   CALCIUM 8.4 06/23/2014 1122   ALKPHOS 132 (H) 12/30/2023 1212   ALKPHOS 122 06/23/2014 1122   AST 22 12/30/2023 1212   AST 20 06/23/2014 1122   ALT 19 12/30/2023 1212   ALT 17 06/23/2014 1122   BILITOT 0.8 12/30/2023 1212   BILITOT 1.11 06/23/2014 1122       Latest Reference Range & Units 10/03/23 10:01 10/31/23 14:03 11/28/23 13:46 12/30/23 12:12   b2a2 transcript % 20.5240 16.6221 6.8802 0.0734  b3a2 transcript % 19.2253 25.5651 5.9562 0.1039  E1A2 Transcript % 0.0250 0.0416 0.0169 <0.0032  Interpretation (BCRAL):  Positive ! Positive ! Positive ! Positive !  Director Review [facility]):  Comment Comment Comment Comment  References (BCRAL)  Comment Comment Comment Comment  BCR-ABL1, CML/ALL, PCR, QUANT  Rpt ! (C) Rpt ! (C) Rpt ! (C) Rpt ! (C)  !: Data is abnormal (C): Corrected Rpt: View report in Results Review for more information   Latest Reference Range & Units 11/28/23 13:45  Iron 45 - 182 ug/dL 34 (L)  UIBC ug/dL 805  TIBC 749 - 549 ug/dL 771 (L)  Saturation Ratios 17.9 - 39.5 % 15 (L)  Ferritin 24 - 336 ng/mL 83  Folate >5.9 ng/mL 20.7  (L): Data is abnormally low   Latest Reference Range & Units 09/05/23 11:58 09/05/23 11:59  Iron 45 - 182 ug/dL  27 (L)  Total Protein ELP 6.0 - 8.5 g/dL  5.9 (L) (C)  Albumin SerPl Elph-Mcnc 2.9 - 4.4 g/dL  3.0 (C)  Albumin/Glob SerPl 0.7 - 1.7   1.1 (C)  Alpha2 Glob SerPl Elph-Mcnc 0.4 - 1.0 g/dL  0.9 (C)  Alpha 1 0.0 - 0.4 g/dL  0.4 (C)  Gamma Glob SerPl Elph-Mcnc 0.4 - 1.8 g/dL  0.6 (C)  M Protein SerPl Elph-Mcnc Not Observed g/dL  Not Observed (C)  IFE 1   Comment (C)  Globulin, Total 2.2 - 3.9 g/dL  2.9 (C)  B-Globulin SerPl Elph-Mcnc 0.7 - 1.3 g/dL  1.0 (C)  IgG (Immunoglobin G), Serum 603 - 1,613 mg/dL  299  IgM (Immunoglobulin M), Srm 15 - 143 mg/dL  869  IgA 61 - 562 mg/dL  638  (L): Data is abnormally low (H): Data is abnormally high (C): Corrected   Latest Reference Range & Units 09/05/23 11:58  Kappa free light chain 3.3 - 19.4 mg/L 38.8 (H)  Lambda free light chains 5.7 - 26.3 mg/L 45.1 (H)  Kappa, lambda light chain ratio 0.26 - 1.65  0.86  (H): Data is abnormally high   BCR-ABL.  Quantitative PCR: P210 positive, IS %: 177.85 Flow cytometry:Bone marrow: 94% abnormal granulocytes, 1.1% abnormal monocytes, negative for a discrete population of plasma  cells

## 2024-01-09 ENCOUNTER — Other Ambulatory Visit (HOSPITAL_COMMUNITY): Payer: Self-pay | Admitting: Family Medicine

## 2024-01-09 ENCOUNTER — Inpatient Hospital Stay (HOSPITAL_BASED_OUTPATIENT_CLINIC_OR_DEPARTMENT_OTHER): Admitting: Oncology

## 2024-01-09 VITALS — BP 90/60 | HR 58 | Temp 97.9°F | Resp 18 | Wt 169.8 lb

## 2024-01-09 DIAGNOSIS — D649 Anemia, unspecified: Secondary | ICD-10-CM

## 2024-01-09 DIAGNOSIS — I480 Paroxysmal atrial fibrillation: Secondary | ICD-10-CM

## 2024-01-09 DIAGNOSIS — C9210 Chronic myeloid leukemia, BCR/ABL-positive, not having achieved remission: Secondary | ICD-10-CM

## 2024-01-09 DIAGNOSIS — D472 Monoclonal gammopathy: Secondary | ICD-10-CM | POA: Diagnosis not present

## 2024-01-09 DIAGNOSIS — D509 Iron deficiency anemia, unspecified: Secondary | ICD-10-CM | POA: Diagnosis not present

## 2024-01-09 DIAGNOSIS — T8130XA Disruption of wound, unspecified, initial encounter: Secondary | ICD-10-CM | POA: Diagnosis not present

## 2024-01-09 MED ORDER — POVIDONE-IODINE 10 % EX SOLN: 1.0000 | Freq: Two times a day (BID) | CUTANEOUS | 2 refills | Status: DC | PRN

## 2024-01-09 NOTE — Assessment & Plan Note (Signed)
 Patient has left arm wound dehiscence from a recent fall. Currently not infected.  Bleeding under control.  - Recommended to apply Betadine  twice daily, keep the wound clean and covered with a gauze - Will refer to wound clinic for proper wound care

## 2024-01-09 NOTE — Assessment & Plan Note (Addendum)
 Previous labs showed iron deficiency anemia. S/p IV iron  last on 12/17/23  - Will repeat iron labs with next blood draw

## 2024-01-09 NOTE — Progress Notes (Signed)
 [facility] at [facility]  HEMATOLOGY FOLLOW-UP VISIT  Alexander Norleen PEDLAR, MD  REASON FOR FOLLOW-UP: CML  ASSESSMENT & PLAN:  Patient is a 83 y.o. 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   High risk based on Sokol score BCR-ABL.  Quantitative PCR: P210 positive, IS %: 177.85 Flow cytometry:Bone marrow: 94% abnormal granulocytes, 1.1% abnormal monocytes, negative for a discrete population of plasma cells Ultrasound abdomen showed a spleen size of 14 cm ABL kinase mutation testing: Negative Started on asciminib 40 mg twice daily 09/2023 Improvement in BCR-ABL indicating treatment response  - Continue Asciminib 40 mg twice daily.  Tolerating well with no complaints but has baseline fatigue - Labs reviewed today ANC greater than thousand, platelets greater than 50.  Will hold asciminib for platelets less than 50 in future. -BCR-ABL with good response.  Return to clinic in 1 month with labs 10 days prior to that Anemia, unspecified type Previous labs showed iron deficiency anemia. S/p IV iron  last on 12/17/23  - Will repeat iron labs with next blood draw  Wound dehiscence Patient has left arm wound dehiscence from a recent fall. Currently not infected.  Bleeding under control.  - Recommended to apply Betadine  twice daily, keep the wound clean and covered with a gauze - Will refer to wound clinic for proper wound care MGUS (monoclonal gammopathy of unknown significance) Patient has a history of MGUS with an M spike of 0.3 in 2021. Recent SPEP with no M spike and slightly elevated free light chains with a normal FLC ratio  - No further workup needed at this time PAF (paroxysmal atrial fibrillation) (HCC) Patient has a history of A-fib and is currently on Eliquis   - Continue Eliquis  for now, will hold it when platelets are less than  50   The total time spent in the appointment was 30 minutes encounter with patients including review of chart and various tests results, discussions about plan of care and coordination of care plan   All questions were answered. The patient knows to call the clinic with any problems, questions or concerns. No barriers to learning was detected.   Alexander Burton Alexander Burton,acting as a Neurosurgeon for Alexander Dry, MD.,have documented all relevant documentation on the behalf of Alexander Dry, MD,as directed by  Alexander Dry, MD while in the presence of Alexander Dry, MD.  I, Alexander Dry MD, have reviewed the above documentation for accuracy and completeness, and I agree with the above.    Alexander Dry, MD 8/14/202510:52 PM   Orders Placed This Encounter  Procedures   CBC with Differential   Comprehensive metabolic panel   BCR-ABL1, CML/ALL, PCR, QUANT   Lactate dehydrogenase   Ferritin   Folate   Vitamin B12   Iron and TIBC    HEMATOLOGY HISTORY: 09/05/2023: Ultrasound abdomen: Spleen size: 14 cm 09/09/2023:  -BCR-ABL.  Quantitative PCR: P210 positive, IS %: 177.85 -Flow cytometry:Bone marrow: 94% abnormal granulocytes, 1.1% abnormal monocytes, negative for a discrete population of plasma cells -Bone marrow biopsy pathology: CML-chronic phase -ABL kinase mutations not detected -JAK2 mutation: Not detected 10/03/2023: Started on asciminib 40 mg twice daily   INTERVAL HISTORY: Alexander Burton 83 y.o. male following for CML. He is accompanied by his wife today.   He reports balance issues. Alexander Burton fell 3 days ago, which caused bleeding and swelling to the left elbow. He  denies any head injury or hitting his head after falling. His wife states his left elbow injury has not coagulated or stopped bleeding for the past 3 days. His wife changes his dressings for this injury twice daily.   His fatigue has not improved since he received iron on 12/17/2023, though it has mildly improved  since starting Scemblix  40 mg BID. 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 He is unable to help his son mow his fields, though he is able to mow his lawns. Alexander Burton reports tingling in the feet, which is chronic and present before starting scemblix . He denies any joint pains, rash, headaches, or abdominal pains due to scemblix .   His appetite is not well. He has on and off sleep disturbance and is taking mirtazapine 7.5 mg at bedtime.   I have reviewed the past medical history, past surgical history, social history and family history with the patient   ALLERGIES:  is allergic to contrast media [iodinated contrast media] and metrizamide.  MEDICATIONS:  Current Outpatient Medications  Medication Sig Dispense Refill   amiodarone  (PACERONE ) 200 MG tablet Take 1 tablet (200 mg total) by mouth daily. 100 tablet 0   amoxicillin  (AMOXIL ) 500 MG capsule Take 1,000 mg by mouth 2 (two) times daily.     apixaban  (ELIQUIS ) 5 MG TABS tablet Take 1 tablet (5 mg total) by mouth every 12 (twelve) hours. 60 tablet 6   asciminib hcl (SCEMBLIX ) 40 MG tablet Take 1 tablet (40 mg total) by mouth 2 (two) times daily. Take on empty stomach, at least one hour before or two hours after food. 60 tablet 2   aspirin  EC 325 MG tablet Take 325 mg by mouth every 6 (six) hours as needed for moderate pain (pain score 4-6).     finasteride  (PROSCAR ) 5 MG tablet Take 5 mg by mouth in the morning.     furosemide  (LASIX ) 20 MG tablet Take 0.5 tablets (10 mg total) by mouth every other day. 60 tablet 1   JARDIANCE  10 MG TABS tablet TAKE 1 TABLET(10 MG) BY MOUTH DAILY BEFORE BREAKFAST 30 tablet 5   metoprolol  succinate (TOPROL -XL) 25 MG 24 hr tablet Take 1 tablet (25 mg total) by mouth daily. 90 tablet 3   mirtazapine (REMERON) 7.5 MG tablet Take 7.5 mg by mouth at bedtime.     Multiple Vitamin (MULITIVITAMIN WITH MINERALS) TABS Take 1 tablet by mouth in the morning. Centrum Silver men 50+      Multiple Vitamins-Minerals (PRESERVISION AREDS) CAPS Take 1 capsule by mouth in the morning and at bedtime.      nitroGLYCERIN (NITROSTAT) 0.4 MG SL tablet Place 0.4 mg under the tongue every 5 (five) minutes as needed for chest pain.     ondansetron  (ZOFRAN ) 8 MG tablet Take 1 tablet (8 mg total) by mouth every 8 (eight) hours as needed for nausea. 30 tablet 3   ondansetron  (ZOFRAN -ODT) 8 MG disintegrating tablet Place 1 tablet 3 times a day by translingual route before meal(s).     Oxymetazoline HCl (AFRIN NASAL SPRAY NA) Place 2 sprays into the nose 2 (two) times daily as needed (congestion).     pantoprazole (PROTONIX) 40 MG tablet Take 40 mg by mouth at bedtime.      Polyvinyl Alcohol-Povidone PF (REFRESH) 1.4-0.6 % SOLN Place 1-2 drops into both eyes 3 (three) times daily as needed (Burton/irritated eyes).     potassium chloride  (KLOR-CON  M) 10 MEQ tablet TAKE  1 TABLET BY MOUTH every other day with furosemide      potassium chloride  (MICRO-K ) 10 MEQ CR capsule Take 10 mEq by mouth See admin instructions. Every 3rd day     povidone-iodine  (BETADINE ) 10 % external solution Apply 1 Application topically 2 (two) times daily as needed for wound care. 480 mL 2   pravastatin  (PRAVACHOL ) 20 MG tablet TAKE 1 TABLET(20 MG) BY MOUTH DAILY 90 tablet 3   Probiotic Product (PROBIOTIC DAILY PO) Take 1 tablet by mouth in the morning.     tamsulosin  (FLOMAX ) 0.4 MG CAPS capsule Take 0.4 mg by mouth in the morning and at bedtime.   3   traMADol (ULTRAM) 50 MG tablet Take 50 mg by mouth every 4 (four) hours as needed.     ursodiol  (ACTIGALL ) 250 MG tablet Take 1 tablet (250 mg total) by mouth in the morning and at bedtime. 120 tablet 5   No current facility-administered medications for this visit.     REVIEW OF SYSTEMS:   Constitutional: Denies fevers, chills or night sweats Eyes: Denies blurriness of vision Ears, nose, mouth, throat, and face:  Denies mucositis or sore throat Respiratory: Positive for  dyspnea, Denies wheezes Cardiovascular: Denies palpitation, chest discomfort or lower extremity swelling Gastrointestinal:  Positive for nausea, Denies heartburn or change in bowel habits Skin: Denies abnormal skin rashes Lymphatics: Denies new lymphadenopathy or easy bruising Neurological: Positive for tingling in feet, Positive for balance issues, Denies numbness or new weaknesses Behavioral/Psych: Mood is stable, no new changes  All other systems were reviewed with the patient and are negative.  PHYSICAL EXAMINATION:   Vitals:   01/09/24 1439  BP: 90/60  Pulse: (!) 58  Resp: 18  Temp: 97.9 F (36.6 C)  SpO2: 99%      GENERAL:alert, no distress and comfortable SKIN: skin color, texture, turgor are normal, no rashes or significant lesions LUNGS: clear to auscultation and percussion with normal breathing effort HEART: 1+ bilateral lower extremity edema extending up to the knees, regular rate & rhythm and no murmurs  ABDOMEN:abdomen soft, non-tender and normal bowel sounds Musculoskeletal:Left arm wound dehiscence near the elbow with some swelling at the elbow. Bleeding spots on the dressing. No signs of infection.  NEURO: alert & oriented x 3 with fluent speech  LABORATORY DATA:  I have reviewed the data as listed and also results reviewed from recent admission at [facility]  Lab Results  Component Value Date   WBC 5.4 12/30/2023   NEUTROABS 4.3 12/30/2023   HGB 12.8 (L) 12/30/2023   HCT 39.9 12/30/2023   MCV 94.8 12/30/2023   PLT 71 (L) 12/30/2023       Chemistry      Component Value Date/Time   NA 140 12/30/2023 1212   NA 142 06/23/2014 1122   K 4.2 12/30/2023 1212   K 4.5 06/23/2014 1122   CL 108 12/30/2023 1212   CL 109 (H) 11/19/2012 1044   CO2 24 12/30/2023 1212   CO2 25 06/23/2014 1122   BUN 17 12/30/2023 1212   BUN 15.2 06/23/2014 1122   CREATININE 1.21 12/30/2023 1212   CREATININE 1.70 (H) 07/29/2023 1129   CREATININE 1.0 06/23/2014 1122       Component Value Date/Time   CALCIUM 7.8 (L) 12/30/2023 1212   CALCIUM 8.4 06/23/2014 1122   ALKPHOS 132 (H) 12/30/2023 1212   ALKPHOS 122 06/23/2014 1122   AST 22 12/30/2023 1212   AST 20 06/23/2014 1122   ALT 19 12/30/2023 1212  ALT 17 06/23/2014 1122   BILITOT 0.8 12/30/2023 1212   BILITOT 1.11 06/23/2014 1122       Latest Reference Range & Units 10/03/23 10:01 10/31/23 14:03 11/28/23 13:46 12/30/23 12:12  b2a2 transcript % 20.5240 16.6221 6.8802 0.0734  b3a2 transcript % 19.2253 25.5651 5.9562 0.1039  E1A2 Transcript % 0.0250 0.0416 0.0169 <0.0032  Interpretation (BCRAL):  Positive ! Positive ! Positive ! Positive !  Director Review [facility]):  Comment Comment Comment Comment  References (BCRAL)  Comment Comment Comment Comment  BCR-ABL1, CML/ALL, PCR, QUANT  Rpt ! (C) Rpt ! (C) Rpt ! (C) Rpt ! (C)  !: Data is abnormal (C): Corrected Rpt: View report in Results Review for more information   Latest Reference Range & Units 11/28/23 13:45  Iron 45 - 182 ug/dL 34 (L)  UIBC ug/dL 805  TIBC 749 - 549 ug/dL 771 (L)  Saturation Ratios 17.9 - 39.5 % 15 (L)  Ferritin 24 - 336 ng/mL 83  Folate >5.9 ng/mL 20.7  (L): Data is abnormally low   Latest Reference Range & Units 09/05/23 11:58 09/05/23 11:59  Iron 45 - 182 ug/dL  27 (L)  Total Protein ELP 6.0 - 8.5 g/dL  5.9 (L) (C)  Albumin SerPl Elph-Mcnc 2.9 - 4.4 g/dL  3.0 (C)  Albumin/Glob SerPl 0.7 - 1.7   1.1 (C)  Alpha2 Glob SerPl Elph-Mcnc 0.4 - 1.0 g/dL  0.9 (C)  Alpha 1 0.0 - 0.4 g/dL  0.4 (C)  Gamma Glob SerPl Elph-Mcnc 0.4 - 1.8 g/dL  0.6 (C)  M Protein SerPl Elph-Mcnc Not Observed g/dL  Not Observed (C)  IFE 1   Comment (C)  Globulin, Total 2.2 - 3.9 g/dL  2.9 (C)  B-Globulin SerPl Elph-Mcnc 0.7 - 1.3 g/dL  1.0 (C)  IgG (Immunoglobin G), Serum 603 - 1,613 mg/dL  299  IgM (Immunoglobulin M), Srm 15 - 143 mg/dL  869  IgA 61 - 562 mg/dL  638  (L): Data is abnormally low (H): Data is abnormally high (C): Corrected    Latest Reference Range & Units 09/05/23 11:58  Kappa free light chain 3.3 - 19.4 mg/L 38.8 (H)  Lambda free light chains 5.7 - 26.3 mg/L 45.1 (H)  Kappa, lambda light chain ratio 0.26 - 1.65  0.86  (H): Data is abnormally high   BCR-ABL.  Quantitative PCR: P210 positive, IS %: 177.85 Flow cytometry:Bone marrow: 94% abnormal granulocytes, 1.1% abnormal monocytes, negative for a discrete population of plasma cells

## 2024-01-09 NOTE — Assessment & Plan Note (Signed)
 Ph+ CML in chronic phase on bone marrow biopsy done at [facility].   High risk based on Sokol score BCR-ABL.  Quantitative PCR: P210 positive, IS %: 177.85 Flow cytometry:Bone marrow: 94% abnormal granulocytes, 1.1% abnormal monocytes, negative for a discrete population of plasma cells Ultrasound abdomen showed a spleen size of 14 cm ABL kinase mutation testing: Negative Started on asciminib 40 mg twice daily 09/2023 Improvement in BCR-ABL indicating treatment response  - Continue Asciminib 40 mg twice daily.  Tolerating well with no complaints but has baseline fatigue - Labs reviewed today ANC greater than thousand, platelets greater than 50.  Will hold asciminib for platelets less than 50 in future. -BCR-ABL with good response.  Return to clinic in 1 month with labs 10 days prior to that

## 2024-01-09 NOTE — Assessment & Plan Note (Addendum)
 Patient has a history of MGUS with an M spike of 0.3 in 2021. Recent SPEP with no M spike and slightly elevated free light chains with a normal FLC ratio  - No further workup needed at this time

## 2024-01-09 NOTE — Progress Notes (Signed)
 Patient is taking Scemblix  as prescribed. He has not missed any doses and reports no side effects at this time.

## 2024-01-09 NOTE — Assessment & Plan Note (Addendum)
 Patient has a history of A-fib and is currently on Eliquis   - Continue Eliquis  for now, will hold it when platelets are less than 50

## 2024-01-09 NOTE — Patient Instructions (Addendum)
 [facility] at [facility] Discharge Instructions   You were seen and examined today by Dr. Davonna.  She reviewed the results of your lab work which are normal/stable.   We will see you back in one month. We will repeat lab work prior to this visit.    Return as scheduled.    Thank you for choosing  Cancer Center at [facility] to provide your oncology and hematology care.  To afford each patient quality time with our provider, please arrive at least 15 minutes before your scheduled appointment time.   If you have a lab appointment with the Cancer Center please come in thru the Main Entrance and check in at the main information desk.  You need to re-schedule your appointment should you arrive 10 or more minutes late.  We strive to give you quality time with our providers, and arriving late affects you and other patients whose appointments are after yours.  Also, if you no show three or more times for appointments you may be dismissed from the clinic at the providers discretion.     Again, thank you for choosing [facility].  Our hope is that these requests will decrease the amount of time that you wait before being seen by our physicians.       _____________________________________________________________  Should you have questions after your visit to [facility], please contact our office at [phone] and follow the prompts.  Our office hours are 8:00 a.m. and 4:30 p.m. Monday - Friday.  Please note that voicemails left after 4:00 p.m. may not be returned until the following business day.  We are closed weekends and major holidays.  You do have access to a nurse 24-7, just call the main number to the clinic [phone] and do not press any options, hold on the line and a nurse will answer the phone.    For prescription refill requests, have your pharmacy contact our office and allow 72 hours.    Due to Covid, you will  need to wear a mask upon entering the hospital. If you do not have a mask, a mask will be given to you at the Main Entrance upon arrival. For doctor visits, patients may have 1 support person age 85 or older with them. For treatment visits, patients can not have anyone with them due to social distancing guidelines and our immunocompromised population.

## 2024-01-14 ENCOUNTER — Other Ambulatory Visit: Payer: Self-pay

## 2024-01-14 DIAGNOSIS — C9210 Chronic myeloid leukemia, BCR/ABL-positive, not having achieved remission: Secondary | ICD-10-CM | POA: Diagnosis not present

## 2024-01-14 DIAGNOSIS — N401 Enlarged prostate with lower urinary tract symptoms: Secondary | ICD-10-CM | POA: Diagnosis not present

## 2024-01-14 DIAGNOSIS — I5022 Chronic systolic (congestive) heart failure: Secondary | ICD-10-CM | POA: Diagnosis not present

## 2024-01-14 DIAGNOSIS — I10 Essential (primary) hypertension: Secondary | ICD-10-CM | POA: Diagnosis not present

## 2024-01-14 DIAGNOSIS — I480 Paroxysmal atrial fibrillation: Secondary | ICD-10-CM | POA: Diagnosis not present

## 2024-01-14 DIAGNOSIS — R630 Anorexia: Secondary | ICD-10-CM | POA: Diagnosis not present

## 2024-01-14 DIAGNOSIS — K8309 Other cholangitis: Secondary | ICD-10-CM | POA: Diagnosis not present

## 2024-01-14 DIAGNOSIS — S41112A Laceration without foreign body of left upper arm, initial encounter: Secondary | ICD-10-CM | POA: Diagnosis not present

## 2024-01-14 DIAGNOSIS — K219 Gastro-esophageal reflux disease without esophagitis: Secondary | ICD-10-CM | POA: Diagnosis not present

## 2024-01-14 DIAGNOSIS — E782 Mixed hyperlipidemia: Secondary | ICD-10-CM | POA: Diagnosis not present

## 2024-01-14 DIAGNOSIS — D649 Anemia, unspecified: Secondary | ICD-10-CM | POA: Diagnosis not present

## 2024-01-14 NOTE — Progress Notes (Signed)
 Specialty Pharmacy Ongoing Clinical Assessment Note  I spoke with the patient's wife, Alexander Burton. Alexander Burton is a 83 y.o. male who is being followed by the specialty pharmacy service for RxSp Oncology   Patient's specialty medication(s) reviewed today: No data recorded  Missed doses in the last 4 weeks: 4 (was held due to dental work)   Patient/Caregiver did not have any additional questions or concerns.   Therapeutic benefit summary: Patient is achieving benefit   Adverse events/side effects summary: Experienced adverse events/side effects (Fatigue, tolerable at this time.)   Patient's therapy is appropriate to: Continue    Goals Addressed             This Visit's Progress    Achieve or maintain remission   On track    Patient is on track. Patient will maintain adherence.  Per office visit notes from 01/09/24, blood work remains stable.          Follow up: 3 months  Silvano LOISE Dolly Specialty Pharmacist

## 2024-01-24 ENCOUNTER — Inpatient Hospital Stay

## 2024-01-24 ENCOUNTER — Other Ambulatory Visit: Payer: Self-pay

## 2024-01-24 ENCOUNTER — Other Ambulatory Visit

## 2024-01-24 DIAGNOSIS — I480 Paroxysmal atrial fibrillation: Secondary | ICD-10-CM | POA: Diagnosis not present

## 2024-01-24 DIAGNOSIS — I10 Essential (primary) hypertension: Secondary | ICD-10-CM | POA: Diagnosis not present

## 2024-01-24 DIAGNOSIS — E782 Mixed hyperlipidemia: Secondary | ICD-10-CM | POA: Diagnosis not present

## 2024-01-24 DIAGNOSIS — I5022 Chronic systolic (congestive) heart failure: Secondary | ICD-10-CM | POA: Diagnosis not present

## 2024-01-28 ENCOUNTER — Other Ambulatory Visit: Payer: Self-pay

## 2024-01-28 NOTE — Progress Notes (Signed)
 Specialty Pharmacy Refill Coordination Note  Alexander Burton is a 83 y.o. male contacted today regarding refills of specialty medication(s) Asciminib HCl (SCEMBLIX )   Patient requested Delivery   Delivery date: 01/31/24   Verified address: [address] [name] [address]   Medication will be filled on 01/30/24.

## 2024-01-29 ENCOUNTER — Other Ambulatory Visit

## 2024-01-30 ENCOUNTER — Other Ambulatory Visit: Payer: Self-pay

## 2024-01-31 ENCOUNTER — Other Ambulatory Visit: Payer: Self-pay

## 2024-02-10 ENCOUNTER — Other Ambulatory Visit: Payer: Self-pay

## 2024-02-10 DIAGNOSIS — D485 Neoplasm of uncertain behavior of skin: Secondary | ICD-10-CM | POA: Diagnosis not present

## 2024-02-10 DIAGNOSIS — Z85820 Personal history of malignant melanoma of skin: Secondary | ICD-10-CM | POA: Diagnosis not present

## 2024-02-10 DIAGNOSIS — L570 Actinic keratosis: Secondary | ICD-10-CM | POA: Diagnosis not present

## 2024-02-10 DIAGNOSIS — X32XXXD Exposure to sunlight, subsequent encounter: Secondary | ICD-10-CM | POA: Diagnosis not present

## 2024-02-10 DIAGNOSIS — Z08 Encounter for follow-up examination after completed treatment for malignant neoplasm: Secondary | ICD-10-CM | POA: Diagnosis not present

## 2024-02-11 ENCOUNTER — Inpatient Hospital Stay: Attending: Oncology

## 2024-02-11 ENCOUNTER — Other Ambulatory Visit (HOSPITAL_COMMUNITY): Payer: Self-pay | Admitting: Cardiology

## 2024-02-11 DIAGNOSIS — Z23 Encounter for immunization: Secondary | ICD-10-CM | POA: Diagnosis not present

## 2024-02-11 DIAGNOSIS — S8011XA Contusion of right lower leg, initial encounter: Secondary | ICD-10-CM | POA: Diagnosis not present

## 2024-02-11 DIAGNOSIS — R1310 Dysphagia, unspecified: Secondary | ICD-10-CM | POA: Diagnosis not present

## 2024-02-11 DIAGNOSIS — C9210 Chronic myeloid leukemia, BCR/ABL-positive, not having achieved remission: Secondary | ICD-10-CM | POA: Diagnosis not present

## 2024-02-11 DIAGNOSIS — Z87891 Personal history of nicotine dependence: Secondary | ICD-10-CM | POA: Diagnosis not present

## 2024-02-11 DIAGNOSIS — R2689 Other abnormalities of gait and mobility: Secondary | ICD-10-CM | POA: Diagnosis not present

## 2024-02-11 DIAGNOSIS — Z713 Dietary counseling and surveillance: Secondary | ICD-10-CM | POA: Diagnosis not present

## 2024-02-11 DIAGNOSIS — E860 Dehydration: Secondary | ICD-10-CM | POA: Diagnosis present

## 2024-02-11 DIAGNOSIS — D509 Iron deficiency anemia, unspecified: Secondary | ICD-10-CM | POA: Diagnosis not present

## 2024-02-11 DIAGNOSIS — I10 Essential (primary) hypertension: Secondary | ICD-10-CM | POA: Diagnosis not present

## 2024-02-11 DIAGNOSIS — D472 Monoclonal gammopathy: Secondary | ICD-10-CM | POA: Diagnosis not present

## 2024-02-11 DIAGNOSIS — Z79899 Other long term (current) drug therapy: Secondary | ICD-10-CM | POA: Diagnosis not present

## 2024-02-11 DIAGNOSIS — T148XXA Other injury of unspecified body region, initial encounter: Secondary | ICD-10-CM | POA: Diagnosis not present

## 2024-02-11 DIAGNOSIS — R600 Localized edema: Secondary | ICD-10-CM | POA: Diagnosis not present

## 2024-02-11 DIAGNOSIS — D649 Anemia, unspecified: Secondary | ICD-10-CM

## 2024-02-11 DIAGNOSIS — N401 Enlarged prostate with lower urinary tract symptoms: Secondary | ICD-10-CM | POA: Diagnosis not present

## 2024-02-11 LAB — IRON AND TIBC
Iron: 27 ug/dL — ABNORMAL LOW (ref 45–182)
Saturation Ratios: 13 % — ABNORMAL LOW (ref 17.9–39.5)
TIBC: 205 ug/dL — ABNORMAL LOW (ref 250–450)
UIBC: 178 ug/dL

## 2024-02-11 LAB — COMPREHENSIVE METABOLIC PANEL WITH GFR
ALT: 21 U/L (ref 0–44)
AST: 20 U/L (ref 15–41)
Albumin: 3 g/dL — ABNORMAL LOW (ref 3.5–5.0)
Alkaline Phosphatase: 130 U/L — ABNORMAL HIGH (ref 38–126)
Anion gap: 10 (ref 5–15)
BUN: 23 mg/dL (ref 8–23)
CO2: 25 mmol/L (ref 22–32)
Calcium: 8.2 mg/dL — ABNORMAL LOW (ref 8.9–10.3)
Chloride: 106 mmol/L (ref 98–111)
Creatinine, Ser: 1.44 mg/dL — ABNORMAL HIGH (ref 0.61–1.24)
GFR, Estimated: 48 mL/min — ABNORMAL LOW (ref >60)
Glucose, Bld: 104 mg/dL — ABNORMAL HIGH (ref 70–99)
Potassium: 3.7 mmol/L (ref 3.5–5.1)
Sodium: 141 mmol/L (ref 135–145)
Total Bilirubin: 1.2 mg/dL (ref 0.0–1.2)
Total Protein: 6.1 g/dL — ABNORMAL LOW (ref 6.5–8.1)

## 2024-02-11 LAB — CBC WITH DIFFERENTIAL/PLATELET
Abs Immature Granulocytes: 0.01 K/uL (ref 0.00–0.07)
Basophils Absolute: 0 K/uL (ref 0.0–0.1)
Basophils Relative: 0 %
Eosinophils Absolute: 0 K/uL (ref 0.0–0.5)
Eosinophils Relative: 0 %
HCT: 38.6 % — ABNORMAL LOW (ref 39.0–52.0)
Hemoglobin: 12.2 g/dL — ABNORMAL LOW (ref 13.0–17.0)
Immature Granulocytes: 0 %
Lymphocytes Relative: 13 %
Lymphs Abs: 0.7 K/uL (ref 0.7–4.0)
MCH: 30.2 pg (ref 26.0–34.0)
MCHC: 31.6 g/dL (ref 30.0–36.0)
MCV: 95.5 fL (ref 80.0–100.0)
Monocytes Absolute: 0.5 K/uL (ref 0.1–1.0)
Monocytes Relative: 10 %
Neutro Abs: 4.1 K/uL (ref 1.7–7.7)
Neutrophils Relative %: 77 %
Platelets: 77 K/uL — ABNORMAL LOW (ref 150–400)
RBC: 4.04 MIL/uL — ABNORMAL LOW (ref 4.22–5.81)
RDW: 15.9 % — ABNORMAL HIGH (ref 11.5–15.5)
WBC: 5.3 K/uL (ref 4.0–10.5)
nRBC: 0 % (ref 0.0–0.2)

## 2024-02-11 LAB — FERRITIN: Ferritin: 278 ng/mL (ref 24–336)

## 2024-02-11 LAB — VITAMIN B12: Vitamin B-12: 418 pg/mL (ref 180–914)

## 2024-02-11 LAB — LACTATE DEHYDROGENASE: LDH: 101 U/L (ref 98–192)

## 2024-02-11 LAB — FOLATE: Folate: >20 ng/mL (ref >5.9)

## 2024-02-14 DIAGNOSIS — K8309 Other cholangitis: Secondary | ICD-10-CM | POA: Diagnosis not present

## 2024-02-14 DIAGNOSIS — E782 Mixed hyperlipidemia: Secondary | ICD-10-CM | POA: Diagnosis not present

## 2024-02-14 DIAGNOSIS — N401 Enlarged prostate with lower urinary tract symptoms: Secondary | ICD-10-CM | POA: Diagnosis not present

## 2024-02-14 DIAGNOSIS — K219 Gastro-esophageal reflux disease without esophagitis: Secondary | ICD-10-CM | POA: Diagnosis not present

## 2024-02-14 DIAGNOSIS — I10 Essential (primary) hypertension: Secondary | ICD-10-CM | POA: Diagnosis not present

## 2024-02-14 DIAGNOSIS — R630 Anorexia: Secondary | ICD-10-CM | POA: Diagnosis not present

## 2024-02-14 DIAGNOSIS — D649 Anemia, unspecified: Secondary | ICD-10-CM | POA: Diagnosis not present

## 2024-02-14 DIAGNOSIS — C9210 Chronic myeloid leukemia, BCR/ABL-positive, not having achieved remission: Secondary | ICD-10-CM | POA: Diagnosis not present

## 2024-02-14 DIAGNOSIS — S41112A Laceration without foreign body of left upper arm, initial encounter: Secondary | ICD-10-CM | POA: Diagnosis not present

## 2024-02-14 DIAGNOSIS — I480 Paroxysmal atrial fibrillation: Secondary | ICD-10-CM | POA: Diagnosis not present

## 2024-02-14 DIAGNOSIS — I5022 Chronic systolic (congestive) heart failure: Secondary | ICD-10-CM | POA: Diagnosis not present

## 2024-02-15 LAB — BCR-ABL1, CML/ALL, PCR, QUANT
E1A2 Transcript: <0.0032 %
Interpretation (BCRAL):: POSITIVE — AB
b2a2 transcript: 0.0172 %
b3a2 transcript: 0.0448 %

## 2024-02-20 ENCOUNTER — Other Ambulatory Visit: Payer: Self-pay

## 2024-02-20 DIAGNOSIS — H40012 Open angle with borderline findings, low risk, left eye: Secondary | ICD-10-CM | POA: Diagnosis not present

## 2024-02-20 DIAGNOSIS — H47092 Other disorders of optic nerve, not elsewhere classified, left eye: Secondary | ICD-10-CM | POA: Diagnosis not present

## 2024-02-20 DIAGNOSIS — H353122 Nonexudative age-related macular degeneration, left eye, intermediate dry stage: Secondary | ICD-10-CM | POA: Diagnosis not present

## 2024-02-20 DIAGNOSIS — H35341 Macular cyst, hole, or pseudohole, right eye: Secondary | ICD-10-CM | POA: Diagnosis not present

## 2024-02-20 DIAGNOSIS — H353113 Nonexudative age-related macular degeneration, right eye, advanced atrophic without subfoveal involvement: Secondary | ICD-10-CM | POA: Diagnosis not present

## 2024-02-20 DIAGNOSIS — H43823 Vitreomacular adhesion, bilateral: Secondary | ICD-10-CM | POA: Diagnosis not present

## 2024-02-21 ENCOUNTER — Other Ambulatory Visit: Payer: Self-pay

## 2024-02-25 ENCOUNTER — Inpatient Hospital Stay: Admitting: Oncology

## 2024-02-25 ENCOUNTER — Inpatient Hospital Stay

## 2024-02-25 VITALS — BP 113/61 | HR 56 | Temp 98.0°F | Resp 18 | Ht 70.0 in | Wt 157.0 lb

## 2024-02-25 DIAGNOSIS — E860 Dehydration: Secondary | ICD-10-CM | POA: Diagnosis not present

## 2024-02-25 DIAGNOSIS — R1310 Dysphagia, unspecified: Secondary | ICD-10-CM | POA: Diagnosis not present

## 2024-02-25 DIAGNOSIS — D649 Anemia, unspecified: Secondary | ICD-10-CM | POA: Diagnosis not present

## 2024-02-25 DIAGNOSIS — C9210 Chronic myeloid leukemia, BCR/ABL-positive, not having achieved remission: Secondary | ICD-10-CM | POA: Diagnosis not present

## 2024-02-25 DIAGNOSIS — D509 Iron deficiency anemia, unspecified: Secondary | ICD-10-CM | POA: Diagnosis not present

## 2024-02-25 DIAGNOSIS — I5022 Chronic systolic (congestive) heart failure: Secondary | ICD-10-CM | POA: Diagnosis not present

## 2024-02-25 DIAGNOSIS — R634 Abnormal weight loss: Secondary | ICD-10-CM | POA: Diagnosis not present

## 2024-02-25 DIAGNOSIS — N401 Enlarged prostate with lower urinary tract symptoms: Secondary | ICD-10-CM | POA: Diagnosis not present

## 2024-02-25 DIAGNOSIS — R112 Nausea with vomiting, unspecified: Secondary | ICD-10-CM | POA: Diagnosis not present

## 2024-02-25 DIAGNOSIS — E782 Mixed hyperlipidemia: Secondary | ICD-10-CM | POA: Diagnosis not present

## 2024-02-25 DIAGNOSIS — I10 Essential (primary) hypertension: Secondary | ICD-10-CM | POA: Diagnosis not present

## 2024-02-25 MED ORDER — MEGESTROL ACETATE 20 MG PO TABS: 20.0000 mg | ORAL_TABLET | Freq: Every day | ORAL | 2 refills | Status: DC

## 2024-02-25 MED ORDER — SODIUM CHLORIDE 0.9 % IV SOLN: Freq: Once | INTRAVENOUS | Status: AC

## 2024-02-25 NOTE — Progress Notes (Signed)
 [facility] at [facility]  HEMATOLOGY FOLLOW-UP VISIT  Alexander Norleen PEDLAR, MD  REASON FOR FOLLOW-UP: CML  ASSESSMENT & PLAN:  Patient is a 83 y.o. 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  High risk based on Sokol score BCR-ABL.  Quantitative PCR: P210 positive, IS %: 177.85 Flow cytometry:Bone marrow: 94% abnormal granulocytes, 1.1% abnormal monocytes, negative for a discrete population of plasma cells Ultrasound abdomen showed a spleen size of 14 cm ABL kinase mutation testing: Negative Started on asciminib 40 mg twice daily 09/2023   -CML in complete cytogenetic response and hematologic response.  -Continue asciminib 40 mg twice daily  Return to clinic in 2 months with labs.  Iron deficiency anemia Persistent iron deficiency anemia with low iron levels and T sat of 13%, contributing to fatigue. - Will schedule for IV iron supplementation.  Reemphasized side effects including allergic reactions, nausea and vomiting. -Continue oral iron every other day.  Use MiraLAX for constipation  Abnormal weight loss with decreased appetite Significant weight loss and decreased appetite. Consuming Ensure but reports difficulty with appetite. Nutritional intervention needed.  - Prescribed megace to stimulate appetite. - Consult with nutritionist for dietary recommendations.  Dehydration Dehydration likely due to diuretic use, elevated creatinine on labs.  - Administer intravenous fluids. - Discuss diuretic management with cardiologist.  Dysphagia Dysphagia with possible esophageal stricture. Previous esophageal stretching performed, further evaluation needed.  -Patient wants to wait on reaching out to GI at this time  Nausea and vomiting Patient reports significant improvement and is not using Zofran  anymore.   The total  time spent in the appointment was 30 minutes encounter with patients including review of chart and various tests results, discussions about plan of care and coordination of care plan  All questions were answered. The patient knows to call the clinic with any problems, questions or concerns. No barriers to learning was detected.   Alexander Burton,acting as a Neurosurgeon for Mickiel Dry, MD.,have documented all relevant documentation on the behalf of Mickiel Dry, MD,as directed by  Mickiel Dry, MD while in the presence of Mickiel Dry, MD.   I, Mickiel Dry MD, have reviewed the above documentation for accuracy and completeness, and I agree with the above.    Mickiel Dry, MD 9/30/20259:52 PM   Orders Placed This Encounter  Procedures   CBC with Differential/Platelet   Comprehensive metabolic panel with GFR   Ferritin   Folate   Vitamin B12   Iron and TIBC   Lactate dehydrogenase   BCR-ABL1, CML/ALL, PCR, QUANT    HEMATOLOGY HISTORY: 09/05/2023: Ultrasound abdomen: Spleen size: 14 cm 09/09/2023:  -BCR-ABL.  Quantitative PCR: P210 positive, IS %: 177.85 -Flow cytometry:Bone marrow: 94% abnormal granulocytes, 1.1% abnormal monocytes, negative for a discrete population of plasma cells -Bone marrow biopsy pathology: CML-chronic phase -ABL kinase mutations not detected -JAK2 mutation: Not detected 10/03/2023: Started on asciminib 40 mg twice daily   INTERVAL HISTORY: Discussed the use of AI scribe software for clinical note transcription with the patient, who gave verbal consent to proceed.  History of Present Illness Alexander Burton is an 83 year old male with chronic myeloid leukemia who presents for follow up  He experiences persistent fatigue and a lack of energy, with no improvement noted. His wife reports a weight loss of ten pounds since the last visit, describing him as 'getting rather bony.' He is currently  supplementing his nutrition with Ensure.  His wife  is concerned that the diuretic prescribed by his cardiologist may be causing dehydration. His caregiver is concerned about the diuretic's impact and plans to discuss this with the cardiologist. He also has low protein levels and is using protein shakes.   He is undergoing treatment for chronic myeloid leukemia and has shown a good response to the medication, with a complete cytogenetic response noted.  He has difficulty swallowing, which he attributes to a recurring issue with his esophagus that has required stretching procedures in the past. His caregiver mentions that a doctor was preparing to perform another throat stretching procedure before other medical issues arose.  He is scheduled for surgery on his right eye due to a corneal issue, which was postponed because he was not taken off his blood thinner in time. The surgery is now planned for next Wednesday.  I have reviewed the past medical history, past surgical history, social history and family history with the patient   ALLERGIES:  is allergic to contrast media [iodinated contrast media] and metrizamide.  MEDICATIONS:  Current Outpatient Medications  Medication Sig Dispense Refill   amiodarone  (PACERONE ) 200 MG tablet TAKE 1 TABLET(200 MG) BY MOUTH DAILY 100 tablet 0   amoxicillin  (AMOXIL ) 500 MG capsule Take 1,000 mg by mouth 2 (two) times daily. (Patient taking differently: Take 1,000 mg by mouth 2 (two) times daily. Takes before dental procedures)     asciminib hcl (SCEMBLIX ) 40 MG tablet Take 1 tablet (40 mg total) by mouth 2 (two) times daily. Take on empty stomach, at least one hour before or two hours after food. 60 tablet 2   ELIQUIS  2.5 MG TABS tablet Take 2.5 mg by mouth 2 (two) times daily.     finasteride  (PROSCAR ) 5 MG tablet Take 5 mg by mouth in the morning.     furosemide  (LASIX ) 20 MG tablet Take 0.5 tablets (10 mg total) by mouth every other day. 60 tablet 1   JARDIANCE  10 MG TABS tablet TAKE 1 TABLET(10 MG) BY MOUTH  DAILY BEFORE BREAKFAST 30 tablet 5   megestrol (MEGACE) 20 MG tablet Take 1 tablet (20 mg total) by mouth daily. 30 tablet 2   metoprolol  succinate (TOPROL -XL) 25 MG 24 hr tablet Take 1 tablet (25 mg total) by mouth daily. 90 tablet 3   mirtazapine (REMERON) 7.5 MG tablet Take 7.5 mg by mouth at bedtime.     Multiple Vitamin (MULITIVITAMIN WITH MINERALS) TABS Take 1 tablet by mouth in the morning. Centrum Silver men 50+     Multiple Vitamins-Minerals (PRESERVISION AREDS) CAPS Take 1 capsule by mouth in the morning and at bedtime.      ofloxacin (OCUFLOX) 0.3 % ophthalmic solution Place 1 drop into the right eye 4 (four) times daily.     ondansetron  (ZOFRAN ) 8 MG tablet Take 1 tablet (8 mg total) by mouth every 8 (eight) hours as needed for nausea. 30 tablet 3   ondansetron  (ZOFRAN -ODT) 8 MG disintegrating tablet Place 1 tablet 3 times a day by translingual route before meal(s).     Oxymetazoline HCl (AFRIN NASAL SPRAY NA) Place 2 sprays into the nose 2 (two) times daily as needed (congestion).     pantoprazole (PROTONIX) 40 MG tablet Take 40 mg by mouth at bedtime.      Polyvinyl Alcohol-Povidone PF (REFRESH) 1.4-0.6 % SOLN Place 1-2 drops into both eyes 3 (three) times daily as needed (dry/irritated eyes).     potassium chloride  (KLOR-CON   M) 10 MEQ tablet TAKE 1 TABLET BY MOUTH every other day with furosemide      potassium chloride  (MICRO-K ) 10 MEQ CR capsule Take 10 mEq by mouth See admin instructions. Every 3rd day     povidone-iodine  (BETADINE ) 10 % external solution Apply 1 Application topically 2 (two) times daily as needed for wound care. 480 mL 2   pravastatin  (PRAVACHOL ) 20 MG tablet TAKE 1 TABLET(20 MG) BY MOUTH DAILY 90 tablet 3   prednisoLONE acetate (PRED FORTE) 1 % ophthalmic suspension Place 1 drop into the right eye 4 (four) times daily.     Probiotic Product (PROBIOTIC DAILY PO) Take 1 tablet by mouth in the morning.     tamsulosin  (FLOMAX ) 0.4 MG CAPS capsule Take 0.4 mg by mouth  in the morning and at bedtime.   3   traMADol (ULTRAM) 50 MG tablet Take 50 mg by mouth every 4 (four) hours as needed.     ursodiol  (ACTIGALL ) 250 MG tablet Take 1 tablet (250 mg total) by mouth in the morning and at bedtime. 120 tablet 5   nitroGLYCERIN (NITROSTAT) 0.4 MG SL tablet Place 0.4 mg under the tongue every 5 (five) minutes as needed for chest pain. (Patient not taking: Reported on 02/25/2024)     No current facility-administered medications for this visit.     REVIEW OF SYSTEMS:   Constitutional: Denies fevers, chills or night sweats Eyes: Denies blurriness of vision Ears, nose, mouth, throat, and face:  Denies mucositis or sore throat Respiratory: Positive for dyspnea, Denies wheezes Cardiovascular: Denies palpitation, chest discomfort or lower extremity swelling Gastrointestinal: Denies heartburn or change in bowel habits Skin: Denies abnormal skin rashes Lymphatics: Denies new lymphadenopathy or easy bruising Neurological: Positive for tingling in feet, Positive for balance issues, Denies numbness or new weaknesses Behavioral/Psych: Mood is stable, no new changes  All other systems were reviewed with the patient and are negative.  PHYSICAL EXAMINATION:   Vitals:   02/25/24 1430  BP: 113/61  Pulse: (!) 56  Resp: 18  Temp: 98 F (36.7 C)  SpO2: 100%   GENERAL:alert, no distress and comfortable SKIN: skin color, texture, turgor are normal, no rashes or significant lesions LUNGS: clear to auscultation and percussion with normal breathing effort HEART: 1+ bilateral lower extremity edema extending up to the knees, regular rate & rhythm and no murmurs  ABDOMEN:abdomen soft, non-tender and normal bowel sounds Musculoskeletal:Left arm wound dehiscence near the elbow with some swelling at the elbow. Bleeding spots on the dressing. No signs of infection.  NEURO: alert & oriented x 3 with fluent speech  LABORATORY DATA:  I have reviewed the data as listed and also results  reviewed from recent admission at [facility]  Lab Results  Component Value Date   WBC 5.3 02/11/2024   NEUTROABS 4.1 02/11/2024   HGB 12.2 (L) 02/11/2024   HCT 38.6 (L) 02/11/2024   MCV 95.5 02/11/2024   PLT 77 (L) 02/11/2024       Chemistry      Component Value Date/Time   NA 141 02/11/2024 1316   NA 142 06/23/2014 1122   K 3.7 02/11/2024 1316   K 4.5 06/23/2014 1122   CL 106 02/11/2024 1316   CL 109 (H) 11/19/2012 1044   CO2 25 02/11/2024 1316   CO2 25 06/23/2014 1122   BUN 23 02/11/2024 1316   BUN 15.2 06/23/2014 1122   CREATININE 1.44 (H) 02/11/2024 1316   CREATININE 1.70 (H) 07/29/2023 1129   CREATININE 1.0 06/23/2014  1122      Component Value Date/Time   CALCIUM 8.2 (L) 02/11/2024 1316   CALCIUM 8.4 06/23/2014 1122   ALKPHOS 130 (H) 02/11/2024 1316   ALKPHOS 122 06/23/2014 1122   AST 20 02/11/2024 1316   AST 20 06/23/2014 1122   ALT 21 02/11/2024 1316   ALT 17 06/23/2014 1122   BILITOT 1.2 02/11/2024 1316   BILITOT 1.11 06/23/2014 1122       Latest Reference Range & Units 02/11/24 13:16  b2a2 transcript % 0.0172  b3a2 transcript % 0.0448  E1A2 Transcript % <0.0032  Interpretation (BCRAL):  Positive !  Director Review [facility]):  Comment  References (BCRAL)  Comment  BCR-ABL1, CML/ALL, PCR, QUANT  Rpt ! (C)  !: Data is abnormal (C): Corrected Rpt: View report in Results Review for more information    Latest Reference Range & Units 09/05/23 11:58 09/05/23 11:59  Iron 45 - 182 ug/dL  27 (L)  Total Protein ELP 6.0 - 8.5 g/dL  5.9 (L) (C)  Albumin SerPl Elph-Mcnc 2.9 - 4.4 g/dL  3.0 (C)  Albumin/Glob SerPl 0.7 - 1.7   1.1 (C)  Alpha2 Glob SerPl Elph-Mcnc 0.4 - 1.0 g/dL  0.9 (C)  Alpha 1 0.0 - 0.4 g/dL  0.4 (C)  Gamma Glob SerPl Elph-Mcnc 0.4 - 1.8 g/dL  0.6 (C)  M Protein SerPl Elph-Mcnc Not Observed g/dL  Not Observed (C)  IFE 1   Comment (C)  Globulin, Total 2.2 - 3.9 g/dL  2.9 (C)  B-Globulin SerPl Elph-Mcnc 0.7 - 1.3 g/dL  1.0 (C)  IgG (Immunoglobin  G), Serum 603 - 1,613 mg/dL  299  IgM (Immunoglobulin M), Srm 15 - 143 mg/dL  869  IgA 61 - 562 mg/dL  638  (L): Data is abnormally low (H): Data is abnormally high (C): Corrected   Latest Reference Range & Units 09/05/23 11:58  Kappa free light chain 3.3 - 19.4 mg/L 38.8 (H)  Lambda free light chains 5.7 - 26.3 mg/L 45.1 (H)  Kappa, lambda light chain ratio 0.26 - 1.65  0.86  (H): Data is abnormally high   Latest Reference Range & Units 02/11/24 13:16  Iron 45 - 182 ug/dL 27 (L)  UIBC ug/dL 821  TIBC 749 - 549 ug/dL 794 (L)  Saturation Ratios 17.9 - 39.5 % 13 (L)  Ferritin 24 - 336 ng/mL 278  Folate >5.9 ng/mL >20.0  (L): Data is abnormally low  Latest Reference Range & Units 02/11/24 13:16  Vitamin B12 180 - 914 pg/mL 418    Latest Reference Range & Units 02/11/24 13:16  LDH 98 - 192 U/L 101   BCR-ABL.  Quantitative PCR: P210 positive, IS %: 177.85 Flow cytometry:Bone marrow: 94% abnormal granulocytes, 1.1% abnormal monocytes, negative for a discrete population of plasma cells

## 2024-02-25 NOTE — Assessment & Plan Note (Deleted)
 Likely secondary to his Asciminb use  - Prescribe Zofran  to be taken as needed.  Recommended taking it half an hour to an hour before meals so that he can tolerate the meals.

## 2024-02-25 NOTE — Assessment & Plan Note (Deleted)
 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  High risk based on Sokol score BCR-ABL.  Quantitative PCR: P210 positive, IS %: 177.85 Flow cytometry:Bone marrow: 94% abnormal granulocytes, 1.1% abnormal monocytes, negative for a discrete population of plasma cells Ultrasound abdomen showed a spleen size of 14 cm ABL kinase mutation testing: Negative Started on asciminib 40 mg twice daily 09/2023 Improvement in BCR-ABL indicating treatment response  - Continue Asciminib.  Tolerating well with some nausea. - Labs reviewed today ANC greater than thousand, platelets greater than 50.  Will hold asciminib for platelets less than 50 in future. - Recommended patient to obtain a CBC the day before dental procedure and if platelets are low, hold the same day 2 days prior to and 1 day after the procedure due to the risk of bleeding.   Return to clinic in 1 month with labs 10 days prior to that

## 2024-02-25 NOTE — Assessment & Plan Note (Deleted)
 Previous labs showed iron deficiency anemia. S/p IV iron with now improved iron levels and anemia Repeat iron labs with TSAT less than 20 and anemia is persistent  - Will administer 1 more dose of IV iron.  Reemphasized side effects including allergic reactions, nausea and vomiting -Continue oral iron every other day

## 2024-02-27 ENCOUNTER — Other Ambulatory Visit: Payer: Self-pay

## 2024-02-28 ENCOUNTER — Other Ambulatory Visit (HOSPITAL_COMMUNITY): Payer: Self-pay

## 2024-03-02 ENCOUNTER — Other Ambulatory Visit: Payer: Self-pay

## 2024-03-02 NOTE — Progress Notes (Signed)
 Specialty Pharmacy Refill Coordination Note  Alexander Burton is a 83 y.o. male contacted today regarding refills of specialty medication(s) Asciminib HCl (SCEMBLIX )   Patient requested Delivery   Delivery date: 03/05/24   Verified address: [address] [name] [address]   Medication will be filled on 03/04/24.

## 2024-03-03 ENCOUNTER — Other Ambulatory Visit: Payer: Self-pay

## 2024-03-03 ENCOUNTER — Inpatient Hospital Stay: Attending: Oncology

## 2024-03-03 VITALS — BP 102/53 | HR 50 | Temp 97.6°F | Resp 17

## 2024-03-03 DIAGNOSIS — D509 Iron deficiency anemia, unspecified: Secondary | ICD-10-CM

## 2024-03-03 DIAGNOSIS — E860 Dehydration: Secondary | ICD-10-CM | POA: Insufficient documentation

## 2024-03-03 DIAGNOSIS — Z79899 Other long term (current) drug therapy: Secondary | ICD-10-CM | POA: Diagnosis not present

## 2024-03-03 MED ORDER — SODIUM CHLORIDE 0.9 % IV SOLN: INTRAVENOUS | Status: DC

## 2024-03-03 MED ORDER — SODIUM CHLORIDE 0.9 % IV SOLN
1000.0000 mg | Freq: Once | INTRAVENOUS | Status: AC
  Administered 2024-03-03: 1000 mg via INTRAVENOUS
  Filled 2024-03-03: qty 1000

## 2024-03-03 MED ORDER — ACETAMINOPHEN 325 MG PO TABS
650.0000 mg | ORAL_TABLET | Freq: Once | ORAL | Status: AC
  Administered 2024-03-03: 650 mg via ORAL
  Filled 2024-03-03: qty 2

## 2024-03-03 MED ORDER — CETIRIZINE HCL 10 MG/ML IV SOLN
5.0000 mg | Freq: Once | INTRAVENOUS | Status: AC
  Administered 2024-03-03: 5 mg via INTRAVENOUS
  Filled 2024-03-03: qty 1

## 2024-03-03 NOTE — Progress Notes (Signed)
Patient presents today for Monoferric infusion per providers order.  Vital signs WNL.  Patient has no new complaints at this time.    Peripheral IV started and blood return noted pre and post infusion.  Stable during infusion without adverse affects.  Vital signs stable.  No complaints at this time.  Discharge from clinic ambulatory in stable condition.  Alert and oriented X 3.  Follow up with [facility] as scheduled.

## 2024-03-03 NOTE — Patient Instructions (Signed)
 [facility] [place] - A DEPT OF [facility]  Discharge Instructions: Thank you for choosing [facility] to provide your oncology and hematology care.  If you have a lab appointment with the Cancer Center - please note that after April 8th, 2024, all labs will be drawn in the cancer center.  You do not have to check in or register with the main entrance as you have in the past but will complete your check-in in the cancer center.  Wear comfortable clothing and clothing appropriate for easy access to any Portacath or PICC line.   We strive to give you quality time with your provider. You may need to reschedule your appointment if you arrive late (15 or more minutes).  Arriving late affects you and other patients whose appointments are after yours.  Also, if you miss three or more appointments without notifying the office, you may be dismissed from the clinic at the provider's discretion.      For prescription refill requests, have your pharmacy contact our office and allow 72 hours for refills to be completed.    Today you received the following chemotherapy and/or immunotherapy agents Monoferric      To help prevent nausea and vomiting after your treatment, we encourage you to take your nausea medication as directed.  BELOW ARE SYMPTOMS THAT SHOULD BE REPORTED IMMEDIATELY: *FEVER GREATER THAN 100.4 F (38 C) OR HIGHER *CHILLS OR SWEATING *NAUSEA AND VOMITING THAT IS NOT CONTROLLED WITH YOUR NAUSEA MEDICATION *UNUSUAL SHORTNESS OF BREATH *UNUSUAL BRUISING OR BLEEDING *URINARY PROBLEMS (pain or burning when urinating, or frequent urination) *BOWEL PROBLEMS (unusual diarrhea, constipation, pain near the anus) TENDERNESS IN MOUTH AND THROAT WITH OR WITHOUT PRESENCE OF ULCERS (sore throat, sores in mouth, or a toothache) UNUSUAL RASH, SWELLING OR PAIN  UNUSUAL VAGINAL DISCHARGE OR ITCHING   Items with * indicate a potential emergency and should be followed  up as soon as possible or go to the Emergency Department if any problems should occur.  Please show the CHEMOTHERAPY ALERT CARD or IMMUNOTHERAPY ALERT CARD at check-in to the Emergency Department and triage nurse.  Should you have questions after your visit or need to cancel or reschedule your appointment, please contact [facility] [place] - A DEPT OF Eligha Brideg[facility] [phone]  and follow the prompts.  Office hours are 8:00 a.m. to 4:30 p.m. Monday - Friday. Please note that voicemails left after 4:00 p.m. may not be returned until the following business day.  We are closed weekends and major holidays. You have access to a nurse at all times for urgent questions. Please call the main number to the clinic [phone] and follow the prompts.  For any non-urgent questions, you may also contact your provider using MyChart. We now offer e-Visits for anyone 54 and older to request care online for non-urgent symptoms. For details visit [email or web address].   Also download the MyChart app! Go to the app store, search "MyChart", open the app, select [place], and log in with your MyChart username and password.

## 2024-03-04 DIAGNOSIS — H35342 Macular cyst, hole, or pseudohole, left eye: Secondary | ICD-10-CM | POA: Diagnosis not present

## 2024-03-04 DIAGNOSIS — H35341 Macular cyst, hole, or pseudohole, right eye: Secondary | ICD-10-CM | POA: Diagnosis not present

## 2024-03-11 DIAGNOSIS — H35341 Macular cyst, hole, or pseudohole, right eye: Secondary | ICD-10-CM | POA: Diagnosis not present

## 2024-03-11 DIAGNOSIS — H43823 Vitreomacular adhesion, bilateral: Secondary | ICD-10-CM | POA: Diagnosis not present

## 2024-03-11 DIAGNOSIS — H353122 Nonexudative age-related macular degeneration, left eye, intermediate dry stage: Secondary | ICD-10-CM | POA: Diagnosis not present

## 2024-03-11 DIAGNOSIS — H353113 Nonexudative age-related macular degeneration, right eye, advanced atrophic without subfoveal involvement: Secondary | ICD-10-CM | POA: Diagnosis not present

## 2024-03-11 DIAGNOSIS — H40012 Open angle with borderline findings, low risk, left eye: Secondary | ICD-10-CM | POA: Diagnosis not present

## 2024-03-12 DIAGNOSIS — X32XXXD Exposure to sunlight, subsequent encounter: Secondary | ICD-10-CM | POA: Diagnosis not present

## 2024-03-12 DIAGNOSIS — L570 Actinic keratosis: Secondary | ICD-10-CM | POA: Diagnosis not present

## 2024-03-23 ENCOUNTER — Encounter: Payer: Self-pay | Admitting: *Deleted

## 2024-03-23 ENCOUNTER — Other Ambulatory Visit: Payer: Self-pay | Admitting: *Deleted

## 2024-03-23 MED ORDER — MEGESTROL ACETATE 40 MG/ML PO SUSP: 200.0000 mg | Freq: Every day | ORAL | 2 refills | Status: AC

## 2024-03-27 ENCOUNTER — Other Ambulatory Visit: Payer: Self-pay

## 2024-03-27 ENCOUNTER — Other Ambulatory Visit: Payer: Self-pay | Admitting: Oncology

## 2024-03-27 ENCOUNTER — Other Ambulatory Visit (HOSPITAL_COMMUNITY): Payer: Self-pay

## 2024-03-27 DIAGNOSIS — I10 Essential (primary) hypertension: Secondary | ICD-10-CM | POA: Diagnosis not present

## 2024-03-27 DIAGNOSIS — E782 Mixed hyperlipidemia: Secondary | ICD-10-CM | POA: Diagnosis not present

## 2024-03-27 DIAGNOSIS — C9210 Chronic myeloid leukemia, BCR/ABL-positive, not having achieved remission: Secondary | ICD-10-CM | POA: Diagnosis not present

## 2024-03-27 DIAGNOSIS — I5022 Chronic systolic (congestive) heart failure: Secondary | ICD-10-CM | POA: Diagnosis not present

## 2024-03-27 MED ORDER — ASCIMINIB HCL 40 MG PO TABS
40.0000 mg | ORAL_TABLET | Freq: Two times a day (BID) | ORAL | 2 refills | Status: DC
  Filled 2024-03-27 – 2024-03-30 (×3): qty 60, 30d supply, fill #0
  Filled 2024-04-24: qty 60, 30d supply, fill #1
  Filled 2024-05-26: qty 60, 30d supply, fill #2

## 2024-03-27 NOTE — Telephone Encounter (Signed)
 Chart reviewed. Scemblix  refilled per last office note with Dr. Davonna.

## 2024-03-30 ENCOUNTER — Other Ambulatory Visit (HOSPITAL_COMMUNITY): Payer: Self-pay

## 2024-03-30 ENCOUNTER — Other Ambulatory Visit: Payer: Self-pay

## 2024-03-31 ENCOUNTER — Other Ambulatory Visit: Payer: Self-pay

## 2024-03-31 NOTE — Progress Notes (Signed)
 Specialty Pharmacy Refill Coordination Note  Alexander Burton is a 83 y.o. male contacted today regarding refills of specialty medication(s) Asciminib HCl (SCEMBLIX )   Patient requested Delivery   Delivery date: 04/03/24   Verified address: [address] [name] [address]   Medication will be filled on: 04/02/24

## 2024-03-31 NOTE — Progress Notes (Signed)
 Specialty Pharmacy Ongoing Clinical Assessment Note  Alexander Burton is a 83 y.o. male who is being followed by the specialty pharmacy service for RxSp Oncology   Patient's specialty medication(s) reviewed today: Asciminib HCl (SCEMBLIX )   Missed doses in the last 4 weeks: 0   Patient/Caregiver did not have any additional questions or concerns.   Therapeutic benefit summary: Patient is achieving benefit   Adverse events/side effects summary: Experienced adverse events/side effects (fatigue and occasional diarrhea, both are tolerable at this time)   Patient's therapy is appropriate to: Continue    Goals Addressed             This Visit's Progress    Achieve or maintain remission   On track    Patient is on track. Patient will maintain adherence.  Per office visit notes from 02/25/24, the patient has had a complete cytogenetic response.         Follow up: 3 months  Silvano LOISE Dolly Specialty Pharmacist

## 2024-04-01 ENCOUNTER — Other Ambulatory Visit: Payer: Self-pay

## 2024-04-01 DIAGNOSIS — I5022 Chronic systolic (congestive) heart failure: Secondary | ICD-10-CM | POA: Diagnosis not present

## 2024-04-01 DIAGNOSIS — R630 Anorexia: Secondary | ICD-10-CM | POA: Diagnosis not present

## 2024-04-01 DIAGNOSIS — G629 Polyneuropathy, unspecified: Secondary | ICD-10-CM | POA: Diagnosis not present

## 2024-04-01 DIAGNOSIS — C9210 Chronic myeloid leukemia, BCR/ABL-positive, not having achieved remission: Secondary | ICD-10-CM | POA: Diagnosis not present

## 2024-04-01 DIAGNOSIS — I480 Paroxysmal atrial fibrillation: Secondary | ICD-10-CM | POA: Diagnosis not present

## 2024-04-01 DIAGNOSIS — K8309 Other cholangitis: Secondary | ICD-10-CM | POA: Diagnosis not present

## 2024-04-01 DIAGNOSIS — D649 Anemia, unspecified: Secondary | ICD-10-CM | POA: Diagnosis not present

## 2024-04-01 DIAGNOSIS — E782 Mixed hyperlipidemia: Secondary | ICD-10-CM | POA: Diagnosis not present

## 2024-04-01 DIAGNOSIS — K219 Gastro-esophageal reflux disease without esophagitis: Secondary | ICD-10-CM | POA: Diagnosis not present

## 2024-04-01 DIAGNOSIS — N401 Enlarged prostate with lower urinary tract symptoms: Secondary | ICD-10-CM | POA: Diagnosis not present

## 2024-04-01 DIAGNOSIS — I10 Essential (primary) hypertension: Secondary | ICD-10-CM | POA: Diagnosis not present

## 2024-04-02 ENCOUNTER — Encounter (INDEPENDENT_AMBULATORY_CARE_PROVIDER_SITE_OTHER): Payer: Self-pay | Admitting: Gastroenterology

## 2024-04-09 ENCOUNTER — Other Ambulatory Visit (HOSPITAL_COMMUNITY): Payer: Self-pay

## 2024-04-14 DIAGNOSIS — J069 Acute upper respiratory infection, unspecified: Secondary | ICD-10-CM | POA: Diagnosis not present

## 2024-04-15 DIAGNOSIS — H02831 Dermatochalasis of right upper eyelid: Secondary | ICD-10-CM | POA: Diagnosis not present

## 2024-04-15 DIAGNOSIS — H04123 Dry eye syndrome of bilateral lacrimal glands: Secondary | ICD-10-CM | POA: Diagnosis not present

## 2024-04-15 DIAGNOSIS — Z961 Presence of intraocular lens: Secondary | ICD-10-CM | POA: Diagnosis not present

## 2024-04-15 DIAGNOSIS — H02834 Dermatochalasis of left upper eyelid: Secondary | ICD-10-CM | POA: Diagnosis not present

## 2024-04-15 DIAGNOSIS — H353132 Nonexudative age-related macular degeneration, bilateral, intermediate dry stage: Secondary | ICD-10-CM | POA: Diagnosis not present

## 2024-04-15 DIAGNOSIS — H40012 Open angle with borderline findings, low risk, left eye: Secondary | ICD-10-CM | POA: Diagnosis not present

## 2024-04-20 ENCOUNTER — Inpatient Hospital Stay: Attending: Oncology

## 2024-04-20 DIAGNOSIS — C9210 Chronic myeloid leukemia, BCR/ABL-positive, not having achieved remission: Secondary | ICD-10-CM

## 2024-04-20 LAB — CBC WITH DIFFERENTIAL/PLATELET
Abs Immature Granulocytes: 0.03 K/uL (ref 0.00–0.07)
Basophils Absolute: 0 K/uL (ref 0.0–0.1)
Basophils Relative: 1 %
Eosinophils Absolute: 0.1 K/uL (ref 0.0–0.5)
Eosinophils Relative: 2 %
HCT: 38.2 % — ABNORMAL LOW (ref 39.0–52.0)
Hemoglobin: 12 g/dL — ABNORMAL LOW (ref 13.0–17.0)
Immature Granulocytes: 1 %
Lymphocytes Relative: 14 %
Lymphs Abs: 0.8 K/uL (ref 0.7–4.0)
MCH: 29.3 pg (ref 26.0–34.0)
MCHC: 31.4 g/dL (ref 30.0–36.0)
MCV: 93.4 fL (ref 80.0–100.0)
Monocytes Absolute: 0.5 K/uL (ref 0.1–1.0)
Monocytes Relative: 8 %
Neutro Abs: 4.3 K/uL (ref 1.7–7.7)
Neutrophils Relative %: 74 %
Platelets: 88 K/uL — ABNORMAL LOW (ref 150–400)
RBC: 4.09 MIL/uL — ABNORMAL LOW (ref 4.22–5.81)
RDW: 16.5 % — ABNORMAL HIGH (ref 11.5–15.5)
WBC: 5.7 K/uL (ref 4.0–10.5)
nRBC: 0 % (ref 0.0–0.2)

## 2024-04-20 LAB — COMPREHENSIVE METABOLIC PANEL WITH GFR
ALT: 13 U/L (ref 0–44)
AST: 17 U/L (ref 15–41)
Albumin: 3.7 g/dL (ref 3.5–5.0)
Alkaline Phosphatase: 153 U/L — ABNORMAL HIGH (ref 38–126)
Anion gap: 8 (ref 5–15)
BUN: 17 mg/dL (ref 8–23)
CO2: 24 mmol/L (ref 22–32)
Calcium: 8.4 mg/dL — ABNORMAL LOW (ref 8.9–10.3)
Chloride: 109 mmol/L (ref 98–111)
Creatinine, Ser: 1.15 mg/dL (ref 0.61–1.24)
GFR, Estimated: >60 mL/min (ref >60)
Glucose, Bld: 117 mg/dL — ABNORMAL HIGH (ref 70–99)
Potassium: 4 mmol/L (ref 3.5–5.1)
Sodium: 141 mmol/L (ref 135–145)
Total Bilirubin: 0.8 mg/dL (ref 0.0–1.2)
Total Protein: 6.5 g/dL (ref 6.5–8.1)

## 2024-04-20 LAB — FOLATE: Folate: >20 ng/mL (ref >5.9)

## 2024-04-20 LAB — FERRITIN: Ferritin: 653 ng/mL — ABNORMAL HIGH (ref 24–336)

## 2024-04-20 LAB — IRON AND TIBC
Iron: 43 ug/dL — ABNORMAL LOW (ref 45–182)
Saturation Ratios: 22 % (ref 17.9–39.5)
TIBC: 190 ug/dL — ABNORMAL LOW (ref 250–450)
UIBC: 148 ug/dL

## 2024-04-20 LAB — VITAMIN B12: Vitamin B-12: 534 pg/mL (ref 180–914)

## 2024-04-20 LAB — LACTATE DEHYDROGENASE: LDH: 176 U/L (ref 105–235)

## 2024-04-21 ENCOUNTER — Inpatient Hospital Stay

## 2024-04-22 DIAGNOSIS — H40012 Open angle with borderline findings, low risk, left eye: Secondary | ICD-10-CM | POA: Diagnosis not present

## 2024-04-22 DIAGNOSIS — H43822 Vitreomacular adhesion, left eye: Secondary | ICD-10-CM | POA: Diagnosis not present

## 2024-04-22 DIAGNOSIS — H353113 Nonexudative age-related macular degeneration, right eye, advanced atrophic without subfoveal involvement: Secondary | ICD-10-CM | POA: Diagnosis not present

## 2024-04-22 DIAGNOSIS — H35341 Macular cyst, hole, or pseudohole, right eye: Secondary | ICD-10-CM | POA: Diagnosis not present

## 2024-04-22 DIAGNOSIS — H353122 Nonexudative age-related macular degeneration, left eye, intermediate dry stage: Secondary | ICD-10-CM | POA: Diagnosis not present

## 2024-04-24 ENCOUNTER — Other Ambulatory Visit: Payer: Self-pay

## 2024-04-24 NOTE — Progress Notes (Signed)
 Specialty Pharmacy Refill Coordination Note  Alexander Burton is a 83 y.o. male contacted today regarding refills of specialty medication(s) Asciminib HCl (SCEMBLIX )   Patient requested Delivery   Delivery date: 04/28/24   Verified address: [address] [name] [address]   Medication will be filled on: 04/27/24

## 2024-04-25 LAB — BCR-ABL1, CML/ALL, PCR, QUANT
E1A2 Transcript: <0.0032 %
Interpretation (BCRAL):: POSITIVE — AB
b2a2 transcript: 0.0751 %
b3a2 transcript: 0.1451 %

## 2024-04-26 DIAGNOSIS — E782 Mixed hyperlipidemia: Secondary | ICD-10-CM | POA: Diagnosis not present

## 2024-04-26 DIAGNOSIS — I5022 Chronic systolic (congestive) heart failure: Secondary | ICD-10-CM | POA: Diagnosis not present

## 2024-04-26 DIAGNOSIS — C9210 Chronic myeloid leukemia, BCR/ABL-positive, not having achieved remission: Secondary | ICD-10-CM | POA: Diagnosis not present

## 2024-04-26 DIAGNOSIS — I10 Essential (primary) hypertension: Secondary | ICD-10-CM | POA: Diagnosis not present

## 2024-04-27 ENCOUNTER — Other Ambulatory Visit: Payer: Self-pay

## 2024-04-28 ENCOUNTER — Inpatient Hospital Stay: Attending: Oncology | Admitting: Oncology

## 2024-04-28 VITALS — BP 124/64 | HR 66 | Temp 98.8°F | Resp 17 | Ht 69.5 in | Wt 159.2 lb

## 2024-04-28 DIAGNOSIS — R5383 Other fatigue: Secondary | ICD-10-CM

## 2024-04-28 DIAGNOSIS — D509 Iron deficiency anemia, unspecified: Secondary | ICD-10-CM | POA: Diagnosis not present

## 2024-04-28 DIAGNOSIS — R634 Abnormal weight loss: Secondary | ICD-10-CM | POA: Diagnosis not present

## 2024-04-28 DIAGNOSIS — C9210 Chronic myeloid leukemia, BCR/ABL-positive, not having achieved remission: Secondary | ICD-10-CM | POA: Insufficient documentation

## 2024-04-28 DIAGNOSIS — R1310 Dysphagia, unspecified: Secondary | ICD-10-CM

## 2024-04-28 NOTE — Progress Notes (Signed)
 [facility] at [facility]  HEMATOLOGY FOLLOW-UP VISIT  Alexander Norleen PEDLAR, MD  REASON FOR FOLLOW-UP: CML  ASSESSMENT & PLAN:  Patient is a 83 y.o. male following for leukocytosis  Assessment and Plan  Chronic myeloid leukemia, BCR/ABL-positive in complete cytogenetic response Diagnosed at [facility].  High risk based on Sokol score BCR-ABL.  Quantitative PCR: P210 positive, IS %: 177.85 Flow cytometry:Bone marrow: 94% abnormal granulocytes, 1.1% abnormal monocytes, negative for a discrete population of plasma cells Ultrasound abdomen showed a spleen size of 14 cm ABL kinase mutation testing: Negative Started on asciminib 40 mg twice daily 09/2023   -CML in major molecular response and complete hematologic response, though slight elevation in transcripts on recent labs.Compliance with medication recommended and stressed. -Continue asciminib 40 mg twice daily. Can dose adjust if fatigue does not improve with physical therapy. Can also consider other second line TKIs.   Return to clinic in 2 months with labs.  Iron deficiency anemia Persistent iron deficiency anemia with low iron levels and TSAT of 13%, contributing to fatigue. - Iron labs improved with IV iron. Hb stable at 12.0.  -Continue oral iron every other day.  Use MiraLAX for constipation  Abnormal weight loss with decreased appetite Significant weight loss and decreased appetite. Consuming Ensure but reports difficulty with appetite. Nutritional intervention needed.  - Prescribed megace  to stimulate appetite. - Consult with nutritionist for dietary recommendations.   Dysphagia Dysphagia with possible esophageal stricture. Previous esophageal stretching performed, further evaluation needed.  -Patient is willing to follow up with GI at this time.   Fatigue Likely multifactorial- deconditioning, side effects of asciminib, age related, iron deficiency  -Recommended physical therapy at this  time to improve deconditioning    The total time spent in the appointment was 20 minutes encounter with patients including review of chart and various tests results, discussions about plan of care and coordination of care plan  All questions were answered. The patient knows to call the clinic with any problems, questions or concerns. No barriers to learning was detected.   Alexander Burton,acting as a neurosurgeon for Mickiel Dry, MD.,have documented all relevant documentation on the behalf of Mickiel Dry, MD,as directed by  Mickiel Dry, MD while in the presence of Mickiel Dry, MD.  I, Mickiel Dry MD, have reviewed the above documentation for accuracy and completeness, and I agree with the above.    Mickiel Dry, MD 12/9/202510:38 AM   No orders of the defined types were placed in this encounter.   HEMATOLOGY HISTORY: 09/05/2023: Ultrasound abdomen: Spleen size: 14 cm 09/09/2023:  -BCR-ABL. Quantitative PCR: P210 positive, IS %: 177.85 -Flow cytometry:Bone marrow: 94% abnormal granulocytes, 1.1% abnormal monocytes, negative for a discrete population of plasma cells -Bone marrow biopsy pathology: CML-chronic phase -ABL kinase mutations not detected -JAK2 mutation: Not detected 10/03/2023: Started on asciminib 40 mg twice daily   INTERVAL HISTORY: Alexander Burton is a 83 y.o. male presenting to the clinic today for follow-up of CML. Patient is accompanied by his wife and daughter today.   He is taking asciminib 40 mg daily BID as prescribed. His wife states he has severe fatigue and sleeps most of the day. He has difficulty being active, though when active he notes being unsteady on his feet when walking as well as weakness in the legs. Alexander Burton does not wish to have physical therapy at this time, though will consider doing so later at home.    Alexander Burton has decreased appetite and  difficulty drinking ensure during the day without cutting out of his meals. His nausea and  vomiting has improved.   He still notes difficulty swallowing and hoarseness. Jermell was reluctant to reach out to his GI for this at his last visit with me, though is agreeable to do so now.   We discussed BCR/ABL results, as well as lab results.   I have reviewed the past medical history, past surgical history, social history and family history with the patient   ALLERGIES:  is allergic to contrast media [iodinated contrast media] and metrizamide.  MEDICATIONS:  Current Outpatient Medications  Medication Sig Dispense Refill   amiodarone  (PACERONE ) 200 MG tablet TAKE 1 TABLET(200 MG) BY MOUTH DAILY 100 tablet 0   amoxicillin  (AMOXIL ) 500 MG capsule Take 1,000 mg by mouth 2 (two) times daily. (Patient taking differently: Take 1,000 mg by mouth 2 (two) times daily. Takes before dental procedures)     asciminib hcl (SCEMBLIX ) 40 MG tablet Take 1 tablet (40 mg total) by mouth 2 (two) times daily. Take on empty stomach, at least one hour before or two hours after food. 60 tablet 2   ELIQUIS  2.5 MG TABS tablet Take 2.5 mg by mouth 2 (two) times daily.     finasteride  (PROSCAR ) 5 MG tablet Take 5 mg by mouth in the morning.     furosemide  (LASIX ) 20 MG tablet Take 0.5 tablets (10 mg total) by mouth every other day. 60 tablet 1   JARDIANCE  10 MG TABS tablet TAKE 1 TABLET(10 MG) BY MOUTH DAILY BEFORE BREAKFAST 30 tablet 5   megestrol  (MEGACE ) 40 MG/ML suspension Take 5 mLs (200 mg total) by mouth daily. 480 mL 2   metoprolol  succinate (TOPROL -XL) 25 MG 24 hr tablet Take 1 tablet (25 mg total) by mouth daily. 90 tablet 3   mirtazapine (REMERON) 7.5 MG tablet Take 7.5 mg by mouth at bedtime.     Multiple Vitamin (MULITIVITAMIN WITH MINERALS) TABS Take 1 tablet by mouth in the morning. Centrum Silver men 50+     Multiple Vitamins-Minerals (PRESERVISION AREDS) CAPS Take 1 capsule by mouth in the morning and at bedtime.      nitroGLYCERIN (NITROSTAT) 0.4 MG SL tablet Place 0.4 mg under the tongue every 5  (five) minutes as needed for chest pain. (Patient not taking: Reported on 02/25/2024)     ofloxacin (OCUFLOX) 0.3 % ophthalmic solution Place 1 drop into the right eye 4 (four) times daily.     ondansetron  (ZOFRAN ) 8 MG tablet Take 1 tablet (8 mg total) by mouth every 8 (eight) hours as needed for nausea. 30 tablet 3   ondansetron  (ZOFRAN -ODT) 8 MG disintegrating tablet Place 1 tablet 3 times a day by translingual route before meal(s).     Oxymetazoline HCl (AFRIN NASAL SPRAY NA) Place 2 sprays into the nose 2 (two) times daily as needed (congestion).     pantoprazole (PROTONIX) 40 MG tablet Take 40 mg by mouth at bedtime.      Polyvinyl Alcohol-Povidone PF (REFRESH) 1.4-0.6 % SOLN Place 1-2 drops into both eyes 3 (three) times daily as needed (dry/irritated eyes).     potassium chloride  (KLOR-CON  M) 10 MEQ tablet TAKE 1 TABLET BY MOUTH every other day with furosemide      potassium chloride  (MICRO-K ) 10 MEQ CR capsule Take 10 mEq by mouth See admin instructions. Every 3rd day     povidone-iodine  (BETADINE ) 10 % external solution Apply 1 Application topically 2 (two) times daily as needed for wound  care. 480 mL 2   pravastatin  (PRAVACHOL ) 20 MG tablet TAKE 1 TABLET(20 MG) BY MOUTH DAILY 90 tablet 3   prednisoLONE acetate (PRED FORTE) 1 % ophthalmic suspension Place 1 drop into the right eye 4 (four) times daily.     Probiotic Product (PROBIOTIC DAILY PO) Take 1 tablet by mouth in the morning.     tamsulosin  (FLOMAX ) 0.4 MG CAPS capsule Take 0.4 mg by mouth in the morning and at bedtime.   3   traMADol (ULTRAM) 50 MG tablet Take 50 mg by mouth every 4 (four) hours as needed.     ursodiol  (ACTIGALL ) 250 MG tablet Take 1 tablet (250 mg total) by mouth in the morning and at bedtime. 120 tablet 5   No current facility-administered medications for this visit.     REVIEW OF SYSTEMS:   Constitutional: Denies fevers, chills or night sweats Eyes: Denies blurriness of vision Ears, nose, mouth, throat, and  face:  Denies mucositis or sore throat Respiratory: Positive for dyspnea, Denies wheezes Cardiovascular: Denies palpitation, chest discomfort or lower extremity swelling Gastrointestinal: Denies heartburn or change in bowel habits Skin: Denies abnormal skin rashes Lymphatics: Denies new lymphadenopathy or easy bruising Neurological: Positive for tingling in feet, Positive for balance issues, Denies numbness or new weaknesses Behavioral/Psych: Mood is stable, no new changes   All other systems were reviewed with the patient and are negative.  PHYSICAL EXAMINATION:   Vitals:   04/28/24 1430  BP: 124/64  Pulse: 66  Resp: 17  Temp: 98.8 F (37.1 C)  SpO2: 98%    GENERAL:alert, no distress and comfortable SKIN: skin color, texture, turgor are normal, no rashes or significant lesions LUNGS: clear to auscultation and percussion with normal breathing effort HEART: Regular rate & rhythm and no murmurs , no pedal edema ABDOMEN:abdomen soft, non-tender and normal bowel sounds Musculoskeletal:Normal  NEURO: alert & oriented x 3 with fluent speech  LABORATORY DATA:  I have reviewed the data as listed and also results reviewed from recent admission at [facility]  Lab Results  Component Value Date   WBC 5.7 04/20/2024   NEUTROABS 4.3 04/20/2024   HGB 12.0 (L) 04/20/2024   HCT 38.2 (L) 04/20/2024   MCV 93.4 04/20/2024   PLT 88 (L) 04/20/2024       Chemistry      Component Value Date/Time   NA 141 04/20/2024 0952   NA 142 06/23/2014 1122   K 4.0 04/20/2024 0952   K 4.5 06/23/2014 1122   CL 109 04/20/2024 0952   CL 109 (H) 11/19/2012 1044   CO2 24 04/20/2024 0952   CO2 25 06/23/2014 1122   BUN 17 04/20/2024 0952   BUN 15.2 06/23/2014 1122   CREATININE 1.15 04/20/2024 0952   CREATININE 1.70 (H) 07/29/2023 1129   CREATININE 1.0 06/23/2014 1122      Component Value Date/Time   CALCIUM 8.4 (L) 04/20/2024 0952   CALCIUM 8.4 06/23/2014 1122   ALKPHOS 153 (H) 04/20/2024 0952    ALKPHOS 122 06/23/2014 1122   AST 17 04/20/2024 0952   AST 20 06/23/2014 1122   ALT 13 04/20/2024 0952   ALT 17 06/23/2014 1122   BILITOT 0.8 04/20/2024 0952   BILITOT 1.11 06/23/2014 1122      Latest Reference Range & Units 04/20/24 09:52  LDH 105 - 235 U/L 176    Latest Reference Range & Units 04/20/24 09:52 04/20/24 09:53  Iron 45 - 182 ug/dL 43 (L)   UIBC ug/dL 851  TIBC 250 - 450 ug/dL 809 (L)   Saturation Ratios 17.9 - 39.5 % 22   Ferritin 24 - 336 ng/mL 653 (H)   Folate >5.9 ng/mL  >20.0  Vitamin B12 180 - 914 pg/mL 534   (L): Data is abnormally low (H): Data is abnormally high   Latest Reference Range & Units 04/20/24 09:52  b2a2 transcript % 0.0751  b3a2 transcript % 0.1451  E1A2 Transcript % <0.0032  Interpretation (BCRAL):  Positive !  Director Review [facility]):  Comment  References (BCRAL)  Comment  BCR-ABL1, CML/ALL, PCR, QUANT  Rpt ! (C)  !: Data is abnormal (C): Corrected Rpt: View report in Results Review for more information  BCR-ABL.  Quantitative PCR: P210 positive, IS %: 177.85 Flow cytometry:Bone marrow: 94% abnormal granulocytes, 1.1% abnormal monocytes, negative for a discrete population of plasma cells

## 2024-04-29 DIAGNOSIS — N401 Enlarged prostate with lower urinary tract symptoms: Secondary | ICD-10-CM | POA: Diagnosis not present

## 2024-04-29 DIAGNOSIS — R630 Anorexia: Secondary | ICD-10-CM | POA: Diagnosis not present

## 2024-04-29 DIAGNOSIS — I10 Essential (primary) hypertension: Secondary | ICD-10-CM | POA: Diagnosis not present

## 2024-04-29 DIAGNOSIS — I5022 Chronic systolic (congestive) heart failure: Secondary | ICD-10-CM | POA: Diagnosis not present

## 2024-04-29 DIAGNOSIS — G629 Polyneuropathy, unspecified: Secondary | ICD-10-CM | POA: Diagnosis not present

## 2024-04-29 DIAGNOSIS — K219 Gastro-esophageal reflux disease without esophagitis: Secondary | ICD-10-CM | POA: Diagnosis not present

## 2024-04-29 DIAGNOSIS — K8309 Other cholangitis: Secondary | ICD-10-CM | POA: Diagnosis not present

## 2024-04-29 DIAGNOSIS — E782 Mixed hyperlipidemia: Secondary | ICD-10-CM | POA: Diagnosis not present

## 2024-04-29 DIAGNOSIS — I480 Paroxysmal atrial fibrillation: Secondary | ICD-10-CM | POA: Diagnosis not present

## 2024-04-29 DIAGNOSIS — D649 Anemia, unspecified: Secondary | ICD-10-CM | POA: Diagnosis not present

## 2024-04-29 DIAGNOSIS — C9210 Chronic myeloid leukemia, BCR/ABL-positive, not having achieved remission: Secondary | ICD-10-CM | POA: Diagnosis not present

## 2024-05-05 ENCOUNTER — Ambulatory Visit: Admitting: Cardiology

## 2024-05-05 ENCOUNTER — Encounter: Payer: Self-pay | Admitting: Oncology

## 2024-05-06 ENCOUNTER — Telehealth (INDEPENDENT_AMBULATORY_CARE_PROVIDER_SITE_OTHER): Payer: Self-pay

## 2024-05-06 DIAGNOSIS — E782 Mixed hyperlipidemia: Secondary | ICD-10-CM | POA: Diagnosis not present

## 2024-05-06 DIAGNOSIS — D649 Anemia, unspecified: Secondary | ICD-10-CM | POA: Diagnosis not present

## 2024-05-06 NOTE — Telephone Encounter (Signed)
 Patient's wife called stating that the patient needs an office visit. Alexander Burton, can you get him something scheduled please?

## 2024-05-07 LAB — LAB REPORT - SCANNED: EGFR: 64

## 2024-05-09 ENCOUNTER — Other Ambulatory Visit (HOSPITAL_COMMUNITY): Payer: Self-pay | Admitting: Cardiology

## 2024-05-12 ENCOUNTER — Encounter: Payer: Self-pay | Admitting: Internal Medicine

## 2024-05-20 ENCOUNTER — Other Ambulatory Visit (HOSPITAL_COMMUNITY): Payer: Self-pay

## 2024-05-26 ENCOUNTER — Other Ambulatory Visit: Payer: Self-pay

## 2024-05-26 NOTE — Progress Notes (Signed)
 Specialty Pharmacy Refill Coordination Note  Alexander Burton is a 83 y.o. male contacted today regarding refills of specialty medication(s) Asciminib HCl (SCEMBLIX )   Patient requested Delivery   Delivery date: 05/29/24   Verified address: [address] [name] [address]   Medication will be filled on: 05/27/24

## 2024-05-27 ENCOUNTER — Other Ambulatory Visit: Payer: Self-pay

## 2024-06-01 ENCOUNTER — Ambulatory Visit (HOSPITAL_COMMUNITY): Admitting: Cardiology

## 2024-06-03 ENCOUNTER — Encounter (INDEPENDENT_AMBULATORY_CARE_PROVIDER_SITE_OTHER): Payer: Self-pay | Admitting: Gastroenterology

## 2024-06-03 ENCOUNTER — Ambulatory Visit (INDEPENDENT_AMBULATORY_CARE_PROVIDER_SITE_OTHER): Admitting: Gastroenterology

## 2024-06-03 VITALS — BP 95/57 | HR 65 | Temp 98.0°F | Ht 69.5 in | Wt 163.7 lb

## 2024-06-03 DIAGNOSIS — K9189 Other postprocedural complications and disorders of digestive system: Secondary | ICD-10-CM | POA: Diagnosis not present

## 2024-06-03 DIAGNOSIS — K8309 Other cholangitis: Secondary | ICD-10-CM

## 2024-06-03 DIAGNOSIS — R634 Abnormal weight loss: Secondary | ICD-10-CM

## 2024-06-03 DIAGNOSIS — R1310 Dysphagia, unspecified: Secondary | ICD-10-CM

## 2024-06-03 DIAGNOSIS — D509 Iron deficiency anemia, unspecified: Secondary | ICD-10-CM

## 2024-06-03 DIAGNOSIS — K831 Obstruction of bile duct: Secondary | ICD-10-CM

## 2024-06-03 DIAGNOSIS — Z860101 Personal history of adenomatous and serrated colon polyps: Secondary | ICD-10-CM

## 2024-06-03 DIAGNOSIS — K219 Gastro-esophageal reflux disease without esophagitis: Secondary | ICD-10-CM

## 2024-06-03 NOTE — Progress Notes (Signed)
 Odie Rauen Faizan Denzil Bristol , M.D. Gastroenterology & Hepatology [facility] [address] [place], [place] [address] Primary Care Physician: Shona Norleen PEDLAR, MD [address] [place] [address]   History of Present Illness: Alexander Burton is a 84 y.o. 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   Patient was last seen by Dr. Eartha 06/2023 .  At that time, the patient was advised to continue ursodiol  250 mg twice a day and rotating antibiotic regimen.  He was advised to proceed with EGD and colonoscopy but patient reported having multiple appointments in the future and held off on proceeding with these procedures.  He was continued on pantoprazole 40 mg every day.    I do see a note from 08/2023 where patient was advised to go to [facility] ER for possible cholangitis with sepsis.  Patient was admitted at [facility] but given benign abdominal exam and imaging without acute change in biliary system he was further treated for sepsis due to pneumonia and not cholangitis.  There was also concern about g amiodarone -induced liver injury, statin-induced liver injury, He had external drain removed in October 2024 by at [facility] to decrease the possibility of affecting the antegrade biliary drainage.   Patient is seen and examined with wife in the clinic.  Reports persistent episode of dysphagia with both solid and liquid mostly with pills.  Although patient is tolerating regular diet.  His  other complaint remains unintentional weight loss  Denies any overt bleeding, nausea vomiting fever chills or hematochezia  Last EGD: 02/11/2020 Presence of stricture at 40 cm which was dilated with an 18 mm balloon, gastropathy, normal duodenum.   Last Colonoscopy: 2013 Examination performed to cecum. 12 x 5 mm Sessile cecal polyp snared following saline injection and Hemoclip applied to polypectomy site. Small polyp ablated via cold biopsy from transverse colon. Multiple diverticula at sigmoid colon.   Path: both polyps are TA.   Recommended repeat colonoscopy in 5 years.  Surgical history; cholecystectomy in 1993 complicated by bile duct injury requiring repair by hepaticojejunostomy c/b recurrent cholangitis and PBD (removed 02/2023), hepatic abscess,   Past Medical History: Past Medical History:  Diagnosis Date   BPH (benign prostatic hyperplasia)    CAD (coronary artery disease)    DES to LAD 06/2008   Colon polyps    COPD (chronic obstructive pulmonary disease) (HCC)    Enlarged prostate    Gastritis    Heart failure with mildly reduced ejection fraction (HFmrEF) (HCC)    Hepatic abscess, chronic[572.0]    Resulting from distant surgery   History of syncope 05/2008   PVC's and14 beats NSVT as well as atrial afib   Hyperlipidemia    Melanoma (HCC)    PAF (paroxysmal atrial fibrillation) (HCC)    Sleep apnea 04/02/2005    Past Surgical History: Past Surgical History:  Procedure Laterality Date   CARDIAC CATHETERIZATION  Feb 2010   post DES to LAD ,3.5 X 33 mm Cypher stent,dilated up to 3.8 mm 80 to90% stenosis btwn first and second diag branc w.moderate disease EF 50%   CARDIOVERSION  N/A 11/19/2022   Procedure: CARDIOVERSION;  Surgeon: Rolan Ezra RAMAN, MD;  Location: [facility] INVASIVE CV LAB;  Service: Cardiovascular;  Laterality: N/A;   CARDIOVERSION N/A 10/08/2023   Procedure: CARDIOVERSION;  Surgeon: Rolan Ezra RAMAN, MD;  Location: [facility] INVASIVE CV LAB;  Service:  Cardiovascular;  Laterality: N/A;   CHOLECYSTECTOMY     Complicated by rupture of common bile duct and other vascular structures. Has now had multiple issues with chronic hepatic abscess. Currently has indwelling drain   COLONOSCOPY  08/16/2011   Procedure: COLONOSCOPY;  Surgeon: Claudis RAYMOND Rivet, MD;  [facility];  Service: Endoscopy;  Laterality: N/A;  1200   ESOPHAGEAL DILATION N/A 01/20/2015   Procedure: ESOPHAGEAL DILATION;  Surgeon: Claudis RAYMOND Rivet, MD;  Location: [facility];  Service: Endoscopy;  Laterality: N/A;   ESOPHAGEAL DILATION N/A 02/11/2020   Procedure: ESOPHAGEAL DILATION;  Surgeon: Rivet Claudis RAYMOND, MD;  Location: [facility];  Service: Endoscopy;  Laterality: N/A;   ESOPHAGOGASTRODUODENOSCOPY N/A 01/20/2015   Procedure: ESOPHAGOGASTRODUODENOSCOPY (EGD);  Surgeon: Claudis RAYMOND Rivet, MD;  [facility];  Service: Endoscopy;  Laterality: N/A;  1200   ESOPHAGOGASTRODUODENOSCOPY N/A 02/11/2020   Procedure: ESOPHAGOGASTRODUODENOSCOPY (EGD);  Surgeon: Rivet Claudis RAYMOND, MD;  [facility];  Service: Endoscopy;  Laterality: N/A;  730   LEA DOPPLER  07/28/2008   NORMAL RGT GROIN DUPLEX DOPPLER   liver stent     LIVER SURGERY  09-03/14   @ [facility]   Melanoma resection      Family History: Family History  Problem Relation Age of Onset   Colon cancer Sister    Heart disease Mother    Emphysema Father     Social History:Tobacco Use History[1] Social History   Substance and Sexual Activity  Alcohol Use Not Currently   Alcohol/week: 1.0 standard drink of alcohol   Types: 1 Standard drinks or equivalent per week   Comment: very rare   Social History   Substance and Sexual Activity  Drug Use No    Allergies: Allergies[2]  Medications: Current Outpatient Medications  Medication Sig Dispense Refill   amiodarone  (PACERONE ) 200 MG tablet TAKE 1 TABLET(200 MG) BY MOUTH DAILY 100 tablet 0   amoxicillin  (AMOXIL ) 500 MG capsule Take 1,000 mg by  mouth 2 (two) times daily.     asciminib hcl (SCEMBLIX ) 40 MG tablet Take 1 tablet (40 mg total) by mouth 2 (two) times daily. Take on empty stomach, at least one hour before or two hours after food. 60 tablet 2   ELIQUIS  2.5 MG TABS tablet Take 2.5 mg by mouth 2 (two) times daily.     finasteride  (PROSCAR ) 5 MG tablet Take 5 mg by mouth in the morning.     furosemide  (LASIX ) 20 MG tablet Take 0.5 tablets (10 mg total) by mouth every other day. 60 tablet 1   JARDIANCE  10 MG TABS tablet TAKE 1 TABLET(10 MG) BY MOUTH DAILY BEFORE BREAKFAST 30 tablet 5   metoprolol  succinate (TOPROL -XL) 25 MG 24 hr tablet Take 1 tablet (25 mg total) by mouth daily. 90 tablet 3   mirtazapine (REMERON) 7.5 MG tablet Take 7.5 mg by mouth at bedtime.     Multiple Vitamin (MULITIVITAMIN WITH MINERALS) TABS Take 1 tablet by mouth in the morning. Centrum Silver men 50+     Multiple Vitamins-Minerals (PRESERVISION AREDS) CAPS Take 1 capsule by mouth in the morning and at bedtime.      nitroGLYCERIN (NITROSTAT) 0.4 MG SL tablet Place  0.4 mg under the tongue every 5 (five) minutes as needed for chest pain.     ofloxacin (OCUFLOX) 0.3 % ophthalmic solution Place 1 drop into the right eye 4 (four) times daily.     ondansetron  (ZOFRAN ) 8 MG tablet Take 1 tablet (8 mg total) by mouth every 8 (eight) hours as needed for nausea. 30 tablet 3   ondansetron  (ZOFRAN -ODT) 8 MG disintegrating tablet Place 1 tablet 3 times a day by translingual route before meal(s).     Oxymetazoline HCl (AFRIN NASAL SPRAY NA) Place 2 sprays into the nose 2 (two) times daily as needed (congestion).     pantoprazole (PROTONIX) 40 MG tablet Take 40 mg by mouth at bedtime.      Polyvinyl Alcohol-Povidone PF (REFRESH) 1.4-0.6 % SOLN Place 1-2 drops into both eyes 3 (three) times daily as needed (dry/irritated eyes).     potassium chloride  (KLOR-CON  M) 10 MEQ tablet TAKE 1 TABLET BY MOUTH every other day with furosemide      potassium chloride  (MICRO-K ) 10 MEQ CR  capsule Take 10 mEq by mouth See admin instructions. Every 3rd day     povidone-iodine  (BETADINE ) 10 % external solution Apply 1 Application topically 2 (two) times daily as needed for wound care. 480 mL 2   pravastatin  (PRAVACHOL ) 20 MG tablet TAKE 1 TABLET(20 MG) BY MOUTH DAILY 90 tablet 3   Probiotic Product (PROBIOTIC DAILY PO) Take 1 tablet by mouth in the morning.     tamsulosin  (FLOMAX ) 0.4 MG CAPS capsule Take 0.4 mg by mouth in the morning and at bedtime.   3   ursodiol  (ACTIGALL ) 250 MG tablet Take 1 tablet (250 mg total) by mouth in the morning and at bedtime. 120 tablet 5   megestrol  (MEGACE ) 40 MG/ML suspension Take 5 mLs (200 mg total) by mouth daily. (Patient not taking: Reported on 06/03/2024) 480 mL 2   prednisoLONE acetate (PRED FORTE) 1 % ophthalmic suspension Place 1 drop into the right eye 4 (four) times daily.     traMADol (ULTRAM) 50 MG tablet Take 50 mg by mouth every 4 (four) hours as needed.     No current facility-administered medications for this visit.    Review of Systems: GENERAL: negative for malaise, night sweats HEENT: No changes in hearing or vision, no nose bleeds or other nasal problems. NECK: Negative for lumps, goiter, pain and significant neck swelling RESPIRATORY: Negative for cough, wheezing CARDIOVASCULAR: Negative for chest pain, leg swelling, palpitations, orthopnea GI: SEE HPI MUSCULOSKELETAL: Negative for joint pain or swelling, back pain, and muscle pain. SKIN: Negative for lesions, rash HEMATOLOGY Negative for prolonged bleeding, bruising easily, and swollen nodes. ENDOCRINE: Negative for cold or heat intolerance, polyuria, polydipsia and goiter. NEURO: negative for tremor, gait imbalance, syncope and seizures. The remainder of the review of systems is noncontributory.   Physical Exam: BP (!) 95/57   Pulse 65   Temp 98 F (36.7 C)   Ht 5' 9.5 (1.765 m)   Wt 163 lb 11.2 oz (74.3 kg)   BMI 23.83 kg/m  GENERAL: The patient is AO x3, in  no acute distress. HEENT: Head is normocephalic and atraumatic. EOMI are intact. Mouth is well hydrated and without lesions. NECK: Supple. No masses LUNGS: Clear to auscultation. No presence of rhonchi/wheezing/rales. Adequate chest expansion HEART: RRR, normal s1 and s2. ABDOMEN: Soft, nontender, no guarding, no peritoneal signs, and nondistended. BS +. No masses.  Imaging/Labs: as above     Latest Ref Rng & Units 04/20/2024  9:52 AM 02/11/2024    1:16 PM 12/30/2023   12:12 PM  CBC  WBC 4.0 - 10.5 K/uL 5.7  5.3  5.4   Hemoglobin 13.0 - 17.0 g/dL 87.9  87.7  87.1   Hematocrit 39.0 - 52.0 % 38.2  38.6  39.9   Platelets 150 - 400 K/uL 88  77  71    Lab Results  Component Value Date   IRON 43 (L) 04/20/2024   TIBC 190 (L) 04/20/2024   FERRITIN 653 (H) 04/20/2024    I personally reviewed and interpreted the available labs, imaging and endoscopic files.  Ct 08/2023 Impression:  1. Mild bilateral urothelial thickening and enhancement which can be seen  in setting of ascending urinary tract infection. Correlate with urinalysis.   2.  Interval percutaneous biliary stent removal, with similar intrahepatic  biliary dilation which is not significantly changed since 01/18/2023.    Impression: Limited evaluation of the liver parenchyma due to overlying  bowel gas.  1.  Status post right hepatectomy without focal hepatic lesion.  2.  Blood flow is in the normal direction in the remaining major hepatic  blood vessels.  3.  Small hypoechoic splenic lesion without internal vascularity, likely  corresponding to benign splenic lesion seen on MR 12/14/2016.  Impression and Plan:  Alexander Burton is a 84 y.o. male with past medical history of BPH, coronary artery disease status post stent placement, COPD, hepatic abscess, melanoma, atrial fibrillation on apixaban  , hyperlipidemia, heart failure with decreased ejection fraction, sleep apnea, CML history of recurrent cholangitis due to  hepaticojejunostomy stricture managed at [facility] with multiple dilations on chronic antibiotic suppression (history of biliary duct injury), who presents for follow up of chronic dysphagia and recurrent cholangitis due to postsurgical recurrent biliary stricture.   #Dysphagia #Unintentional weight loss #Iron deficiency   Patient dysphagia is rather chronic had response to dilation 2021 found to have GE junction stricture I discussed with patient that they have been continued dysphagia and iron deficiency next up would be upper endoscopy with dilation of previously known esophageal stricture and colonoscopy given history of colon polyps and iron deficiency.  This was recommended to ensure there is no underlying GI malignancy.  Patient would like to defer any procedures at this time because he reports he is feeling very tired and do not think he can go through the procedure  We discussed the possibility of proceeding with colonoscopy due to history of colon polyps.  Patient is not interested in colonoscopy at this time given his age.  He will reach us  back if he changes his mind.   -If patient changes his mind he can be scheduled for upper endoscopy with dilation, will need to hold DOAC as per protocol -Continue ursodiol  250 mg twice a day -Continue to follow-up with hematology for CML  - Continue with PPI  All questions were answered.      Jaquae Rieves Faizan Jannat Rosemeyer, MD Gastroenterology and Hepatology [facility]   This chart has been completed using [facility] Dictation software, and while attempts have been made to ensure accuracy , certain words and phrases may not be transcribed as intended      [1]  Social History Tobacco Use  Smoking Status Former   Current packs/day: 0.00   Average packs/day: 1.5 packs/day for 50.0 years (75.0 ttl pk-yrs)   Types: Cigarettes   Start date: 05/28/1957   Quit date: 05/29/2007   Years since quitting: 17.0  Smokeless Tobacco  Never  [  2]  Allergies Allergen Reactions   Contrast Media [Iodinated Contrast Media] Hives    Hives, needs pre meds   Metrizamide Hives    Hives, needs pre meds

## 2024-06-03 NOTE — Patient Instructions (Signed)
 It was very nice to meet you today, as dicussed with will plan for the following :  1) please let us  know if you would like upper endoscopy with dilation of your esophagus

## 2024-06-08 ENCOUNTER — Other Ambulatory Visit (INDEPENDENT_AMBULATORY_CARE_PROVIDER_SITE_OTHER): Payer: Self-pay

## 2024-06-08 DIAGNOSIS — K831 Obstruction of bile duct: Secondary | ICD-10-CM

## 2024-06-08 DIAGNOSIS — K8309 Other cholangitis: Secondary | ICD-10-CM

## 2024-06-08 MED ORDER — URSODIOL 250 MG PO TABS: 250.0000 mg | ORAL_TABLET | Freq: Two times a day (BID) | ORAL | 3 refills | Status: AC

## 2024-06-08 NOTE — Telephone Encounter (Signed)
 [facility] is asking for a 90 day refill to be submitted to them on patient ursodiol . Patient was last seen by Dr. Cinderella, on 06/03/2024.

## 2024-06-10 ENCOUNTER — Ambulatory Visit (HOSPITAL_COMMUNITY)
Admission: RE | Admit: 2024-06-10 | Discharge: 2024-06-10 | Disposition: A | Discharge status: Home / Self Care | Source: Ambulatory Visit | Attending: Cardiology | Admitting: Cardiology

## 2024-06-10 ENCOUNTER — Ambulatory Visit (HOSPITAL_COMMUNITY): Payer: Self-pay | Admitting: Cardiology

## 2024-06-10 ENCOUNTER — Encounter (HOSPITAL_COMMUNITY): Payer: Self-pay | Admitting: Cardiology

## 2024-06-10 VITALS — BP 110/70 | HR 66 | Wt 162.0 lb

## 2024-06-10 DIAGNOSIS — C9210 Chronic myeloid leukemia, BCR/ABL-positive, not having achieved remission: Secondary | ICD-10-CM | POA: Insufficient documentation

## 2024-06-10 DIAGNOSIS — K750 Abscess of liver: Secondary | ICD-10-CM | POA: Diagnosis not present

## 2024-06-10 DIAGNOSIS — Z955 Presence of coronary angioplasty implant and graft: Secondary | ICD-10-CM | POA: Diagnosis not present

## 2024-06-10 DIAGNOSIS — I4892 Unspecified atrial flutter: Secondary | ICD-10-CM | POA: Diagnosis not present

## 2024-06-10 DIAGNOSIS — I5022 Chronic systolic (congestive) heart failure: Secondary | ICD-10-CM | POA: Insufficient documentation

## 2024-06-10 DIAGNOSIS — I4819 Other persistent atrial fibrillation: Secondary | ICD-10-CM | POA: Insufficient documentation

## 2024-06-10 DIAGNOSIS — I4510 Unspecified right bundle-branch block: Secondary | ICD-10-CM | POA: Insufficient documentation

## 2024-06-10 DIAGNOSIS — Z79899 Other long term (current) drug therapy: Secondary | ICD-10-CM | POA: Insufficient documentation

## 2024-06-10 DIAGNOSIS — I2510 Atherosclerotic heart disease of native coronary artery without angina pectoris: Secondary | ICD-10-CM | POA: Diagnosis not present

## 2024-06-10 DIAGNOSIS — Z934 Other artificial openings of gastrointestinal tract status: Secondary | ICD-10-CM | POA: Insufficient documentation

## 2024-06-10 DIAGNOSIS — Z9049 Acquired absence of other specified parts of digestive tract: Secondary | ICD-10-CM | POA: Diagnosis not present

## 2024-06-10 DIAGNOSIS — Z7901 Long term (current) use of anticoagulants: Secondary | ICD-10-CM | POA: Insufficient documentation

## 2024-06-10 DIAGNOSIS — I480 Paroxysmal atrial fibrillation: Secondary | ICD-10-CM

## 2024-06-10 DIAGNOSIS — D7589 Other specified diseases of blood and blood-forming organs: Secondary | ICD-10-CM | POA: Insufficient documentation

## 2024-06-10 DIAGNOSIS — J90 Pleural effusion, not elsewhere classified: Secondary | ICD-10-CM | POA: Insufficient documentation

## 2024-06-10 DIAGNOSIS — K831 Obstruction of bile duct: Secondary | ICD-10-CM | POA: Insufficient documentation

## 2024-06-10 LAB — COMPREHENSIVE METABOLIC PANEL WITH GFR
ALT: 19 U/L (ref 0–44)
AST: 21 U/L (ref 15–41)
Albumin: 3.5 g/dL (ref 3.5–5.0)
Alkaline Phosphatase: 157 U/L — ABNORMAL HIGH (ref 38–126)
Anion gap: 9 (ref 5–15)
BUN: 23 mg/dL (ref 8–23)
CO2: 24 mmol/L (ref 22–32)
Calcium: 8.3 mg/dL — ABNORMAL LOW (ref 8.9–10.3)
Chloride: 109 mmol/L (ref 98–111)
Creatinine, Ser: 1.14 mg/dL (ref 0.61–1.24)
GFR, Estimated: >60 mL/min
Glucose, Bld: 105 mg/dL — ABNORMAL HIGH (ref 70–99)
Potassium: 4.4 mmol/L (ref 3.5–5.1)
Sodium: 143 mmol/L (ref 135–145)
Total Bilirubin: 0.6 mg/dL (ref 0.0–1.2)
Total Protein: 6.2 g/dL — ABNORMAL LOW (ref 6.5–8.1)

## 2024-06-10 LAB — PRO BRAIN NATRIURETIC PEPTIDE: Pro Brain Natriuretic Peptide: 1044 pg/mL — ABNORMAL HIGH

## 2024-06-10 LAB — TSH: TSH: 2.78 u[IU]/mL (ref 0.350–4.500)

## 2024-06-10 MED ORDER — FUROSEMIDE 20 MG PO TABS: 10.0000 mg | ORAL_TABLET | ORAL | 6 refills | Status: AC

## 2024-06-10 MED ORDER — PRAVASTATIN SODIUM 80 MG PO TABS: 80.0000 mg | ORAL_TABLET | Freq: Every day | ORAL | 3 refills | Status: AC

## 2024-06-10 MED ORDER — SPIRONOLACTONE 25 MG PO TABS: 12.5000 mg | ORAL_TABLET | Freq: Every day | ORAL | 3 refills | Status: AC

## 2024-06-10 NOTE — Patient Instructions (Addendum)
 CHANGE Lasix  to 10 mg every other day  CHANGE Pravastatin  to 80 mg daily.  START Spironolactone  12.5 mg ( 1/2 Tab) daily.  Labs done today, your results will be available in MyChart, we will contact you for abnormal readings.  REPEAT blood work in 10 days at [facility].  Your provider has ordered a chest Xray for you.   Your physician has requested that you have an echocardiogram. Echocardiography is a painless test that uses sound waves to create images of your heart. It provides your doctor with information about the size and shape of your heart and how well your hearts chambers and valves are working. This procedure takes approximately one hour. There are no restrictions for this procedure. Please do NOT wear cologne, perfume, aftershave, or lotions (deodorant is allowed). Please arrive 15 minutes prior to your appointment time.  Please note: We ask at that you not bring children with you during ultrasound (echo/ vascular) testing. Due to room size and safety concerns, children are not allowed in the ultrasound rooms during exams. Our front office staff cannot provide observation of children in our lobby area while testing is being conducted. An adult accompanying a patient to their appointment will only be allowed in the ultrasound room at the discretion of the ultrasound technician under special circumstances. We apologize for any inconvenience. YOU WILL BE CALLED TO HAVE THIS TEST ARRANGED.  Your physician recommends that you schedule a follow-up appointment in: 1 month.  If you have any questions or concerns before your next appointment please send us  a message through [place] or call our office at [phone].    TO LEAVE A MESSAGE FOR THE NURSE SELECT OPTION 2, PLEASE LEAVE A MESSAGE INCLUDING: YOUR NAME DATE OF BIRTH CALL BACK NUMBER REASON FOR CALL**this is important as we prioritize the call backs  YOU WILL RECEIVE A CALL BACK THE SAME DAY AS LONG AS YOU CALL BEFORE 4:00  PM  At the [facility], you and your health needs are our priority. As part of our continuing mission to provide you with exceptional heart care, we have created designated Provider Care Teams. These Care Teams include your primary Cardiologist (physician) and Advanced Practice Providers (APPs- Physician Assistants and Nurse Practitioners) who all work together to provide you with the care you need, when you need it.   You may see any of the following providers on your designated Care Team at your next follow up: Dr Toribio Fuel Dr Ezra Shuck Dr. Morene Brownie Greig Mosses, NP Caffie Shed, [place] [facility] [place], [place] Beckey Coe, NP Jordan Lee, NP Ellouise Class, NP Tinnie Redman, PharmD Jaun Bash, PharmD   Please be sure to bring in all your medications bottles to every appointment.    Thank you for choosing [facility]

## 2024-06-10 NOTE — Progress Notes (Signed)
 "  ADVANCED HF CLINIC NOTE  PCP: Shona Norleen PEDLAR, MD Cardiology: Dr. Debera HF Cardiology: Dr. Rolan  Chief complaint: CHF  HPI: 84 y.o. with history of persistent atrial fibrillation, CAD, and CHF. He had a DES placed in the LAD in 2/10.  He has a hepaticojejunostomy post-complicated cholecystectomy with common bile duct rupture and chronic hepatic abscess. He has had recurrent cholangitis and recurrent biliary strictures.  In 2023, he reported worsening dyspnea for several months. Echo in 11/23 showed EF 40-45%, mild LV dilation, mild LVH, mildly decreased RV systolic function, mild MR, mild-moderate TR. He was noted to be in atrial fibrillation since 11/23.  He had not been anticoagulated in the past due to low platelets though recently they have been running in the 80s-100s. He has a history of orthostatic hypotension.    Echo in 5/24 showed EF 35%, global hypokinesis, mild LVH, mild RV enlargement and mildly decreased systolic function, severe biatrial enlargement.   S/p DCCV to NSR in 6/24.    Underwent routine biliary tube exchange at [facility] and had later admitted for SBO in 8/24. Treated with bowel rest and NGT.  Echo 1/25 showed EF improved at 45-50%.  Zio 4/25 showed 100% AF/AFL burden.  Admitted 4/25 at [facility] with CAP. Underwent diagnostic thoracentesis showing exudative effusion with lymphocytic predominance. Hematology consulted, underwent BMBx for further evaluation for hematologic malignancy. Noted to be in atrial flutter during admission.  Discharged home off spiro due to AKI. BMBx showed chronic myelogenous leukemia (CML), BCR::ABL1-positive and Plasma cell dyscrasia, ~5% plasma cells on core biopsy with slight lambda predominance. He is being treated w/ asciminib (potentially cardiotoxic label warning). He has f/u with Dr. Davonna.  He had hospital f/u in our clinic 09/27/23 and reported worsening dyspnea since being back in AFL. He was noted to be euvolemic on exam and by ReDs,  26%. He was subsequently set up for outpatient DCCV. This was done on 10/08/23. He converted back to NSR after 1 attempt.   Echo in 7/25 showed EF 55%, mildly dilated RV with normal systolic function.    Patient returns for followup of CHF.  Weight is down 3 lbs.  He is no longer taking Lasix .  He has been more short of breath for a number of weeks now.  Dyspneic walking to mailbox or walking around the yard, does ok in the house. No chest pain.  No orthopnea/PND.  No ligtheadedness or palpitations.  He is in NSR today.   ECG (personally reviewed): NSR, iRBBB, LAFB  Labs (12/25): LDL 75, K 4.2, creatinine 8.86   PMH: 1. CAD: DES to LAD in 2/10.  - Cardiolite  (5/23): EF 48%, no ischemia/infarction.  2. COPD 3. BPH 4. Hyperlipidemia 5. Melanoma 6. Atrial fibrillation/flutter: Persistent - DCCV to NSR in 6/24  - Atrial flutter with DCCV in 5/25.  7. Chronic thrombocytopenia 8. OSA 9. Chronic dysphagia 10. Biliary system disease: Complicated cholecystectomy with rupture of common bile duct, chronic hepatic abscess after this.   - Hepaticojejunostomy with recurrent cholangitis.  - Recurrent biliary stricture requiring dilations.  11. Chronic HF with mid range EF: Echo (2/18) with EF 55-60%.  Echo (11/23) with EF 40-45%, mild LV dilation, mild LVH, mildly decreased RV systolic function, mild MR, mild-moderate TR.  - Echo (5/24): EF 35%, global hypokinesis, mild LVH, mild RV enlargement and mildly decreased systolic function, severe biatrial enlargement.  - Echo (1/25): EF 45-50%, normal RV - Echo (7/25): EF 55%, mildly dilated RV with normal systolic  function.  12. CML: Diagnosed in 4/25, currently on asciminib twice a day.    SH: Married, [place], nonsmoker, no ETOH.   Family History  Problem Relation Age of Onset   Colon cancer Sister    Heart disease Mother    Emphysema Father    ROS: All systems reviewed and negative except as per HPI.  Current Outpatient  Medications  Medication Sig Dispense Refill   amiodarone  (PACERONE ) 200 MG tablet TAKE 1 TABLET(200 MG) BY MOUTH DAILY 100 tablet 0   asciminib hcl (SCEMBLIX ) 40 MG tablet Take 1 tablet (40 mg total) by mouth 2 (two) times daily. Take on empty stomach, at least one hour before or two hours after food. 60 tablet 2   ELIQUIS  2.5 MG TABS tablet Take 2.5 mg by mouth 2 (two) times daily.     finasteride  (PROSCAR ) 5 MG tablet Take 5 mg by mouth in the morning.     JARDIANCE  10 MG TABS tablet TAKE 1 TABLET(10 MG) BY MOUTH DAILY BEFORE BREAKFAST 30 tablet 5   megestrol  (MEGACE ) 40 MG/ML suspension Take 5 mLs (200 mg total) by mouth daily. (Patient taking differently: Take 200 mg by mouth as needed.) 480 mL 2   metoprolol  succinate (TOPROL -XL) 25 MG 24 hr tablet Take 1 tablet (25 mg total) by mouth daily. 90 tablet 3   mirtazapine (REMERON) 7.5 MG tablet Take 7.5 mg by mouth at bedtime.     Multiple Vitamin (MULITIVITAMIN WITH MINERALS) TABS Take 1 tablet by mouth in the morning. Centrum Silver men 50+     Multiple Vitamins-Minerals (PRESERVISION AREDS) CAPS Take 1 capsule by mouth in the morning and at bedtime.      nitroGLYCERIN (NITROSTAT) 0.4 MG SL tablet Place 0.4 mg under the tongue every 5 (five) minutes as needed for chest pain.     Oxymetazoline HCl (AFRIN NASAL SPRAY NA) Place 2 sprays into the nose 2 (two) times daily as needed (congestion).     pantoprazole (PROTONIX) 40 MG tablet Take 40 mg by mouth at bedtime.      Polyvinyl Alcohol-Povidone PF (REFRESH) 1.4-0.6 % SOLN Place 1-2 drops into both eyes 3 (three) times daily as needed (dry/irritated eyes).     potassium chloride  (KLOR-CON  M) 10 MEQ tablet Take 10 mEq by mouth as needed.     potassium chloride  (MICRO-K ) 10 MEQ CR capsule Take 10 mEq by mouth See admin instructions. Every 3rd day (Patient taking differently: Take 10 mEq by mouth as needed. Every 3rd day)     Probiotic Product (PROBIOTIC DAILY PO) Take 1 tablet by mouth in the  morning.     spironolactone  (ALDACTONE ) 25 MG tablet Take 0.5 tablets (12.5 mg total) by mouth daily. 45 tablet 3   tamsulosin  (FLOMAX ) 0.4 MG CAPS capsule Take 0.4 mg by mouth in the morning and at bedtime.   3   ursodiol  (ACTIGALL ) 250 MG tablet Take 1 tablet (250 mg total) by mouth in the morning and at bedtime. 180 tablet 3   furosemide  (LASIX ) 20 MG tablet Take 0.5 tablets (10 mg total) by mouth every other day. 30 tablet 6   ondansetron  (ZOFRAN ) 8 MG tablet Take 1 tablet (8 mg total) by mouth every 8 (eight) hours as needed for nausea. (Patient not taking: Reported on 06/10/2024) 30 tablet 3   ondansetron  (ZOFRAN -ODT) 8 MG disintegrating tablet Place 1 tablet 3 times a day by translingual route before meal(s). (Patient not taking: Reported on 06/10/2024)  pravastatin  (PRAVACHOL ) 80 MG tablet Take 1 tablet (80 mg total) by mouth daily. 90 tablet 3   No current facility-administered medications for this encounter.   Wt Readings from Last 3 Encounters:  06/10/24 73.5 kg (162 lb)  06/03/24 74.3 kg (163 lb 11.2 oz)  04/28/24 72.2 kg (159 lb 3.2 oz)   BP 110/70   Pulse 66   Wt 73.5 kg (162 lb)   SpO2 97%   BMI 23.58 kg/m   PHYSICAL EXAM: General: NAD Neck: No JVD, no thyromegaly or thyroid  nodule.  Lungs: Decreased breath sounds right lung.  CV: Nondisplaced PMI.  Heart regular S1/S2, no S3/S4, no murmur.  1+ edema 1/2 to knees bilaterally.  No carotid bruit.  Normal pedal pulses.  Abdomen: Soft, nontender, no hepatosplenomegaly, no distention.  Skin: Intact without lesions or rashes.  Neurologic: Alert and oriented x 3.  Psych: Normal affect. Extremities: No clubbing or cyanosis.  HEENT: Normal.   Assessment/Plan: 1. Chronic HF with mid-range EF: Echo (11/23) with EF 40-45%, mild LV dilation, mild LVH, mildly decreased RV systolic function, mild MR, mild-moderate TR.  Cardiolite  in 5/23 with no ischemia or infarction.  Echo in 5/24 with EF 35%, global hypokinesis, mild LVH, mild  RV enlargement and mildly decreased systolic function, severe biatrial enlargement.  The onset of atrial fibrillation may have triggered CHF worsening, now s/p DCCV to NSR in 6/24, required repeat DCCV 5/25 for recurrent Afib, now back in NSR.  He has had no chest pain. Echo (1/25) with EF 45-50%, normal RV.  Echo in 7/25 showed EF back up to 55% with mild RV enlargement and normal RV systolic function.  NYHA class III, symptoms worsened recently.  He has peripheral edema but does not have impressive JVD.  Right pleural effusion may be the cause of worsened dyspnea.  - Continue Jardiance  10 mg daily.  - Continue Toprol  XL 25 mg daily.  - Restart spironolactone  12.5 mg daily.  BMET/BNP today, BMET in 10 days.  - I think he needs to start back on some Lasix . He is reticent as he says it makes him urinate too much.  He will start with Lasix  10 mg every other day.  - I will arrange for repeat echo with worsening symptoms as well as risk for cardio-toxicity with asciminib.  2. Atrial fibrillation/flutter: S/p DCCV in 6/24. Had been in NSR until ~ 08/2023. Zio 4/25 showed 100% AF/AFL burden. Underwent repeat DCCV on 5/25 back to NSR.  He is in NSR today, no palpitations.  - Continue Toprol  XL 25 mg daily.  - Continue Eliquis  5 mg bid.  - Continue amiodarone  200 mg daily. Check LFTs and TSH, he will need a regular eye exam.  3. Recurrent biliary stricture with hepaticojejunostomy: Biliary stent has been removed.  4. CAD: DES to LAD in 2/10.  No chest pain.  Cardiolite  in 5/23 showed no ischemia. No chest pain.  - LDL goal < 55, increase pravastatin  to 80 mg daily with lipids/LFTs in 2 months.  5. Chronic myelogenous leukemia: Bone marrow biopsy showed that patient is BCR::ABL1-positive + Plasma cell dyscrasia, ~5% plasma cells on core biopsy with slight lambda predominance.  He is being treated w/ asciminib (potentially cardiotoxic label warning).  6. Pleural effusion: With decreased breath sounds on right, I  had him get a CXR after office visit today which I reviewed.  This showed a large, suspect partially loculated pleural effusion.  - Arrange for IR-guided thoracentesis, will need to check cytology,  serum and fluid LDH, serum and fluid protein.    Follow up in 1 month with APP.   I spent 41 minutes reviewing records, interviewing/examining patient, and managing orders.   Alexander Shuck, MD 06/10/2024  "

## 2024-06-11 ENCOUNTER — Other Ambulatory Visit (HOSPITAL_COMMUNITY): Payer: Self-pay

## 2024-06-11 DIAGNOSIS — J90 Pleural effusion, not elsewhere classified: Secondary | ICD-10-CM

## 2024-06-15 ENCOUNTER — Ambulatory Visit (HOSPITAL_COMMUNITY)
Admission: RE | Admit: 2024-06-15 | Discharge: 2024-06-15 | Disposition: A | Source: Ambulatory Visit | Attending: Cardiology | Admitting: Cardiology

## 2024-06-15 ENCOUNTER — Other Ambulatory Visit (HOSPITAL_COMMUNITY): Payer: Self-pay | Admitting: Cardiology

## 2024-06-15 DIAGNOSIS — J90 Pleural effusion, not elsewhere classified: Secondary | ICD-10-CM | POA: Diagnosis present

## 2024-06-15 HISTORY — PX: IR THORACENTESIS RIGHT ASP PLEURAL SPACE W/IMG GUIDE: IMG5380

## 2024-06-15 MED ORDER — LIDOCAINE-EPINEPHRINE 1 %-1:100000 IJ SOLN
INTRAMUSCULAR | Status: AC
  Filled 2024-06-15: qty 20

## 2024-06-15 NOTE — Procedures (Addendum)
 PROCEDURE SUMMARY:  Successful image-guided diagnostic and therapeutic right-sided thoracentesis. Yielded 1 liter of serosanguineous fluid. Patient tolerated procedure well. EBL: Zero No immediate complications.  Specimen was sent for labs. Awaiting post procedure CXR   Please see imaging section of Epic for full dictation.  MESHILEM MACHUCA PA-C 06/15/2024 10:55 AM

## 2024-06-16 ENCOUNTER — Ambulatory Visit (HOSPITAL_COMMUNITY): Payer: Self-pay | Admitting: Cardiology

## 2024-06-16 ENCOUNTER — Other Ambulatory Visit: Payer: Self-pay

## 2024-06-16 ENCOUNTER — Other Ambulatory Visit: Payer: Self-pay | Admitting: Oncology

## 2024-06-16 LAB — LD, BODY FLUID (OTHER): LD, Body Fluid: 721 IU/L

## 2024-06-16 LAB — PROTEIN, BODY FLUID (OTHER): Total Protein, Body Fluid Other: 2.8 g/dL

## 2024-06-16 MED ORDER — ASCIMINIB HCL 40 MG PO TABS
40.0000 mg | ORAL_TABLET | Freq: Two times a day (BID) | ORAL | 2 refills | Status: AC
  Filled 2024-06-16: qty 60, 30d supply, fill #0

## 2024-06-16 NOTE — Telephone Encounter (Signed)
 Chart reviewed. Scemblix  refilled per last office note with Dr. Davonna.

## 2024-06-16 NOTE — Progress Notes (Signed)
 Specialty Pharmacy Ongoing Clinical Assessment Note  I spoke with the patient's wife. Alexander Burton is a 84 y.o. male who is being followed by the specialty pharmacy service for RxSp Oncology   Patient's specialty medication(s) reviewed today: Asciminib HCl (SCEMBLIX )   Missed doses in the last 4 weeks: 0   Patient/Caregiver did not have any additional questions or concerns.   Therapeutic benefit summary: Patient is achieving benefit   Adverse events/side effects summary: Experienced adverse events/side effects (fatigue, tolerable at this time (patient also has chronic systolic heart failure which likely also contributes to his fatigue))   Patient's therapy is appropriate to: Continue    Goals Addressed             This Visit's Progress    Achieve or maintain remission   On track    Patient is on track. Patient will maintain adherence.  Per office visit notes from 04/28/24,  CML is in major molecular response and complete hematologic response.         Follow up: 3 months  Silvano LOISE Dolly Specialty Pharmacist  Clinical Intervention Note  Clinical Intervention Notes: Patient started spironolactone  12.5 mg, no DDIs identified with his Scemblix .   No data recorded  Silvano LOISE Dolly Specialty Pharmacist

## 2024-06-16 NOTE — Progress Notes (Signed)
 Specialty Pharmacy Refill Coordination Note  I spoke with the patient's wife. JOSTEN WARMUTH is a 84 y.o. male contacted today regarding refills of specialty medication(s) Asciminib HCl (SCEMBLIX )   Patient requested Delivery   Delivery date: 06/25/24   Verified address: [address] [name] [address]   Medication will be filled on: 06/24/24  This fill date is pending response to refill request from provider. Patient is aware and if they have not received fill by intended date they must follow up with pharmacy.

## 2024-06-17 ENCOUNTER — Ambulatory Visit (HOSPITAL_COMMUNITY): Payer: Self-pay | Admitting: Cardiology

## 2024-06-17 ENCOUNTER — Telehealth (HOSPITAL_COMMUNITY): Payer: Self-pay | Admitting: *Deleted

## 2024-06-17 LAB — CYTOLOGY - NON PAP

## 2024-06-17 NOTE — Telephone Encounter (Signed)
 Called pt per Dr. Rolan with following results:  No malignant cells in pleural fluid.  Patient and wife verbalized understanding of same.

## 2024-06-19 ENCOUNTER — Other Ambulatory Visit (HOSPITAL_COMMUNITY)
Admission: RE | Admit: 2024-06-19 | Discharge: 2024-06-19 | Disposition: A | Source: Ambulatory Visit | Attending: Cardiology | Admitting: Cardiology

## 2024-06-19 DIAGNOSIS — I5022 Chronic systolic (congestive) heart failure: Secondary | ICD-10-CM | POA: Insufficient documentation

## 2024-06-19 LAB — BASIC METABOLIC PANEL WITH GFR
Anion gap: 10 (ref 5–15)
BUN: 17 mg/dL (ref 8–23)
CO2: 25 mmol/L (ref 22–32)
Calcium: 8.3 mg/dL — ABNORMAL LOW (ref 8.9–10.3)
Chloride: 106 mmol/L (ref 98–111)
Creatinine, Ser: 1.27 mg/dL — ABNORMAL HIGH (ref 0.61–1.24)
GFR, Estimated: 55 mL/min — ABNORMAL LOW
Glucose, Bld: 109 mg/dL — ABNORMAL HIGH (ref 70–99)
Potassium: 4.1 mmol/L (ref 3.5–5.1)
Sodium: 141 mmol/L (ref 135–145)

## 2024-06-24 ENCOUNTER — Telehealth: Payer: Self-pay | Admitting: Pharmacy Technician

## 2024-06-24 ENCOUNTER — Other Ambulatory Visit: Payer: Self-pay | Admitting: Pharmacy Technician

## 2024-06-24 ENCOUNTER — Other Ambulatory Visit: Payer: Self-pay

## 2024-06-24 ENCOUNTER — Other Ambulatory Visit (HOSPITAL_COMMUNITY): Payer: Self-pay

## 2024-06-24 NOTE — Telephone Encounter (Signed)
 Oral Oncology Patient Advocate Encounter  Patient has decided to pay out of pocket.   CC # provided to [facility].  Atlantis Delong ([name]) Alexander Burton, CPhT  [facility], Alexander Burton, [facility] - Oral Chemotherapy Patient Advocate Specialist III Phone: [phone]  Fax: [phone]

## 2024-06-24 NOTE — Progress Notes (Signed)
 Oral Oncology Patient Advocate Encounter  Patient has decided to pay out of pocket.   CC information provided to [place].  Alexander Burton ([name]) [name], CPhT  [facility], [name], [facility] - Oral Chemotherapy Patient Advocate Specialist III Phone: [phone]  Fax: [phone]

## 2024-06-24 NOTE — Telephone Encounter (Signed)
 Oral Oncology Patient Advocate Encounter  Called to discuss medication copay and options.  Alexander Burton (Alexander) [name], CPhT  [facility], [name], [facility] - Oral Chemotherapy Patient Advocate Specialist III Phone: [phone]  Fax: [phone]

## 2024-06-24 NOTE — Telephone Encounter (Signed)
 Oral Oncology Patient Advocate Encounter  Patient's wife called back and stated that the patient will sign up for the medicare prescription payment plan.  Patient and his wife know to call me back once this has been done.  Alexander Burton ([name]) [name], CPhT  [facility], [name], [facility] - Oral Chemotherapy Patient Advocate Specialist III Phone: [phone]  Fax: [phone]

## 2024-06-26 ENCOUNTER — Other Ambulatory Visit: Payer: Self-pay

## 2024-06-26 DIAGNOSIS — C9210 Chronic myeloid leukemia, BCR/ABL-positive, not having achieved remission: Secondary | ICD-10-CM

## 2024-06-26 DIAGNOSIS — D509 Iron deficiency anemia, unspecified: Secondary | ICD-10-CM

## 2024-06-29 ENCOUNTER — Inpatient Hospital Stay

## 2024-07-02 ENCOUNTER — Inpatient Hospital Stay: Attending: Oncology

## 2024-07-02 DIAGNOSIS — D509 Iron deficiency anemia, unspecified: Secondary | ICD-10-CM

## 2024-07-02 DIAGNOSIS — C9210 Chronic myeloid leukemia, BCR/ABL-positive, not having achieved remission: Secondary | ICD-10-CM

## 2024-07-02 LAB — CBC WITH DIFFERENTIAL/PLATELET
Abs Immature Granulocytes: 0.03 10*3/uL (ref 0.00–0.07)
Basophils Absolute: 0 10*3/uL (ref 0.0–0.1)
Basophils Relative: 0 %
Eosinophils Absolute: 0.1 10*3/uL (ref 0.0–0.5)
Eosinophils Relative: 1 %
HCT: 40 % (ref 39.0–52.0)
Hemoglobin: 12.7 g/dL — ABNORMAL LOW (ref 13.0–17.0)
Immature Granulocytes: 0 %
Lymphocytes Relative: 13 %
Lymphs Abs: 0.9 10*3/uL (ref 0.7–4.0)
MCH: 30.2 pg (ref 26.0–34.0)
MCHC: 31.8 g/dL (ref 30.0–36.0)
MCV: 95 fL (ref 80.0–100.0)
Monocytes Absolute: 0.5 10*3/uL (ref 0.1–1.0)
Monocytes Relative: 7 %
Neutro Abs: 5.4 10*3/uL (ref 1.7–7.7)
Neutrophils Relative %: 79 %
Platelets: 124 10*3/uL — ABNORMAL LOW (ref 150–400)
RBC: 4.21 MIL/uL — ABNORMAL LOW (ref 4.22–5.81)
RDW: 16.1 % — ABNORMAL HIGH (ref 11.5–15.5)
WBC: 6.8 10*3/uL (ref 4.0–10.5)
nRBC: 0 % (ref 0.0–0.2)

## 2024-07-02 LAB — COMPREHENSIVE METABOLIC PANEL WITH GFR
ALT: 14 U/L (ref 0–44)
AST: 18 U/L (ref 15–41)
Albumin: 3.6 g/dL (ref 3.5–5.0)
Alkaline Phosphatase: 171 U/L — ABNORMAL HIGH (ref 38–126)
Anion gap: 13 (ref 5–15)
BUN: 17 mg/dL (ref 8–23)
CO2: 24 mmol/L (ref 22–32)
Calcium: 8.5 mg/dL — ABNORMAL LOW (ref 8.9–10.3)
Chloride: 104 mmol/L (ref 98–111)
Creatinine, Ser: 1.3 mg/dL — ABNORMAL HIGH (ref 0.61–1.24)
GFR, Estimated: 54 mL/min — ABNORMAL LOW
Glucose, Bld: 91 mg/dL (ref 70–99)
Potassium: 4.4 mmol/L (ref 3.5–5.1)
Sodium: 141 mmol/L (ref 135–145)
Total Bilirubin: 0.6 mg/dL (ref 0.0–1.2)
Total Protein: 6.7 g/dL (ref 6.5–8.1)

## 2024-07-02 LAB — IRON AND TIBC
Iron: 31 ug/dL — ABNORMAL LOW (ref 45–182)
Saturation Ratios: 15 % — ABNORMAL LOW (ref 17.9–39.5)
TIBC: 217 ug/dL — ABNORMAL LOW (ref 250–450)
UIBC: 186 ug/dL

## 2024-07-02 LAB — FERRITIN: Ferritin: 556 ng/mL — ABNORMAL HIGH (ref 24–336)

## 2024-07-10 ENCOUNTER — Ambulatory Visit (HOSPITAL_COMMUNITY)

## 2024-07-13 ENCOUNTER — Inpatient Hospital Stay: Attending: Oncology | Admitting: Oncology
